# Patient Record
Sex: Male | Born: 1940 | Race: White | Hispanic: No | Smoking: Former smoker
Health system: Southern US, Community
[De-identification: ages and names within clinical notes are randomized; demographics above are authoritative.]

## PROBLEM LIST (undated history)

## (undated) DIAGNOSIS — M6281 Muscle weakness (generalized): Secondary | ICD-10-CM

## (undated) DIAGNOSIS — R05 Cough: Secondary | ICD-10-CM

## (undated) DIAGNOSIS — F32A Depression, unspecified: Secondary | ICD-10-CM

## (undated) DIAGNOSIS — K5900 Constipation, unspecified: Secondary | ICD-10-CM

## (undated) DIAGNOSIS — R1110 Vomiting, unspecified: Secondary | ICD-10-CM

## (undated) DIAGNOSIS — C801 Malignant (primary) neoplasm, unspecified: Secondary | ICD-10-CM

## (undated) DIAGNOSIS — R32 Unspecified urinary incontinence: Secondary | ICD-10-CM

## (undated) DIAGNOSIS — R062 Wheezing: Secondary | ICD-10-CM

## (undated) DIAGNOSIS — R059 Cough, unspecified: Secondary | ICD-10-CM

## (undated) DIAGNOSIS — I639 Cerebral infarction, unspecified: Secondary | ICD-10-CM

## (undated) DIAGNOSIS — I10 Essential (primary) hypertension: Secondary | ICD-10-CM

## (undated) DIAGNOSIS — E559 Vitamin D deficiency, unspecified: Secondary | ICD-10-CM

## (undated) DIAGNOSIS — K469 Unspecified abdominal hernia without obstruction or gangrene: Secondary | ICD-10-CM

## (undated) DIAGNOSIS — H9190 Unspecified hearing loss, unspecified ear: Secondary | ICD-10-CM

## (undated) DIAGNOSIS — R682 Dry mouth, unspecified: Secondary | ICD-10-CM

## (undated) DIAGNOSIS — F329 Major depressive disorder, single episode, unspecified: Secondary | ICD-10-CM

## (undated) HISTORY — PX: ABDOMINAL SURGERY: SHX537

---

## 2018-08-28 ENCOUNTER — Emergency Department (HOSPITAL_COMMUNITY): Payer: Medicare Other

## 2018-08-28 ENCOUNTER — Other Ambulatory Visit: Payer: Self-pay

## 2018-08-28 ENCOUNTER — Encounter (HOSPITAL_COMMUNITY): Payer: Self-pay | Admitting: Emergency Medicine

## 2018-08-28 ENCOUNTER — Inpatient Hospital Stay (HOSPITAL_COMMUNITY)
Admission: EM | Admit: 2018-08-28 | Discharge: 2018-09-13 | DRG: 335 | Disposition: A | Discharge status: Skilled Nursing Facility | Payer: Medicare Other | Attending: Internal Medicine | Admitting: Internal Medicine

## 2018-08-28 DIAGNOSIS — N179 Acute kidney failure, unspecified: Secondary | ICD-10-CM | POA: Diagnosis present

## 2018-08-28 DIAGNOSIS — Z85820 Personal history of malignant melanoma of skin: Secondary | ICD-10-CM | POA: Diagnosis not present

## 2018-08-28 DIAGNOSIS — K567 Ileus, unspecified: Secondary | ICD-10-CM | POA: Diagnosis not present

## 2018-08-28 DIAGNOSIS — K432 Incisional hernia without obstruction or gangrene: Secondary | ICD-10-CM | POA: Diagnosis present

## 2018-08-28 DIAGNOSIS — Z79899 Other long term (current) drug therapy: Secondary | ICD-10-CM

## 2018-08-28 DIAGNOSIS — M795 Residual foreign body in soft tissue: Secondary | ICD-10-CM

## 2018-08-28 DIAGNOSIS — K660 Peritoneal adhesions (postprocedural) (postinfection): Secondary | ICD-10-CM | POA: Diagnosis present

## 2018-08-28 DIAGNOSIS — Z923 Personal history of irradiation: Secondary | ICD-10-CM | POA: Diagnosis not present

## 2018-08-28 DIAGNOSIS — Z85818 Personal history of malignant neoplasm of other sites of lip, oral cavity, and pharynx: Secondary | ICD-10-CM | POA: Diagnosis not present

## 2018-08-28 DIAGNOSIS — Z8546 Personal history of malignant neoplasm of prostate: Secondary | ICD-10-CM

## 2018-08-28 DIAGNOSIS — Z87891 Personal history of nicotine dependence: Secondary | ICD-10-CM

## 2018-08-28 DIAGNOSIS — R1310 Dysphagia, unspecified: Secondary | ICD-10-CM | POA: Diagnosis present

## 2018-08-28 DIAGNOSIS — D649 Anemia, unspecified: Secondary | ICD-10-CM | POA: Diagnosis present

## 2018-08-28 DIAGNOSIS — Z4659 Encounter for fitting and adjustment of other gastrointestinal appliance and device: Secondary | ICD-10-CM

## 2018-08-28 DIAGNOSIS — Z7951 Long term (current) use of inhaled steroids: Secondary | ICD-10-CM

## 2018-08-28 DIAGNOSIS — R0902 Hypoxemia: Secondary | ICD-10-CM | POA: Diagnosis present

## 2018-08-28 DIAGNOSIS — Z66 Do not resuscitate: Secondary | ICD-10-CM | POA: Diagnosis present

## 2018-08-28 DIAGNOSIS — J440 Chronic obstructive pulmonary disease with acute lower respiratory infection: Secondary | ICD-10-CM | POA: Diagnosis present

## 2018-08-28 DIAGNOSIS — E860 Dehydration: Secondary | ICD-10-CM | POA: Diagnosis present

## 2018-08-28 DIAGNOSIS — Z87440 Personal history of urinary (tract) infections: Secondary | ICD-10-CM | POA: Diagnosis not present

## 2018-08-28 DIAGNOSIS — R062 Wheezing: Secondary | ICD-10-CM

## 2018-08-28 DIAGNOSIS — Z9079 Acquired absence of other genital organ(s): Secondary | ICD-10-CM

## 2018-08-28 DIAGNOSIS — D72829 Elevated white blood cell count, unspecified: Secondary | ICD-10-CM | POA: Diagnosis not present

## 2018-08-28 DIAGNOSIS — K56600 Partial intestinal obstruction, unspecified as to cause: Secondary | ICD-10-CM | POA: Diagnosis present

## 2018-08-28 DIAGNOSIS — J690 Pneumonitis due to inhalation of food and vomit: Secondary | ICD-10-CM | POA: Diagnosis present

## 2018-08-28 DIAGNOSIS — J209 Acute bronchitis, unspecified: Secondary | ICD-10-CM | POA: Diagnosis present

## 2018-08-28 DIAGNOSIS — H9190 Unspecified hearing loss, unspecified ear: Secondary | ICD-10-CM | POA: Diagnosis present

## 2018-08-28 DIAGNOSIS — R05 Cough: Secondary | ICD-10-CM

## 2018-08-28 DIAGNOSIS — K56609 Unspecified intestinal obstruction, unspecified as to partial versus complete obstruction: Secondary | ICD-10-CM | POA: Diagnosis present

## 2018-08-28 DIAGNOSIS — R059 Cough, unspecified: Secondary | ICD-10-CM

## 2018-08-28 DIAGNOSIS — J189 Pneumonia, unspecified organism: Secondary | ICD-10-CM | POA: Diagnosis not present

## 2018-08-28 DIAGNOSIS — I69354 Hemiplegia and hemiparesis following cerebral infarction affecting left non-dominant side: Secondary | ICD-10-CM

## 2018-08-28 DIAGNOSIS — I10 Essential (primary) hypertension: Secondary | ICD-10-CM | POA: Diagnosis present

## 2018-08-28 DIAGNOSIS — Z0189 Encounter for other specified special examinations: Secondary | ICD-10-CM

## 2018-08-28 DIAGNOSIS — Z7982 Long term (current) use of aspirin: Secondary | ICD-10-CM

## 2018-08-28 DIAGNOSIS — R109 Unspecified abdominal pain: Secondary | ICD-10-CM | POA: Diagnosis present

## 2018-08-28 HISTORY — DX: Unspecified urinary incontinence: R32

## 2018-08-28 HISTORY — DX: Cough: R05

## 2018-08-28 HISTORY — DX: Major depressive disorder, single episode, unspecified: F32.9

## 2018-08-28 HISTORY — DX: Cough, unspecified: R05.9

## 2018-08-28 HISTORY — DX: Cerebral infarction, unspecified: I63.9

## 2018-08-28 HISTORY — DX: Dry mouth, unspecified: R68.2

## 2018-08-28 HISTORY — DX: Malignant (primary) neoplasm, unspecified: C80.1

## 2018-08-28 HISTORY — DX: Wheezing: R06.2

## 2018-08-28 HISTORY — DX: Vomiting, unspecified: R11.10

## 2018-08-28 HISTORY — DX: Muscle weakness (generalized): M62.81

## 2018-08-28 HISTORY — DX: Vitamin D deficiency, unspecified: E55.9

## 2018-08-28 HISTORY — DX: Depression, unspecified: F32.A

## 2018-08-28 HISTORY — DX: Essential (primary) hypertension: I10

## 2018-08-28 HISTORY — DX: Unspecified hearing loss, unspecified ear: H91.90

## 2018-08-28 HISTORY — DX: Constipation, unspecified: K59.00

## 2018-08-28 HISTORY — DX: Unspecified abdominal hernia without obstruction or gangrene: K46.9

## 2018-08-28 LAB — COMPREHENSIVE METABOLIC PANEL
ALT: 13 U/L (ref 0–44)
ANION GAP: 7 (ref 5–15)
AST: 16 U/L (ref 15–41)
Albumin: 3.2 g/dL — ABNORMAL LOW (ref 3.5–5.0)
Alkaline Phosphatase: 76 U/L (ref 38–126)
BUN: 17 mg/dL (ref 8–23)
CO2: 25 mmol/L (ref 22–32)
CREATININE: 1.35 mg/dL — AB (ref 0.61–1.24)
Calcium: 9 mg/dL (ref 8.9–10.3)
Chloride: 104 mmol/L (ref 98–111)
GFR calc Af Amer: 59 mL/min — ABNORMAL LOW (ref >60)
GFR calc non Af Amer: 51 mL/min — ABNORMAL LOW (ref >60)
Glucose, Bld: 184 mg/dL — ABNORMAL HIGH (ref 70–99)
Potassium: 4.2 mmol/L (ref 3.5–5.1)
SODIUM: 136 mmol/L (ref 135–145)
Total Bilirubin: 0.8 mg/dL (ref 0.3–1.2)
Total Protein: 7.8 g/dL (ref 6.5–8.1)

## 2018-08-28 LAB — CBC WITH DIFFERENTIAL/PLATELET
Abs Immature Granulocytes: 0.07 10*3/uL (ref 0.00–0.07)
BASOS ABS: 0 10*3/uL (ref 0.0–0.1)
Basophils Relative: 0 %
EOS PCT: 0 %
Eosinophils Absolute: 0 10*3/uL (ref 0.0–0.5)
HCT: 41.5 % (ref 39.0–52.0)
Hemoglobin: 12.8 g/dL — ABNORMAL LOW (ref 13.0–17.0)
IMMATURE GRANULOCYTES: 0 %
LYMPHS PCT: 7 %
Lymphs Abs: 1.2 10*3/uL (ref 0.7–4.0)
MCH: 27.6 pg (ref 26.0–34.0)
MCHC: 30.8 g/dL (ref 30.0–36.0)
MCV: 89.6 fL (ref 80.0–100.0)
Monocytes Absolute: 0.7 10*3/uL (ref 0.1–1.0)
Monocytes Relative: 5 %
NEUTROS PCT: 88 %
NRBC: 0 % (ref 0.0–0.2)
Neutro Abs: 13.7 10*3/uL — ABNORMAL HIGH (ref 1.7–7.7)
Platelets: 346 10*3/uL (ref 150–400)
RBC: 4.63 MIL/uL (ref 4.22–5.81)
RDW: 14.1 % (ref 11.5–15.5)
WBC: 15.7 10*3/uL — AB (ref 4.0–10.5)

## 2018-08-28 LAB — LIPASE, BLOOD: LIPASE: 25 U/L (ref 11–51)

## 2018-08-28 LAB — I-STAT CG4 LACTIC ACID, ED: Lactic Acid, Venous: 1.86 mmol/L (ref 0.5–1.9)

## 2018-08-28 MED ORDER — SODIUM CHLORIDE 0.9 % IV SOLN
2.0000 g | INTRAVENOUS | Status: AC
  Administered 2018-08-28 – 2018-09-03 (×7): 2 g via INTRAVENOUS
  Filled 2018-08-28 (×7): qty 20

## 2018-08-28 MED ORDER — SODIUM CHLORIDE 0.9 % IV SOLN
500.0000 mg | INTRAVENOUS | Status: AC
  Administered 2018-08-28 – 2018-08-31 (×3): 500 mg via INTRAVENOUS
  Filled 2018-08-28 (×4): qty 500

## 2018-08-28 MED ORDER — ONDANSETRON HCL 4 MG/2ML IJ SOLN
4.0000 mg | Freq: Once | INTRAMUSCULAR | Status: AC
  Administered 2018-08-28: 4 mg via INTRAVENOUS
  Filled 2018-08-28: qty 2

## 2018-08-28 MED ORDER — SODIUM CHLORIDE 0.9 % IV BOLUS
1000.0000 mL | Freq: Once | INTRAVENOUS | Status: AC
  Administered 2018-08-28: 1000 mL via INTRAVENOUS

## 2018-08-28 MED ORDER — IOHEXOL 300 MG/ML  SOLN
100.0000 mL | Freq: Once | INTRAMUSCULAR | Status: AC | PRN
  Administered 2018-08-28: 100 mL via INTRAVENOUS

## 2018-08-28 MED ORDER — ALBUTEROL SULFATE (2.5 MG/3ML) 0.083% IN NEBU
5.0000 mg | INHALATION_SOLUTION | Freq: Once | RESPIRATORY_TRACT | Status: AC
  Administered 2018-08-28: 5 mg via RESPIRATORY_TRACT
  Filled 2018-08-28: qty 6

## 2018-08-28 MED ORDER — IPRATROPIUM BROMIDE 0.02 % IN SOLN
0.5000 mg | Freq: Once | RESPIRATORY_TRACT | Status: AC
  Administered 2018-08-28: 0.5 mg via RESPIRATORY_TRACT
  Filled 2018-08-28: qty 2.5

## 2018-08-28 NOTE — ED Triage Notes (Signed)
Pt to ED with c/o abd pain and shortness of breath.  EMS reports pt was coughing en route to ED and coughed until he vomited.  Pt was given a duo neb by EMS.

## 2018-08-28 NOTE — ED Provider Notes (Addendum)
[facility] EMERGENCY DEPARTMENT Provider Note   CSN: [id number] Arrival date & time: 08/28/18  2109     History   Chief Complaint Chief Complaint  Patient presents with  . Abdominal Pain  . Shortness of Breath    HPI Luis Lyons is a 77 y.o. male.  Patient c/o abdominal pain for the past couple days. Constant, gradual onset, dull, diffuse, non radiating, worse w palpation. Pt also noted w fever today, 101. Pt notes episodic, productive cough. No sore throat. Denies chest pain. Mild sob. Last bm a few days ago. Denies vomiting. No dysuria.   The history is provided by the patient and the EMS personnel.  Abdominal Pain   Associated symptoms include fever. Pertinent negatives include dysuria and headaches.  Shortness of Breath  Associated symptoms include a fever, cough and abdominal pain. Pertinent negatives include no headaches, no sore throat, no neck pain, no chest pain and no rash.    Past Medical History:  Diagnosis Date  . Abdominal hernia   . Cancer ([place])   . Constipation   . Cough   . Depression   . Dry mouth   . Hard of hearing   . Hypertension   . Muscle weakness   . Stroke ([place])   . Urinary incontinence   . Vitamin D deficiency   . Vomiting   . Wheezing     There are no active problems to display for this patient.   Past Surgical History:  Procedure Laterality Date  . ABDOMINAL SURGERY          Home Medications    Prior to Admission medications   Medication Sig Start Date End Date Taking? Authorizing Provider  acetaminophen (TYLENOL) 500 MG tablet Take 500 mg by mouth every 4 (four) hours as needed for mild pain.   Yes [provider]  albuterol (PROVENTIL) (2.5 MG/3ML) 0.083% nebulizer solution Take 2.5 mg by nebulization 2 (two) times daily as needed for wheezing or shortness of breath.   Yes [provider]  aspirin 81 MG chewable tablet Chew 81 mg by mouth daily.   Yes [provider]    chlorhexidine (PERIDEX) 0.12 % solution 15 mLs by Mouth Rinse route daily.   Yes [provider]  cholecalciferol (VITAMIN D3) 25 MCG (1000 UT) tablet Take 1,000 Units by mouth daily.   Yes [provider]  dextromethorphan (DELSYM) 30 MG/5ML liquid Take 60 mg by mouth 2 (two) times daily as needed for cough.   Yes [provider]  fluticasone (FLONASE) 50 MCG/ACT nasal spray Place 1 spray into both nostrils daily.   Yes [provider]  HYDROcodone-acetaminophen (NORCO/VICODIN) 5-325 MG tablet Take 1 tablet by mouth every 4 (four) hours as needed for moderate pain.   Yes [provider]  ibuprofen (ADVIL,MOTRIN) 200 MG tablet Take 400 mg by mouth every 6 (six) hours as needed for moderate pain.   Yes [provider]  ipratropium-albuterol (DUONEB) 0.5-2.5 (3) MG/3ML SOLN Take 3 mLs by nebulization 4 (four) times daily.    Yes [provider]  Lactobacillus (ACIDOPHILUS) CAPS capsule Take 2 capsules by mouth 2 (two) times daily.   Yes [provider]  loperamide (IMODIUM A-D) 2 MG tablet Take 2 mg by mouth 4 (four) times daily as needed for diarrhea or loose stools.   Yes [provider]  magnesium hydroxide (MILK OF MAGNESIA) 400 MG/5ML suspension Take 30 mLs by mouth daily as needed for mild constipation.  Yes [provider]  oxybutynin (DITROPAN-XL) 5 MG 24 hr tablet Take 5 mg by mouth at bedtime.   Yes [provider]  phenazopyridine (PYRIDIUM) 200 MG tablet Take 200 mg by mouth 3 (three) times daily as needed for pain.   Yes [provider]  polyethylene glycol (MIRALAX / GLYCOLAX) packet Take 17 g by mouth daily.   Yes [provider]  polyvinyl alcohol (LIQUIFILM TEARS) 1.4 % ophthalmic solution Place 1 drop into both eyes 2 (two) times daily.   Yes [provider]  senna (SENOKOT) 8.6 MG tablet Take 1 tablet by mouth 2 (two) times daily as needed for constipation.    Yes [provider]  sodium fluoride (DENTAGEL) 1.1 % GEL dental gel Place 1 application onto teeth 2 (two) times daily.   Yes [provider]  vitamin B-12 (CYANOCOBALAMIN) 1000 MCG tablet Take 1,000 mcg by mouth daily.   Yes [provider]  zinc oxide 20 % ointment Apply 1 application topically 2 (two) times daily as needed for irritation.   Yes [provider]    Family History No family history on file.  Social History Social History   Tobacco Use  . Smoking status: Former Research scientist (life sciences)  . Smokeless tobacco: Never Used  Substance Use Topics  . Alcohol use: Not Currently  . Drug use: Never     Allergies   Patient has no known allergies.   Review of Systems Review of Systems  Constitutional: Positive for fever.  HENT: Negative for sore throat.   Eyes: Negative for redness.  Respiratory: Positive for cough and shortness of breath.   Cardiovascular: Negative for chest pain.  Gastrointestinal: Positive for abdominal pain.  Endocrine: Negative for polyuria.  Genitourinary: Negative for dysuria and flank pain.  Musculoskeletal: Negative for back pain and neck pain.  Skin: Negative for rash.  Neurological: Negative for headaches.  Hematological: Does not bruise/bleed easily.  Psychiatric/Behavioral: Negative for confusion.     Physical Exam Updated Vital Signs BP (!) 164/71   Pulse (!) 107   Temp 99.1 F (37.3 C) (Oral)   Resp (!) 31   Ht 1.854 m (6\' 1" )   Wt 90.7 kg   SpO2 96%   BMI 26.39 kg/m   Physical Exam  Constitutional: He appears well-developed and well-nourished.  HENT:  Mouth/Throat: Oropharynx is clear and moist.  Eyes: Conjunctivae are normal.  Neck: Neck supple. No tracheal deviation present.  No stiffness or rigidity.   Cardiovascular: Normal rate, regular rhythm, normal heart sounds and intact distal pulses. Exam reveals no gallop and no friction rub.  No murmur heard. Pulmonary/Chest: Effort normal. No  accessory muscle usage. No respiratory distress.  Upper resp congestion. Non prod cough.   Abdominal: Soft.  abd soft, distended. Soft, reducible ventral hernia. Diffuse tenderness, mild-mod. No rebound/guarding.   Genitourinary:  Genitourinary Comments: No cva tenderness.   Musculoskeletal: He exhibits no edema.  Neurological: He is alert.  Skin: Skin is warm and dry. No rash noted.  Psychiatric: He has a normal mood and affect.  Nursing note and vitals reviewed.    ED Treatments / Results  Labs (all labs ordered are listed, but only abnormal results are displayed) Results for orders placed or performed during the hospital encounter of 08/28/18  Comprehensive metabolic panel  Result Value Ref Range   Sodium 136 135 - 145 mmol/L   Potassium 4.2 3.5 - 5.1 mmol/L   Chloride 104 98 - 111 mmol/L   CO2 25  22 - 32 mmol/L   Glucose, Bld 184 (H) 70 - 99 mg/dL   BUN 17 8 - 23 mg/dL   Creatinine, Ser 1.35 (H) 0.61 - 1.24 mg/dL   Calcium 9.0 8.9 - 10.3 mg/dL   Total Protein 7.8 6.5 - 8.1 g/dL   Albumin 3.2 (L) 3.5 - 5.0 g/dL   AST 16 15 - 41 U/L   ALT 13 0 - 44 U/L   Alkaline Phosphatase 76 38 - 126 U/L   Total Bilirubin 0.8 0.3 - 1.2 mg/dL   GFR calc non Af Amer 51 (L) >60 mL/min   GFR calc Af Amer 59 (L) >60 mL/min   Anion gap 7 5 - 15  CBC WITH DIFFERENTIAL  Result Value Ref Range   WBC 15.7 (H) 4.0 - 10.5 K/uL   RBC 4.63 4.22 - 5.81 MIL/uL   Hemoglobin 12.8 (L) 13.0 - 17.0 g/dL   HCT 41.5 39.0 - 52.0 %   MCV 89.6 80.0 - 100.0 fL   MCH 27.6 26.0 - 34.0 pg   MCHC 30.8 30.0 - 36.0 g/dL   RDW 14.1 11.5 - 15.5 %   Platelets 346 150 - 400 K/uL   nRBC 0.0 0.0 - 0.2 %   Neutrophils Relative % 88 %   Neutro Abs 13.7 (H) 1.7 - 7.7 K/uL   Lymphocytes Relative 7 %   Lymphs Abs 1.2 0.7 - 4.0 K/uL   Monocytes Relative 5 %   Monocytes Absolute 0.7 0.1 - 1.0 K/uL   Eosinophils Relative 0 %   Eosinophils Absolute 0.0 0.0 - 0.5 K/uL   Basophils Relative 0 %   Basophils Absolute 0.0  0.0 - 0.1 K/uL   Immature Granulocytes 0 %   Abs Immature Granulocytes 0.07 0.00 - 0.07 K/uL  Lipase, blood  Result Value Ref Range   Lipase 25 11 - 51 U/L  I-Stat CG4 Lactic Acid, ED  Result Value Ref Range   Lactic Acid, Venous 1.86 0.5 - 1.9 mmol/L   Ct Abdomen Pelvis W Contrast  Result Date: 08/28/2018 CLINICAL DATA:  Abdominal pain with shortness of breath EXAM: CT ABDOMEN AND PELVIS WITH CONTRAST TECHNIQUE: Multidetector CT imaging of the abdomen and pelvis was performed using the standard protocol following bolus administration of intravenous contrast. CONTRAST:  11mL OMNIPAQUE IOHEXOL 300 MG/ML  SOLN COMPARISON:  None. FINDINGS: LOWER CHEST: There is no basilar pleural or apical pericardial effusion. HEPATOBILIARY: The hepatic contours and density are normal. There is no intra- or extrahepatic biliary dilatation. There is cholelithiasis without acute inflammation. PANCREAS: The pancreatic parenchymal contours are normal and there is no ductal dilatation. There is no peripancreatic fluid collection. SPLEEN: Normal. ADRENALS/URINARY TRACT: --Adrenal glands: Normal. --Right kidney/ureter: Mild atrophy. No hydronephrosis. --Left kidney/ureter: Vascular calcifications and interpolar nonobstructive 3 mm nephrolithiasis. No hydronephrosis. --Urinary bladder: Mild bladder wall thickening, possibly due to underdistention. STOMACH/BOWEL: --Stomach/Duodenum: There is no hiatal hernia or other gastric abnormality. The duodenal course and caliber are normal. --Small bowel: There are multiple loops of dilated small bowel in the central abdomen, predominantly anteriorly. One of these loops is directly beneath the skin surface, extending through a diastatic defect of the anterior abdominal muscles. Suspected transition point is in the anterior low midline abdomen (coronal image 24 and sagittal image 70). --Colon: No focal abnormality. --Appendix: Not visualized. No right lower quadrant inflammation or free  fluid. VASCULAR/LYMPHATIC: Atherosclerotic calcification is present within the non-aneurysmal abdominal aorta, without hemodynamically significant stenosis. The portal vein, splenic vein, superior mesenteric vein  and IVC are patent. No abdominal or pelvic lymphadenopathy. REPRODUCTIVE: Normal prostate size with symmetric seminal vesicles. MUSCULOSKELETAL. Compression deformity of L2 is probably chronic. There is grade 1 L4-5 anterolisthesis secondary to facet arthrosis. OTHER: None. IMPRESSION: 1. Small bowel obstruction with suspected transition point in the low anterior midline abdomen. 2. Cholelithiasis without acute inflammation. 3. Nonobstructing left nephrolithiasis. Aortic Atherosclerosis (ICD10-I70.0). Electronically Signed   By: Ulyses Jarred M.D.   On: 08/28/2018 23:21   Dg Chest Port 1 View  Result Date: 08/28/2018 CLINICAL DATA:  77 y/o  M; abdominal pain and shortness of breath. EXAM: PORTABLE CHEST 1 VIEW COMPARISON:  None. FINDINGS: Normal cardiac silhouette. Aortic atherosclerosis with calcification. Perihilar and basilar reticular opacities. No consolidation. No pleural effusion or pneumothorax. No acute osseous abnormality is evident. IMPRESSION: Basilar reticular opacities may represent mild interstitial edema or atypical pneumonia. No consolidation. Electronically Signed   By: Kristine Garbe M.D.   On: 08/28/2018 21:55     EKG EKG Interpretation  Date/Time:  Tuesday August 28 2018 21:21:31 EST Ventricular Rate:  116 PR Interval:    QRS Duration: 103 QT Interval:  321 QTC Calculation: 446 R Axis:   -30 Text Interpretation:  Sinus tachycardia Nonspecific T wave abnormality No previous tracing Confirmed by Lajean Saver [phone]) on 08/28/2018 9:33:36 PM   Radiology Ct Abdomen Pelvis W Contrast  Result Date: 08/28/2018 CLINICAL DATA:  Abdominal pain with shortness of breath EXAM: CT ABDOMEN AND PELVIS WITH CONTRAST TECHNIQUE: Multidetector CT imaging of the  abdomen and pelvis was performed using the standard protocol following bolus administration of intravenous contrast. CONTRAST:  161mL OMNIPAQUE IOHEXOL 300 MG/ML  SOLN COMPARISON:  None. FINDINGS: LOWER CHEST: There is no basilar pleural or apical pericardial effusion. HEPATOBILIARY: The hepatic contours and density are normal. There is no intra- or extrahepatic biliary dilatation. There is cholelithiasis without acute inflammation. PANCREAS: The pancreatic parenchymal contours are normal and there is no ductal dilatation. There is no peripancreatic fluid collection. SPLEEN: Normal. ADRENALS/URINARY TRACT: --Adrenal glands: Normal. --Right kidney/ureter: Mild atrophy. No hydronephrosis. --Left kidney/ureter: Vascular calcifications and interpolar nonobstructive 3 mm nephrolithiasis. No hydronephrosis. --Urinary bladder: Mild bladder wall thickening, possibly due to underdistention. STOMACH/BOWEL: --Stomach/Duodenum: There is no hiatal hernia or other gastric abnormality. The duodenal course and caliber are normal. --Small bowel: There are multiple loops of dilated small bowel in the central abdomen, predominantly anteriorly. One of these loops is directly beneath the skin surface, extending through a diastatic defect of the anterior abdominal muscles. Suspected transition point is in the anterior low midline abdomen (coronal image 24 and sagittal image 70). --Colon: No focal abnormality. --Appendix: Not visualized. No right lower quadrant inflammation or free fluid. VASCULAR/LYMPHATIC: Atherosclerotic calcification is present within the non-aneurysmal abdominal aorta, without hemodynamically significant stenosis. The portal vein, splenic vein, superior mesenteric vein and IVC are patent. No abdominal or pelvic lymphadenopathy. REPRODUCTIVE: Normal prostate size with symmetric seminal vesicles. MUSCULOSKELETAL. Compression deformity of L2 is probably chronic. There is grade 1 L4-5 anterolisthesis secondary to facet  arthrosis. OTHER: None. IMPRESSION: 1. Small bowel obstruction with suspected transition point in the low anterior midline abdomen. 2. Cholelithiasis without acute inflammation. 3. Nonobstructing left nephrolithiasis. Aortic Atherosclerosis (ICD10-I70.0). Electronically Signed   By: Ulyses Jarred M.D.   On: 08/28/2018 23:21   Dg Chest Port 1 View  Result Date: 08/28/2018 CLINICAL DATA:  77 y/o  M; abdominal pain and shortness of breath. EXAM: PORTABLE CHEST 1 VIEW COMPARISON:  None. FINDINGS: Normal cardiac silhouette. Aortic atherosclerosis  with calcification. Perihilar and basilar reticular opacities. No consolidation. No pleural effusion or pneumothorax. No acute osseous abnormality is evident. IMPRESSION: Basilar reticular opacities may represent mild interstitial edema or atypical pneumonia. No consolidation. Electronically Signed   By: Kristine Garbe M.D.   On: 08/28/2018 21:55    Procedures Procedures (including critical care time)  Medications Ordered in ED Medications  cefTRIAXone (ROCEPHIN) 2 g in sodium chloride 0.9 % 100 mL IVPB (2 g Intravenous New Bag/Given 08/28/18 2231)  azithromycin (ZITHROMAX) 500 mg in sodium chloride 0.9 % 250 mL IVPB (has no administration in time range)  sodium chloride 0.9 % bolus 1,000 mL (has no administration in time range)  iohexol (OMNIPAQUE) 300 MG/ML solution 100 mL (100 mLs Intravenous Contrast Given 08/28/18 2249)     Initial Impression / Assessment and Plan / ED Course  I have reviewed the triage vital signs and the nursing notes.  Pertinent labs & imaging results that were available during my care of the patient were reviewed by me and considered in my medical decision making (see chart for details).  Iv ns bolus. zofran iv. Labs and cultures. Pcxr.   Ct.  Reviewed nursing notes and prior charts for additional history.   Labs reviewed - wbc elevated.   cxr reviewed - ?possible pna. Iv abx given.  Ct reviewed  - sbo. gen  surgery consulted.   bil wheezing on exam, hx same, alb and atrovent neb.   Hospitalists consulted for admission.  Discussed pt with surgery, Dr Barry Dienes - she will consult, requests ng and med service admission.  NG to LIWS.   CRITICAL CARE RE: acute small bowel obstruction requiring ivf, NG tube, emergent gen surg consultation, fever, pna.  Performed by: Mirna Mires Total critical care time: 35 minutes Critical care time was exclusive of separately billable procedures and treating other patients. Critical care was necessary to treat or prevent imminent or life-threatening deterioration. Critical care was time spent personally by me on the following activities: development of treatment plan with patient and/or surrogate as well as nursing, discussions with consultants, evaluation of patient's response to treatment, examination of patient, obtaining history from patient or surrogate, ordering and performing treatments and interventions, ordering and review of laboratory studies, ordering and review of radiographic studies, pulse oximetry and re-evaluation of patient's condition.   Hospitalist consulted for admission.     Final Clinical Impressions(s) / ED Diagnoses   Final diagnoses:  None    ED Discharge Orders    None                Lajean Saver, MD 09/24/18 1257

## 2018-08-28 NOTE — ED Notes (Signed)
Paged triad, Nicolina Hirt 23:37

## 2018-08-29 ENCOUNTER — Other Ambulatory Visit: Payer: Self-pay

## 2018-08-29 ENCOUNTER — Inpatient Hospital Stay (HOSPITAL_COMMUNITY): Payer: Medicare Other

## 2018-08-29 ENCOUNTER — Encounter (HOSPITAL_COMMUNITY): Payer: Self-pay | Admitting: Internal Medicine

## 2018-08-29 DIAGNOSIS — K56609 Unspecified intestinal obstruction, unspecified as to partial versus complete obstruction: Secondary | ICD-10-CM

## 2018-08-29 DIAGNOSIS — N179 Acute kidney failure, unspecified: Secondary | ICD-10-CM

## 2018-08-29 DIAGNOSIS — J189 Pneumonia, unspecified organism: Secondary | ICD-10-CM | POA: Diagnosis present

## 2018-08-29 DIAGNOSIS — J209 Acute bronchitis, unspecified: Secondary | ICD-10-CM | POA: Diagnosis present

## 2018-08-29 DIAGNOSIS — I10 Essential (primary) hypertension: Secondary | ICD-10-CM | POA: Diagnosis present

## 2018-08-29 LAB — HEPATIC FUNCTION PANEL
ALBUMIN: 3.1 g/dL — AB (ref 3.5–5.0)
ALT: 13 U/L (ref 0–44)
AST: 19 U/L (ref 15–41)
Alkaline Phosphatase: 71 U/L (ref 38–126)
BILIRUBIN DIRECT: 0.1 mg/dL (ref 0.0–0.2)
Indirect Bilirubin: 0.5 mg/dL (ref 0.3–0.9)
Total Bilirubin: 0.6 mg/dL (ref 0.3–1.2)
Total Protein: 7.5 g/dL (ref 6.5–8.1)

## 2018-08-29 LAB — CBC
HEMATOCRIT: 41.2 % (ref 39.0–52.0)
Hemoglobin: 13.1 g/dL (ref 13.0–17.0)
MCH: 28.3 pg (ref 26.0–34.0)
MCHC: 31.8 g/dL (ref 30.0–36.0)
MCV: 89 fL (ref 80.0–100.0)
NRBC: 0 % (ref 0.0–0.2)
PLATELETS: 315 10*3/uL (ref 150–400)
RBC: 4.63 MIL/uL (ref 4.22–5.81)
RDW: 14.1 % (ref 11.5–15.5)
WBC: 12.8 10*3/uL — AB (ref 4.0–10.5)

## 2018-08-29 LAB — BASIC METABOLIC PANEL
Anion gap: 8 (ref 5–15)
BUN: 18 mg/dL (ref 8–23)
CO2: 23 mmol/L (ref 22–32)
Calcium: 8.8 mg/dL — ABNORMAL LOW (ref 8.9–10.3)
Chloride: 106 mmol/L (ref 98–111)
Creatinine, Ser: 1.28 mg/dL — ABNORMAL HIGH (ref 0.61–1.24)
GFR calc non Af Amer: 54 mL/min — ABNORMAL LOW (ref >60)
Glucose, Bld: 155 mg/dL — ABNORMAL HIGH (ref 70–99)
POTASSIUM: 4.1 mmol/L (ref 3.5–5.1)
SODIUM: 137 mmol/L (ref 135–145)

## 2018-08-29 LAB — MRSA PCR SCREENING: MRSA by PCR: NEGATIVE

## 2018-08-29 MED ORDER — HYDRALAZINE HCL 20 MG/ML IJ SOLN
5.0000 mg | INTRAMUSCULAR | Status: DC | PRN
  Administered 2018-08-29 – 2018-09-05 (×12): 5 mg via INTRAVENOUS
  Filled 2018-08-29 (×13): qty 1

## 2018-08-29 MED ORDER — DIATRIZOATE MEGLUMINE & SODIUM 66-10 % PO SOLN
90.0000 mL | Freq: Once | ORAL | Status: AC
  Administered 2018-08-29: 90 mL via NASOGASTRIC
  Filled 2018-08-29: qty 90

## 2018-08-29 MED ORDER — IPRATROPIUM BROMIDE 0.02 % IN SOLN
0.5000 mg | Freq: Four times a day (QID) | RESPIRATORY_TRACT | Status: DC
  Administered 2018-08-29: 0.5 mg via RESPIRATORY_TRACT
  Filled 2018-08-29: qty 2.5

## 2018-08-29 MED ORDER — ORAL CARE MOUTH RINSE
15.0000 mL | Freq: Two times a day (BID) | OROMUCOSAL | Status: DC
  Administered 2018-08-29 – 2018-09-13 (×26): 15 mL via OROMUCOSAL

## 2018-08-29 MED ORDER — ONDANSETRON HCL 4 MG PO TABS
4.0000 mg | ORAL_TABLET | Freq: Four times a day (QID) | ORAL | Status: DC | PRN
  Administered 2018-08-29: 4 mg via ORAL

## 2018-08-29 MED ORDER — LIDOCAINE VISCOUS HCL 2 % MT SOLN
15.0000 mL | Freq: Once | OROMUCOSAL | Status: AC
  Administered 2018-08-29: 5 mL via OROMUCOSAL
  Filled 2018-08-29: qty 15

## 2018-08-29 MED ORDER — MORPHINE SULFATE (PF) 2 MG/ML IV SOLN
2.0000 mg | INTRAVENOUS | Status: DC | PRN
  Administered 2018-08-29 – 2018-09-04 (×2): 2 mg via INTRAVENOUS
  Filled 2018-08-29 (×2): qty 1

## 2018-08-29 MED ORDER — ALBUTEROL SULFATE (2.5 MG/3ML) 0.083% IN NEBU
2.5000 mg | INHALATION_SOLUTION | RESPIRATORY_TRACT | Status: DC | PRN
  Administered 2018-08-29: 2.5 mg via RESPIRATORY_TRACT
  Filled 2018-08-29: qty 3

## 2018-08-29 MED ORDER — ACETAMINOPHEN 325 MG PO TABS: 650.0000 mg | ORAL_TABLET | Freq: Four times a day (QID) | ORAL | Status: DC | PRN

## 2018-08-29 MED ORDER — SODIUM CHLORIDE 0.9 % IV SOLN
INTRAVENOUS | Status: DC
  Administered 2018-08-29: 02:00:00 via INTRAVENOUS

## 2018-08-29 MED ORDER — IPRATROPIUM-ALBUTEROL 0.5-2.5 (3) MG/3ML IN SOLN: 3.0000 mL | Freq: Four times a day (QID) | RESPIRATORY_TRACT | Status: DC

## 2018-08-29 MED ORDER — ONDANSETRON HCL 4 MG/2ML IJ SOLN
4.0000 mg | Freq: Four times a day (QID) | INTRAMUSCULAR | Status: DC | PRN
  Administered 2018-08-29 – 2018-09-06 (×6): 4 mg via INTRAVENOUS
  Filled 2018-08-29 (×9): qty 2

## 2018-08-29 MED ORDER — LIDOCAINE VISCOUS HCL 2 % MT SOLN
15.0000 mL | Freq: Once | OROMUCOSAL | Status: AC
  Administered 2018-08-29: 3 mL via OROMUCOSAL

## 2018-08-29 MED ORDER — ACETAMINOPHEN 650 MG RE SUPP
650.0000 mg | Freq: Four times a day (QID) | RECTAL | Status: DC | PRN
  Filled 2018-08-29: qty 1

## 2018-08-29 MED ORDER — IPRATROPIUM-ALBUTEROL 0.5-2.5 (3) MG/3ML IN SOLN
3.0000 mL | RESPIRATORY_TRACT | Status: DC
  Administered 2018-08-29 – 2018-08-30 (×6): 3 mL via RESPIRATORY_TRACT
  Filled 2018-08-29 (×6): qty 3

## 2018-08-29 MED ORDER — LIDOCAINE VISCOUS HCL 2 % MT SOLN
OROMUCOSAL | Status: AC
  Filled 2018-08-29: qty 15

## 2018-08-29 MED ORDER — ALBUTEROL SULFATE (2.5 MG/3ML) 0.083% IN NEBU
2.5000 mg | INHALATION_SOLUTION | Freq: Four times a day (QID) | RESPIRATORY_TRACT | Status: DC
  Administered 2018-08-29: 2.5 mg via RESPIRATORY_TRACT
  Filled 2018-08-29: qty 3

## 2018-08-29 MED ORDER — ENOXAPARIN SODIUM 40 MG/0.4ML ~~LOC~~ SOLN
40.0000 mg | SUBCUTANEOUS | Status: DC
  Administered 2018-08-29 – 2018-09-12 (×12): 40 mg via SUBCUTANEOUS
  Filled 2018-08-29 (×13): qty 0.4

## 2018-08-29 NOTE — Progress Notes (Addendum)
[facility] Progress Note     Subjective: CC:  C/o abdominal pain, worse over his hernia. Reports large amounts of flatus yesterday, none today. States his last BM was 11/25. Denies baseline constipation.   Objective: Vital signs in last 24 hours: Temp:  [98.1 F (36.7 C)-99.1 F (37.3 C)] 98.8 F (37.1 C) (11/27 0522) Pulse Rate:  [102-117] 112 (11/27 0637) Resp:  [22-34] 34 (11/27 0637) BP: (134-186)/(71-98) 186/92 (11/27 0637) SpO2:  [94 %-99 %] 95 % (11/27 8338) Weight:  [90.7 kg-98.2 kg] 98.2 kg (11/27 0126) Last BM Date: 08/26/18  Intake/Output from previous day: 11/26 0701 - 11/27 0700 In: 172.6 [I.V.:72.6; IV Piggyback:100] Out: 75 [Emesis/NG output:75] Intake/Output this shift: No intake/output data recorded.  PE: Gen:  Alert, NAD, pleasant HEENT: mandibular deformity from previous surgery, NG tube in place. Card:  Regular rate and rhythm, pedal pulses 2+ BL Pulm:  Slightly labored, wheezes bilaterally  Abd: Soft, diffusely TTP without peritonitis, pain is greatest over his ventral hernia which is soft and reducible without overlying skin changes, distended, bowel sounds present  NG - 0cc in cannister, bilious drainage in tube.  Skin: warm and dry, no rashes  Psych: A&Ox3   Lab Results:  Recent Labs    08/28/18 2142 08/29/18 0329  WBC 15.7* 12.8*  HGB 12.8* 13.1  HCT 41.5 41.2  PLT 346 315   BMET Recent Labs    08/28/18 2142 08/29/18 0329  NA 136 137  K 4.2 4.1  CL 104 106  CO2 25 23  GLUCOSE 184* 155*  BUN 17 18  CREATININE 1.35* 1.28*  CALCIUM 9.0 8.8*   PT/INR No results for input(s): LABPROT, INR in the last 72 hours. CMP     Component Value Date/Time   NA 137 08/29/2018 0329   K 4.1 08/29/2018 0329   CL 106 08/29/2018 0329   CO2 23 08/29/2018 0329   GLUCOSE 155 (H) 08/29/2018 0329   BUN 18 08/29/2018 0329   CREATININE 1.28 (H) 08/29/2018 0329   CALCIUM 8.8 (L) 08/29/2018 0329   PROT 7.5 08/29/2018 0329   ALBUMIN  3.1 (L) 08/29/2018 0329   AST 19 08/29/2018 0329   ALT 13 08/29/2018 0329   ALKPHOS 71 08/29/2018 0329   BILITOT 0.6 08/29/2018 0329   GFRNONAA 54 (L) 08/29/2018 0329   GFRAA >60 08/29/2018 0329   Lipase     Component Value Date/Time   LIPASE 25 08/28/2018 2142       Studies/Results: Ct Abdomen Pelvis W Contrast  Result Date: 08/28/2018 CLINICAL DATA:  Abdominal pain with shortness of breath EXAM: CT ABDOMEN AND PELVIS WITH CONTRAST TECHNIQUE: Multidetector CT imaging of the abdomen and pelvis was performed using the standard protocol following bolus administration of intravenous contrast. CONTRAST:  116mL OMNIPAQUE IOHEXOL 300 MG/ML  SOLN COMPARISON:  None. FINDINGS: LOWER CHEST: There is no basilar pleural or apical pericardial effusion. HEPATOBILIARY: The hepatic contours and density are normal. There is no intra- or extrahepatic biliary dilatation. There is cholelithiasis without acute inflammation. PANCREAS: The pancreatic parenchymal contours are normal and there is no ductal dilatation. There is no peripancreatic fluid collection. SPLEEN: Normal. ADRENALS/URINARY TRACT: --Adrenal glands: Normal. --Right kidney/ureter: Mild atrophy. No hydronephrosis. --Left kidney/ureter: Vascular calcifications and interpolar nonobstructive 3 mm nephrolithiasis. No hydronephrosis. --Urinary bladder: Mild bladder wall thickening, possibly due to underdistention. STOMACH/BOWEL: --Stomach/Duodenum: There is no hiatal hernia or other gastric abnormality. The duodenal course and caliber are normal. --Small bowel: There are multiple loops of dilated small  bowel in the central abdomen, predominantly anteriorly. One of these loops is directly beneath the skin surface, extending through a diastatic defect of the anterior abdominal muscles. Suspected transition point is in the anterior low midline abdomen (coronal image 24 and sagittal image 70). --Colon: No focal abnormality. --Appendix: Not visualized. No right  lower quadrant inflammation or free fluid. VASCULAR/LYMPHATIC: Atherosclerotic calcification is present within the non-aneurysmal abdominal aorta, without hemodynamically significant stenosis. The portal vein, splenic vein, superior mesenteric vein and IVC are patent. No abdominal or pelvic lymphadenopathy. REPRODUCTIVE: Normal prostate size with symmetric seminal vesicles. MUSCULOSKELETAL. Compression deformity of L2 is probably chronic. There is grade 1 L4-5 anterolisthesis secondary to facet arthrosis. OTHER: None. IMPRESSION: 1. Small bowel obstruction with suspected transition point in the low anterior midline abdomen. 2. Cholelithiasis without acute inflammation. 3. Nonobstructing left nephrolithiasis. Aortic Atherosclerosis (ICD10-I70.0). Electronically Signed   By: Ulyses Jarred M.D.   On: 08/28/2018 23:21   Dg Chest Port 1 View  Result Date: 08/28/2018 CLINICAL DATA:  77 y/o  M; abdominal pain and shortness of breath. EXAM: PORTABLE CHEST 1 VIEW COMPARISON:  None. FINDINGS: Normal cardiac silhouette. Aortic atherosclerosis with calcification. Perihilar and basilar reticular opacities. No consolidation. No pleural effusion or pneumothorax. No acute osseous abnormality is evident. IMPRESSION: Basilar reticular opacities may represent mild interstitial edema or atypical pneumonia. No consolidation. Electronically Signed   By: Kristine Garbe M.D.   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

## 2018-08-29 NOTE — Progress Notes (Signed)
Patient off unit for NG placement. Denies needs.

## 2018-08-29 NOTE — Progress Notes (Addendum)
PROGRESS NOTE        PATIENT DETAILS Name: Luis Lyons Age: 77 y.o. Sex: male Date of Birth: 1941/07/10 Admit Date: 08/28/2018 Admitting Physician [name], MD GYB:WLSLHT, [name], MD  Brief Narrative: 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  Upon further evaluation he was found to have pneumonia and a small bowel obstruction.  See below for further details  Subjective: Denies any abdominal pain-no chest pain or shortness of breath.  Passed flatus yesterday.  Claims last BM was several days back.  Assessment/Plan: SBO: Continue supportive care-n.p.o. status/NG tube decompression-General surgery following and planning on small bowel protocol later today.  Pneumonia: Could be aspiration pneumonia in the setting of vomiting-continue empiric antimicrobial therapy.  Is afebrile, and stable looking at this point.  AKI: Mild-likely hemodynamically mediated-improving with supportive care  History of CVA with chronic left-sided weakness: At baseline.  Resume aspirin once oral intake has been resumed.  History of prostate cancer-status post prostatectomy: Followed by urology in the outpatient setting  DVT Prophylaxis: Prophylactic Lovenox  Code Status: DNR  Family Communication: None at bedside  Disposition Plan: Remain inpatient-requires several more days of hospitalization before consideration of discharge.  Antimicrobial agents: Anti-infectives (From admission, onward)   Start     Dose/Rate Route Frequency Ordered Stop   08/28/18 2230  cefTRIAXone (ROCEPHIN) 2 g in sodium chloride 0.9 % 100 mL IVPB     2 g 200 mL/hr over 30 Minutes Intravenous Every 24 hours 08/28/18 2215     08/28/18 2230  azithromycin (ZITHROMAX) 500 mg in sodium chloride 0.9 %  250 mL IVPB     500 mg 250 mL/hr over 60 Minutes Intravenous Every 24 hours 08/28/18 2215        Procedures: None  CONSULTS:  general surgery  Time spent: 25- minutes-Greater than 50% of this time was spent in counseling, explanation of diagnosis, planning of further management, and coordination of care.  MEDICATIONS: Scheduled Meds: . diatrizoate meglumine-sodium  90 mL Per NG tube Once  . ipratropium-albuterol  3 mL Nebulization Q4H   Continuous Infusions: . sodium chloride 75 mL/hr at 08/29/18 0700  . azithromycin Stopped (08/29/18 0120)  . cefTRIAXone (ROCEPHIN)  IV Stopped (08/28/18 2301)   PRN Meds:.acetaminophen **OR** acetaminophen, albuterol, hydrALAZINE, morphine injection, ondansetron **OR** ondansetron (ZOFRAN) IV   PHYSICAL EXAM: Vital signs: Vitals:   08/29/18 0522 08/29/18 0637 08/29/18 0852 08/29/18 1147  BP: (!) 181/76 (!) 186/92    Pulse: (!) 110 (!) 112    Resp:  (!) 34    Temp: 98.8 F (37.1 C)     TempSrc: Oral     SpO2: 96% 95% 95% 92%  Weight:      Height:       Filed Weights   08/28/18 2130 08/29/18 0126  Weight: 90.7 kg 98.2 kg   Body mass index is 28.56 kg/m.   General appearance :Awake, alert, not in any distress.  He is to be dysarthric-at baseline Eyes:Pink conjunctiva Neck: supple, Resp:Good air entry bilaterally, no added sounds  CVS: S1 S2 regular, no murmurs.  GI: Joaquim Lai appears very sluggish-prior scars present-but nontender- nondistended  Extremities: B/L Lower Ext shows no edema, both legs are warm to  touch Musculoskeletal:No digital cyanosis Skin:No Rash, warm and dry Wounds:N/A  I have personally reviewed following labs and imaging studies  LABORATORY DATA: CBC: Recent Labs  Lab 08/28/18 2142 08/29/18 0329  WBC 15.7* 12.8*  NEUTROABS 13.7*  --   HGB 12.8* 13.1  HCT 41.5 41.2  MCV 89.6 89.0  PLT 346 272    Basic Metabolic Panel: Recent Labs  Lab 08/28/18 2142 08/29/18 0329  NA 136 137  K 4.2 4.1  CL  104 106  CO2 25 23  GLUCOSE 184* 155*  BUN 17 18  CREATININE 1.35* 1.28*  CALCIUM 9.0 8.8*    GFR: Estimated Creatinine Clearance: 60.6 mL/min (A) (by C-G formula based on SCr of 1.28 mg/dL (H)).  Liver Function Tests: Recent Labs  Lab 08/28/18 2142 08/29/18 0329  AST 16 19  ALT 13 13  ALKPHOS 76 71  BILITOT 0.8 0.6  PROT 7.8 7.5  ALBUMIN 3.2* 3.1*   Recent Labs  Lab 08/28/18 2142  LIPASE 25   No results for input(s): AMMONIA in the last 168 hours.  Coagulation Profile: No results for input(s): INR, PROTIME in the last 168 hours.  Cardiac Enzymes: No results for input(s): CKTOTAL, CKMB, CKMBINDEX, TROPONINI in the last 168 hours.  BNP (last 3 results) No results for input(s): PROBNP in the last 8760 hours.  HbA1C: No results for input(s): HGBA1C in the last 72 hours.  CBG: No results for input(s): GLUCAP in the last 168 hours.  Lipid Profile: No results for input(s): CHOL, HDL, LDLCALC, TRIG, CHOLHDL, LDLDIRECT in the last 72 hours.  Thyroid Function Tests: No results for input(s): TSH, T4TOTAL, FREET4, T3FREE, THYROIDAB in the last 72 hours.  Anemia Panel: No results for input(s): VITAMINB12, FOLATE, FERRITIN, TIBC, IRON, RETICCTPCT in the last 72 hours.  Urine analysis: No results found for: COLORURINE, APPEARANCEUR, LABSPEC, PHURINE, GLUCOSEU, HGBUR, BILIRUBINUR, KETONESUR, [place], UROBILINOGEN, NITRITE, LEUKOCYTESUR  Sepsis Labs: Lactic Acid, Venous    Component Value Date/Time   LATICACIDVEN 1.86 08/28/2018 2153    MICROBIOLOGY: Recent Results (from the past 240 hour(s))  Blood Culture (routine x 2)     Status: None (Preliminary result)   Collection Time: 08/28/18  9:50 PM  Result Value Ref Range Status   Specimen Description BLOOD LEFT FOREARM  Final   Special Requests   Final    BOTTLES DRAWN AEROBIC AND ANAEROBIC Blood Culture adequate volume   Culture   Final    NO GROWTH < 12 HOURS Performed at [facility], [place] [address], [place], [place] [address]    Report Status PENDING  Incomplete  Blood Culture (routine x 2)     Status: None (Preliminary result)   Collection Time: 08/28/18 10:00 PM  Result Value Ref Range Status   Specimen Description BLOOD RIGHT HAND  Final   Special Requests   Final    BOTTLES DRAWN AEROBIC AND ANAEROBIC Blood Culture adequate volume   Culture   Final    NO GROWTH < 12 HOURS Performed at [facility], [place] [address] [place], [place] [address]    Report Status PENDING  Incomplete  MRSA PCR Screening     Status: None   Collection Time: 08/29/18  2:00 AM  Result Value Ref Range Status   MRSA by PCR NEGATIVE NEGATIVE Final    Comment:        The GeneXpert MRSA Assay (FDA approved for NASAL specimens only), is one component of a comprehensive MRSA colonization surveillance program.  It is not intended to diagnose MRSA infection nor to guide or monitor treatment for MRSA infections. Performed at [facility], [place] [address], [place],  [address]     RADIOLOGY STUDIES/RESULTS: Dg Abd 1 View  Result Date: 08/29/2018 CLINICAL DATA:  Check nasogastric catheter placement EXAM: ABDOMEN - 1 VIEW COMPARISON:  None. FINDINGS: 30 seconds of fluoroscopy was utilized for placement. The nasogastric catheter is shown coiled within the gastric lumen. IMPRESSION: Nasogastric catheter placed in the stomach. Electronically Signed   By: Inez Catalina M.D.   On: 08/29/2018 09:08   Ct Abdomen Pelvis W Contrast  Result Date: 08/28/2018 CLINICAL DATA:  Abdominal pain with shortness of breath EXAM: CT ABDOMEN AND PELVIS WITH CONTRAST TECHNIQUE: Multidetector CT imaging of the abdomen and pelvis was performed using the standard protocol following bolus administration of intravenous contrast. CONTRAST:  164mL OMNIPAQUE IOHEXOL 300 MG/ML  SOLN COMPARISON:  None. FINDINGS: LOWER CHEST: There is no basilar pleural or apical pericardial effusion. HEPATOBILIARY: The hepatic contours and  density are normal. There is no intra- or extrahepatic biliary dilatation. There is cholelithiasis without acute inflammation. PANCREAS: The pancreatic parenchymal contours are normal and there is no ductal dilatation. There is no peripancreatic fluid collection. SPLEEN: Normal. ADRENALS/URINARY TRACT: --Adrenal glands: Normal. --Right kidney/ureter: Mild atrophy. No hydronephrosis. --Left kidney/ureter: Vascular calcifications and interpolar nonobstructive 3 mm nephrolithiasis. No hydronephrosis. --Urinary bladder: Mild bladder wall thickening, possibly due to underdistention. STOMACH/BOWEL: --Stomach/Duodenum: There is no hiatal hernia or other gastric abnormality. The duodenal course and caliber are normal. --Small bowel: There are multiple loops of dilated small bowel in the central abdomen, predominantly anteriorly. One of these loops is directly beneath the skin surface, extending through a diastatic defect of the anterior abdominal muscles. Suspected transition point is in the anterior low midline abdomen (coronal image 24 and sagittal image 70). --Colon: No focal abnormality. --Appendix: Not visualized. No right lower quadrant inflammation or free fluid. VASCULAR/LYMPHATIC: Atherosclerotic calcification is present within the non-aneurysmal abdominal aorta, without hemodynamically significant stenosis. The portal vein, splenic vein, superior mesenteric vein and IVC are patent. No abdominal or pelvic lymphadenopathy. REPRODUCTIVE: Normal prostate size with symmetric seminal vesicles. MUSCULOSKELETAL. Compression deformity of L2 is probably chronic. There is grade 1 L4-5 anterolisthesis secondary to facet arthrosis. OTHER: None. IMPRESSION: 1. Small bowel obstruction with suspected transition point in the low anterior midline abdomen. 2. Cholelithiasis without acute inflammation. 3. Nonobstructing left nephrolithiasis. Aortic Atherosclerosis (ICD10-I70.0). Electronically Signed   By: Ulyses Jarred M.D.   On:  08/28/2018 23:21   Dg Chest Port 1 View  Result Date: 08/28/2018 CLINICAL DATA:  77 y/o  M; abdominal pain and shortness of breath. EXAM: PORTABLE CHEST 1 VIEW COMPARISON:  None. FINDINGS: Normal cardiac silhouette. Aortic atherosclerosis with calcification. Perihilar and basilar reticular opacities. No consolidation. No pleural effusion or pneumothorax. No acute osseous abnormality is evident. IMPRESSION: Basilar reticular opacities may represent mild interstitial edema or atypical pneumonia. No consolidation. Electronically Signed   By: Kristine Garbe M.D.   On: 08/28/2018 21:55   Dg Abd Portable 1v-small Bowel Obstruction Protocol-initial, 8 Hr Delay  Result Date: 08/29/2018 CLINICAL DATA:  Follow up small bowel obstruction EXAM: PORTABLE ABDOMEN - 1 VIEW COMPARISON:  08/28/2018 FINDINGS: Nasogastric catheter is now noted within the stomach. Fecal material is noted throughout the colon. Contrast is seen within the bladder from recent CT examination. Multiple dilated small bowel loops are again identified in the mid abdomen stable from the recent exam. No acute bony  abnormality is noted. Prior L2 compression deformity is noted. IMPRESSION: Stable small bowel dilatation following nasogastric catheter placement Electronically Signed   By: Inez Catalina M.D.   On: 08/29/2018 09:09     LOS: 1 day   Oren Binet, MD  [facility]  If 7PM-7AM, please contact night-coverage  Please page via [email or web address] [email or web address]-click on MD name and type text message  08/29/2018, 1:04 PM

## 2018-08-29 NOTE — H&P (Signed)
History and Physical    Luis Lyons UXN:[id number] DOB: [birth year] DOA: 08/28/2018  PCP: Elson Clan, MD  Patient coming from: Home.  Chief Complaint: Abdominal pain shortness of breath.  HPI: Luis Lyons is a 77 y.o. male with history of CVA with left-sided weakness, prostate cancer, mouth cancer status post surgery and radiation many years ago during which patient also required tracheostomy PEG tube feeds and previous history of ruptured appendicitis requiring exploratory laparotomy with ventral hernia presents to the ER after patient has been having diffuse abdominal pain with shortness of breath some subjective feeling of fever chills and productive cough for last 2 to 3 days.  Last bowel movement was more than 2 days ago.  ED Course: In the ER patient is found to be febrile hypoxic.  Chest x-ray showing infiltrates and CT abdomen and pelvis done shows small bowel obstruction with transition point and gallbladder stones.  Patient was started on empiric antibiotics for pneumonia NG tube was attempted to be placed for small bowel obstruction but due to patient's previous surgery involving the mouth it was difficult to pass.  General surgery has been consulted and patient admitted for small bowel obstruction and pneumonia.  On my exam patient was wheezing for which patient is receiving nebulizer.   Review of Systems: As per HPI, rest all negative.   Past Medical History:  Diagnosis Date  . Abdominal hernia   . Cancer ([place])   . Constipation   . Cough   . Depression   . Dry mouth   . Hard of hearing   . Hypertension   . Muscle weakness   . Stroke ([place])   . Urinary incontinence   . Vitamin D deficiency   . Vomiting   . Wheezing     Past Surgical History:  Procedure Laterality Date  . ABDOMINAL SURGERY       reports that he has quit smoking. He has never used smokeless tobacco. He reports that he drank alcohol. He reports that he does not use drugs.  No Known  Allergies  Family History  Family history unknown: Yes    Prior to Admission medications   Medication Sig Start Date End Date Taking? Authorizing Provider  acetaminophen (TYLENOL) 500 MG tablet Take 500 mg by mouth every 4 (four) hours as needed for mild pain.   Yes [provider]  albuterol (PROVENTIL) (2.5 MG/3ML) 0.083% nebulizer solution Take 2.5 mg by nebulization 2 (two) times daily as needed for wheezing or shortness of breath.   Yes [provider]  aspirin 81 MG chewable tablet Chew 81 mg by mouth daily.   Yes [provider]  chlorhexidine (PERIDEX) 0.12 % solution 15 mLs by Mouth Rinse route daily.   Yes [provider]  cholecalciferol (VITAMIN D3) 25 MCG (1000 UT) tablet Take 1,000 Units by mouth daily.   Yes [provider]  dextromethorphan (DELSYM) 30 MG/5ML liquid Take 60 mg by mouth 2 (two) times daily as needed for cough.   Yes [provider]  fluticasone (FLONASE) 50 MCG/ACT nasal spray Place 1 spray into both nostrils daily.   Yes [provider]  HYDROcodone-acetaminophen (NORCO/VICODIN) 5-325 MG tablet Take 1 tablet by mouth every 4 (four) hours as needed for moderate pain.   Yes [provider]  ibuprofen (ADVIL,MOTRIN) 200 MG tablet Take 400 mg by mouth every 6 (six) hours as needed for moderate pain.   Yes [provider]  ipratropium-albuterol (DUONEB) 0.5-2.5 (3) MG/3ML SOLN Take 3  mLs by nebulization 4 (four) times daily.    Yes [provider]  Lactobacillus (ACIDOPHILUS) CAPS capsule Take 2 capsules by mouth 2 (two) times daily.   Yes [provider]  loperamide (IMODIUM A-D) 2 MG tablet Take 2 mg by mouth 4 (four) times daily as needed for diarrhea or loose stools.   Yes [provider]  magnesium hydroxide (MILK OF MAGNESIA) 400 MG/5ML suspension Take 30 mLs by mouth daily as needed for mild constipation.   Yes [provider]  oxybutynin  (DITROPAN-XL) 5 MG 24 hr tablet Take 5 mg by mouth at bedtime.   Yes [provider]  phenazopyridine (PYRIDIUM) 200 MG tablet Take 200 mg by mouth 3 (three) times daily as needed for pain.   Yes [provider]  polyethylene glycol (MIRALAX / GLYCOLAX) packet Take 17 g by mouth daily.   Yes [provider]  polyvinyl alcohol (LIQUIFILM TEARS) 1.4 % ophthalmic solution Place 1 drop into both eyes 2 (two) times daily.   Yes [provider]  senna (SENOKOT) 8.6 MG tablet Take 1 tablet by mouth 2 (two) times daily as needed for constipation.   Yes [provider]  sodium fluoride (DENTAGEL) 1.1 % GEL dental gel Place 1 application onto teeth 2 (two) times daily.   Yes [provider]  vitamin B-12 (CYANOCOBALAMIN) 1000 MCG tablet Take 1,000 mcg by mouth daily.   Yes [provider]  zinc oxide 20 % ointment Apply 1 application topically 2 (two) times daily as needed for irritation.   Yes [provider]    Physical Exam: Vitals:   08/28/18 2115 08/28/18 2130 08/28/18 2245 08/28/18 2313  BP: 134/81  (!) 179/96 (!) 164/71  Pulse: (!) 116  (!) 108 (!) 107  Resp: (!) 28  (!) 27 (!) 31  Temp: 99.1 F (37.3 C)     TempSrc: Oral     SpO2: 94%  95% 96%  Weight:  90.7 kg    Height:  6\' 1"  (1.854 m)        Constitutional: Moderately built and nourished. Vitals:   08/28/18 2115 08/28/18 2130 08/28/18 2245 08/28/18 2313  BP: 134/81  (!) 179/96 (!) 164/71  Pulse: (!) 116  (!) 108 (!) 107  Resp: (!) 28  (!) 27 (!) 31  Temp: 99.1 F (37.3 C)     TempSrc: Oral     SpO2: 94%  95% 96%  Weight:  90.7 kg    Height:  6\' 1"  (1.854 m)     Eyes: Anicteric no pallor. ENMT: No discharge from the ears eyes nose or mouth. Neck: No mass or.  No neck rigidity.  No JVD appreciated. Respiratory: Bilateral expiratory wheeze and no crepitations. Cardiovascular: S1-S2 heard no murmurs appreciated. Abdomen: Distended ventral hernia seen.   No rebound tenderness or guarding.  Bowel sounds not appreciated. Musculoskeletal: No edema. Skin: No rash. Neurologic: Alert awake oriented to time place and person.  Moves all extremities. Psychiatric: Appears normal.  Normal affect.   Labs on Admission: I have personally reviewed following labs and imaging studies  CBC: Recent Labs  Lab 08/28/18 2142  WBC 15.7*  NEUTROABS 13.7*  HGB 12.8*  HCT 41.5  MCV 89.6  PLT 161   Basic Metabolic Panel: Recent Labs  Lab 08/28/18 2142  NA 136  K 4.2  CL 104  CO2 25  GLUCOSE 184*  BUN 17  CREATININE 1.35*  CALCIUM 9.0   GFR: Estimated Creatinine Clearance: 52.6  mL/min (A) (by C-G formula based on SCr of 1.35 mg/dL (H)). Liver Function Tests: Recent Labs  Lab 08/28/18 2142  AST 16  ALT 13  ALKPHOS 76  BILITOT 0.8  PROT 7.8  ALBUMIN 3.2*   Recent Labs  Lab 08/28/18 2142  LIPASE 25   No results for input(s): AMMONIA in the last 168 hours. Coagulation Profile: No results for input(s): INR, PROTIME in the last 168 hours. Cardiac Enzymes: No results for input(s): CKTOTAL, CKMB, CKMBINDEX, TROPONINI in the last 168 hours. BNP (last 3 results) No results for input(s): PROBNP in the last 8760 hours. HbA1C: No results for input(s): HGBA1C in the last 72 hours. CBG: No results for input(s): GLUCAP in the last 168 hours. Lipid Profile: No results for input(s): CHOL, HDL, LDLCALC, TRIG, CHOLHDL, LDLDIRECT in the last 72 hours. Thyroid Function Tests: No results for input(s): TSH, T4TOTAL, FREET4, T3FREE, THYROIDAB in the last 72 hours. Anemia Panel: No results for input(s): VITAMINB12, FOLATE, FERRITIN, TIBC, IRON, RETICCTPCT in the last 72 hours. Urine analysis: No results found for: COLORURINE, APPEARANCEUR, LABSPEC, PHURINE, GLUCOSEU, HGBUR, BILIRUBINUR, KETONESUR, PROTEINUR, UROBILINOGEN, NITRITE, LEUKOCYTESUR Sepsis Labs: @LABRCNTIP (procalcitonin:4,lacticidven:4) )No results found for this or any previous visit  (from the past 240 hour(s)).   Radiological Exams on Admission: Ct Abdomen Pelvis W Contrast  Result Date: 08/28/2018 CLINICAL DATA:  Abdominal pain with shortness of breath EXAM: CT ABDOMEN AND PELVIS WITH CONTRAST TECHNIQUE: Multidetector CT imaging of the abdomen and pelvis was performed using the standard protocol following bolus administration of intravenous contrast. CONTRAST:  164mL OMNIPAQUE IOHEXOL 300 MG/ML  SOLN COMPARISON:  None. FINDINGS: LOWER CHEST: There is no basilar pleural or apical pericardial effusion. HEPATOBILIARY: The hepatic contours and density are normal. There is no intra- or extrahepatic biliary dilatation. There is cholelithiasis without acute inflammation. PANCREAS: The pancreatic parenchymal contours are normal and there is no ductal dilatation. There is no peripancreatic fluid collection. SPLEEN: Normal. ADRENALS/URINARY TRACT: --Adrenal glands: Normal. --Right kidney/ureter: Mild atrophy. No hydronephrosis. --Left kidney/ureter: Vascular calcifications and interpolar nonobstructive 3 mm nephrolithiasis. No hydronephrosis. --Urinary bladder: Mild bladder wall thickening, possibly due to underdistention. STOMACH/BOWEL: --Stomach/Duodenum: There is no hiatal hernia or other gastric abnormality. The duodenal course and caliber are normal. --Small bowel: There are multiple loops of dilated small bowel in the central abdomen, predominantly anteriorly. One of these loops is directly beneath the skin surface, extending through a diastatic defect of the anterior abdominal muscles. Suspected transition point is in the anterior low midline abdomen (coronal image 24 and sagittal image 70). --Colon: No focal abnormality. --Appendix: Not visualized. No right lower quadrant inflammation or free fluid. VASCULAR/LYMPHATIC: Atherosclerotic calcification is present within the non-aneurysmal abdominal aorta, without hemodynamically significant stenosis. The portal vein, splenic vein, superior  mesenteric vein and IVC are patent. No abdominal or pelvic lymphadenopathy. REPRODUCTIVE: Normal prostate size with symmetric seminal vesicles. MUSCULOSKELETAL. Compression deformity of L2 is probably chronic. There is grade 1 L4-5 anterolisthesis secondary to facet arthrosis. OTHER: None. IMPRESSION: 1. Small bowel obstruction with suspected transition point in the low anterior midline abdomen. 2. Cholelithiasis without acute inflammation. 3. Nonobstructing left nephrolithiasis. Aortic Atherosclerosis (ICD10-I70.0). Electronically Signed   By: Ulyses Jarred M.D.   On: 08/28/2018 23:21   Dg Chest Port 1 View  Result Date: 08/28/2018 CLINICAL DATA:  77 y/o  M; abdominal pain and shortness of breath. EXAM: PORTABLE CHEST 1 VIEW COMPARISON:  None. FINDINGS: Normal cardiac silhouette. Aortic atherosclerosis with calcification. Perihilar and basilar reticular opacities. No consolidation. No  pleural effusion or pneumothorax. No acute osseous abnormality is evident. IMPRESSION: Basilar reticular opacities may represent mild interstitial edema or atypical pneumonia. No consolidation. Electronically Signed   By: Kristine Garbe M.D.   On: 08/28/2018 21:55    EKG: Independently reviewed.  Normal sinus rhythm.  Assessment/Plan Principal Problem:   SBO (small bowel obstruction) (HCC) Active Problems:   CAP (community acquired pneumonia)   Essential hypertension   Acute bronchitis    1. Small bowel obstruction -discussed with Dr. Barry Dienes on-call general surgeon.  At this time it was difficult to place NG tube and surgery has consulted fluoroscopic guidance NG tube placement.  Repeat KUB in the morning.  IV fluids n.p.o. pain relief medications.  Follow metabolic panel intake output. 2. Possible pneumonia with bronchitis likely could be aspiration on empiric antibiotics for now.  Follow cultures.  On nebulizer treatment for wheezing. 3. Elevated blood pressure possibly from hypertension have placed  patient on PRN IV hydralazine follow blood pressure trends. 4. Previous history of alcohol abuse but has not had any alcohol many years. 5. History of prostate cancer and mouth cancer. 6. Previous history of stroke with left-sided weakness. 7. Anemia -follow CBC. 8. Renal failure probably acute from vomiting.  Follow metabolic panel.   DVT prophylaxis: SCDs. Code Status: DO NOT RESUSCITATE. Family Communication: Patient's daughter. Disposition Plan: Home. Consults called: General surgery. Admission status: Inpatient.   [name] MD [facility] Pager [phone].  If 7PM-7AM, please contact night-coverage [email or web address] Password [facility]  08/29/2018, 12:26 AM

## 2018-08-29 NOTE — ED Notes (Signed)
RN attempted to place NG tube, tube coiled in pt's mouth. Pt requested to take out. RN will attempt again after neb tx

## 2018-08-29 NOTE — Consult Note (Signed)
Reason for Consult:SBO Referring Physician: [name] is an 77 y.o. male.  HPI:  Pt is a 77 yo M with numerous medical problems who presented with 18 hours or so of cough, low grade fever, abdominal pain, nausea and vomiting.  The patient has a history of mouth cancer requiring surgery and radiation as well as trach which has since been removed.  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 He has frequent UTIs as well.  Additionally he has history of melanoma and prostate cancer.    He has known ventral hernia and has been seen at [facility].  He had a large ex lap from severe ruptured appendicitis and hernia has been slowly enlarging with chronic cough.    He does not recall passing gas for over 14 hours and hasn't had a BM for several days.  He does trend toward constipation and has to be "cleaned out" occasionally.    Past Medical History:  Diagnosis Date  . Abdominal hernia   . Cancer ([place])   . Constipation   . Cough   . Depression   . Dry mouth   . Hard of hearing   . Hypertension   . Muscle weakness   . Stroke ([place])   . Urinary incontinence   . Vitamin D deficiency   . Vomiting   . Wheezing     Past Surgical History:  Procedure Laterality Date  . ABDOMINAL SURGERY      Family History  Family history unknown: Yes    Social History:  reports that he has quit smoking. He has never used smokeless tobacco. He reports that he drank alcohol. He reports that he does not use drugs.  Allergies: No Known Allergies  Medications:  Prior to Admission:  Medications Prior to Admission  Medication Sig Dispense Refill Last Dose  . acetaminophen (TYLENOL) 500 MG tablet Take 500 mg by mouth every 4 (four) hours as needed for mild pain.   05/10/18  . albuterol (PROVENTIL) (2.5 MG/3ML) 0.083% nebulizer solution Take 2.5 mg by nebulization 2 (two) times daily as needed for wheezing or shortness of breath.   06/28/18  . aspirin 81 MG chewable  tablet Chew 81 mg by mouth daily.   08/28/2018 at 0945  . chlorhexidine (PERIDEX) 0.12 % solution 15 mLs by Mouth Rinse route daily.   08/28/2018 at Unknown time  . cholecalciferol (VITAMIN D3) 25 MCG (1000 UT) tablet Take 1,000 Units by mouth daily.   08/28/2018 at Unknown time  . dextromethorphan (DELSYM) 30 MG/5ML liquid Take 60 mg by mouth 2 (two) times daily as needed for cough.   Past Month at Unknown time  . fluticasone (FLONASE) 50 MCG/ACT nasal spray Place 1 spray into both nostrils daily.   08/28/2018 at Unknown time  . HYDROcodone-acetaminophen (NORCO/VICODIN) 5-325 MG tablet Take 1 tablet by mouth every 4 (four) hours as needed for moderate pain.   08/28/2018 at Unknown time  . ibuprofen (ADVIL,MOTRIN) 200 MG tablet Take 400 mg by mouth every 6 (six) hours as needed for moderate pain.   Past Month at Unknown time  . ipratropium-albuterol (DUONEB) 0.5-2.5 (3) MG/3ML SOLN Take 3 mLs by nebulization 4 (four) times daily.    08/28/2018 at Unknown time  . Lactobacillus (ACIDOPHILUS) CAPS capsule Take 2 capsules by mouth 2 (two) times daily.   08/28/2018 at Unknown time  . loperamide (IMODIUM A-D) 2 MG tablet Take 2 mg by mouth 4 (four)  times daily as needed for diarrhea or loose stools.   05/31/18  . magnesium hydroxide (MILK OF MAGNESIA) 400 MG/5ML suspension Take 30 mLs by mouth daily as needed for mild constipation.   01/31/18  . oxybutynin (DITROPAN-XL) 5 MG 24 hr tablet Take 5 mg by mouth at bedtime.   08/28/2018 at Unknown time  . phenazopyridine (PYRIDIUM) 200 MG tablet Take 200 mg by mouth 3 (three) times daily as needed for pain.   Past Month at Unknown time  . polyethylene glycol (MIRALAX / GLYCOLAX) packet Take 17 g by mouth daily.   08/28/2018 at Unknown time  . polyvinyl alcohol (LIQUIFILM TEARS) 1.4 % ophthalmic solution Place 1 drop into both eyes 2 (two) times daily.   08/28/2018 at Unknown time  . senna (SENOKOT) 8.6 MG tablet Take 1 tablet by mouth 2 (two) times daily as needed  for constipation.   06/04/18  . sodium fluoride (DENTAGEL) 1.1 % GEL dental gel Place 1 application onto teeth 2 (two) times daily.   08/28/2018 at Unknown time  . vitamin B-12 (CYANOCOBALAMIN) 1000 MCG tablet Take 1,000 mcg by mouth daily.   08/28/2018 at Unknown time  . zinc oxide 20 % ointment Apply 1 application topically 2 (two) times daily as needed for irritation.   unk    Results for orders placed or performed during the hospital encounter of 08/28/18 (from the past 48 hour(s))  Comprehensive metabolic panel     Status: Abnormal   Collection Time: 08/28/18  9:42 PM  Result Value Ref Range   Sodium 136 135 - 145 mmol/L   Potassium 4.2 3.5 - 5.1 mmol/L   Chloride 104 98 - 111 mmol/L   CO2 25 22 - 32 mmol/L   Glucose, Bld 184 (H) 70 - 99 mg/dL   BUN 17 8 - 23 mg/dL   Creatinine, Ser 1.35 (H) 0.61 - 1.24 mg/dL   Calcium 9.0 8.9 - 10.3 mg/dL   Total Protein 7.8 6.5 - 8.1 g/dL   Albumin 3.2 (L) 3.5 - 5.0 g/dL   AST 16 15 - 41 U/L   ALT 13 0 - 44 U/L   Alkaline Phosphatase 76 38 - 126 U/L   Total Bilirubin 0.8 0.3 - 1.2 mg/dL   GFR calc non Af Amer 51 (L) >60 mL/min   GFR calc Af Amer 59 (L) >60 mL/min   Anion gap 7 5 - 15    Comment: Performed at [facility], [address] [place], [place] [address]  CBC WITH DIFFERENTIAL     Status: Abnormal   Collection Time: 08/28/18  9:42 PM  Result Value Ref Range   WBC 15.7 (H) 4.0 - 10.5 K/uL   RBC 4.63 4.22 - 5.81 MIL/uL   Hemoglobin 12.8 (L) 13.0 - 17.0 g/dL   HCT 41.5 39.0 - 52.0 %   MCV 89.6 80.0 - 100.0 fL   MCH 27.6 26.0 - 34.0 pg   MCHC 30.8 30.0 - 36.0 g/dL   RDW 14.1 11.5 - 15.5 %   Platelets 346 150 - 400 K/uL   nRBC 0.0 0.0 - 0.2 %   Neutrophils Relative % 88 %   Neutro Abs 13.7 (H) 1.7 - 7.7 K/uL   Lymphocytes Relative 7 %   Lymphs Abs 1.2 0.7 - 4.0 K/uL   Monocytes Relative 5 %   Monocytes Absolute 0.7 0.1 - 1.0 K/uL   Eosinophils Relative 0 %   Eosinophils Absolute 0.0 0.0 - 0.5 K/uL  Basophils Relative  0 %   Basophils Absolute 0.0 0.0 - 0.1 K/uL   Immature Granulocytes 0 %   Abs Immature Granulocytes 0.07 0.00 - 0.07 K/uL    Comment: Performed at [facility], [place] [address] [place], [place] [address]  Lipase, blood     Status: None   Collection Time: 08/28/18  9:42 PM  Result Value Ref Range   Lipase 25 11 - 51 U/L    Comment: Performed at [facility] [address], [place], [place] [address]  I-Stat CG4 Lactic Acid, ED     Status: None   Collection Time: 08/28/18  9:53 PM  Result Value Ref Range   Lactic Acid, Venous 1.86 0.5 - 1.9 mmol/L    Ct Abdomen Pelvis W Contrast  Result Date: 08/28/2018 CLINICAL DATA:  Abdominal pain with shortness of breath EXAM: CT ABDOMEN AND PELVIS WITH CONTRAST TECHNIQUE: Multidetector CT imaging of the abdomen and pelvis was performed using the standard protocol following bolus administration of intravenous contrast. CONTRAST:  165mL OMNIPAQUE IOHEXOL 300 MG/ML  SOLN COMPARISON:  None. FINDINGS: LOWER CHEST: There is no basilar pleural or apical pericardial effusion. HEPATOBILIARY: The hepatic contours and density are normal. There is no intra- or extrahepatic biliary dilatation. There is cholelithiasis without acute inflammation. PANCREAS: The pancreatic parenchymal contours are normal and there is no ductal dilatation. There is no peripancreatic fluid collection. SPLEEN: Normal. ADRENALS/URINARY TRACT: --Adrenal glands: Normal. --Right kidney/ureter: Mild atrophy. No hydronephrosis. --Left kidney/ureter: Vascular calcifications and interpolar nonobstructive 3 mm nephrolithiasis. No hydronephrosis. --Urinary bladder: Mild bladder wall thickening, possibly due to underdistention. STOMACH/BOWEL: --Stomach/Duodenum: There is no hiatal hernia or other gastric abnormality. The duodenal course and caliber are normal. --Small bowel: There are multiple loops of dilated small bowel in the central abdomen, predominantly anteriorly. One of these loops is  directly beneath the skin surface, extending through a diastatic defect of the anterior abdominal muscles. Suspected transition point is in the anterior low midline abdomen (coronal image 24 and sagittal image 70). --Colon: No focal abnormality. --Appendix: Not visualized. No right lower quadrant inflammation or free fluid. VASCULAR/LYMPHATIC: Atherosclerotic calcification is present within the non-aneurysmal abdominal aorta, without hemodynamically significant stenosis. The portal vein, splenic vein, superior mesenteric vein and IVC are patent. No abdominal or pelvic lymphadenopathy. REPRODUCTIVE: Normal prostate size with symmetric seminal vesicles. MUSCULOSKELETAL. Compression deformity of L2 is probably chronic. There is grade 1 L4-5 anterolisthesis secondary to facet arthrosis. OTHER: None. IMPRESSION: 1. Small bowel obstruction with suspected transition point in the low anterior midline abdomen. 2. Cholelithiasis without acute inflammation. 3. Nonobstructing left nephrolithiasis. Aortic Atherosclerosis (ICD10-I70.0). Electronically Signed   By: Ulyses Jarred M.D.   On: 08/28/2018 23:21   Dg Chest Port 1 View  Result Date: 08/28/2018 CLINICAL DATA:  77 y/o  M; abdominal pain and shortness of breath. EXAM: PORTABLE CHEST 1 VIEW COMPARISON:  None. FINDINGS: Normal cardiac silhouette. Aortic atherosclerosis with calcification. Perihilar and basilar reticular opacities. No consolidation. No pleural effusion or pneumothorax. No acute osseous abnormality is evident. IMPRESSION: Basilar reticular opacities may represent mild interstitial edema or atypical pneumonia. No consolidation. Electronically Signed   By: Kristine Garbe M.D.   On: 08/28/2018 21:55    Review of Systems  Constitutional: Positive for fever. Negative for chills and weight loss.  HENT: Positive for congestion.   Eyes: Negative.   Respiratory: Positive for cough and sputum production.   Gastrointestinal: Positive for abdominal  pain, constipation, nausea and vomiting.  Genitourinary:  Incontinence  Neurological:       Hard of hearing. Prior stroke.  Endo/Heme/Allergies: Negative.    Blood pressure (!) 168/72, pulse (!) 102, temperature 99.1 F (37.3 C), temperature source Oral, resp. rate (!) 28, height 6\' 1"  (1.854 m), weight 90.7 kg, SpO2 99 %. Physical Exam  Constitutional: He is oriented to person, place, and time. He appears well-developed and well-nourished. He appears distressed.  Eyes: Pupils are equal, round, and reactive to light. Conjunctivae are normal. Right eye exhibits no discharge. Left eye exhibits no discharge. No scleral icterus.  Neck: Neck supple.  Surgical changes from partial mandibulectomy, radical neck dissection, radiation, and prior trach  Cardiovascular: Normal rate, regular rhythm, normal heart sounds and intact distal pulses.  Respiratory: Effort normal.  Coarse breath sounds throughout.  GI: Soft. He exhibits distension. There is tenderness (at large ventral hernia only.  remaining abdomen non tender.  hernia reducible).  Musculoskeletal: Normal range of motion.  Neurological: He is alert and oriented to person, place, and time.  Hard of hearing.  Skin: Skin is warm. No rash noted. He is diaphoretic. No erythema. No pallor.  Psychiatric: He has a normal mood and affect. His behavior is normal. Judgment and thought content normal.    Assessment/Plan: Complicated patient with low grade fevers, leukocytosis, cough with sputum production in addition to nausea, vomiting, abdominal distention.  It is possible that he started having URI and cough exacerbated hernia symptoms, however, he could have true p SBO and have aspirated with n/v.    Either way, would treat like bowel obstruction with NPO, IV Fluids, NGT and small bowel protocol.  I think we will have to request assistance from radiology in placing NGT due to his prior surgery.  Staff has had difficulty with continued  coiling in mouth.  Temp/WBCs could also be due to UTI, but we don't have urine specimen yet.    Surgery would be difficult in this patient given prior history of prostatectomy and ex lap with ruptured appendix and gastrocutaneous fistula.    Stark Klein 08/29/2018, 1:17 AM

## 2018-08-30 ENCOUNTER — Inpatient Hospital Stay (HOSPITAL_COMMUNITY): Payer: Medicare Other

## 2018-08-30 LAB — BASIC METABOLIC PANEL
ANION GAP: 7 (ref 5–15)
BUN: 18 mg/dL (ref 8–23)
CALCIUM: 9.1 mg/dL (ref 8.9–10.3)
CO2: 26 mmol/L (ref 22–32)
Chloride: 108 mmol/L (ref 98–111)
Creatinine, Ser: 1.42 mg/dL — ABNORMAL HIGH (ref 0.61–1.24)
GFR calc Af Amer: 55 mL/min — ABNORMAL LOW (ref >60)
GFR, EST NON AFRICAN AMERICAN: 48 mL/min — AB (ref >60)
GLUCOSE: 130 mg/dL — AB (ref 70–99)
Potassium: 3.9 mmol/L (ref 3.5–5.1)
Sodium: 141 mmol/L (ref 135–145)

## 2018-08-30 LAB — URINALYSIS, ROUTINE W REFLEX MICROSCOPIC
Bilirubin Urine: NEGATIVE
Glucose, UA: NEGATIVE mg/dL
Ketones, ur: 5 mg/dL — AB
Nitrite: POSITIVE — AB
PH: 5 (ref 5.0–8.0)
Protein, ur: 30 mg/dL — AB
SPECIFIC GRAVITY, URINE: 1.027 (ref 1.005–1.030)
WBC, UA: >50 WBC/hpf — ABNORMAL HIGH (ref 0–5)

## 2018-08-30 LAB — CBC
HCT: 37.9 % — ABNORMAL LOW (ref 39.0–52.0)
Hemoglobin: 12 g/dL — ABNORMAL LOW (ref 13.0–17.0)
MCH: 28.4 pg (ref 26.0–34.0)
MCHC: 31.7 g/dL (ref 30.0–36.0)
MCV: 89.6 fL (ref 80.0–100.0)
NRBC: 0 % (ref 0.0–0.2)
PLATELETS: 302 10*3/uL (ref 150–400)
RBC: 4.23 MIL/uL (ref 4.22–5.81)
RDW: 14.3 % (ref 11.5–15.5)
WBC: 11 10*3/uL — ABNORMAL HIGH (ref 4.0–10.5)

## 2018-08-30 MED ORDER — POTASSIUM CHLORIDE IN NACL 20-0.9 MEQ/L-% IV SOLN
INTRAVENOUS | Status: AC
  Administered 2018-08-30 – 2018-09-04 (×12): via INTRAVENOUS
  Filled 2018-08-30 (×15): qty 1000

## 2018-08-30 MED ORDER — IPRATROPIUM-ALBUTEROL 0.5-2.5 (3) MG/3ML IN SOLN
3.0000 mL | Freq: Two times a day (BID) | RESPIRATORY_TRACT | Status: DC
  Administered 2018-08-30 – 2018-09-13 (×26): 3 mL via RESPIRATORY_TRACT
  Filled 2018-08-30 (×28): qty 3

## 2018-08-30 MED ORDER — IPRATROPIUM-ALBUTEROL 0.5-2.5 (3) MG/3ML IN SOLN
3.0000 mL | Freq: Four times a day (QID) | RESPIRATORY_TRACT | Status: DC
  Administered 2018-08-30: 3 mL via RESPIRATORY_TRACT
  Filled 2018-08-30: qty 3

## 2018-08-30 NOTE — Progress Notes (Signed)
PROGRESS NOTE        PATIENT DETAILS Name: Luis Lyons Age: 77 y.o. Sex: male Date of Birth: 11-21-1940 Admit Date: 08/28/2018 Admitting Physician [name], MD OVF:IEPPIR, [name], MD  Brief Narrative: Patient is a 78 y.o. 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  Upon further evaluation he was found to have pneumonia and a small bowel obstruction.  See below for further details  Subjective: No vomiting this morning-apparently vomited a few times in the afternoon.  Denies any abdominal pain.  Assessment/Plan: SBO: No BM/flatus yet-S/P protocol started yesterday-8-hour x-ray showed contrast in the small bowel without any contrast in the large bowel.  Continue supportive care-remains n.p.o.-continue NG tube decompression.  General surgery following.  Will await further recommendations.  Pneumonia: Suspect that this is probably aspiration pneumonia-afebrile-does not look toxic-continue empiric antimicrobial therapy.    AKI: Appears mild-slight bump in creatinine today-increase IV fluids to 125 cc an hour-continue supportive care-avoid nephrotoxic agents.  Follow.    History of CVA with chronic left-sided weakness: Remains at baseline-plans are to resume aspirin once oral intake has resumed.   History of prostate cancer-status post prostatectomy: Followed by urology in the outpatient setting  DVT Prophylaxis: Prophylactic Lovenox  Code Status: DNR  Family Communication: None at bedside  Disposition Plan: Remain inpatient-requires several more days of hospitalization before consideration of discharge.  Antimicrobial agents: Anti-infectives (From admission, onward)   Start     Dose/Rate Route Frequency Ordered Stop   08/28/18 2230  cefTRIAXone (ROCEPHIN) 2 g in sodium  chloride 0.9 % 100 mL IVPB     2 g 200 mL/hr over 30 Minutes Intravenous Every 24 hours 08/28/18 2215     08/28/18 2230  azithromycin (ZITHROMAX) 500 mg in sodium chloride 0.9 % 250 mL IVPB     500 mg 250 mL/hr over 60 Minutes Intravenous Every 24 hours 08/28/18 2215        Procedures: None  CONSULTS:  general surgery  Time spent: 25- minutes-Greater than 50% of this time was spent in counseling, explanation of diagnosis, planning of further management, and coordination of care.  MEDICATIONS: Scheduled Meds: . enoxaparin (LOVENOX) injection  40 mg Subcutaneous Q24H  . ipratropium-albuterol  3 mL Nebulization Q6H  . mouth rinse  15 mL Mouth Rinse BID   Continuous Infusions: . 0.9 % NaCl with KCl 20 mEq / L    . azithromycin 500 mg (08/29/18 2245)  . cefTRIAXone (ROCEPHIN)  IV Stopped (08/29/18 2226)   PRN Meds:.acetaminophen **OR** acetaminophen, albuterol, hydrALAZINE, morphine injection, ondansetron **OR** ondansetron (ZOFRAN) IV   PHYSICAL EXAM: Vital signs: Vitals:   08/29/18 2321 08/29/18 2343 08/30/18 0344 08/30/18 0453  BP: (!) 165/77   (!) 155/78  Pulse: (!) 101   99  Resp:    (!) 22  Temp:    98.2 F (36.8 C)  TempSrc:    Oral  SpO2:  95% 95% 95%  Weight:      Height:       Filed Weights   08/28/18 2130 08/29/18 0126  Weight: 90.7 kg 98.2 kg   Body mass index is 28.56 kg/m.   General appearance:Awake, alert, not in any distress.  Eyes:no scleral icterus. Neck: supple, no JVD. Resp:Good air  entry bilaterally, no added sounds anteriorly. CVS: S1 S2 regular, no murmurs.  GI: All sounds are very sluggish, Non tender and not distended with no gaurding, rigidity or rebound. Extremities: B/L Lower Ext shows no edema, both legs are warm to touch Neurology:  Non focal Psychiatric: Normal judgment and insight. Normal mood. Musculoskeletal:No digital cyanosis Skin:No Rash, warm and dry Wounds:N/A  I have personally reviewed following labs and imaging  studies  LABORATORY DATA: CBC: Recent Labs  Lab 08/28/18 2142 08/29/18 0329 08/30/18 0530  WBC 15.7* 12.8* 11.0*  NEUTROABS 13.7*  --   --   HGB 12.8* 13.1 12.0*  HCT 41.5 41.2 37.9*  MCV 89.6 89.0 89.6  PLT 346 315 607    Basic Metabolic Panel: Recent Labs  Lab 08/28/18 2142 08/29/18 0329 08/30/18 0530  NA 136 137 141  K 4.2 4.1 3.9  CL 104 106 108  CO2 25 23 26   GLUCOSE 184* 155* 130*  BUN 17 18 18   CREATININE 1.35* 1.28* 1.42*  CALCIUM 9.0 8.8* 9.1    GFR: Estimated Creatinine Clearance: 54.6 mL/min (A) (by C-G formula based on SCr of 1.42 mg/dL (H)).  Liver Function Tests: Recent Labs  Lab 08/28/18 2142 08/29/18 0329  AST 16 19  ALT 13 13  ALKPHOS 76 71  BILITOT 0.8 0.6  PROT 7.8 7.5  ALBUMIN 3.2* 3.1*   Recent Labs  Lab 08/28/18 2142  LIPASE 25   No results for input(s): AMMONIA in the last 168 hours.  Coagulation Profile: No results for input(s): INR, PROTIME in the last 168 hours.  Cardiac Enzymes: No results for input(s): CKTOTAL, CKMB, CKMBINDEX, TROPONINI in the last 168 hours.  BNP (last 3 results) No results for input(s): PROBNP in the last 8760 hours.  HbA1C: No results for input(s): HGBA1C in the last 72 hours.  CBG: No results for input(s): GLUCAP in the last 168 hours.  Lipid Profile: No results for input(s): CHOL, HDL, LDLCALC, TRIG, CHOLHDL, LDLDIRECT in the last 72 hours.  Thyroid Function Tests: No results for input(s): TSH, T4TOTAL, FREET4, T3FREE, THYROIDAB in the last 72 hours.  Anemia Panel: No results for input(s): VITAMINB12, FOLATE, FERRITIN, TIBC, IRON, RETICCTPCT in the last 72 hours.  Urine analysis: No results found for: COLORURINE, APPEARANCEUR, LABSPEC, PHURINE, GLUCOSEU, HGBUR, BILIRUBINUR, KETONESUR, [place], UROBILINOGEN, NITRITE, LEUKOCYTESUR  Sepsis Labs: Lactic Acid, Venous    Component Value Date/Time   LATICACIDVEN 1.86 08/28/2018 2153    MICROBIOLOGY: Recent Results (from the past 240  hour(s))  Blood Culture (routine x 2)     Status: None (Preliminary result)   Collection Time: 08/28/18  9:50 PM  Result Value Ref Range Status   Specimen Description BLOOD LEFT FOREARM  Final   Special Requests   Final    BOTTLES DRAWN AEROBIC AND ANAEROBIC Blood Culture adequate volume   Culture   Final    NO GROWTH 2 DAYS Performed at [facility], [place] [address], [place], [place] [address]    Report Status PENDING  Incomplete  Blood Culture (routine x 2)     Status: None (Preliminary result)   Collection Time: 08/28/18 10:00 PM  Result Value Ref Range Status   Specimen Description BLOOD RIGHT HAND  Final   Special Requests   Final    BOTTLES DRAWN AEROBIC AND ANAEROBIC Blood Culture adequate volume   Culture   Final    NO GROWTH 2 DAYS Performed at [facility], [place] [address] [place], [place] [address]  Report Status PENDING  Incomplete  MRSA PCR Screening     Status: None   Collection Time: 08/29/18  2:00 AM  Result Value Ref Range Status   MRSA by PCR NEGATIVE NEGATIVE Final    Comment:        The GeneXpert MRSA Assay (FDA approved for NASAL specimens only), is one component of a comprehensive MRSA colonization surveillance program. It is not intended to diagnose MRSA infection nor to guide or monitor treatment for MRSA infections. Performed at [facility], [place] [address], [place], [place] [address]     RADIOLOGY STUDIES/RESULTS: Dg Abd 1 View  Result Date: 08/29/2018 CLINICAL DATA:  Replacement of NG tube. EXAM: ABDOMEN - 1 VIEW COMPARISON:  One-view abdomen 08/29/2018 at 8:46 a.m. FINDINGS: Side port of the NG tube is beyond the GE junction. Mildly dilated loops of small bowel are similar the prior exam. No free air is present. IMPRESSION: 1. Side port of the NG tube is beyond the GE junction. 2. Similar appearance of multiple dilated loops of small bowel without evidence of free air. Electronically Signed   By: San Morelle M.D.    On: 08/29/2018 13:08   Dg Abd 1 View  Result Date: 08/29/2018 CLINICAL DATA:  Check nasogastric catheter placement EXAM: ABDOMEN - 1 VIEW COMPARISON:  None. FINDINGS: 30 seconds of fluoroscopy was utilized for placement. The nasogastric catheter is shown coiled within the gastric lumen. IMPRESSION: Nasogastric catheter placed in the stomach. Electronically Signed   By: Inez Catalina M.D.   On: 08/29/2018 09:08   Ct Abdomen Pelvis W Contrast  Result Date: 08/28/2018 CLINICAL DATA:  Abdominal pain with shortness of breath EXAM: CT ABDOMEN AND PELVIS WITH CONTRAST TECHNIQUE: Multidetector CT imaging of the abdomen and pelvis was performed using the standard protocol following bolus administration of intravenous contrast. CONTRAST:  182mL OMNIPAQUE IOHEXOL 300 MG/ML  SOLN COMPARISON:  None. FINDINGS: LOWER CHEST: There is no basilar pleural or apical pericardial effusion. HEPATOBILIARY: The hepatic contours and density are normal. There is no intra- or extrahepatic biliary dilatation. There is cholelithiasis without acute inflammation. PANCREAS: The pancreatic parenchymal contours are normal and there is no ductal dilatation. There is no peripancreatic fluid collection. SPLEEN: Normal. ADRENALS/URINARY TRACT: --Adrenal glands: Normal. --Right kidney/ureter: Mild atrophy. No hydronephrosis. --Left kidney/ureter: Vascular calcifications and interpolar nonobstructive 3 mm nephrolithiasis. No hydronephrosis. --Urinary bladder: Mild bladder wall thickening, possibly due to underdistention. STOMACH/BOWEL: --Stomach/Duodenum: There is no hiatal hernia or other gastric abnormality. The duodenal course and caliber are normal. --Small bowel: There are multiple loops of dilated small bowel in the central abdomen, predominantly anteriorly. One of these loops is directly beneath the skin surface, extending through a diastatic defect of the anterior abdominal muscles. Suspected transition point is in the anterior low midline  abdomen (coronal image 24 and sagittal image 70). --Colon: No focal abnormality. --Appendix: Not visualized. No right lower quadrant inflammation or free fluid. VASCULAR/LYMPHATIC: Atherosclerotic calcification is present within the non-aneurysmal abdominal aorta, without hemodynamically significant stenosis. The portal vein, splenic vein, superior mesenteric vein and IVC are patent. No abdominal or pelvic lymphadenopathy. REPRODUCTIVE: Normal prostate size with symmetric seminal vesicles. MUSCULOSKELETAL. Compression deformity of L2 is probably chronic. There is grade 1 L4-5 anterolisthesis secondary to facet arthrosis. OTHER: None. IMPRESSION: 1. Small bowel obstruction with suspected transition point in the low anterior midline abdomen. 2. Cholelithiasis without acute inflammation. 3. Nonobstructing left nephrolithiasis. Aortic Atherosclerosis (ICD10-I70.0). Electronically Signed   By: Cletus Gash.D.  On: 08/28/2018 23:21   Dg Chest Port 1 View  Result Date: 08/28/2018 CLINICAL DATA:  77 y/o  M; abdominal pain and shortness of breath. EXAM: PORTABLE CHEST 1 VIEW COMPARISON:  None. FINDINGS: Normal cardiac silhouette. Aortic atherosclerosis with calcification. Perihilar and basilar reticular opacities. No consolidation. No pleural effusion or pneumothorax. No acute osseous abnormality is evident. IMPRESSION: Basilar reticular opacities may represent mild interstitial edema or atypical pneumonia. No consolidation. Electronically Signed   By: Kristine Garbe M.D.   On: 08/28/2018 21:55   Dg Abd Portable 1v  Result Date: 08/29/2018 CLINICAL DATA:  Small bowel obstruction, 8 hour delayed film after Gastrografin EXAM: PORTABLE ABDOMEN - 1 VIEW COMPARISON:  Abdominal radiograph from earlier today FINDINGS: Enteric tube terminates in the proximal stomach. Moderately dilated small bowel loops throughout the abdomen are not appreciably changed. Oral contrast transits to the mid small bowel. No  definite oral contrast in the colon. Moderate colonic stool. No evidence of pneumatosis or pneumoperitoneum. Clear lung bases. IMPRESSION: 1. Enteric tube terminates in the stomach. 2. Stable moderately dilated small bowel loops compatible with small-bowel obstruction. Oral contrast transits to the mid small bowel, with no definite oral contrast in the colon. Electronically Signed   By: Ilona Sorrel M.D.   On: 08/29/2018 21:38   Dg Abd Portable 1v-small Bowel Obstruction Protocol-initial, 8 Hr Delay  Result Date: 08/29/2018 CLINICAL DATA:  Follow up small bowel obstruction EXAM: PORTABLE ABDOMEN - 1 VIEW COMPARISON:  08/28/2018 FINDINGS: Nasogastric catheter is now noted within the stomach. Fecal material is noted throughout the colon. Contrast is seen within the bladder from recent CT examination. Multiple dilated small bowel loops are again identified in the mid abdomen stable from the recent exam. No acute bony abnormality is noted. Prior L2 compression deformity is noted. IMPRESSION: Stable small bowel dilatation following nasogastric catheter placement Electronically Signed   By: Inez Catalina M.D.   On: 08/29/2018 09:09     LOS: 2 days   Oren Binet, MD  [facility]  If 7PM-7AM, please contact night-coverage  Please page via [email or web address]-Password [email or web address]-click on MD name and type text message  08/30/2018, 10:50 AM

## 2018-08-30 NOTE — Progress Notes (Signed)
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 ARF -Creatinine 1.28 Possible PNA  pSBO  - CT 11/26 w/ transition zone in lower midline  - NG tube placed in fluoro x 2  - SB protocol, 8h delay film showed contrast in SB, repeat AXR today at 1300 - OOB to chair/mobilize as able    FEN - NPO, IVF, NG tube to LIWS ID - Azithromycin 11/26 >>, Rocephin 11/26 >> ; afebrile this AM, WBC 11.0 VTE - SCD's  Plan: AXR at 1300, we will follow   LOS: 2 days    Subjective: CC: SBO  No nausea or abdominal pain. Family at bedside. No issues overnight.   Objective: Vital signs in last 24 hours: Temp:  [98.2 F (36.8 C)-99.1 F (37.3 C)] 98.2 F (36.8 C) (11/28 0453) Pulse Rate:  [77-101] 99 (11/28 0453) Resp:  [22-26] 22 (11/28 0453) BP: (155-168)/(71-78) 155/78 (11/28 0453) SpO2:  [92 %-95 %] 95 % (11/28 0453) Last BM Date: (P) 08/26/18  Intake/Output from previous day: 11/27 0701 - 11/28 0700 In: 1650.8 [I.V.:1300.8; IV Piggyback:350] Out: 1900 [Urine:100; Emesis/NG output:1800] Intake/Output this shift: No intake/output data recorded.  PE: Gen:  Alert, NAD, pleasant, cooperative HEENT: mandibular deformity from previous surgery, NGT in place with bilious output Abd: Soft, mild distention, +BS, large reducible ventral hernia, generalized TTP without guarding, no peritonitis  Skin: no rashes noted, warm and dry   Anti-infectives: Anti-infectives (From admission, onward)   Start     Dose/Rate Route Frequency Ordered Stop   08/28/18 2230  cefTRIAXone (ROCEPHIN) 2 g in sodium chloride 0.9 % 100 mL IVPB     2 g 200 mL/hr over 30 Minutes Intravenous Every 24 hours 08/28/18 2215     08/28/18 2230  azithromycin (ZITHROMAX) 500 mg in sodium chloride 0.9 % 250 mL IVPB     500 mg 250 mL/hr over 60 Minutes Intravenous Every 24 hours 08/28/18 2215        Lab Results:   Recent Labs    08/29/18 0329 08/30/18 0530  WBC 12.8* 11.0*  HGB 13.1 12.0*  HCT 41.2 37.9*  PLT 315 302   BMET Recent Labs    08/29/18 0329 08/30/18 0530  NA 137 141  K 4.1 3.9  CL 106 108  CO2 23 26  GLUCOSE 155* 130*  BUN 18 18  CREATININE 1.28* 1.42*  CALCIUM 8.8* 9.1   PT/INR No results for input(s): LABPROT, INR in the last 72 hours. CMP     Component Value Date/Time   NA 141 08/30/2018 0530   K 3.9 08/30/2018 0530   CL 108 08/30/2018 0530   CO2 26 08/30/2018 0530   GLUCOSE 130 (H) 08/30/2018 0530   BUN 18 08/30/2018 0530   CREATININE 1.42 (H) 08/30/2018 0530   CALCIUM 9.1 08/30/2018 0530   PROT 7.5 08/29/2018 0329   ALBUMIN 3.1 (L) 08/29/2018 0329   AST 19 08/29/2018 0329   ALT 13 08/29/2018 0329   ALKPHOS 71 08/29/2018 0329   BILITOT 0.6 08/29/2018 0329   GFRNONAA 48 (L) 08/30/2018 0530   GFRAA 55 (L) 08/30/2018 0530   Lipase     Component Value Date/Time   LIPASE 25 08/28/2018 2142    Studies/Results: Dg Abd 1 View  Result Date: 08/29/2018 CLINICAL DATA:  Replacement of NG tube. EXAM: ABDOMEN - 1 VIEW COMPARISON:  One-view abdomen 08/29/2018 at 8:46  a.m. FINDINGS: Side port of the NG tube is beyond the GE junction. Mildly dilated loops of small bowel are similar the prior exam. No free air is present. IMPRESSION: 1. Side port of the NG tube is beyond the GE junction. 2. Similar appearance of multiple dilated loops of small bowel without evidence of free air. Electronically Signed   By: San Morelle M.D.   On: 08/29/2018 13:08   Dg Abd 1 View  Result Date: 08/29/2018 CLINICAL DATA:  Check nasogastric catheter placement EXAM: ABDOMEN - 1 VIEW COMPARISON:  None. FINDINGS: 30 seconds of fluoroscopy was utilized for placement. The nasogastric catheter is shown coiled within the gastric lumen. IMPRESSION: Nasogastric catheter placed in the stomach. Electronically Signed   By: Inez Catalina M.D.   On: 08/29/2018 09:08   Ct Abdomen Pelvis W  Contrast  Result Date: 08/28/2018 CLINICAL DATA:  Abdominal pain with shortness of breath EXAM: CT ABDOMEN AND PELVIS WITH CONTRAST TECHNIQUE: Multidetector CT imaging of the abdomen and pelvis was performed using the standard protocol following bolus administration of intravenous contrast. CONTRAST:  160mL OMNIPAQUE IOHEXOL 300 MG/ML  SOLN COMPARISON:  None. FINDINGS: LOWER CHEST: There is no basilar pleural or apical pericardial effusion. HEPATOBILIARY: The hepatic contours and density are normal. There is no intra- or extrahepatic biliary dilatation. There is cholelithiasis without acute inflammation. PANCREAS: The pancreatic parenchymal contours are normal and there is no ductal dilatation. There is no peripancreatic fluid collection. SPLEEN: Normal. ADRENALS/URINARY TRACT: --Adrenal glands: Normal. --Right kidney/ureter: Mild atrophy. No hydronephrosis. --Left kidney/ureter: Vascular calcifications and interpolar nonobstructive 3 mm nephrolithiasis. No hydronephrosis. --Urinary bladder: Mild bladder wall thickening, possibly due to underdistention. STOMACH/BOWEL: --Stomach/Duodenum: There is no hiatal hernia or other gastric abnormality. The duodenal course and caliber are normal. --Small bowel: There are multiple loops of dilated small bowel in the central abdomen, predominantly anteriorly. One of these loops is directly beneath the skin surface, extending through a diastatic defect of the anterior abdominal muscles. Suspected transition point is in the anterior low midline abdomen (coronal image 24 and sagittal image 70). --Colon: No focal abnormality. --Appendix: Not visualized. No right lower quadrant inflammation or free fluid. VASCULAR/LYMPHATIC: Atherosclerotic calcification is present within the non-aneurysmal abdominal aorta, without hemodynamically significant stenosis. The portal vein, splenic vein, superior mesenteric vein and IVC are patent. No abdominal or pelvic lymphadenopathy. REPRODUCTIVE:  Normal prostate size with symmetric seminal vesicles. MUSCULOSKELETAL. Compression deformity of L2 is probably chronic. There is grade 1 L4-5 anterolisthesis secondary to facet arthrosis. OTHER: None. IMPRESSION: 1. Small bowel obstruction with suspected transition point in the low anterior midline abdomen. 2. Cholelithiasis without acute inflammation. 3. Nonobstructing left nephrolithiasis. Aortic Atherosclerosis (ICD10-I70.0). Electronically Signed   By: Ulyses Jarred M.D.   On: 08/28/2018 23:21   Dg Chest Port 1 View  Result Date: 08/28/2018 CLINICAL DATA:  77 y/o  M; abdominal pain and shortness of breath. EXAM: PORTABLE CHEST 1 VIEW COMPARISON:  None. FINDINGS: Normal cardiac silhouette. Aortic atherosclerosis with calcification. Perihilar and basilar reticular opacities. No consolidation. No pleural effusion or pneumothorax. No acute osseous abnormality is evident. IMPRESSION: Basilar reticular opacities may represent mild interstitial edema or atypical pneumonia. No consolidation. Electronically Signed   By: Kristine Garbe M.D.   On: 08/28/2018 21:55   Dg Abd Portable 1v  Result Date: 08/29/2018 CLINICAL DATA:  Small bowel obstruction, 8 hour delayed film after Gastrografin EXAM: PORTABLE ABDOMEN - 1 VIEW COMPARISON:  Abdominal radiograph from earlier today FINDINGS: Enteric tube terminates in the proximal  stomach. Moderately dilated small bowel loops throughout the abdomen are not appreciably changed. Oral contrast transits to the mid small bowel. No definite oral contrast in the colon. Moderate colonic stool. No evidence of pneumatosis or pneumoperitoneum. Clear lung bases. IMPRESSION: 1. Enteric tube terminates in the stomach. 2. Stable moderately dilated small bowel loops compatible with small-bowel obstruction. Oral contrast transits to the mid small bowel, with no definite oral contrast in the colon. Electronically Signed   By: Ilona Sorrel M.D.   On: 08/29/2018 21:38   Dg Abd  Portable 1v-small Bowel Obstruction Protocol-initial, 8 Hr Delay  Result Date: 08/29/2018 CLINICAL DATA:  Follow up small bowel obstruction EXAM: PORTABLE ABDOMEN - 1 VIEW COMPARISON:  08/28/2018 FINDINGS: Nasogastric catheter is now noted within the stomach. Fecal material is noted throughout the colon. Contrast is seen within the bladder from recent CT examination. Multiple dilated small bowel loops are again identified in the mid abdomen stable from the recent exam. No acute bony abnormality is noted. Prior L2 compression deformity is noted. IMPRESSION: Stable small bowel dilatation following nasogastric catheter placement Electronically Signed   By: Inez Catalina M.D.   On: 08/29/2018 09:09      Kalman Drape , [facility] 08/30/2018, 9:58 AM  Pager: [phone] Mon-Wed, Friday 7:00am-4:30pm Thurs 7am-11:30am  Consults: [phone]

## 2018-08-31 LAB — CBC
HEMATOCRIT: 40.8 % (ref 39.0–52.0)
Hemoglobin: 12.5 g/dL — ABNORMAL LOW (ref 13.0–17.0)
MCH: 27.9 pg (ref 26.0–34.0)
MCHC: 30.6 g/dL (ref 30.0–36.0)
MCV: 91.1 fL (ref 80.0–100.0)
NRBC: 0 % (ref 0.0–0.2)
Platelets: 299 10*3/uL (ref 150–400)
RBC: 4.48 MIL/uL (ref 4.22–5.81)
RDW: 14 % (ref 11.5–15.5)
WBC: 9.3 10*3/uL (ref 4.0–10.5)

## 2018-08-31 LAB — BASIC METABOLIC PANEL
ANION GAP: 8 (ref 5–15)
BUN: 25 mg/dL — ABNORMAL HIGH (ref 8–23)
CO2: 23 mmol/L (ref 22–32)
Calcium: 9 mg/dL (ref 8.9–10.3)
Chloride: 110 mmol/L (ref 98–111)
Creatinine, Ser: 1.34 mg/dL — ABNORMAL HIGH (ref 0.61–1.24)
GFR calc Af Amer: 59 mL/min — ABNORMAL LOW (ref >60)
GFR calc non Af Amer: 51 mL/min — ABNORMAL LOW (ref >60)
GLUCOSE: 113 mg/dL — AB (ref 70–99)
Potassium: 4.2 mmol/L (ref 3.5–5.1)
Sodium: 141 mmol/L (ref 135–145)

## 2018-08-31 LAB — GLUCOSE, CAPILLARY: Glucose-Capillary: 94 mg/dL (ref 70–99)

## 2018-08-31 MED ORDER — PROMETHAZINE HCL 25 MG/ML IJ SOLN
12.5000 mg | Freq: Once | INTRAMUSCULAR | Status: AC
  Administered 2018-08-31: 12.5 mg via INTRAVENOUS
  Filled 2018-08-31: qty 1

## 2018-08-31 NOTE — Evaluation (Addendum)
Physical Therapy Evaluation Patient Details Name: Luis Lyons MRN: [id number] DOB: 1941/08/06 Today's Date: 08/31/2018   History of Present Illness  Pt is a 77 y/o male admitted secondary to abdominal pain and SOB. Pt found to have a SBO and had an NG tube placed. PMH including but not limited to CVA with L sided weakness, prostate cancer and mouth cancer.   Clinical Impression  Pt presented supine in bed with HOB elevated, awake and willing to participate in therapy session. Prior to admission, pt reported that he was living at a rehab facility (unable to state which one), where he used a w/c for mobility. He reported that he was able to perform transfers independently. No family/caregivers present to confirm information provided. Pt currently requires max A x2 for bed mobility. He was very limited this session with functional mobility secondary to abdominal pain and fatigue. Pt's NG tube also not well secured, therefore further mobility was deferred. Pt's RN was notified. Pt would continue to benefit from skilled physical therapy services at this time while admitted and after d/c to address the below listed limitations in order to improve overall safety and independence with functional mobility.     Follow Up Recommendations SNF    Equipment Recommendations  None recommended by PT    Recommendations for Other Services       Precautions / Restrictions Precautions Precautions: Fall Precaution Comments: NG tube Restrictions Weight Bearing Restrictions: No      Mobility  Bed Mobility Overal bed mobility: Needs Assistance Bed Mobility: Rolling Rolling: Max assist;+2 for physical assistance         General bed mobility comments: max A x2 to roll bilaterally with use of bed pads for pericare; multimodal cueing required for sequencing; pt able to minimally assist with UEs on bed rails  Transfers                 General transfer comment: pt declining further mobility  secondary to abdominal pain; pt's NG tube also not secured well - RN notified  Ambulation/Gait                Stairs            Wheelchair Mobility    Modified Rankin (Stroke Patients Only)       Balance                                             Pertinent Vitals/Pain Pain Assessment: Faces Faces Pain Scale: Hurts even more Pain Location: stomach Pain Descriptors / Indicators: Grimacing;Guarding Pain Intervention(s): Monitored during session;Repositioned    Home Living Family/patient expects to be discharged to:: Skilled nursing facility                 Additional Comments: per pt from a rehab facility in [place]? - pt's speech was difficult to understand    Prior Function Level of Independence: Needs assistance   Gait / Transfers Assistance Needed: pt reported that he uses a w/c, can perform transfers without assist  ADL's / Homemaking Assistance Needed: requires assistance        Hand Dominance        Extremity/Trunk Assessment   Upper Extremity Assessment Upper Extremity Assessment: Generalized weakness    Lower Extremity Assessment Lower Extremity Assessment: Generalized weakness       Communication   Communication: Other (comment)(difficult to  understand his speech)  Cognition Arousal/Alertness: Awake/alert Behavior During Therapy: Flat affect Overall Cognitive Status: Impaired/Different from baseline Area of Impairment: Following commands;Safety/judgement;Problem solving                       Following Commands: Follows one step commands with increased time Safety/Judgement: Decreased awareness of deficits;Decreased awareness of safety   Problem Solving: Slow processing;Decreased initiation;Requires verbal cues;Difficulty sequencing;Requires tactile cues        General Comments      Exercises     Assessment/Plan    PT Assessment Patient needs continued PT services  PT Problem List  Decreased strength;Decreased activity tolerance;Decreased balance;Decreased mobility;Decreased coordination;Decreased cognition;Decreased knowledge of use of DME;Decreased safety awareness;Decreased knowledge of precautions;Pain       PT Treatment Interventions DME instruction;Gait training;Stair training;Functional mobility training;Therapeutic activities;Therapeutic exercise;Balance training;Neuromuscular re-education;Cognitive remediation;Patient/family education    PT Goals (Current goals can be found in the Care Plan section)  Acute Rehab PT Goals Patient Stated Goal: decrease pain PT Goal Formulation: With patient Time For Goal Achievement: 09/14/18 Potential to Achieve Goals: Fair    Frequency Min 2X/week   Barriers to discharge        Co-evaluation               AM-PAC PT "6 Clicks" Mobility  Outcome Measure Help needed turning from your back to your side while in a flat bed without using bedrails?: A Lot Help needed moving from lying on your back to sitting on the side of a flat bed without using bedrails?: A Lot Help needed moving to and from a bed to a chair (including a wheelchair)?: A Lot Help needed standing up from a chair using your arms (e.g., wheelchair or bedside chair)?: A Lot Help needed to walk in hospital room?: Total Help needed climbing 3-5 steps with a railing? : Total 6 Click Score: 10    End of Session Equipment Utilized During Treatment: Oxygen Activity Tolerance: Patient limited by fatigue;Patient limited by pain Patient left: in bed;with call bell/phone within reach;with bed alarm set Nurse Communication: Mobility status PT Visit Diagnosis: Other abnormalities of gait and mobility (R26.89);Pain Pain - part of body: (abdomen)    Time: 5300-5110 PT Time Calculation (min) (ACUTE ONLY): 19 min   Charges:   PT Evaluation $PT Eval Moderate Complexity: 1 Mod          Sherie Don, PT, DPT  Acute Rehabilitation Services Pager  [phone] Office [place] 08/31/2018, 10:15 AM

## 2018-08-31 NOTE — Progress Notes (Signed)
PROGRESS NOTE        PATIENT DETAILS Name: Luis Lyons Age: 77 y.o. Sex: male Date of Birth: 1941-08-15 Admit Date: 08/28/2018 Admitting Physician [name], MD OYD:XAJOIN, [name], MD  Brief Narrative: 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  Upon further evaluation he was found to have pneumonia and a small bowel obstruction.  See below for further details  Subjective: Claims he vomited x1 last night.  No BM or flatus  Assessment/Plan: SBO: Remains without BM/flatus-significant NG tube output overnight.  Imaging studies shows persistent small bowel obstruction.  Continue n.p.o. status, NG tube decompression under supportive care.  General surgery following and contemplating surgery if no improvement in the next day or so.    Pneumonia: Suspect that this is probably aspiration pneumonia-seems to be responding to current antimicrobial regimen.  Afebrile and does not look acutely sick.    AKI: Likely hemodynamically mediated-creatinine downtrending with supportive care.  Follow.      History of CVA with chronic left-sided weakness: Remains at baseline-resume aspirin once oral intake has been resumed.    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  Antimicrobial agents: Anti-infectives (From admission, onward)   Start     Dose/Rate Route Frequency Ordered Stop   08/28/18 2230   cefTRIAXone (ROCEPHIN) 2 g in sodium chloride 0.9 % 100 mL IVPB     2 g 200 mL/hr over 30 Minutes Intravenous Every 24 hours 08/28/18 2215     08/28/18 2230  azithromycin (ZITHROMAX) 500 mg in sodium chloride 0.9 % 250 mL IVPB     500 mg 250 mL/hr over 60 Minutes Intravenous Every 24 hours 08/28/18 2215 09/01/18 2229      Procedures: None  CONSULTS:  general surgery  Time spent: 25- minutes-Greater than 50% of this time was spent in counseling, explanation of diagnosis, planning of further management, and coordination of care.  MEDICATIONS: Scheduled Meds: . enoxaparin (LOVENOX) injection  40 mg Subcutaneous Q24H  . ipratropium-albuterol  3 mL Nebulization BID  . mouth rinse  15 mL Mouth Rinse BID   Continuous Infusions: . 0.9 % NaCl with KCl 20 mEq / L 125 mL/hr at 08/31/18 0919  . azithromycin Stopped (08/31/18 0043)  . cefTRIAXone (ROCEPHIN)  IV Stopped (08/30/18 2315)   PRN Meds:.acetaminophen **OR** acetaminophen, albuterol, hydrALAZINE, morphine injection, ondansetron **OR** ondansetron (ZOFRAN) IV   PHYSICAL EXAM: Vital signs: Vitals:   08/30/18 2248 08/31/18 0402 08/31/18 0457 08/31/18 0916  BP: (!) 184/72  (!) 187/85   Pulse:   (!) 101 (!) 102  Resp:    19  Temp:   98.2 F (36.8 C)   TempSrc:   Oral   SpO2:   98% 99%  Weight:  96 kg    Height:       Filed Weights   08/28/18 2130 08/29/18 0126 08/31/18 0402  Weight: 90.7 kg 98.2 kg 96 kg   Body  mass index is 27.92 kg/m.   General appearance:Awake, alert, not in any distress.  Eyes:no scleral icterus. Neck: supple, no JVD. Resp:Good air entry bilaterally, scattered rhonchi heard anteriorly CVS: S1 S2 regular, no murmurs.  GI: Bowel sounds present, lightly distended-very sluggish bowel sounds.  No peritoneal signs. Extremities: B/L Lower Ext shows no edema, both legs are warm to touch Neurology:  Non focal Psychiatric: Normal judgment and insight. Normal mood. Musculoskeletal:No digital  cyanosis Skin:No Rash, warm and dry Wounds:N/A  I have personally reviewed following labs and imaging studies  LABORATORY DATA: CBC: Recent Labs  Lab 08/28/18 2142 08/29/18 0329 08/30/18 0530 08/31/18 0544  WBC 15.7* 12.8* 11.0* 9.3  NEUTROABS 13.7*  --   --   --   HGB 12.8* 13.1 12.0* 12.5*  HCT 41.5 41.2 37.9* 40.8  MCV 89.6 89.0 89.6 91.1  PLT 346 315 302 170    Basic Metabolic Panel: Recent Labs  Lab 08/28/18 2142 08/29/18 0329 08/30/18 0530 08/31/18 0544  NA 136 137 141 141  K 4.2 4.1 3.9 4.2  CL 104 106 108 110  CO2 25 23 26 23   GLUCOSE 184* 155* 130* 113*  BUN 17 18 18  25*  CREATININE 1.35* 1.28* 1.42* 1.34*  CALCIUM 9.0 8.8* 9.1 9.0    GFR: Estimated Creatinine Clearance: 57.2 mL/min (A) (by C-G formula based on SCr of 1.34 mg/dL (H)).  Liver Function Tests: Recent Labs  Lab 08/28/18 2142 08/29/18 0329  AST 16 19  ALT 13 13  ALKPHOS 76 71  BILITOT 0.8 0.6  PROT 7.8 7.5  ALBUMIN 3.2* 3.1*   Recent Labs  Lab 08/28/18 2142  LIPASE 25   No results for input(s): AMMONIA in the last 168 hours.  Coagulation Profile: No results for input(s): INR, PROTIME in the last 168 hours.  Cardiac Enzymes: No results for input(s): CKTOTAL, CKMB, CKMBINDEX, TROPONINI in the last 168 hours.  BNP (last 3 results) No results for input(s): PROBNP in the last 8760 hours.  HbA1C: No results for input(s): HGBA1C in the last 72 hours.  CBG: Recent Labs  Lab 08/31/18 1044  GLUCAP 94    Lipid Profile: No results for input(s): CHOL, HDL, LDLCALC, TRIG, CHOLHDL, LDLDIRECT in the last 72 hours.  Thyroid Function Tests: No results for input(s): TSH, T4TOTAL, FREET4, T3FREE, THYROIDAB in the last 72 hours.  Anemia Panel: No results for input(s): VITAMINB12, FOLATE, FERRITIN, TIBC, IRON, RETICCTPCT in the last 72 hours.  Urine analysis:    Component Value Date/Time   COLORURINE YELLOW 08/30/2018 1245   APPEARANCEUR HAZY (A) 08/30/2018 1245   LABSPEC  1.027 08/30/2018 1245   PHURINE 5.0 08/30/2018 1245   GLUCOSEU NEGATIVE 08/30/2018 1245   HGBUR SMALL (A) 08/30/2018 1245   BILIRUBINUR NEGATIVE 08/30/2018 1245   KETONESUR 5 (A) 08/30/2018 1245   PROTEINUR 30 (A) 08/30/2018 1245   NITRITE POSITIVE (A) 08/30/2018 1245   LEUKOCYTESUR MODERATE (A) 08/30/2018 1245    Sepsis Labs: Lactic Acid, Venous    Component Value Date/Time   LATICACIDVEN 1.86 08/28/2018 2153    MICROBIOLOGY: Recent Results (from the past 240 hour(s))  Blood Culture (routine x 2)     Status: None (Preliminary result)   Collection Time: 08/28/18  9:50 PM  Result Value Ref Range Status   Specimen Description BLOOD LEFT FOREARM  Final   Special Requests   Final    BOTTLES DRAWN AEROBIC AND ANAEROBIC Blood Culture adequate volume   Culture   Final    NO  GROWTH 3 DAYS Performed at [facility], [place] [address] [place], [place] [address]    Report Status PENDING  Incomplete  Blood Culture (routine x 2)     Status: None (Preliminary result)   Collection Time: 08/28/18 10:00 PM  Result Value Ref Range Status   Specimen Description BLOOD RIGHT HAND  Final   Special Requests   Final    BOTTLES DRAWN AEROBIC AND ANAEROBIC Blood Culture adequate volume   Culture   Final    NO GROWTH 3 DAYS Performed at [facility], [place] [address], [place], [place] [address]    Report Status PENDING  Incomplete  MRSA PCR Screening     Status: None   Collection Time: 08/29/18  2:00 AM  Result Value Ref Range Status   MRSA by PCR NEGATIVE NEGATIVE Final    Comment:        The GeneXpert MRSA Assay (FDA approved for NASAL specimens only), is one component of a comprehensive MRSA colonization surveillance program. It is not intended to diagnose MRSA infection nor to guide or monitor treatment for MRSA infections. Performed at [facility], [place] [address], [place],  [address]     RADIOLOGY STUDIES/RESULTS: Dg Abd 1 View  Result Date:  08/30/2018 CLINICAL DATA:  Small bowel obstruction EXAM: ABDOMEN - 1 VIEW COMPARISON:  August 29, 2018 FINDINGS: The NG tube terminates in the proximal stomach. Small bowel dilatation is less prominent on this study but remains. No other acute abnormalities. IMPRESSION: The small bowel dilatation is less prominent but remains consistent with persistent small-bowel obstruction. The enteric tube appears to terminate in the proximal stomach. Electronically Signed   By: Dorise Bullion III M.D   On: 08/30/2018 13:22   Dg Abd 1 View  Result Date: 08/29/2018 CLINICAL DATA:  Replacement of NG tube. EXAM: ABDOMEN - 1 VIEW COMPARISON:  One-view abdomen 08/29/2018 at 8:46 a.m. FINDINGS: Side port of the NG tube is beyond the GE junction. Mildly dilated loops of small bowel are similar the prior exam. No free air is present. IMPRESSION: 1. Side port of the NG tube is beyond the GE junction. 2. Similar appearance of multiple dilated loops of small bowel without evidence of free air. Electronically Signed   By: San Morelle M.D.   On: 08/29/2018 13:08   Dg Abd 1 View  Result Date: 08/29/2018 CLINICAL DATA:  Check nasogastric catheter placement EXAM: ABDOMEN - 1 VIEW COMPARISON:  None. FINDINGS: 30 seconds of fluoroscopy was utilized for placement. The nasogastric catheter is shown coiled within the gastric lumen. IMPRESSION: Nasogastric catheter placed in the stomach. Electronically Signed   By: Inez Catalina M.D.   On: 08/29/2018 09:08   Ct Abdomen Pelvis W Contrast  Result Date: 08/28/2018 CLINICAL DATA:  Abdominal pain with shortness of breath EXAM: CT ABDOMEN AND PELVIS WITH CONTRAST TECHNIQUE: Multidetector CT imaging of the abdomen and pelvis was performed using the standard protocol following bolus administration of intravenous contrast. CONTRAST:  180mL OMNIPAQUE IOHEXOL 300 MG/ML  SOLN COMPARISON:  None. FINDINGS: LOWER CHEST: There is no basilar pleural or apical pericardial effusion.  HEPATOBILIARY: The hepatic contours and density are normal. There is no intra- or extrahepatic biliary dilatation. There is cholelithiasis without acute inflammation. PANCREAS: The pancreatic parenchymal contours are normal and there is no ductal dilatation. There is no peripancreatic fluid collection. SPLEEN: Normal. ADRENALS/URINARY TRACT: --Adrenal glands: Normal. --Right kidney/ureter: Mild atrophy. No hydronephrosis. --Left kidney/ureter: Vascular calcifications and interpolar nonobstructive  3 mm nephrolithiasis. No hydronephrosis. --Urinary bladder: Mild bladder wall thickening, possibly due to underdistention. STOMACH/BOWEL: --Stomach/Duodenum: There is no hiatal hernia or other gastric abnormality. The duodenal course and caliber are normal. --Small bowel: There are multiple loops of dilated small bowel in the central abdomen, predominantly anteriorly. One of these loops is directly beneath the skin surface, extending through a diastatic defect of the anterior abdominal muscles. Suspected transition point is in the anterior low midline abdomen (coronal image 24 and sagittal image 70). --Colon: No focal abnormality. --Appendix: Not visualized. No right lower quadrant inflammation or free fluid. VASCULAR/LYMPHATIC: Atherosclerotic calcification is present within the non-aneurysmal abdominal aorta, without hemodynamically significant stenosis. The portal vein, splenic vein, superior mesenteric vein and IVC are patent. No abdominal or pelvic lymphadenopathy. REPRODUCTIVE: Normal prostate size with symmetric seminal vesicles. MUSCULOSKELETAL. Compression deformity of L2 is probably chronic. There is grade 1 L4-5 anterolisthesis secondary to facet arthrosis. OTHER: None. IMPRESSION: 1. Small bowel obstruction with suspected transition point in the low anterior midline abdomen. 2. Cholelithiasis without acute inflammation. 3. Nonobstructing left nephrolithiasis. Aortic Atherosclerosis (ICD10-I70.0). Electronically  Signed   By: Ulyses Jarred M.D.   On: 08/28/2018 23:21   Dg Chest Port 1 View  Result Date: 08/28/2018 CLINICAL DATA:  77 y/o  M; abdominal pain and shortness of breath. EXAM: PORTABLE CHEST 1 VIEW COMPARISON:  None. FINDINGS: Normal cardiac silhouette. Aortic atherosclerosis with calcification. Perihilar and basilar reticular opacities. No consolidation. No pleural effusion or pneumothorax. No acute osseous abnormality is evident. IMPRESSION: Basilar reticular opacities may represent mild interstitial edema or atypical pneumonia. No consolidation. Electronically Signed   By: Kristine Garbe M.D.   On: 08/28/2018 21:55   Dg Abd Portable 1v  Result Date: 08/29/2018 CLINICAL DATA:  Small bowel obstruction, 8 hour delayed film after Gastrografin EXAM: PORTABLE ABDOMEN - 1 VIEW COMPARISON:  Abdominal radiograph from earlier today FINDINGS: Enteric tube terminates in the proximal stomach. Moderately dilated small bowel loops throughout the abdomen are not appreciably changed. Oral contrast transits to the mid small bowel. No definite oral contrast in the colon. Moderate colonic stool. No evidence of pneumatosis or pneumoperitoneum. Clear lung bases. IMPRESSION: 1. Enteric tube terminates in the stomach. 2. Stable moderately dilated small bowel loops compatible with small-bowel obstruction. Oral contrast transits to the mid small bowel, with no definite oral contrast in the colon. Electronically Signed   By: Ilona Sorrel M.D.   On: 08/29/2018 21:38   Dg Abd Portable 1v-small Bowel Obstruction Protocol-initial, 8 Hr Delay  Result Date: 08/29/2018 CLINICAL DATA:  Follow up small bowel obstruction EXAM: PORTABLE ABDOMEN - 1 VIEW COMPARISON:  08/28/2018 FINDINGS: Nasogastric catheter is now noted within the stomach. Fecal material is noted throughout the colon. Contrast is seen within the bladder from recent CT examination. Multiple dilated small bowel loops are again identified in the mid abdomen  stable from the recent exam. No acute bony abnormality is noted. Prior L2 compression deformity is noted. IMPRESSION: Stable small bowel dilatation following nasogastric catheter placement Electronically Signed   By: Inez Catalina M.D.   On: 08/29/2018 09:09     LOS: 3 days   Oren Binet, MD  [facility]  If 7PM-7AM, please contact night-coverage  Please page via [email or web address]-Password [email or web address]-click on MD name and type text message  08/31/2018, 1:14 PM

## 2018-08-31 NOTE — Progress Notes (Signed)
CSW notes patient resides under long term care at [facility].   Percell Locus Rozina Pointer LCSW [phone]

## 2018-08-31 NOTE — Plan of Care (Signed)

## 2018-08-31 NOTE — Progress Notes (Addendum)
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 NGT out put still high - OOBto chair/mobilize as able  FEN - NPO, IVF, NG tube to LIWS ID - Azithromycin 11/26 >>, Rocephin 11/26 >> ; afebrile this AM, WBC 11.0 VTE - SCD's  Plan: reviewed AXR and hard to see if contrast in colon on AXR. Pt states flatus but no BM. NGT output still high. He will likely need to be explored if he does not open up in the next day or so. Repeat AXR tomorrow morning and will make definitive decision about surgery then.    LOS: 3 days    Subjective: CC: SBO  Mild abdominal pain. Pt states he is having flatus. No BM. No family at bedside.   Objective: Vital signs in last 24 hours: Temp:  [98.2 F (36.8 C)-98.4 F (36.9 C)] 98.2 F (36.8 C) (11/29 0457) Pulse Rate:  [96-107] 101 (11/29 0457) Resp:  [20] 20 (11/28 1433) BP: (172-192)/(72-85) 187/85 (11/29 0457) SpO2:  [96 %-98 %] 98 % (11/29 0457) FiO2 (%):  [96 %] 96 % (11/28 2020) Weight:  [96 kg] 96 kg (11/29 0402) Last BM Date: 08/26/18  Intake/Output from previous day: 11/28 0701 - 11/29 0700 In: -  Out: 1400 [Emesis/NG output:1400] Intake/Output this shift: No intake/output data recorded.  PE: Gen:  Alert, NAD, pleasant, cooperative HEENT: mandibular deformity from previous surgery, NGT in place with bilious output Abd: Soft, mild distention, +BS, large reducible ventral hernia, generalized TTP without guarding, no peritonitis  Skin: no rashes noted, warm and dry   Anti-infectives: Anti-infectives (From admission, onward)   Start     Dose/Rate Route Frequency Ordered Stop   08/28/18 2230   cefTRIAXone (ROCEPHIN) 2 g in sodium chloride 0.9 % 100 mL IVPB     2 g 200 mL/hr over 30 Minutes Intravenous Every 24 hours 08/28/18 2215     08/28/18 2230  azithromycin (ZITHROMAX) 500 mg in sodium chloride 0.9 % 250 mL IVPB     500 mg 250 mL/hr over 60 Minutes Intravenous Every 24 hours 08/28/18 2215        Lab Results:  Recent Labs    08/30/18 0530 08/31/18 0544  WBC 11.0* 9.3  HGB 12.0* 12.5*  HCT 37.9* 40.8  PLT 302 299   BMET Recent Labs    08/30/18 0530 08/31/18 0544  NA 141 141  K 3.9 4.2  CL 108 110  CO2 26 23  GLUCOSE 130* 113*  BUN 18 25*  CREATININE 1.42* 1.34*  CALCIUM 9.1 9.0   PT/INR No results for input(s): LABPROT, INR in the last 72 hours. CMP     Component Value Date/Time   NA 141 08/31/2018 0544   K 4.2 08/31/2018 0544   CL 110 08/31/2018 0544   CO2 23 08/31/2018 0544   GLUCOSE 113 (H) 08/31/2018 0544   BUN 25 (H) 08/31/2018 0544   CREATININE 1.34 (H) 08/31/2018 0544   CALCIUM 9.0 08/31/2018 0544   PROT 7.5 08/29/2018 0329   ALBUMIN 3.1 (L) 08/29/2018 0329   AST 19 08/29/2018 0329   ALT 13 08/29/2018 0329   ALKPHOS  71 08/29/2018 0329   BILITOT 0.6 08/29/2018 0329   GFRNONAA 51 (L) 08/31/2018 0544   GFRAA 59 (L) 08/31/2018 0544   Lipase     Component Value Date/Time   LIPASE 25 08/28/2018 2142    Studies/Results: Dg Abd 1 View  Result Date: 08/30/2018 CLINICAL DATA:  Small bowel obstruction EXAM: ABDOMEN - 1 VIEW COMPARISON:  August 29, 2018 FINDINGS: The NG tube terminates in the proximal stomach. Small bowel dilatation is less prominent on this study but remains. No other acute abnormalities. IMPRESSION: The small bowel dilatation is less prominent but remains consistent with persistent small-bowel obstruction. The enteric tube appears to terminate in the proximal stomach. Electronically Signed   By: Dorise Bullion III M.D   On: 08/30/2018 13:22   Dg Abd 1 View  Result Date: 08/29/2018 CLINICAL DATA:  Replacement of NG  tube. EXAM: ABDOMEN - 1 VIEW COMPARISON:  One-view abdomen 08/29/2018 at 8:46 a.m. FINDINGS: Side port of the NG tube is beyond the GE junction. Mildly dilated loops of small bowel are similar the prior exam. No free air is present. IMPRESSION: 1. Side port of the NG tube is beyond the GE junction. 2. Similar appearance of multiple dilated loops of small bowel without evidence of free air. Electronically Signed   By: San Morelle M.D.   On: 08/29/2018 13:08   Dg Abd Portable 1v  Result Date: 08/29/2018 CLINICAL DATA:  Small bowel obstruction, 8 hour delayed film after Gastrografin EXAM: PORTABLE ABDOMEN - 1 VIEW COMPARISON:  Abdominal radiograph from earlier today FINDINGS: Enteric tube terminates in the proximal stomach. Moderately dilated small bowel loops throughout the abdomen are not appreciably changed. Oral contrast transits to the mid small bowel. No definite oral contrast in the colon. Moderate colonic stool. No evidence of pneumatosis or pneumoperitoneum. Clear lung bases. IMPRESSION: 1. Enteric tube terminates in the stomach. 2. Stable moderately dilated small bowel loops compatible with small-bowel obstruction. Oral contrast transits to the mid small bowel, with no definite oral contrast in the colon. Electronically Signed   By: Ilona Sorrel M.D.   On: 08/29/2018 21:38   Dg Abd Portable 1v-small Bowel Obstruction Protocol-initial, 8 Hr Delay  Result Date: 08/29/2018 CLINICAL DATA:  Follow up small bowel obstruction EXAM: PORTABLE ABDOMEN - 1 VIEW COMPARISON:  08/28/2018 FINDINGS: Nasogastric catheter is now noted within the stomach. Fecal material is noted throughout the colon. Contrast is seen within the bladder from recent CT examination. Multiple dilated small bowel loops are again identified in the mid abdomen stable from the recent exam. No acute bony abnormality is noted. Prior L2 compression deformity is noted. IMPRESSION: Stable small bowel dilatation following nasogastric  catheter placement Electronically Signed   By: Inez Catalina M.D.   On: 08/29/2018 09:09      Kalman Drape , [facility] 08/31/2018, 8:32 AM  Pager: [phone] Mon-Wed, Friday 7:00am-4:30pm Thurs 7am-11:30am  Consults: [phone]

## 2018-09-01 ENCOUNTER — Inpatient Hospital Stay: Payer: Self-pay

## 2018-09-01 ENCOUNTER — Inpatient Hospital Stay (HOSPITAL_COMMUNITY): Payer: Medicare Other

## 2018-09-01 LAB — CBC
HCT: 37.9 % — ABNORMAL LOW (ref 39.0–52.0)
Hemoglobin: 11.2 g/dL — ABNORMAL LOW (ref 13.0–17.0)
MCH: 27.1 pg (ref 26.0–34.0)
MCHC: 29.6 g/dL — ABNORMAL LOW (ref 30.0–36.0)
MCV: 91.5 fL (ref 80.0–100.0)
PLATELETS: 309 10*3/uL (ref 150–400)
RBC: 4.14 MIL/uL — AB (ref 4.22–5.81)
RDW: 14.2 % (ref 11.5–15.5)
WBC: 9.9 10*3/uL (ref 4.0–10.5)
nRBC: 0 % (ref 0.0–0.2)

## 2018-09-01 LAB — BASIC METABOLIC PANEL
Anion gap: 11 (ref 5–15)
BUN: 27 mg/dL — AB (ref 8–23)
CO2: 22 mmol/L (ref 22–32)
CREATININE: 1.15 mg/dL (ref 0.61–1.24)
Calcium: 8.6 mg/dL — ABNORMAL LOW (ref 8.9–10.3)
Chloride: 110 mmol/L (ref 98–111)
GFR calc Af Amer: >60 mL/min (ref >60)
Glucose, Bld: 98 mg/dL (ref 70–99)
POTASSIUM: 4 mmol/L (ref 3.5–5.1)
Sodium: 143 mmol/L (ref 135–145)

## 2018-09-01 LAB — PREALBUMIN: Prealbumin: 11.4 mg/dL — ABNORMAL LOW (ref 18–38)

## 2018-09-01 MED ORDER — POLYVINYL ALCOHOL 1.4 % OP SOLN
1.0000 [drp] | Freq: Two times a day (BID) | OPHTHALMIC | Status: DC
  Administered 2018-09-01 – 2018-09-13 (×24): 1 [drp] via OPHTHALMIC
  Filled 2018-09-01: qty 15

## 2018-09-01 MED ORDER — FUROSEMIDE 10 MG/ML IJ SOLN
40.0000 mg | Freq: Once | INTRAMUSCULAR | Status: AC
  Administered 2018-09-01: 40 mg via INTRAVENOUS
  Filled 2018-09-01: qty 4

## 2018-09-01 MED ORDER — SODIUM CHLORIDE 0.9% FLUSH
10.0000 mL | INTRAVENOUS | Status: DC | PRN
  Administered 2018-09-04 – 2018-09-11 (×3): 10 mL
  Filled 2018-09-01 (×3): qty 40

## 2018-09-01 MED ORDER — METOPROLOL TARTRATE 5 MG/5ML IV SOLN
5.0000 mg | Freq: Two times a day (BID) | INTRAVENOUS | Status: AC | PRN
  Administered 2018-09-01 – 2018-09-02 (×2): 5 mg via INTRAVENOUS
  Filled 2018-09-01 (×2): qty 5

## 2018-09-01 NOTE — Progress Notes (Addendum)
Initial Nutrition Assessment  DOCUMENTATION CODES:  Not applicable  INTERVENTION:  TPN per pharmacy-spoke w/ PharmD, order just missed deadline today, will have to begin 12/1. Has Access via R PICC double lumen  Pt notes no intake except ice chips since last Friday. Also does not appear to have received any dextrose containing IVF since admit -> will be at high risk for electrolyte abnormalities. Recommend checking current phos/mag now and then following serially after TPN initiation     Would consider hanging D10 w/ lytes in interim, should help soften anabolic response/electrolyte shifts when TPN started    Highly recommend thiamin IV supplementation 100mg  BID to help reduce chance of acute thiamine deficiency/Werknicke's w/ initiation of TPN  NUTRITION DIAGNOSIS:  Inadequate oral intake related to inability to eat as evidenced by NPO status.  GOAL:  Patient will meet greater than or equal to 90% of their needs  MONITOR:  Diet advancement, Labs, Weight trends, I & O's, Supplement acceptance  REASON FOR ASSESSMENT:  Consult New TPN/TNA  ASSESSMENT:  77 y/o male PMHx CVA w/ L side hemiplegia, prostate cancer and H&N cancer s/p surgery/radiation w/ PEG/trach. Presented to ED w/ diffuse abdominal pain, SOB, chills, cough x 2 days. In ED, CT abdomen showed SBO w/ transition point and CXR showed infiltrates. Admitted for management of SBO and CAP.   Pt is a poor historian, difficult to understand and seems slightly confused. Unclear what his mental baseline is.   He reports his hx of H&N cancer being much more recent, maybe 2 years ago? Though information in [place] shows it was diagnosed in 2014. Had anterior marginal mandibulectomy and radiation performed.   At baseline, pt reports a UBW of 200 lbs and is surprised to hear his current bed weight today is 209 lbs. Objectively, his office wts over the past year have been between 195-200 lbs, leading credence to his report.  Diet-wise, he says he is on "mechanical soft" diet normally, but he sounds to say he has difficulty with this occasionally. He does not follow any therapeutic diet options. He says he takes vitamins, but cannot name them. Was unable to discern his description of his baseline functional status  Acutely, he says he developed bloating and nausea last week. He says the last time he has eaten was Friday 11/22, with his last BM occurring prior to that. He has been NPO since admitted 11/26 and does not appear to have had any dextrose containing IVF. He will be at extremely high risk for acute electrolyte abnormalities.  Physical Exam-massively distended abdomen. Only muscle/fat wasting is cephalic.   Unable to dx with malnutrition with intake/edema alone  Labs: Prealbumin: 11.4, H/H:11.2/37.9, Albumin: 3.1,  BUN/Creat:25/1.34-> 27/1.15 Meds: IVF, IV abx,   Recent Labs  Lab 08/30/18 0530 08/31/18 0544 09/01/18 0312  NA 141 141 143  K 3.9 4.2 4.0  CL 108 110 110  CO2 26 23 22   BUN 18 25* 27*  CREATININE 1.42* 1.34* 1.15  CALCIUM 9.1 9.0 8.6*  GLUCOSE 130* 113* 98   NUTRITION - FOCUSED PHYSICAL EXAM:   Most Recent Value  Orbital Region  Moderate depletion  Upper Arm Region  No depletion  Thoracic and Lumbar Region  No depletion  Buccal Region  No depletion  Temple Region  Mild depletion  Clavicle Bone Region  No depletion  Clavicle and Acromion Bone Region  No depletion  Scapular Bone Region  No depletion  Dorsal Hand  No depletion  Patellar Region  No  depletion  Anterior Thigh Region  No depletion  Posterior Calf Region  No depletion  Edema (RD Assessment)  Severe  Hair  Reviewed  Eyes  Reviewed  Mouth  Reviewed  Skin  Reviewed  Nails  Reviewed     Diet Order:   Diet Order            Diet NPO time specified Except for: Ice Chips  Diet effective now             EDUCATION NEEDS:  No education needs have been identified at this time  Skin:   Other: MASD to  groin  Last BM:  Pt reports >1 week ago  Height:  Ht Readings from Last 1 Encounters:  08/28/18 6\' 1"  (1.854 m)   Weight:  Wt Readings from Last 1 Encounters:  09/01/18 94.8 kg  Care Everywhere shows wt between 195-200 over past year   Ideal Body Weight:  83.64 kg  BMI:  Body mass index is 27.57 kg/m.  Estimated Nutritional Needs:  Kcal:  2100-2300 (22-24 kcal/kg bw) Protein:  110-125g Pro (1.2-1.3g/kg bw) Fluid:  2.1-2.3 L fluid (33ml/kcal)  Burtis Junes RD, LDN, CNSC Clinical Nutrition Available Tues-Sat via Pager: [phone] 09/01/2018 3:44 PM

## 2018-09-01 NOTE — Progress Notes (Signed)
PROGRESS NOTE        PATIENT DETAILS Name: Luis Lyons Age: 77 y.o. Sex: male Date of Birth: [birth year] Admit Date: 08/28/2018 Admitting Physician [name], MD JSH:[id number], [name], MD  Brief Narrative: 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  Upon further evaluation he was found to have pneumonia and a small bowel obstruction.  See below for further details  Subjective: Passing flatus-no BM yet.  Mild upper abdominal pain continues.  Assessment/Plan: SBO: Passing flatus-but no BM yet-significant output from NG tube continues.  No radiographical improvement on abdominal x-ray this morning.  General surgery following and directing care.  May need to be started on TNA soon.  Will await further recommendations from general surgery.  Pneumonia: Afebrile-some mild wheezing today.  Continue current antimicrobial regimen.  Does not look acutely sick.   AKI: Hemodynamically mediated-resolved with supportive care.  Follow  History of CVA with chronic left-sided weakness: Remains at baseline-resume aspirin once oral intake has been resumed.    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  Antimicrobial agents: Anti-infectives (From admission, onward)   Start     Dose/Rate Route Frequency Ordered Stop   08/28/18 2230  cefTRIAXone (ROCEPHIN) 2 g in sodium chloride 0.9 % 100 mL IVPB     2  g 200 mL/hr over 30 Minutes Intravenous Every 24 hours 08/28/18 2215     08/28/18 2230  azithromycin (ZITHROMAX) 500 mg in sodium chloride 0.9 % 250 mL IVPB     500 mg 250 mL/hr over 60 Minutes Intravenous Every 24 hours 08/28/18 2215 09/01/18 2229      Procedures: None  CONSULTS:  general surgery  Time spent: 25- minutes-Greater than 50% of this time was spent in counseling, explanation of diagnosis, planning of further management, and coordination of care.  MEDICATIONS: Scheduled Meds: . enoxaparin (LOVENOX) injection  40 mg Subcutaneous Q24H  . ipratropium-albuterol  3 mL Nebulization BID  . mouth rinse  15 mL Mouth Rinse BID   Continuous Infusions: . 0.9 % NaCl with KCl 20 mEq / L 125 mL/hr at 09/01/18 0411  . azithromycin 500 mg (08/31/18 2213)  . cefTRIAXone (ROCEPHIN)  IV 2 g (08/31/18 2114)   PRN Meds:.acetaminophen **OR** acetaminophen, albuterol, hydrALAZINE, morphine injection, ondansetron **OR** ondansetron (ZOFRAN) IV   PHYSICAL EXAM: Vital signs: Vitals:   08/31/18 2205 09/01/18 0534 09/01/18 0708 09/01/18 0917  BP:  (!) 176/63 (!) 153/60   Pulse: 99 80 86   Resp: 20     Temp:  98.3 F (36.8 C)    TempSrc:  Oral    SpO2: 95% 100%  97%  Weight:      Height:       Filed Weights   08/28/18 2130 08/29/18 0126 08/31/18 0402  Weight: 90.7 kg 98.2 kg 96 kg   Body mass index is 27.92 kg/m.   General appearance:Awake, alert, not in any distress.  Eyes:no  scleral icterus. HEENT: Atraumatic and Normocephalic Neck: supple, no JVD. Resp:Good air entry bilaterally, scattered rhonchi CVS: S1 S2 regular, no murmurs.  GI: Bowel sounds present, Non tender and not distended with no gaurding, rigidity or rebound. Extremities: B/L Lower Ext shows no edema, both legs are warm to touch Musculoskeletal:No digital cyanosis Skin:No Rash, warm and dry Wounds:N/A  I have personally reviewed following labs and imaging studies  LABORATORY DATA: CBC: Recent Labs    Lab 08/28/18 2142 08/29/18 0329 08/30/18 0530 08/31/18 0544 09/01/18 0312  WBC 15.7* 12.8* 11.0* 9.3 9.9  NEUTROABS 13.7*  --   --   --   --   HGB 12.8* 13.1 12.0* 12.5* 11.2*  HCT 41.5 41.2 37.9* 40.8 37.9*  MCV 89.6 89.0 89.6 91.1 91.5  PLT 346 315 302 299 428    Basic Metabolic Panel: Recent Labs  Lab 08/28/18 2142 08/29/18 0329 08/30/18 0530 08/31/18 0544 09/01/18 0312  NA 136 137 141 141 143  K 4.2 4.1 3.9 4.2 4.0  CL 104 106 108 110 110  CO2 25 23 26 23 22   GLUCOSE 184* 155* 130* 113* 98  BUN 17 18 18  25* 27*  CREATININE 1.35* 1.28* 1.42* 1.34* 1.15  CALCIUM 9.0 8.8* 9.1 9.0 8.6*    GFR: Estimated Creatinine Clearance: 66.7 mL/min (by C-G formula based on SCr of 1.15 mg/dL).  Liver Function Tests: Recent Labs  Lab 08/28/18 2142 08/29/18 0329  AST 16 19  ALT 13 13  ALKPHOS 76 71  BILITOT 0.8 0.6  PROT 7.8 7.5  ALBUMIN 3.2* 3.1*   Recent Labs  Lab 08/28/18 2142  LIPASE 25   No results for input(s): AMMONIA in the last 168 hours.  Coagulation Profile: No results for input(s): INR, PROTIME in the last 168 hours.  Cardiac Enzymes: No results for input(s): CKTOTAL, CKMB, CKMBINDEX, TROPONINI in the last 168 hours.  BNP (last 3 results) No results for input(s): PROBNP in the last 8760 hours.  HbA1C: No results for input(s): HGBA1C in the last 72 hours.  CBG: Recent Labs  Lab 08/31/18 1044  GLUCAP 94    Lipid Profile: No results for input(s): CHOL, HDL, LDLCALC, TRIG, CHOLHDL, LDLDIRECT in the last 72 hours.  Thyroid Function Tests: No results for input(s): TSH, T4TOTAL, FREET4, T3FREE, THYROIDAB in the last 72 hours.  Anemia Panel: No results for input(s): VITAMINB12, FOLATE, FERRITIN, TIBC, IRON, RETICCTPCT in the last 72 hours.  Urine analysis:    Component Value Date/Time   COLORURINE YELLOW 08/30/2018 1245   APPEARANCEUR HAZY (A) 08/30/2018 1245   LABSPEC 1.027 08/30/2018 1245   PHURINE 5.0 08/30/2018 1245   GLUCOSEU  NEGATIVE 08/30/2018 1245   HGBUR SMALL (A) 08/30/2018 1245   BILIRUBINUR NEGATIVE 08/30/2018 1245   KETONESUR 5 (A) 08/30/2018 1245   PROTEINUR 30 (A) 08/30/2018 1245   NITRITE POSITIVE (A) 08/30/2018 1245   LEUKOCYTESUR MODERATE (A) 08/30/2018 1245    Sepsis Labs: Lactic Acid, Venous    Component Value Date/Time   LATICACIDVEN 1.86 08/28/2018 2153    MICROBIOLOGY: Recent Results (from the past 240 hour(s))  Blood Culture (routine x 2)     Status: None (Preliminary result)   Collection Time: 08/28/18  9:50 PM  Result Value Ref Range Status   Specimen Description BLOOD LEFT FOREARM  Final   Special Requests   Final    BOTTLES DRAWN AEROBIC AND ANAEROBIC Blood Culture adequate volume   Culture   Final    NO GROWTH 4 DAYS Performed  at [facility], [place] [address], [place], [place] [address]    Report Status PENDING  Incomplete  Blood Culture (routine x 2)     Status: None (Preliminary result)   Collection Time: 08/28/18 10:00 PM  Result Value Ref Range Status   Specimen Description BLOOD RIGHT HAND  Final   Special Requests   Final    BOTTLES DRAWN AEROBIC AND ANAEROBIC Blood Culture adequate volume   Culture   Final    NO GROWTH 4 DAYS Performed at [facility], [place] [address] [place], [place] [address]    Report Status PENDING  Incomplete  MRSA PCR Screening     Status: None   Collection Time: 08/29/18  2:00 AM  Result Value Ref Range Status   MRSA by PCR NEGATIVE NEGATIVE Final    Comment:        The GeneXpert MRSA Assay (FDA approved for NASAL specimens only), is one component of a comprehensive MRSA colonization surveillance program. It is not intended to diagnose MRSA infection nor to guide or monitor treatment for MRSA infections. Performed at [facility], [place] [address], [place], [place] [address]     RADIOLOGY STUDIES/RESULTS: Dg Abd 1 View  Result Date: 09/01/2018 CLINICAL DATA:  SBO, upright image requested by MD. EXAM: ABDOMEN  - 1 VIEW COMPARISON:  08/30/2018 FINDINGS: Nasogastric tube has been advanced into the proximal duodenum. The stomach appears decompressed. There are multiple gas distended small bowel loops identified in the upper abdomen. The lower abdomen is excluded. Visualized portions of colon are nondilated. There is no convincing free air. Visualized lung bases unremarkable. IMPRESSION: 1. Nasogastric tube advancement into the proximal duodenum, with persistent small bowel distention. Electronically Signed   By: Lucrezia Europe M.D.   On: 09/01/2018 07:57   Dg Abd 1 View  Result Date: 08/30/2018 CLINICAL DATA:  Small bowel obstruction EXAM: ABDOMEN - 1 VIEW COMPARISON:  August 29, 2018 FINDINGS: The NG tube terminates in the proximal stomach. Small bowel dilatation is less prominent on this study but remains. No other acute abnormalities. IMPRESSION: The small bowel dilatation is less prominent but remains consistent with persistent small-bowel obstruction. The enteric tube appears to terminate in the proximal stomach. Electronically Signed   By: Dorise Bullion III M.D   On: 08/30/2018 13:22   Dg Abd 1 View  Result Date: 08/29/2018 CLINICAL DATA:  Replacement of NG tube. EXAM: ABDOMEN - 1 VIEW COMPARISON:  One-view abdomen 08/29/2018 at 8:46 a.m. FINDINGS: Side port of the NG tube is beyond the GE junction. Mildly dilated loops of small bowel are similar the prior exam. No free air is present. IMPRESSION: 1. Side port of the NG tube is beyond the GE junction. 2. Similar appearance of multiple dilated loops of small bowel without evidence of free air. Electronically Signed   By: San Morelle M.D.   On: 08/29/2018 13:08   Dg Abd 1 View  Result Date: 08/29/2018 CLINICAL DATA:  Check nasogastric catheter placement EXAM: ABDOMEN - 1 VIEW COMPARISON:  None. FINDINGS: 30 seconds of fluoroscopy was utilized for placement. The nasogastric catheter is shown coiled within the gastric lumen. IMPRESSION: Nasogastric  catheter placed in the stomach. Electronically Signed   By: Inez Catalina M.D.   On: 08/29/2018 09:08   Ct Abdomen Pelvis W Contrast  Result Date: 08/28/2018 CLINICAL DATA:  Abdominal pain with shortness of breath EXAM: CT ABDOMEN AND PELVIS WITH CONTRAST TECHNIQUE: Multidetector CT imaging of the abdomen  and pelvis was performed using the standard protocol following bolus administration of intravenous contrast. CONTRAST:  110mL OMNIPAQUE IOHEXOL 300 MG/ML  SOLN COMPARISON:  None. FINDINGS: LOWER CHEST: There is no basilar pleural or apical pericardial effusion. HEPATOBILIARY: The hepatic contours and density are normal. There is no intra- or extrahepatic biliary dilatation. There is cholelithiasis without acute inflammation. PANCREAS: The pancreatic parenchymal contours are normal and there is no ductal dilatation. There is no peripancreatic fluid collection. SPLEEN: Normal. ADRENALS/URINARY TRACT: --Adrenal glands: Normal. --Right kidney/ureter: Mild atrophy. No hydronephrosis. --Left kidney/ureter: Vascular calcifications and interpolar nonobstructive 3 mm nephrolithiasis. No hydronephrosis. --Urinary bladder: Mild bladder wall thickening, possibly due to underdistention. STOMACH/BOWEL: --Stomach/Duodenum: There is no hiatal hernia or other gastric abnormality. The duodenal course and caliber are normal. --Small bowel: There are multiple loops of dilated small bowel in the central abdomen, predominantly anteriorly. One of these loops is directly beneath the skin surface, extending through a diastatic defect of the anterior abdominal muscles. Suspected transition point is in the anterior low midline abdomen (coronal image 24 and sagittal image 70). --Colon: No focal abnormality. --Appendix: Not visualized. No right lower quadrant inflammation or free fluid. VASCULAR/LYMPHATIC: Atherosclerotic calcification is present within the non-aneurysmal abdominal aorta, without hemodynamically significant stenosis. The  portal vein, splenic vein, superior mesenteric vein and IVC are patent. No abdominal or pelvic lymphadenopathy. REPRODUCTIVE: Normal prostate size with symmetric seminal vesicles. MUSCULOSKELETAL. Compression deformity of L2 is probably chronic. There is grade 1 L4-5 anterolisthesis secondary to facet arthrosis. OTHER: None. IMPRESSION: 1. Small bowel obstruction with suspected transition point in the low anterior midline abdomen. 2. Cholelithiasis without acute inflammation. 3. Nonobstructing left nephrolithiasis. Aortic Atherosclerosis (ICD10-I70.0). Electronically Signed   By: Ulyses Jarred M.D.   On: 08/28/2018 23:21   Dg Chest Port 1 View  Result Date: 08/28/2018 CLINICAL DATA:  77 y/o  M; abdominal pain and shortness of breath. EXAM: PORTABLE CHEST 1 VIEW COMPARISON:  None. FINDINGS: Normal cardiac silhouette. Aortic atherosclerosis with calcification. Perihilar and basilar reticular opacities. No consolidation. No pleural effusion or pneumothorax. No acute osseous abnormality is evident. IMPRESSION: Basilar reticular opacities may represent mild interstitial edema or atypical pneumonia. No consolidation. Electronically Signed   By: Kristine Garbe M.D.   On: 08/28/2018 21:55   Dg Abd Portable 1v  Result Date: 08/29/2018 CLINICAL DATA:  Small bowel obstruction, 8 hour delayed film after Gastrografin EXAM: PORTABLE ABDOMEN - 1 VIEW COMPARISON:  Abdominal radiograph from earlier today FINDINGS: Enteric tube terminates in the proximal stomach. Moderately dilated small bowel loops throughout the abdomen are not appreciably changed. Oral contrast transits to the mid small bowel. No definite oral contrast in the colon. Moderate colonic stool. No evidence of pneumatosis or pneumoperitoneum. Clear lung bases. IMPRESSION: 1. Enteric tube terminates in the stomach. 2. Stable moderately dilated small bowel loops compatible with small-bowel obstruction. Oral contrast transits to the mid small bowel,  with no definite oral contrast in the colon. Electronically Signed   By: Ilona Sorrel M.D.   On: 08/29/2018 21:38   Dg Abd Portable 1v-small Bowel Obstruction Protocol-initial, 8 Hr Delay  Result Date: 08/29/2018 CLINICAL DATA:  Follow up small bowel obstruction EXAM: PORTABLE ABDOMEN - 1 VIEW COMPARISON:  08/28/2018 FINDINGS: Nasogastric catheter is now noted within the stomach. Fecal material is noted throughout the colon. Contrast is seen within the bladder from recent CT examination. Multiple dilated small bowel loops are again identified in the mid abdomen stable from the recent exam. No acute bony abnormality is  noted. Prior L2 compression deformity is noted. IMPRESSION: Stable small bowel dilatation following nasogastric catheter placement Electronically Signed   By: Inez Catalina M.D.   On: 08/29/2018 09:09     LOS: 4 days   Oren Binet, MD  [facility]  If 7PM-7AM, please contact night-coverage  Please page via [email or web address]-Password [email or web address]-click on MD name and type text message  09/01/2018, 11:19 AM

## 2018-09-01 NOTE — Plan of Care (Signed)
Patient is experiencing increased work of breathing and short of breath due to distended abdomen. MD made aware and new orders given. RN monitoring closely.

## 2018-09-01 NOTE — Progress Notes (Addendum)
[facility] Progress Note     Subjective: CC:  Denies pain. Reports small amt flatus. No BM.  High NG output. Daughter and granddaughter at bedside.   Objective: Vital signs in last 24 hours: Temp:  [98.3 F (36.8 C)-98.5 F (36.9 C)] 98.3 F (36.8 C) (11/30 0534) Pulse Rate:  [80-102] 86 (11/30 0708) Resp:  [20] 20 (11/29 2205) BP: (153-187)/(60-81) 153/60 (11/30 0708) SpO2:  [94 %-100 %] 97 % (11/30 0917) Last BM Date: 08/26/18  Intake/Output from previous day: 11/29 0701 - 11/30 0700 In: 2556 [P.O.:1500; I.V.:1036; NG/GT:20] Out: 3700 [Urine:200; Emesis/NG output:3500] Intake/Output this shift: No intake/output data recorded.  PE: Gen:  Alert, NAD, pleasant Card:  Regular rate and rhythm Pulm:  Intermittently labored Abd: Soft, mild to moderate distention, soft reducible ventral hernia, TTP without peritonitis Skin: warm and dry, no rashes  Psych: A&Ox3   Lab Results:  Recent Labs    08/31/18 0544 09/01/18 0312  WBC 9.3 9.9  HGB 12.5* 11.2*  HCT 40.8 37.9*  PLT 299 309   BMET Recent Labs    08/31/18 0544 09/01/18 0312  NA 141 143  K 4.2 4.0  CL 110 110  CO2 23 22  GLUCOSE 113* 98  BUN 25* 27*  CREATININE 1.34* 1.15  CALCIUM 9.0 8.6*   CMP     Component Value Date/Time   NA 143 09/01/2018 0312   K 4.0 09/01/2018 0312   CL 110 09/01/2018 0312   CO2 22 09/01/2018 0312   GLUCOSE 98 09/01/2018 0312   BUN 27 (H) 09/01/2018 0312   CREATININE 1.15 09/01/2018 0312   CALCIUM 8.6 (L) 09/01/2018 0312   PROT 7.5 08/29/2018 0329   ALBUMIN 3.1 (L) 08/29/2018 0329   AST 19 08/29/2018 0329   ALT 13 08/29/2018 0329   ALKPHOS 71 08/29/2018 0329   BILITOT 0.6 08/29/2018 0329   GFRNONAA >60 09/01/2018 0312   GFRAA >60 09/01/2018 0312   Lipase     Component Value Date/Time   LIPASE 25 08/28/2018 2142   Studies/Results: Dg Abd 1 View  Result Date: 09/01/2018 CLINICAL DATA:  SBO, upright image requested by MD. Jasmine December: ABDOMEN - 1 VIEW  COMPARISON:  08/30/2018 FINDINGS: Nasogastric tube has been advanced into the proximal duodenum. The stomach appears decompressed. There are multiple gas distended small bowel loops identified in the upper abdomen. The lower abdomen is excluded. Visualized portions of colon are nondilated. There is no convincing free air. Visualized lung bases unremarkable. IMPRESSION: 1. Nasogastric tube advancement into the proximal duodenum, with persistent small bowel distention. Electronically Signed   By: Lucrezia Europe M.D.   On: 09/01/2018 07:57   Dg Abd 1 View  Result Date: 08/30/2018 CLINICAL DATA:  Small bowel obstruction EXAM: ABDOMEN - 1 VIEW COMPARISON:  August 29, 2018 FINDINGS: The NG tube terminates in the proximal stomach. Small bowel dilatation is less prominent on this study but remains. No other acute abnormalities. IMPRESSION: The small bowel dilatation is less prominent but remains consistent with persistent small-bowel obstruction. The enteric tube appears to terminate in the proximal stomach. Electronically Signed   By: Dorise Bullion III M.D   On: 08/30/2018 13:22   Anti-infectives: Anti-infectives (From admission, onward)   Start     Dose/Rate Route Frequency Ordered Stop   08/28/18 2230  cefTRIAXone (ROCEPHIN) 2 g in sodium chloride 0.9 % 100 mL IVPB     2 g 200 mL/hr over 30 Minutes Intravenous Every 24 hours 08/28/18 2215  08/28/18 2230  azithromycin (ZITHROMAX) 500 mg in sodium chloride 0.9 % 250 mL IVPB     500 mg 250 mL/hr over 60 Minutes Intravenous Every 24 hours 08/28/18 2215 09/01/18 2229     Assessment/Plan Assessment/Plan HTN PMH EtoH abuse  PMH prostate CA PMH mouth CA PMH CVA with left sided weakness ARF - improved, Creatinine normalized Likely aspiration PNA  pSBO - CT 11/26 w/ transition zone in lower midline - NG tube placed in fluorox 2 - SB protocol,persistently dilated loops of small bowel, minimal bowel function. I am concerned that this patient  is not going to improve without operative intervention - NG output 3,500 mL/24h - start TNA today - OOBto chair  FEN - NPO, IVF, NG tube to LIWS, check pre-albumin and start TNA today ID - Azithromycin 11/26 >>, Rocephin 11/26 >> ; afebrile this AM, WBC 9.9 VTE - SCD's  Plan: DG abd in AM, TNA    LOS: 4 days    Obie Dredge, [facility] Pager: [phone]

## 2018-09-01 NOTE — Progress Notes (Addendum)
PHARMACY - ADULT TOTAL PARENTERAL NUTRITION CONSULT NOTE   Pharmacy Consult:  TPN Indication:  SBO  Patient Measurements: Height: 6\' 1"  (185.4 cm) Weight: 211 lb 10.3 oz (96 kg) IBW/kg (Calculated) : 79.9 TPN AdjBW (KG): 90.7 Body mass index is 27.92 kg/m.  Assessment:  37 YOM presented on 08/28/18 with abdominal pain and SOB.  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  He also has a history of ruptured appendicitis requiring ex-lap and his hernia is enlarging with chronic cough.  CT showed SBO with gallbladder stones.  Patient has been NPO for 5 days and MD concerned patient will not improve without surgical intervention; therefore, Pharmacy consulted to initiate TPN.  GI: LBM 11/25, +flatus, NG O/P 3523mL.  PRN Zofran. Endo: no hx DM - AM glucose WNL Insulin requirements in the past 24 hours: N/A Lytes: all WNL Renal: SCr down 1.15, BUN 27 - NS 20 K at 125/hr Pulm: stable on RA - Duonebs Cards: HTN - BP elevated, tachy improving - PRN hydralazine Hepatobil: LFTs WNL Neuro: CVA ID: [id number] (12/26 >> ) for PNA - afebrile, WBC WNL, BCx NGTD TPN Access: pending TPN start date: 09/02/18  Nutritional Goals (RD rec pending): 4481-8563 kCal, 140-150gm protein per day  Current Nutrition:  NPO   Plan:  Start TPN 09/02/18 as patient does not have central access and it is after hour TPN labs in AM   Azriella Mattia D. Mina Marble, PharmD, BCPS, [place] 09/01/2018, 12:41 PM

## 2018-09-01 NOTE — Progress Notes (Signed)
Patient's BP 171/87. PRN 5mg  Hydralazine IV given at 1847. Notified Blount with TRH at 2126. Awaiting MD orders. RN to continue to monitor

## 2018-09-01 NOTE — Progress Notes (Signed)
Peripherally Inserted Central Catheter/Midline Placement  The IV Nurse has discussed with the patient and/or persons authorized to consent for the patient, the purpose of this procedure and the potential benefits and risks involved with this procedure.  The benefits include less needle sticks, lab draws from the catheter, and the patient may be discharged home with the catheter. Risks include, but not limited to, infection, bleeding, blood clot (thrombus formation), and puncture of an artery; nerve damage and irregular heartbeat and possibility to perform a PICC exchange if needed/ordered by physician.  Alternatives to this procedure were also discussed.  Bard Power PICC patient education guide, fact sheet on infection prevention and patient information card has been provided to patient /or left at bedside.    PICC/Midline Placement Documentation     Consent obtained via telephone with daughter .   Holley Bouche Renee 09/01/2018, 3:11 PM

## 2018-09-02 ENCOUNTER — Inpatient Hospital Stay (HOSPITAL_COMMUNITY): Payer: Medicare Other

## 2018-09-02 ENCOUNTER — Encounter (HOSPITAL_COMMUNITY): Payer: Self-pay

## 2018-09-02 LAB — DIFFERENTIAL
Abs Immature Granulocytes: 0.1 10*3/uL — ABNORMAL HIGH (ref 0.00–0.07)
Basophils Absolute: 0 10*3/uL (ref 0.0–0.1)
Basophils Relative: 0 %
Eosinophils Absolute: 0.2 10*3/uL (ref 0.0–0.5)
Eosinophils Relative: 2 %
Immature Granulocytes: 1 %
Lymphocytes Relative: 13 %
Lymphs Abs: 1.5 10*3/uL (ref 0.7–4.0)
Monocytes Absolute: 0.9 10*3/uL (ref 0.1–1.0)
Monocytes Relative: 8 %
NEUTROS ABS: 8.7 10*3/uL — AB (ref 1.7–7.7)
Neutrophils Relative %: 76 %

## 2018-09-02 LAB — COMPREHENSIVE METABOLIC PANEL
ALT: 12 U/L (ref 0–44)
AST: 15 U/L (ref 15–41)
Albumin: 2.7 g/dL — ABNORMAL LOW (ref 3.5–5.0)
Alkaline Phosphatase: 49 U/L (ref 38–126)
Anion gap: 12 (ref 5–15)
BUN: 25 mg/dL — ABNORMAL HIGH (ref 8–23)
CO2: 23 mmol/L (ref 22–32)
Calcium: 8.6 mg/dL — ABNORMAL LOW (ref 8.9–10.3)
Chloride: 108 mmol/L (ref 98–111)
Creatinine, Ser: 1.21 mg/dL (ref 0.61–1.24)
GFR calc Af Amer: >60 mL/min (ref >60)
GFR calc non Af Amer: 58 mL/min — ABNORMAL LOW (ref >60)
Glucose, Bld: 92 mg/dL (ref 70–99)
Potassium: 3.6 mmol/L (ref 3.5–5.1)
Sodium: 143 mmol/L (ref 135–145)
TOTAL PROTEIN: 7.1 g/dL (ref 6.5–8.1)
Total Bilirubin: 0.7 mg/dL (ref 0.3–1.2)

## 2018-09-02 LAB — CULTURE, BLOOD (ROUTINE X 2)
CULTURE: NO GROWTH
Culture: NO GROWTH
Special Requests: ADEQUATE
Special Requests: ADEQUATE

## 2018-09-02 LAB — CBC
HEMATOCRIT: 39 % (ref 39.0–52.0)
Hemoglobin: 11.7 g/dL — ABNORMAL LOW (ref 13.0–17.0)
MCH: 27.3 pg (ref 26.0–34.0)
MCHC: 30 g/dL (ref 30.0–36.0)
MCV: 91.1 fL (ref 80.0–100.0)
Platelets: 338 10*3/uL (ref 150–400)
RBC: 4.28 MIL/uL (ref 4.22–5.81)
RDW: 14.2 % (ref 11.5–15.5)
WBC: 11.5 10*3/uL — ABNORMAL HIGH (ref 4.0–10.5)
nRBC: 0 % (ref 0.0–0.2)

## 2018-09-02 LAB — GLUCOSE, CAPILLARY
Glucose-Capillary: 102 mg/dL — ABNORMAL HIGH (ref 70–99)
Glucose-Capillary: 87 mg/dL (ref 70–99)
Glucose-Capillary: 72 mg/dL (ref 70–99)

## 2018-09-02 LAB — PHOSPHORUS: Phosphorus: 2.8 mg/dL (ref 2.5–4.6)

## 2018-09-02 LAB — TRIGLYCERIDES: Triglycerides: 146 mg/dL (ref <150)

## 2018-09-02 LAB — MAGNESIUM: Magnesium: 2.3 mg/dL (ref 1.7–2.4)

## 2018-09-02 MED ORDER — LIDOCAINE VISCOUS HCL 2 % MT SOLN
OROMUCOSAL | Status: AC
  Filled 2018-09-02: qty 15

## 2018-09-02 MED ORDER — TRAVASOL 10 % IV SOLN
INTRAVENOUS | Status: AC
  Administered 2018-09-02: 18:00:00 via INTRAVENOUS
  Filled 2018-09-02: qty 424.8

## 2018-09-02 MED ORDER — DIATRIZOATE MEGLUMINE & SODIUM 66-10 % PO SOLN
ORAL | Status: AC
  Filled 2018-09-02: qty 30

## 2018-09-02 MED ORDER — INSULIN ASPART 100 UNIT/ML ~~LOC~~ SOLN
0.0000 [IU] | Freq: Four times a day (QID) | SUBCUTANEOUS | Status: DC
  Administered 2018-09-04 – 2018-09-10 (×13): 1 [IU] via SUBCUTANEOUS

## 2018-09-02 MED ORDER — POTASSIUM PHOSPHATES 15 MMOLE/5ML IV SOLN
15.0000 mmol | Freq: Once | INTRAVENOUS | Status: AC
  Administered 2018-09-02: 15 mmol via INTRAVENOUS
  Filled 2018-09-02: qty 5

## 2018-09-02 NOTE — Progress Notes (Signed)
PHARMACY - ADULT TOTAL PARENTERAL NUTRITION CONSULT NOTE   Pharmacy Consult:  TPN Indication:  SBO  Patient Measurements: Height: 6\' 1"  (185.4 cm) Weight: 205 lb 11 oz (93.3 kg)(bedweight) IBW/kg (Calculated) : 79.9 TPN AdjBW (KG): 90.7 Body mass index is 27.14 kg/m.  Usual body weight = 91 kg  Assessment:  77 YOM presented on 08/28/18 with abdominal pain and SOB.  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  He also has a history of ruptured appendicitis requiring ex-lap and his hernia is enlarging with chronic cough.  CT showed SBO with gallbladder stones.  Patient has been NPO for 5 days with limited PO intake since 08/24/18 per documentation.  MD concerned patient will not improve without surgical intervention; therefore, Pharmacy consulted to initiate TPN.  Patient is at risk for refeeding.  GI: baseline prealbumin low at 11.4, LBM 11/25, +flatus, NG O/P down to 1242mL.  PRN Zofran. Endo: no hx DM - AM glucose WNL Insulin requirements in the past 24 hours: N/A Lytes: all WNL (K trended down, likely d/t NG O/P) Renal: SCr up 1.21, BUN 25 - UOP 1.4 ml/k/ghr with Lasix, NS 20K at 125/hr, net -1.8L  Pulm: RA >> 2L [place] - Duonebs Cards: HTN - BP elevated, tachy improving - PRN hydralazine/metoprolol, Lasix 40mg  IV x1 on 11/30 Hepatobil: LFTs / tbili / TG WNL Neuro: CVA ID: [id number] (12/26 >> ), off azith for PNA - afebrile, WBC WNL, BCx NGTD TPN Access: PICC placed 09/01/18  TPN start date: 09/02/18  Nutritional Goals (RD rec on 09/01/18): 2100-2300 kCal, 110-125gm protein per day  Current Nutrition:  NPO  Plan:  Initiate TPN at 30 ml/hr (goal rate 85 ml/hr) TPN will provide 42g AA, 108g CHO and 23g ILE for a total of 768 kCal, meeting ~35% of patient's needs Electrolytes in TPN: standard except increased K/Phos d/t low TPN rate, Cl:Ac 1:1 Daily multivitamin and trace elements in TPN.  Add thiamine per RD's request.  D/C thiamine once stable at  goal rate. Start sensitive SSI Q6H Continue NS 20K at 125 ml/hr per MD.  F/U NG output/volume status with TPN initiation. KPhos 15 mmol IV x 1 d/t risk of refeeding F/U AM labs   Luis Lyons, PharmD, BCPS, [place] 09/02/2018, 7:15 AM

## 2018-09-02 NOTE — Progress Notes (Signed)
PROGRESS NOTE        PATIENT DETAILS Name: Luis Lyons Age: 77 y.o. Sex: male Date of Birth: 1941/07/23 Admit Date: 08/28/2018 Admitting Physician [name], MD GEX:[id number], [name], MD  Brief Narrative: 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  Upon further evaluation he was found to have pneumonia and a small bowel obstruction.  See below for further details  Subjective: Claims to be passing flatus-no BM yet.  Unfortunately NG tube came out last night-patient vomited x1.  Some wheezing this morning.  Assessment/Plan: SBO: Continues to have persistent SBO-no BM but passing flatus.  Unfortunately NG tube was inadvertently came out last night.  Has been started on TNA-plans are to replace NG tube under fluoroscopic guidance.  General surgery contemplating laparotomy if patient continues to have bowel obstruction.    Pneumonia: Suspect aspiration-more wheezing today but patient appears stable-high suspicion for aspiration pneumonia.  Awaiting repeat chest x-ray today.  Follow-remains on IV Rocephin.  .   AKI: Hemodynamically mediated-resolved with supportive care.  Follow  History of CVA with chronic left-sided weakness: Remains at baseline-resume aspirin once oral intake has been resumed.    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  Antimicrobial agents: Anti-infectives (From  admission, onward)   Start     Dose/Rate Route Frequency Ordered Stop   08/28/18 2230  cefTRIAXone (ROCEPHIN) 2 g in sodium chloride 0.9 % 100 mL IVPB     2 g 200 mL/hr over 30 Minutes Intravenous Every 24 hours 08/28/18 2215     08/28/18 2230  azithromycin (ZITHROMAX) 500 mg in sodium chloride 0.9 % 250 mL IVPB     500 mg 250 mL/hr over 60 Minutes Intravenous Every 24 hours 08/28/18 2215 09/01/18 2229      Procedures: None  CONSULTS:  general surgery  Time spent: 25- minutes-Greater than 50% of this time was spent in counseling, explanation of diagnosis, planning of further management, and coordination of care.  MEDICATIONS: Scheduled Meds: . diatrizoate meglumine-sodium      . enoxaparin (LOVENOX) injection  40 mg Subcutaneous Q24H  . insulin aspart  0-9 Units Subcutaneous Q6H  . ipratropium-albuterol  3 mL Nebulization BID  . lidocaine      . mouth rinse  15 mL Mouth Rinse BID  . polyvinyl alcohol  1 drop Both Eyes BID   Continuous Infusions: . 0.9 % NaCl with KCl 20 mEq / L 125 mL/hr at 09/02/18 1100  . cefTRIAXone (ROCEPHIN)  IV Stopped (09/01/18 2211)  . potassium PHOSPHATE IVPB (in mmol) 43 mL/hr at 09/02/18 1100  . TPN ADULT (ION)     PRN Meds:.acetaminophen **OR** acetaminophen, albuterol, hydrALAZINE, metoprolol tartrate, morphine injection, ondansetron **OR** ondansetron (ZOFRAN) IV, sodium chloride flush   PHYSICAL EXAM: Vital signs: Vitals:   09/02/18 0131 09/02/18 0500 09/02/18 0507 09/02/18 0835  BP: (!) 164/94  (!) 174/72   Pulse:   91   Resp:  20   Temp:   98 F (36.7 C)   TempSrc:   Oral   SpO2:   95% 95%  Weight:  93.3 kg    Height:       Filed Weights   08/31/18 0402 09/01/18 1538 09/02/18 0500  Weight: 96 kg 94.8 kg 93.3 kg   Body mass index is 27.14 kg/m.   General appearance:Awake, alert, not in any distress.  Eyes:no scleral icterus. HEENT: Atraumatic and Normocephalic Neck: supple, no JVD. Resp:Good air entry bilaterally,  scattered rhonchi CVS: S1 S2 regular, no murmurs.  GI: Bowel sounds sluggish, slightly distended but nontender. Extremities: B/L Lower Ext shows no edema, both legs are warm to touch Neurology:  Non focal Psychiatric: Normal judgment and insight. Normal mood. Musculoskeletal:No digital cyanosis Skin:No Rash, warm and dry Wounds:N/A  I have personally reviewed following labs and imaging studies  LABORATORY DATA: CBC: Recent Labs  Lab 08/28/18 2142 08/29/18 0329 08/30/18 0530 08/31/18 0544 09/01/18 0312 09/02/18 0435  WBC 15.7* 12.8* 11.0* 9.3 9.9 11.5*  NEUTROABS 13.7*  --   --   --   --  8.7*  HGB 12.8* 13.1 12.0* 12.5* 11.2* 11.7*  HCT 41.5 41.2 37.9* 40.8 37.9* 39.0  MCV 89.6 89.0 89.6 91.1 91.5 91.1  PLT 346 315 302 299 309 664    Basic Metabolic Panel: Recent Labs  Lab 08/29/18 0329 08/30/18 0530 08/31/18 0544 09/01/18 0312 09/02/18 0435  NA 137 141 141 143 143  K 4.1 3.9 4.2 4.0 3.6  CL 106 108 110 110 108  CO2 23 26 23 22 23   GLUCOSE 155* 130* 113* 98 92  BUN 18 18 25* 27* 25*  CREATININE 1.28* 1.42* 1.34* 1.15 1.21  CALCIUM 8.8* 9.1 9.0 8.6* 8.6*  MG  --   --   --   --  2.3  PHOS  --   --   --   --  2.8    GFR: Estimated Creatinine Clearance: 58.7 mL/min (by C-G formula based on SCr of 1.21 mg/dL).  Liver Function Tests: Recent Labs  Lab 08/28/18 2142 08/29/18 0329 09/02/18 0435  AST 16 19 15   ALT 13 13 12   ALKPHOS 76 71 49  BILITOT 0.8 0.6 0.7  PROT 7.8 7.5 7.1  ALBUMIN 3.2* 3.1* 2.7*   Recent Labs  Lab 08/28/18 2142  LIPASE 25   No results for input(s): AMMONIA in the last 168 hours.  Coagulation Profile: No results for input(s): INR, PROTIME in the last 168 hours.  Cardiac Enzymes: No results for input(s): CKTOTAL, CKMB, CKMBINDEX, TROPONINI in the last 168 hours.  BNP (last 3 results) No results for input(s): PROBNP in the last 8760 hours.  HbA1C: No results for input(s): HGBA1C in the last 72 hours.  CBG: Recent Labs    Lab 08/31/18 1044 09/02/18 0831 09/02/18 1238  GLUCAP 94 102* 87    Lipid Profile: Recent Labs    09/02/18 0435  TRIG 146    Thyroid Function Tests: No results for input(s): TSH, T4TOTAL, FREET4, T3FREE, THYROIDAB in the last 72 hours.  Anemia Panel: No results for input(s): VITAMINB12, FOLATE, FERRITIN, TIBC, IRON, RETICCTPCT in the last 72 hours.  Urine analysis:    Component Value Date/Time   COLORURINE YELLOW 08/30/2018 1245   APPEARANCEUR HAZY (A) 08/30/2018 1245   LABSPEC 1.027 08/30/2018 1245   PHURINE 5.0 08/30/2018 1245   GLUCOSEU NEGATIVE 08/30/2018 1245   HGBUR SMALL (A) 08/30/2018 1245   BILIRUBINUR NEGATIVE 08/30/2018 1245  KETONESUR 5 (A) 08/30/2018 1245   PROTEINUR 30 (A) 08/30/2018 1245   NITRITE POSITIVE (A) 08/30/2018 1245   LEUKOCYTESUR MODERATE (A) 08/30/2018 1245    Sepsis Labs: Lactic Acid, Venous    Component Value Date/Time   LATICACIDVEN 1.86 08/28/2018 2153    MICROBIOLOGY: Recent Results (from the past 240 hour(s))  Blood Culture (routine x 2)     Status: None   Collection Time: 08/28/18  9:50 PM  Result Value Ref Range Status   Specimen Description BLOOD LEFT FOREARM  Final   Special Requests   Final    BOTTLES DRAWN AEROBIC AND ANAEROBIC Blood Culture adequate volume   Culture   Final    NO GROWTH 5 DAYS Performed at [facility], [place] [address] [place], [place] [address]    Report Status 09/02/2018 FINAL  Final  Blood Culture (routine x 2)     Status: None   Collection Time: 08/28/18 10:00 PM  Result Value Ref Range Status   Specimen Description BLOOD RIGHT HAND  Final   Special Requests   Final    BOTTLES DRAWN AEROBIC AND ANAEROBIC Blood Culture adequate volume   Culture   Final    NO GROWTH 5 DAYS Performed at [facility], [place] [address], [place], [place] [address]    Report Status 09/02/2018 FINAL  Final  MRSA PCR Screening     Status: None   Collection Time: 08/29/18  2:00 AM  Result Value Ref  Range Status   MRSA by PCR NEGATIVE NEGATIVE Final    Comment:        The GeneXpert MRSA Assay (FDA approved for NASAL specimens only), is one component of a comprehensive MRSA colonization surveillance program. It is not intended to diagnose MRSA infection nor to guide or monitor treatment for MRSA infections. Performed at [facility], [place] [address], [place], [place] [address]     RADIOLOGY STUDIES/RESULTS: Dg Abd 1 View  Result Date: 09/01/2018 CLINICAL DATA:  SBO, upright image requested by MD. EXAM: ABDOMEN - 1 VIEW COMPARISON:  08/30/2018 FINDINGS: Nasogastric tube has been advanced into the proximal duodenum. The stomach appears decompressed. There are multiple gas distended small bowel loops identified in the upper abdomen. The lower abdomen is excluded. Visualized portions of colon are nondilated. There is no convincing free air. Visualized lung bases unremarkable. IMPRESSION: 1. Nasogastric tube advancement into the proximal duodenum, with persistent small bowel distention. Electronically Signed   By: Lucrezia Europe M.D.   On: 09/01/2018 07:57   Dg Abd 1 View  Result Date: 08/30/2018 CLINICAL DATA:  Small bowel obstruction EXAM: ABDOMEN - 1 VIEW COMPARISON:  August 29, 2018 FINDINGS: The NG tube terminates in the proximal stomach. Small bowel dilatation is less prominent on this study but remains. No other acute abnormalities. IMPRESSION: The small bowel dilatation is less prominent but remains consistent with persistent small-bowel obstruction. The enteric tube appears to terminate in the proximal stomach. Electronically Signed   By: Dorise Bullion III M.D   On: 08/30/2018 13:22   Dg Abd 1 View  Result Date: 08/29/2018 CLINICAL DATA:  Replacement of NG tube. EXAM: ABDOMEN - 1 VIEW COMPARISON:  One-view abdomen 08/29/2018 at 8:46 a.m. FINDINGS: Side port of the NG tube is beyond the GE junction. Mildly dilated loops of small bowel are similar the prior exam. No free air  is present. IMPRESSION: 1. Side port of the NG tube is beyond the GE junction. 2. Similar appearance  of multiple dilated loops of small bowel without evidence of free air. Electronically Signed   By: San Morelle M.D.   On: 08/29/2018 13:08   Dg Abd 1 View  Result Date: 08/29/2018 CLINICAL DATA:  Check nasogastric catheter placement EXAM: ABDOMEN - 1 VIEW COMPARISON:  None. FINDINGS: 30 seconds of fluoroscopy was utilized for placement. The nasogastric catheter is shown coiled within the gastric lumen. IMPRESSION: Nasogastric catheter placed in the stomach. Electronically Signed   By: Inez Catalina M.D.   On: 08/29/2018 09:08   Ct Abdomen Pelvis W Contrast  Result Date: 08/28/2018 CLINICAL DATA:  Abdominal pain with shortness of breath EXAM: CT ABDOMEN AND PELVIS WITH CONTRAST TECHNIQUE: Multidetector CT imaging of the abdomen and pelvis was performed using the standard protocol following bolus administration of intravenous contrast. CONTRAST:  151mL OMNIPAQUE IOHEXOL 300 MG/ML  SOLN COMPARISON:  None. FINDINGS: LOWER CHEST: There is no basilar pleural or apical pericardial effusion. HEPATOBILIARY: The hepatic contours and density are normal. There is no intra- or extrahepatic biliary dilatation. There is cholelithiasis without acute inflammation. PANCREAS: The pancreatic parenchymal contours are normal and there is no ductal dilatation. There is no peripancreatic fluid collection. SPLEEN: Normal. ADRENALS/URINARY TRACT: --Adrenal glands: Normal. --Right kidney/ureter: Mild atrophy. No hydronephrosis. --Left kidney/ureter: Vascular calcifications and interpolar nonobstructive 3 mm nephrolithiasis. No hydronephrosis. --Urinary bladder: Mild bladder wall thickening, possibly due to underdistention. STOMACH/BOWEL: --Stomach/Duodenum: There is no hiatal hernia or other gastric abnormality. The duodenal course and caliber are normal. --Small bowel: There are multiple loops of dilated small bowel in the  central abdomen, predominantly anteriorly. One of these loops is directly beneath the skin surface, extending through a diastatic defect of the anterior abdominal muscles. Suspected transition point is in the anterior low midline abdomen (coronal image 24 and sagittal image 70). --Colon: No focal abnormality. --Appendix: Not visualized. No right lower quadrant inflammation or free fluid. VASCULAR/LYMPHATIC: Atherosclerotic calcification is present within the non-aneurysmal abdominal aorta, without hemodynamically significant stenosis. The portal vein, splenic vein, superior mesenteric vein and IVC are patent. No abdominal or pelvic lymphadenopathy. REPRODUCTIVE: Normal prostate size with symmetric seminal vesicles. MUSCULOSKELETAL. Compression deformity of L2 is probably chronic. There is grade 1 L4-5 anterolisthesis secondary to facet arthrosis. OTHER: None. IMPRESSION: 1. Small bowel obstruction with suspected transition point in the low anterior midline abdomen. 2. Cholelithiasis without acute inflammation. 3. Nonobstructing left nephrolithiasis. Aortic Atherosclerosis (ICD10-I70.0). Electronically Signed   By: Ulyses Jarred M.D.   On: 08/28/2018 23:21   Dg Chest Port 1 View  Result Date: 08/28/2018 CLINICAL DATA:  77 y/o  M; abdominal pain and shortness of breath. EXAM: PORTABLE CHEST 1 VIEW COMPARISON:  None. FINDINGS: Normal cardiac silhouette. Aortic atherosclerosis with calcification. Perihilar and basilar reticular opacities. No consolidation. No pleural effusion or pneumothorax. No acute osseous abnormality is evident. IMPRESSION: Basilar reticular opacities may represent mild interstitial edema or atypical pneumonia. No consolidation. Electronically Signed   By: Kristine Garbe M.D.   On: 08/28/2018 21:55   Dg Abd Portable 1v  Result Date: 08/29/2018 CLINICAL DATA:  Small bowel obstruction, 8 hour delayed film after Gastrografin EXAM: PORTABLE ABDOMEN - 1 VIEW COMPARISON:  Abdominal  radiograph from earlier today FINDINGS: Enteric tube terminates in the proximal stomach. Moderately dilated small bowel loops throughout the abdomen are not appreciably changed. Oral contrast transits to the mid small bowel. No definite oral contrast in the colon. Moderate colonic stool. No evidence of pneumatosis or pneumoperitoneum. Clear lung bases. IMPRESSION: 1. Enteric tube terminates  in the stomach. 2. Stable moderately dilated small bowel loops compatible with small-bowel obstruction. Oral contrast transits to the mid small bowel, with no definite oral contrast in the colon. Electronically Signed   By: Ilona Sorrel M.D.   On: 08/29/2018 21:38   Dg Abd Portable 1v-small Bowel Obstruction Protocol-initial, 8 Hr Delay  Result Date: 08/29/2018 CLINICAL DATA:  Follow up small bowel obstruction EXAM: PORTABLE ABDOMEN - 1 VIEW COMPARISON:  08/28/2018 FINDINGS: Nasogastric catheter is now noted within the stomach. Fecal material is noted throughout the colon. Contrast is seen within the bladder from recent CT examination. Multiple dilated small bowel loops are again identified in the mid abdomen stable from the recent exam. No acute bony abnormality is noted. Prior L2 compression deformity is noted. IMPRESSION: Stable small bowel dilatation following nasogastric catheter placement Electronically Signed   By: Inez Catalina M.D.   On: 08/29/2018 09:09   Dg Abd Portable 2v  Result Date: 09/02/2018 CLINICAL DATA:  Initial evaluation for possible foreign body, missing piece of NG tube. EXAM: PORTABLE ABDOMEN - 2 VIEW COMPARISON:  Prior radiograph from 09/01/2018. FINDINGS: Previously seen enteric tube has been removed. No visible tube fragment or other radiopaque foreign body identified. Persistent small bowel dilatation noted within the upper abdomen, suggestive of ongoing small bowel obstruction. This appears improved from previous. Partially visualized lung bases are grossly clear. IMPRESSION: 1. No visible  tube fragment or other radiopaque foreign body identified. 2. Persistent but improved small bowel obstruction. Electronically Signed   By: Jeannine Boga M.D.   On: 09/02/2018 05:52   Dg Addison Bailey G Tube Plc W/fl W/rad  Result Date: 09/02/2018 CLINICAL DATA:  NG tube placement. EXAM: NASO G TUBE PLACEMENT WITH FL AND WITH RAD CONTRAST:  Water-soluble contrast FLUOROSCOPY TIME:  Fluoroscopy Time:  5 minutes 48 seconds Radiation Exposure Index (if provided by the fluoroscopic device): Number of Acquired Spot Images: 2 COMPARISON:  09/02/2018 FINDINGS: Under direct fluoroscopic guidance a weighted nasogastric tube was placed into the right nares. The tube was then guided into the esophagus, past the GE junction and into the gastric lumen. The tube was then manipulated into the third portion of the duodenum. A small volume of water-soluble contrast material was injected into the tube confirming placement of the tube at the level of the duodenal jejunal junction. IMPRESSION: 1. Successful fluoroscopic guided weighted nasogastric tube placement. The tip is at the level of the duodenal jejunal junction and is ready for use. Electronically Signed   By: Kerby Moors M.D.   On: 09/02/2018 11:43   [place] Ekg Site Rite  Result Date: 09/01/2018 If Site Rite image not attached, placement could not be confirmed due to current cardiac rhythm.    LOS: 5 days   Oren Binet, MD  [facility]  If 7PM-7AM, please contact night-coverage  Please page via [email or web address] [email or web address]-click on MD name and type text message  09/02/2018, 1:29 PM

## 2018-09-02 NOTE — Progress Notes (Signed)
RN went to make rounds on patient and found NG laying on patient's chest. NG tube tip is straight and not blunted. Concern for missing tip of NG.  [place]ount with [facility] notified and STAT ABD 2V Xray ordered. RN to continue to monitor closely.

## 2018-09-02 NOTE — Progress Notes (Signed)
Patient returned to floor at 1200 from radiology with a coretrack instead of an NG tube. Paged attending physician and attempted to page ordering provider twice. This RN was able to speak with Radiologist Dr. Clovis Riley. Per Radiologist send NG tube down with patient for placement. Was able to speak with Dr. Dema Severin from surgery and explained what had happed. Per MD patient still needs NG tube. Explained to daughter and patient what was going on.

## 2018-09-02 NOTE — Progress Notes (Signed)
[facility] Progress Note     Subjective: CC:  NG tube came out again. Denies BM. Increasing distention and one episodes emesis since NG tube came out.   Objective: Vital signs in last 24 hours: Temp:  [98 F (36.7 C)-98.6 F (37 C)] 98 F (36.7 C) (12/01 0507) Pulse Rate:  [90-95] 91 (12/01 0507) Resp:  [16-20] 20 (12/01 0507) BP: (164-188)/(71-94) 174/72 (12/01 0507) SpO2:  [95 %-97 %] 95 % (12/01 0835) Weight:  [93.3 kg-94.8 kg] 93.3 kg (12/01 0500) Last BM Date: 08/26/18  Intake/Output from previous day: 11/30 0701 - 12/01 0700 In: 4368.8 [I.V.:4168.8; IV Piggyback:200] Out: 3825 [Urine:2625; Emesis/NG output:1200] Intake/Output this shift: No intake/output data recorded.  PE: Gen:  Alert, NAD, pleasant Card:  Regular rate and rhythm Pulm:  slightly labored, mild wheezes bilaterally  Abd: Soft,  moderate distention, soft reducible ventral hernia, TTP without peritonitis  NG with 1500 cc/24h documented, NG no longer in place Skin: warm and dry, no rashes; RUE PICC in place Psych: A&Ox3  Lab Results:  Recent Labs    09/01/18 0312 09/02/18 0435  WBC 9.9 11.5*  HGB 11.2* 11.7*  HCT 37.9* 39.0  PLT 309 338   BMET Recent Labs    09/01/18 0312 09/02/18 0435  NA 143 143  K 4.0 3.6  CL 110 108  CO2 22 23  GLUCOSE 98 92  BUN 27* 25*  CREATININE 1.15 1.21  CALCIUM 8.6* 8.6*   PT/INR No results for input(s): LABPROT, INR in the last 72 hours. CMP     Component Value Date/Time   NA 143 09/02/2018 0435   K 3.6 09/02/2018 0435   CL 108 09/02/2018 0435   CO2 23 09/02/2018 0435   GLUCOSE 92 09/02/2018 0435   BUN 25 (H) 09/02/2018 0435   CREATININE 1.21 09/02/2018 0435   CALCIUM 8.6 (L) 09/02/2018 0435   PROT 7.1 09/02/2018 0435   ALBUMIN 2.7 (L) 09/02/2018 0435   AST 15 09/02/2018 0435   ALT 12 09/02/2018 0435   ALKPHOS 49 09/02/2018 0435   BILITOT 0.7 09/02/2018 0435   GFRNONAA 58 (L) 09/02/2018 0435   GFRAA >60 09/02/2018 0435    Lipase     Component Value Date/Time   LIPASE 25 08/28/2018 2142       Studies/Results: Dg Abd 1 View  Result Date: 09/01/2018 CLINICAL DATA:  SBO, upright image requested by MD. Jasmine December: ABDOMEN - 1 VIEW COMPARISON:  08/30/2018 FINDINGS: Nasogastric tube has been advanced into the proximal duodenum. The stomach appears decompressed. There are multiple gas distended small bowel loops identified in the upper abdomen. The lower abdomen is excluded. Visualized portions of colon are nondilated. There is no convincing free air. Visualized lung bases unremarkable. IMPRESSION: 1. Nasogastric tube advancement into the proximal duodenum, with persistent small bowel distention. Electronically Signed   By: Lucrezia Europe M.D.   On: 09/01/2018 07:57   Dg Abd Portable 2v  Result Date: 09/02/2018 CLINICAL DATA:  Initial evaluation for possible foreign body, missing piece of NG tube. EXAM: PORTABLE ABDOMEN - 2 VIEW COMPARISON:  Prior radiograph from 09/01/2018. FINDINGS: Previously seen enteric tube has been removed. No visible tube fragment or other radiopaque foreign body identified. Persistent small bowel dilatation noted within the upper abdomen, suggestive of ongoing small bowel obstruction. This appears improved from previous. Partially visualized lung bases are grossly clear. IMPRESSION: 1. No visible tube fragment or other radiopaque foreign body identified. 2. Persistent but improved small bowel obstruction. Electronically Signed  By: Jeannine Boga M.D.   On: 09/02/2018 05:52   [place] Ekg Site Rite  Result Date: 09/01/2018 If Site Rite image not attached, placement could not be confirmed due to current cardiac rhythm.   Anti-infectives: Anti-infectives (From admission, onward)   Start     Dose/Rate Route Frequency Ordered Stop   08/28/18 2230  cefTRIAXone (ROCEPHIN) 2 g in sodium chloride 0.9 % 100 mL IVPB     2 g 200 mL/hr over 30 Minutes Intravenous Every 24 hours 08/28/18 2215      08/28/18 2230  azithromycin (ZITHROMAX) 500 mg in sodium chloride 0.9 % 250 mL IVPB     500 mg 250 mL/hr over 60 Minutes Intravenous Every 24 hours 08/28/18 2215 09/01/18 2229     Assessment/Plan HTN PMH EtoH abuse  PMH prostate CA PMH mouth CA PMH CVA with left sided weakness ARF - improved, Creatinine normalized Likely aspiration PNA - possibly aspirated again overnight, CXR pending   pSBO - CT 11/26 w/ transition zone in lower midline - NG tube placed in fluorox 2 - SB protocol,persistently dilated loops of small bowel, minimal bowel function. I am concerned that this patient is not going to improve without operative intervention - NG output 1,500 mL/24h, NG pulled out again and will need to be replaced  - started TNA 11/31 - OOBto chair  FEN - NPO, NG tube to LIWS, TNA, prealbumin 11 ID - Azithromycin 11/26 >>, Rocephin 11/26 >> ; afebrile this AM, WBC 9.9 VTE - SCD's, Lovenox  Plan:replace NG in fluoro, follow patient clinically. Suspect he will need an operation this week.    LOS: 5 days    Obie Dredge, [facility] Surgery Pager: [phone]

## 2018-09-03 ENCOUNTER — Inpatient Hospital Stay (HOSPITAL_COMMUNITY): Payer: Medicare Other

## 2018-09-03 LAB — DIFFERENTIAL
Abs Immature Granulocytes: 0.11 10*3/uL — ABNORMAL HIGH (ref 0.00–0.07)
Basophils Absolute: 0 10*3/uL (ref 0.0–0.1)
Basophils Relative: 0 %
Eosinophils Absolute: 0.3 10*3/uL (ref 0.0–0.5)
Eosinophils Relative: 3 %
Immature Granulocytes: 1 %
Lymphocytes Relative: 12 %
Lymphs Abs: 1.3 10*3/uL (ref 0.7–4.0)
Monocytes Absolute: 0.7 10*3/uL (ref 0.1–1.0)
Monocytes Relative: 7 %
Neutro Abs: 8 10*3/uL — ABNORMAL HIGH (ref 1.7–7.7)
Neutrophils Relative %: 77 %

## 2018-09-03 LAB — COMPREHENSIVE METABOLIC PANEL
ALT: 14 U/L (ref 0–44)
AST: 15 U/L (ref 15–41)
Albumin: 2.4 g/dL — ABNORMAL LOW (ref 3.5–5.0)
Alkaline Phosphatase: 45 U/L (ref 38–126)
Anion gap: 2 — ABNORMAL LOW (ref 5–15)
BUN: 21 mg/dL (ref 8–23)
CO2: 28 mmol/L (ref 22–32)
Calcium: 8.2 mg/dL — ABNORMAL LOW (ref 8.9–10.3)
Chloride: 110 mmol/L (ref 98–111)
Creatinine, Ser: 1.11 mg/dL (ref 0.61–1.24)
Glucose, Bld: 114 mg/dL — ABNORMAL HIGH (ref 70–99)
Potassium: 3.8 mmol/L (ref 3.5–5.1)
Sodium: 140 mmol/L (ref 135–145)
Total Bilirubin: 0.5 mg/dL (ref 0.3–1.2)
Total Protein: 6.1 g/dL — ABNORMAL LOW (ref 6.5–8.1)

## 2018-09-03 LAB — CBC
HCT: 33.7 % — ABNORMAL LOW (ref 39.0–52.0)
Hemoglobin: 10.5 g/dL — ABNORMAL LOW (ref 13.0–17.0)
MCH: 28.2 pg (ref 26.0–34.0)
MCHC: 31.2 g/dL (ref 30.0–36.0)
MCV: 90.6 fL (ref 80.0–100.0)
Platelets: 274 10*3/uL (ref 150–400)
RBC: 3.72 MIL/uL — ABNORMAL LOW (ref 4.22–5.81)
RDW: 13.8 % (ref 11.5–15.5)
WBC: 10.5 10*3/uL (ref 4.0–10.5)
nRBC: 0 % (ref 0.0–0.2)

## 2018-09-03 LAB — TRIGLYCERIDES: Triglycerides: 133 mg/dL (ref <150)

## 2018-09-03 LAB — MAGNESIUM: Magnesium: 2.1 mg/dL (ref 1.7–2.4)

## 2018-09-03 LAB — GLUCOSE, CAPILLARY
Glucose-Capillary: 114 mg/dL — ABNORMAL HIGH (ref 70–99)
Glucose-Capillary: 93 mg/dL (ref 70–99)
Glucose-Capillary: 97 mg/dL (ref 70–99)

## 2018-09-03 LAB — PREALBUMIN: Prealbumin: 9.6 mg/dL — ABNORMAL LOW (ref 18–38)

## 2018-09-03 LAB — PHOSPHORUS: PHOSPHORUS: 2.8 mg/dL (ref 2.5–4.6)

## 2018-09-03 MED ORDER — TRAVASOL 10 % IV SOLN
INTRAVENOUS | Status: AC
  Administered 2018-09-03: 17:00:00 via INTRAVENOUS
  Filled 2018-09-03: qty 778.8

## 2018-09-03 NOTE — Progress Notes (Signed)
PROGRESS NOTE        PATIENT DETAILS Name: Luis Lyons Age: 77 y.o. Sex: male Date of Birth: 12/16/1940 Admit Date: 08/28/2018 Admitting Physician [name], MD [id number], [name], MD  Brief Narrative: 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  Upon further evaluation he was found to have pneumonia and a small bowel obstruction.  See below for further details  Subjective: No BM yet-but passing flatus  Assessment/Plan: SBO: Continues to have persistent SBO-no BM.  Continue PNA.  NG tube replaced under fluoroscopy on 12/1 after it became dislodged.  General surgery following-and contemplating surgery over the next few days.  Will await further recommendations from general surgery.     Pneumonia: Clinically improved-afebrile-only has few scattered wheezing today.  Repeat chest x-ray on 12/1 showed significant clinical improvement.  Has completed 7 days of Rocephin-stop Rocephin today.    AKI: Resolved-likely hemodynamically mediated.  Follow electrolytes periodically.  History of CVA with chronic left-sided weakness: Remains at baseline-resume aspirin once oral intake has been resumed.    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  Antimicrobial agents: Anti-infectives (From admission, onward)   Start     Dose/Rate Route Frequency Ordered Stop   08/28/18 2230  cefTRIAXone  (ROCEPHIN) 2 g in sodium chloride 0.9 % 100 mL IVPB     2 g 200 mL/hr over 30 Minutes Intravenous Every 24 hours 08/28/18 2215     08/28/18 2230  azithromycin (ZITHROMAX) 500 mg in sodium chloride 0.9 % 250 mL IVPB     500 mg 250 mL/hr over 60 Minutes Intravenous Every 24 hours 08/28/18 2215 09/01/18 2229      Procedures: None  CONSULTS:  general surgery  Time spent: 25- minutes-Greater than 50% of this time was spent in counseling, explanation of diagnosis, planning of further management, and coordination of care.  MEDICATIONS: Scheduled Meds: . enoxaparin (LOVENOX) injection  40 mg Subcutaneous Q24H  . insulin aspart  0-9 Units Subcutaneous Q6H  . ipratropium-albuterol  3 mL Nebulization BID  . mouth rinse  15 mL Mouth Rinse BID  . polyvinyl alcohol  1 drop Both Eyes BID   Continuous Infusions: . 0.9 % NaCl with KCl 20 mEq / L 125 mL/hr at 09/03/18 0401  . cefTRIAXone (ROCEPHIN)  IV Stopped (09/03/18 0016)  . TPN ADULT (ION) 30 mL/hr at 09/03/18 0401   PRN Meds:.acetaminophen **OR** acetaminophen, albuterol, hydrALAZINE, morphine injection, ondansetron **OR** ondansetron (ZOFRAN) IV, sodium chloride flush   PHYSICAL EXAM: Vital signs: Vitals:   09/03/18 0036 09/03/18 0450 09/03/18 0600 09/03/18 0824  BP: (!) 160/65 (!) 173/73    Pulse: 71 84    Resp:  19    Temp: 98.2 F (36.8 C) 98.3 F (36.8 C) 99.2 F (37.3 C)   TempSrc: Oral Oral Oral   SpO2:  92%  95%  Weight:      Height:  Filed Weights   08/31/18 0402 09/01/18 1538 09/02/18 0500  Weight: 96 kg 94.8 kg 93.3 kg   Body mass index is 27.14 kg/m.   General appearance:Awake, alert, not in any distress.  Eyes:no scleral icterus. HEENT: Atraumatic and Normocephalic Neck: supple, no JVD. Resp:Good air entry bilaterally, some scattered rhonchi CVS: S1 S2 regular, no murmurs.  GI: Bowel sounds very sluggish, Non tender and not distended with no gaurding, rigidity or rebound.  Ventral hernia with prior  laparotomy scar.   Extremities: B/L Lower Ext shows no edema, both legs are warm to touch Musculoskeletal:No digital cyanosis Skin:No Rash, warm and dry Wounds:N/A  I have personally reviewed following labs and imaging studies  LABORATORY DATA: CBC: Recent Labs  Lab 08/28/18 2142  08/30/18 0530 08/31/18 0544 09/01/18 0312 09/02/18 0435 09/03/18 0356  WBC 15.7*   < > 11.0* 9.3 9.9 11.5* 10.5  NEUTROABS 13.7*  --   --   --   --  8.7* 8.0*  HGB 12.8*   < > 12.0* 12.5* 11.2* 11.7* 10.5*  HCT 41.5   < > 37.9* 40.8 37.9* 39.0 33.7*  MCV 89.6   < > 89.6 91.1 91.5 91.1 90.6  PLT 346   < > 302 299 309 338 274   < > = values in this interval not displayed.    Basic Metabolic Panel: Recent Labs  Lab 08/30/18 0530 08/31/18 0544 09/01/18 0312 09/02/18 0435 09/03/18 0356  NA 141 141 143 143 140  K 3.9 4.2 4.0 3.6 3.8  CL 108 110 110 108 110  CO2 26 23 22 23 28   GLUCOSE 130* 113* 98 92 114*  BUN 18 25* 27* 25* 21  CREATININE 1.42* 1.34* 1.15 1.21 1.11  CALCIUM 9.1 9.0 8.6* 8.6* 8.2*  MG  --   --   --  2.3 2.1  PHOS  --   --   --  2.8 2.8    GFR: Estimated Creatinine Clearance: 64 mL/min (by C-G formula based on SCr of 1.11 mg/dL).  Liver Function Tests: Recent Labs  Lab 08/28/18 2142 08/29/18 0329 09/02/18 0435 09/03/18 0356  AST 16 19 15 15   ALT 13 13 12 14   ALKPHOS 76 71 49 45  BILITOT 0.8 0.6 0.7 0.5  PROT 7.8 7.5 7.1 6.1*  ALBUMIN 3.2* 3.1* 2.7* 2.4*   Recent Labs  Lab 08/28/18 2142  LIPASE 25   No results for input(s): AMMONIA in the last 168 hours.  Coagulation Profile: No results for input(s): INR, PROTIME in the last 168 hours.  Cardiac Enzymes: No results for input(s): CKTOTAL, CKMB, CKMBINDEX, TROPONINI in the last 168 hours.  BNP (last 3 results) No results for input(s): PROBNP in the last 8760 hours.  HbA1C: No results for input(s): HGBA1C in the last 72 hours.  CBG: Recent Labs  Lab 09/02/18 0831 09/02/18 1238 09/02/18 1708  09/03/18 0025 09/03/18 0747  GLUCAP 102* 87 72 97 114*    Lipid Profile: Recent Labs    09/02/18 0435 09/03/18 0356  TRIG 146 133    Thyroid Function Tests: No results for input(s): TSH, T4TOTAL, FREET4, T3FREE, THYROIDAB in the last 72 hours.  Anemia Panel: No results for input(s): VITAMINB12, FOLATE, FERRITIN, TIBC, IRON, RETICCTPCT in the last 72 hours.  Urine analysis:    Component Value Date/Time   COLORURINE YELLOW 08/30/2018 1245   APPEARANCEUR HAZY (A) 08/30/2018 1245   LABSPEC 1.027 08/30/2018 1245   PHURINE 5.0 08/30/2018 1245   [place] 08/30/2018 1245  HGBUR SMALL (A) 08/30/2018 1245   BILIRUBINUR NEGATIVE 08/30/2018 1245   KETONESUR 5 (A) 08/30/2018 1245   PROTEINUR 30 (A) 08/30/2018 1245   NITRITE POSITIVE (A) 08/30/2018 1245   LEUKOCYTESUR MODERATE (A) 08/30/2018 1245    Sepsis Labs: Lactic Acid, Venous    Component Value Date/Time   LATICACIDVEN 1.86 08/28/2018 2153    MICROBIOLOGY: Recent Results (from the past 240 hour(s))  Blood Culture (routine x 2)     Status: None   Collection Time: 08/28/18  9:50 PM  Result Value Ref Range Status   Specimen Description BLOOD LEFT FOREARM  Final   Special Requests   Final    BOTTLES DRAWN AEROBIC AND ANAEROBIC Blood Culture adequate volume   Culture   Final    NO GROWTH 5 DAYS Performed at [facility], [place] [address], [place], [place] [address]    Report Status 09/02/2018 FINAL  Final  Blood Culture (routine x 2)     Status: None   Collection Time: 08/28/18 10:00 PM  Result Value Ref Range Status   Specimen Description BLOOD RIGHT HAND  Final   Special Requests   Final    BOTTLES DRAWN AEROBIC AND ANAEROBIC Blood Culture adequate volume   Culture   Final    NO GROWTH 5 DAYS Performed at [facility], [place] [address] [place], [place] [address]    Report Status 09/02/2018 FINAL  Final  MRSA PCR Screening     Status: None   Collection Time: 08/29/18  2:00 AM  Result Value  Ref Range Status   MRSA by PCR NEGATIVE NEGATIVE Final    Comment:        The GeneXpert MRSA Assay (FDA approved for NASAL specimens only), is one component of a comprehensive MRSA colonization surveillance program. It is not intended to diagnose MRSA infection nor to guide or monitor treatment for MRSA infections. Performed at [facility], [place] [address], [place], [place] [address]     RADIOLOGY STUDIES/RESULTS: Dg Chest 1 View  Result Date: 09/02/2018 CLINICAL DATA:  Wheezing. EXAM: CHEST  1 VIEW COMPARISON:  09/02/2018 FINDINGS: Right arm PICC line tip is in the cavoatrial junction. There is a feeding tube in place. Heart size normal. No pleural effusion or edema. No airspace opacities identified. IMPRESSION: No acute cardiopulmonary abnormalities. Electronically Signed   By: Kerby Moors M.D.   On: 09/02/2018 15:46   Dg Abd 1 View  Result Date: 09/03/2018 CLINICAL DATA:  Small-bowel obstruction.  NG tube. EXAM: ABDOMEN - 1 VIEW COMPARISON:  Abdominal radiographic series-09/02/2018; 09/01/2018; 08/29/2018; CT abdomen pelvis-11/26/fluoroscopic guided nasogastric tube placement-09/02/2018 FINDINGS: There is been apparent exchange of existing weighted tip feeding tube with new enteric tube tip overlying the expected location of the descending portion of the duodenum. Re demonstrated moderate gaseous distension of several rather patulous loops of small bowel, unchanged to slightly improved compared to the 09/02/2018 examination with index loop of small bowel in the right mid/lower abdomen measuring 5.6 cm in diameter. Nondiagnostic evaluation for pneumoperitoneum secondary to supine positioning exclusion of the lower thorax. Stigmata of DISH throughout the thoracic spine. Post ORIF of the left femur and femoral neck. IMPRESSION: 1. No change to slight improvement in findings again concerning for small bowel obstruction. 2. Apparent change of enteric tube with new enteric tube tip  terminating over the expected location of the descending portion duodenum. Electronically Signed   By: Sandi Mariscal M.D.   On: 09/03/2018 07:46  Dg Abd 1 View  Result Date: 09/01/2018 CLINICAL DATA:  SBO, upright image requested by MD. EXAM: ABDOMEN - 1 VIEW COMPARISON:  08/30/2018 FINDINGS: Nasogastric tube has been advanced into the proximal duodenum. The stomach appears decompressed. There are multiple gas distended small bowel loops identified in the upper abdomen. The lower abdomen is excluded. Visualized portions of colon are nondilated. There is no convincing free air. Visualized lung bases unremarkable. IMPRESSION: 1. Nasogastric tube advancement into the proximal duodenum, with persistent small bowel distention. Electronically Signed   By: Lucrezia Europe M.D.   On: 09/01/2018 07:57   Dg Abd 1 View  Result Date: 08/30/2018 CLINICAL DATA:  Small bowel obstruction EXAM: ABDOMEN - 1 VIEW COMPARISON:  August 29, 2018 FINDINGS: The NG tube terminates in the proximal stomach. Small bowel dilatation is less prominent on this study but remains. No other acute abnormalities. IMPRESSION: The small bowel dilatation is less prominent but remains consistent with persistent small-bowel obstruction. The enteric tube appears to terminate in the proximal stomach. Electronically Signed   By: Dorise Bullion III M.D   On: 08/30/2018 13:22   Dg Abd 1 View  Result Date: 08/29/2018 CLINICAL DATA:  Replacement of NG tube. EXAM: ABDOMEN - 1 VIEW COMPARISON:  One-view abdomen 08/29/2018 at 8:46 a.m. FINDINGS: Side port of the NG tube is beyond the GE junction. Mildly dilated loops of small bowel are similar the prior exam. No free air is present. IMPRESSION: 1. Side port of the NG tube is beyond the GE junction. 2. Similar appearance of multiple dilated loops of small bowel without evidence of free air. Electronically Signed   By: San Morelle M.D.   On: 08/29/2018 13:08   Dg Abd 1 View  Result Date:  08/29/2018 CLINICAL DATA:  Check nasogastric catheter placement EXAM: ABDOMEN - 1 VIEW COMPARISON:  None. FINDINGS: 30 seconds of fluoroscopy was utilized for placement. The nasogastric catheter is shown coiled within the gastric lumen. IMPRESSION: Nasogastric catheter placed in the stomach. Electronically Signed   By: Inez Catalina M.D.   On: 08/29/2018 09:08   Ct Abdomen Pelvis W Contrast  Result Date: 08/28/2018 CLINICAL DATA:  Abdominal pain with shortness of breath EXAM: CT ABDOMEN AND PELVIS WITH CONTRAST TECHNIQUE: Multidetector CT imaging of the abdomen and pelvis was performed using the standard protocol following bolus administration of intravenous contrast. CONTRAST:  137mL OMNIPAQUE IOHEXOL 300 MG/ML  SOLN COMPARISON:  None. FINDINGS: LOWER CHEST: There is no basilar pleural or apical pericardial effusion. HEPATOBILIARY: The hepatic contours and density are normal. There is no intra- or extrahepatic biliary dilatation. There is cholelithiasis without acute inflammation. PANCREAS: The pancreatic parenchymal contours are normal and there is no ductal dilatation. There is no peripancreatic fluid collection. SPLEEN: Normal. ADRENALS/URINARY TRACT: --Adrenal glands: Normal. --Right kidney/ureter: Mild atrophy. No hydronephrosis. --Left kidney/ureter: Vascular calcifications and interpolar nonobstructive 3 mm nephrolithiasis. No hydronephrosis. --Urinary bladder: Mild bladder wall thickening, possibly due to underdistention. STOMACH/BOWEL: --Stomach/Duodenum: There is no hiatal hernia or other gastric abnormality. The duodenal course and caliber are normal. --Small bowel: There are multiple loops of dilated small bowel in the central abdomen, predominantly anteriorly. One of these loops is directly beneath the skin surface, extending through a diastatic defect of the anterior abdominal muscles. Suspected transition point is in the anterior low midline abdomen (coronal image 24 and sagittal image 70).  --Colon: No focal abnormality. --Appendix: Not visualized. No right lower quadrant inflammation or free fluid. VASCULAR/LYMPHATIC: Atherosclerotic calcification is present within the non-aneurysmal  abdominal aorta, without hemodynamically significant stenosis. The portal vein, splenic vein, superior mesenteric vein and IVC are patent. No abdominal or pelvic lymphadenopathy. REPRODUCTIVE: Normal prostate size with symmetric seminal vesicles. MUSCULOSKELETAL. Compression deformity of L2 is probably chronic. There is grade 1 L4-5 anterolisthesis secondary to facet arthrosis. OTHER: None. IMPRESSION: 1. Small bowel obstruction with suspected transition point in the low anterior midline abdomen. 2. Cholelithiasis without acute inflammation. 3. Nonobstructing left nephrolithiasis. Aortic Atherosclerosis (ICD10-I70.0). Electronically Signed   By: Ulyses Jarred M.D.   On: 08/28/2018 23:21   Dg Chest Port 1 View  Result Date: 08/28/2018 CLINICAL DATA:  77 y/o  M; abdominal pain and shortness of breath. EXAM: PORTABLE CHEST 1 VIEW COMPARISON:  None. FINDINGS: Normal cardiac silhouette. Aortic atherosclerosis with calcification. Perihilar and basilar reticular opacities. No consolidation. No pleural effusion or pneumothorax. No acute osseous abnormality is evident. IMPRESSION: Basilar reticular opacities may represent mild interstitial edema or atypical pneumonia. No consolidation. Electronically Signed   By: Kristine Garbe M.D.   On: 08/28/2018 21:55   Dg Abd Portable 1v  Result Date: 08/29/2018 CLINICAL DATA:  Small bowel obstruction, 8 hour delayed film after Gastrografin EXAM: PORTABLE ABDOMEN - 1 VIEW COMPARISON:  Abdominal radiograph from earlier today FINDINGS: Enteric tube terminates in the proximal stomach. Moderately dilated small bowel loops throughout the abdomen are not appreciably changed. Oral contrast transits to the mid small bowel. No definite oral contrast in the colon. Moderate colonic  stool. No evidence of pneumatosis or pneumoperitoneum. Clear lung bases. IMPRESSION: 1. Enteric tube terminates in the stomach. 2. Stable moderately dilated small bowel loops compatible with small-bowel obstruction. Oral contrast transits to the mid small bowel, with no definite oral contrast in the colon. Electronically Signed   By: Ilona Sorrel M.D.   On: 08/29/2018 21:38   Dg Abd Portable 1v-small Bowel Obstruction Protocol-initial, 8 Hr Delay  Result Date: 08/29/2018 CLINICAL DATA:  Follow up small bowel obstruction EXAM: PORTABLE ABDOMEN - 1 VIEW COMPARISON:  08/28/2018 FINDINGS: Nasogastric catheter is now noted within the stomach. Fecal material is noted throughout the colon. Contrast is seen within the bladder from recent CT examination. Multiple dilated small bowel loops are again identified in the mid abdomen stable from the recent exam. No acute bony abnormality is noted. Prior L2 compression deformity is noted. IMPRESSION: Stable small bowel dilatation following nasogastric catheter placement Electronically Signed   By: Inez Catalina M.D.   On: 08/29/2018 09:09   Dg Abd Portable 2v  Result Date: 09/02/2018 CLINICAL DATA:  Initial evaluation for possible foreign body, missing piece of NG tube. EXAM: PORTABLE ABDOMEN - 2 VIEW COMPARISON:  Prior radiograph from 09/01/2018. FINDINGS: Previously seen enteric tube has been removed. No visible tube fragment or other radiopaque foreign body identified. Persistent small bowel dilatation noted within the upper abdomen, suggestive of ongoing small bowel obstruction. This appears improved from previous. Partially visualized lung bases are grossly clear. IMPRESSION: 1. No visible tube fragment or other radiopaque foreign body identified. 2. Persistent but improved small bowel obstruction. Electronically Signed   By: Jeannine Boga M.D.   On: 09/02/2018 05:52   Dg Addison Bailey G Tube Plc W/fl W/rad  Result Date: 09/02/2018 CLINICAL DATA:  NG tube placement.  EXAM: NASO G TUBE PLACEMENT WITH FL AND WITH RAD CONTRAST:  Water-soluble contrast FLUOROSCOPY TIME:  Fluoroscopy Time:  5 minutes 48 seconds Radiation Exposure Index (if provided by the fluoroscopic device): Number of Acquired Spot Images: 2 COMPARISON:  09/02/2018 FINDINGS: Under direct  fluoroscopic guidance a weighted nasogastric tube was placed into the right nares. The tube was then guided into the esophagus, past the GE junction and into the gastric lumen. The tube was then manipulated into the third portion of the duodenum. A small volume of water-soluble contrast material was injected into the tube confirming placement of the tube at the level of the duodenal jejunal junction. IMPRESSION: 1. Successful fluoroscopic guided weighted nasogastric tube placement. The tip is at the level of the duodenal jejunal junction and is ready for use. Electronically Signed   By: Kerby Moors M.D.   On: 09/02/2018 11:43   [place] Ekg Site Rite  Result Date: 09/01/2018 If Site Rite image not attached, placement could not be confirmed due to current cardiac rhythm.    LOS: 6 days   Oren Binet, MD  [facility]  If 7PM-7AM, please contact night-coverage  Please page via [email or web address]-click on MD name and type text message  09/03/2018, 9:09 AM

## 2018-09-03 NOTE — Progress Notes (Signed)
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 I am concerned that this patient is not going to improve without operative intervention - NG output 1,200 mL, pt having ice chips so not sure about true output  FEN - NPO, NG tube to LIWS, TNA 11/30>> prealbumin 11 ID - Azithromycin 11/26 >>, Rocephin 11/26 >> ; afebrile this AM, WBC9.9 VTE - SCD's, Lovenox  Plan:we will follow, AXR AM, Suspect he will need an operation this week.    LOS: 6 days    Subjective: CC: SBO  Pt states he is having flatus. NGT pulled out night of 11/30 and pt had one episode of emesis. No family at bedside. Pt is not ambulatory at baseline due to hx of CVA.   Objective: Vital signs in last 24 hours: Temp:  [97.7 F (36.5 C)-99.2 F (37.3 C)] 99.2 F (37.3 C) (12/02 0600) Pulse Rate:  [71-95] 84 (12/02 0450) Resp:  [18-20] 19 (12/02 0450) BP: (160-173)/(65-84) 173/73 (12/02 0450) SpO2:  [92 %-100 %] 95 % (12/02 0824) Last BM Date: 08/26/18  Intake/Output from previous day: 12/01 0701 - 12/02 0700 In: 1992.7 [P.O.:1400; I.V.:530.2; IV Piggyback:62.5] Out: 2250 [Urine:2250] Intake/Output this shift: Total I/O In: 3117.7 [I.V.:2775.6; IV Piggyback:342.1] Out: -   PE: Gen:  Alert, NAD, pleasant, cooperative HEENT: mandibular deformity from previous surgery, NGT in place Abd: Soft, mild distention, +BS, large reducible ventral hernia, generalized TTP without guarding, no peritonitis  Skin: no rashes noted, warm and dry  Anti-infectives: Anti-infectives (From admission, onward)    Start     Dose/Rate Route Frequency Ordered Stop   08/28/18 2230  cefTRIAXone (ROCEPHIN) 2 g in sodium chloride 0.9 % 100 mL IVPB     2 g 200 mL/hr over 30 Minutes Intravenous Every 24 hours 08/28/18 2215     08/28/18 2230  azithromycin (ZITHROMAX) 500 mg in sodium chloride 0.9 % 250 mL IVPB     500 mg 250 mL/hr over 60 Minutes Intravenous Every 24 hours 08/28/18 2215 09/01/18 2229      Lab Results:  Recent Labs    09/02/18 0435 09/03/18 0356  WBC 11.5* 10.5  HGB 11.7* 10.5*  HCT 39.0 33.7*  PLT 338 274   BMET Recent Labs    09/02/18 0435 09/03/18 0356  NA 143 140  K 3.6 3.8  CL 108 110  CO2 23 28  GLUCOSE 92 114*  BUN 25* 21  CREATININE 1.21 1.11  CALCIUM 8.6* 8.2*   PT/INR No results for input(s): LABPROT, INR in the last 72 hours. CMP     Component Value Date/Time   NA 140 09/03/2018 0356   K 3.8 09/03/2018 0356   CL 110 09/03/2018 0356   CO2 28 09/03/2018 0356   GLUCOSE 114 (H) 09/03/2018 0356   BUN 21 09/03/2018 0356   CREATININE 1.11 09/03/2018 0356   CALCIUM 8.2 (L) 09/03/2018 0356   PROT 6.1 (L) 09/03/2018 0356   ALBUMIN 2.4 (L) 09/03/2018 0356   AST 15 09/03/2018 0356   ALT 14 09/03/2018 0356   ALKPHOS 45 09/03/2018 0356   BILITOT 0.5 09/03/2018 0356  GFRNONAA >60 09/03/2018 0356   GFRAA >60 09/03/2018 0356   Lipase     Component Value Date/Time   LIPASE 25 08/28/2018 2142    Studies/Results: Dg Chest 1 View  Result Date: 09/02/2018 CLINICAL DATA:  Wheezing. EXAM: CHEST  1 VIEW COMPARISON:  09/02/2018 FINDINGS: Right arm PICC line tip is in the cavoatrial junction. There is a feeding tube in place. Heart size normal. No pleural effusion or edema. No airspace opacities identified. IMPRESSION: No acute cardiopulmonary abnormalities. Electronically Signed   By: Kerby Moors M.D.   On: 09/02/2018 15:46   Dg Abd 1 View  Result Date: 09/03/2018 CLINICAL DATA:  Small-bowel obstruction.  NG tube. EXAM: ABDOMEN - 1 VIEW COMPARISON:  Abdominal  radiographic series-09/02/2018; 09/01/2018; 08/29/2018; CT abdomen pelvis-11/26/fluoroscopic guided nasogastric tube placement-09/02/2018 FINDINGS: There is been apparent exchange of existing weighted tip feeding tube with new enteric tube tip overlying the expected location of the descending portion of the duodenum. Re demonstrated moderate gaseous distension of several rather patulous loops of small bowel, unchanged to slightly improved compared to the 09/02/2018 examination with index loop of small bowel in the right mid/lower abdomen measuring 5.6 cm in diameter. Nondiagnostic evaluation for pneumoperitoneum secondary to supine positioning exclusion of the lower thorax. Stigmata of DISH throughout the thoracic spine. Post ORIF of the left femur and femoral neck. IMPRESSION: 1. No change to slight improvement in findings again concerning for small bowel obstruction. 2. Apparent change of enteric tube with new enteric tube tip terminating over the expected location of the descending portion duodenum. Electronically Signed   By: Sandi Mariscal M.D.   On: 09/03/2018 07:46   Dg Abd Portable 2v  Result Date: 09/02/2018 CLINICAL DATA:  Initial evaluation for possible foreign body, missing piece of NG tube. EXAM: PORTABLE ABDOMEN - 2 VIEW COMPARISON:  Prior radiograph from 09/01/2018. FINDINGS: Previously seen enteric tube has been removed. No visible tube fragment or other radiopaque foreign body identified. Persistent small bowel dilatation noted within the upper abdomen, suggestive of ongoing small bowel obstruction. This appears improved from previous. Partially visualized lung bases are grossly clear. IMPRESSION: 1. No visible tube fragment or other radiopaque foreign body identified. 2. Persistent but improved small bowel obstruction. Electronically Signed   By: Jeannine Boga M.D.   On: 09/02/2018 05:52   Dg Addison Bailey G Tube Plc W/fl W/rad  Result Date: 09/02/2018 CLINICAL DATA:  NG tube placement. EXAM:  NASO G TUBE PLACEMENT WITH FL AND WITH RAD CONTRAST:  Water-soluble contrast FLUOROSCOPY TIME:  Fluoroscopy Time:  5 minutes 48 seconds Radiation Exposure Index (if provided by the fluoroscopic device): Number of Acquired Spot Images: 2 COMPARISON:  09/02/2018 FINDINGS: Under direct fluoroscopic guidance a weighted nasogastric tube was placed into the right nares. The tube was then guided into the esophagus, past the GE junction and into the gastric lumen. The tube was then manipulated into the third portion of the duodenum. A small volume of water-soluble contrast material was injected into the tube confirming placement of the tube at the level of the duodenal jejunal junction. IMPRESSION: 1. Successful fluoroscopic guided weighted nasogastric tube placement. The tip is at the level of the duodenal jejunal junction and is ready for use. Electronically Signed   By: Kerby Moors M.D.   On: 09/02/2018 11:43   [place] Ekg Site Rite  Result Date: 09/01/2018 If Site Rite image not attached, placement could not be confirmed due to current cardiac rhythm.     Kalman Drape ,  [facility] 09/03/2018, 8:40 AM  Pager: [phone] Mon-Wed, Friday 7:00am-4:30pm Thurs 7am-11:30am  Consults: [phone]

## 2018-09-03 NOTE — Progress Notes (Signed)
PHARMACY - ADULT TOTAL PARENTERAL NUTRITION CONSULT NOTE   Pharmacy Consult:  TPN Indication:  SBO  Patient Measurements: Height: 6\' 1"  (185.4 cm) Weight: 205 lb 11 oz (93.3 kg)(bedweight) IBW/kg (Calculated) : 79.9 TPN AdjBW (KG): 90.7 Body mass index is 27.14 kg/m.  Usual body weight = 91 kg  Assessment:  77 YOM presented on 08/28/18 with abdominal pain and SOB.  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  Also with h/o ruptured appendicitis requiring ex-lap and his hernia is enlarging with chronic cough. CT showed SBO with gallbladder stones.  Patient has been NPO for 5 days with limited PO intake since 08/24/18 per documentation.  MD concerned patient will not improve without surgical intervention; therefore, Pharmacy consulted to initiate TPN.  Patient is at risk for refeeding.  PMH: HTN, ETOH, prostate cancer, m outh cancer, CVA  WU:JWJXBJYNW hernia, SBO. baseline prealbumin low at 11.4, LBM 11/25, +flatus, NG out. Prealbumin lower today at 9.6. Replace NG 12/1. Possible need for surgery  Endo: no hx DM - AM glucose WNL Insulin requirements in the past 24 hours: 0  Lytes: all WNL. Monitor closely for refeeding.  Renal: SCr 1.11, BUN 21 - UOP 1 ml/kg/hr, net -2.1L - NS 20K at 125/hr  Pulm:  Duonebs Cards: HTN  Hepatobil: LFTs / tbili / TG WNL 133 Neuro: h/o CVA ID: r/o aspiration, on CTX (12/26 >> ), off azith for PNA - afebrile, WBC WNL, BCx negat TPN Access: PICC placed 09/01/18  TPN start date: 09/02/18  Nutritional Goals (RD rec on 09/01/18): 2100-2300 kCal, 110-125gm protein per day  Current Nutrition:  NPO  Plan:  IncreaseTPN to 55 ml/hr (goal rate 85 ml/hr) - TPN will provide 78g AA, 198g CHO and 42g ILE for a total of 1407 kCal, meeting ~70% of patient's needs -Electrolytes in TPN: standard  -Daily multivitamin and trace elements in TPN.  -Add thiamine per RD's request.  D/C thiamine once stable at goal rate. -Start  sensitive SSI Q6H -Continue NS 20K at 125 ml/hr per MD.  F/U NG output/volume status with TPN initiation. - Labs in AM to assess for any refeeding   Joie Reamer S. Alford Highland, PharmD, BCPS Clinical Staff Pharmacist [email or web address] 09/03/2018, 10:41 AM

## 2018-09-04 ENCOUNTER — Inpatient Hospital Stay (HOSPITAL_COMMUNITY): Payer: Medicare Other

## 2018-09-04 ENCOUNTER — Encounter (HOSPITAL_COMMUNITY): Payer: Self-pay

## 2018-09-04 LAB — BASIC METABOLIC PANEL
Anion gap: 7 (ref 5–15)
BUN: 15 mg/dL (ref 8–23)
CO2: 23 mmol/L (ref 22–32)
CREATININE: 0.91 mg/dL (ref 0.61–1.24)
Calcium: 8.5 mg/dL — ABNORMAL LOW (ref 8.9–10.3)
Chloride: 108 mmol/L (ref 98–111)
GFR calc Af Amer: >60 mL/min (ref >60)
GFR calc non Af Amer: >60 mL/min (ref >60)
Glucose, Bld: 127 mg/dL — ABNORMAL HIGH (ref 70–99)
POTASSIUM: 4 mmol/L (ref 3.5–5.1)
SODIUM: 138 mmol/L (ref 135–145)

## 2018-09-04 LAB — GLUCOSE, CAPILLARY
Glucose-Capillary: 102 mg/dL — ABNORMAL HIGH (ref 70–99)
Glucose-Capillary: 114 mg/dL — ABNORMAL HIGH (ref 70–99)
Glucose-Capillary: 123 mg/dL — ABNORMAL HIGH (ref 70–99)
Glucose-Capillary: 93 mg/dL (ref 70–99)

## 2018-09-04 LAB — MAGNESIUM: Magnesium: 2.2 mg/dL (ref 1.7–2.4)

## 2018-09-04 LAB — PHOSPHORUS: Phosphorus: 2.9 mg/dL (ref 2.5–4.6)

## 2018-09-04 MED ORDER — TRAVASOL 10 % IV SOLN
INTRAVENOUS | Status: AC
  Administered 2018-09-04: 17:00:00 via INTRAVENOUS
  Filled 2018-09-04: qty 1203.6

## 2018-09-04 MED ORDER — POTASSIUM CHLORIDE IN NACL 20-0.9 MEQ/L-% IV SOLN
INTRAVENOUS | Status: DC
  Administered 2018-09-04: via INTRAVENOUS
  Filled 2018-09-04: qty 1000

## 2018-09-04 MED ORDER — FLEET ENEMA 7-19 GM/118ML RE ENEM
1.0000 | ENEMA | Freq: Once | RECTAL | Status: AC
  Administered 2018-09-04: 1 via RECTAL
  Filled 2018-09-04: qty 1

## 2018-09-04 NOTE — Progress Notes (Signed)
PHARMACY - ADULT TOTAL PARENTERAL NUTRITION CONSULT NOTE   Pharmacy Consult:  TPN Indication:  SBO  Patient Measurements: Height: 6\' 1"  (185.4 cm) Weight: 205 lb 11 oz (93.3 kg)(bedweight) IBW/kg (Calculated) : 79.9 TPN AdjBW (KG): 90.7 Body mass index is 27.14 kg/m.  Usual body weight = 91 kg  Assessment:  77 YOM presented on 08/28/18 with abdominal pain and SOB.  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  Also with h/o ruptured appendicitis requiring ex-lap and his hernia is enlarging with chronic cough. CT showed SBO with gallbladder stones.  Patient has been NPO for 5 days with limited PO intake since 08/24/18 per documentation.  MD concerned patient will not improve without surgical intervention; therefore, Pharmacy consulted to initiate TPN.  Patient is at risk for refeeding.  PMH: HTN, ETOH, prostate cancer, mouth cancer, CVA  GI: Enlarging hernia, SBO. Baseline prealbumin low at 11.4, LBM 12/2, +flatus.  Prealbumin lower today at 9.6. NG with 1800 out. Possible need for surgery  Endo: no hx DM : CBGs WNL Insulin requirements in the past 24 hours: 1 unit  Lytes: all WNL. Monitor closely for refeeding.  Renal: SCr 0.9, BUN 15 - UOP 0.3 ml/kg/hr (down), net +1.9L now - NS 20K at 125/hr  Pulm:  Duonebs Cards: HTN  Hepatobil: LFTs / tbili / TG WNL 133 Neuro: h/o CVA ID: r/o aspiration, on CTX (12/26 >> ), off azith for PNA - afebrile, WBC WNL, BCx negat TPN Access: PICC placed 09/01/18  TPN start date: 09/02/18  Nutritional Goals (RD rec on 09/01/18): 2100-2300 kCal, 110-125gm protein per day  Current Nutrition:  NPO  Plan:  IncreaseTPN to 85 ml/hr  - TPN will provide 120g AA, 306g CHO and 65g ILE for a total of 2175 kCal, meeting 100% of patient's needs -Electrolytes in TPN: standard  -Daily multivitamin and trace elements in TPN.  -Add thiamine per RD's request.  D/C thiamine once stable at goal rate. -Start sensitive SSI  Q6H -Decrease NS 20K to 40 ml/hr.  F/U NG output/volume status. - Labs again in AM to assess for any refeeding   Maelyn Berrey S. Alford Highland, PharmD, BCPS Clinical Staff Pharmacist [email or web address] 09/04/2018, 9:46 AM

## 2018-09-04 NOTE — Progress Notes (Signed)
PT Cancellation Note  Patient Details Name: Luis Lyons MRN: [id number] DOB: [birth year]   Cancelled Treatment:    Reason Eval/Treat Not Completed: Other (comment).  Pt was seen for attempted visit and declined PT today, but his wife states she has helped him do some leg exercise.  Asked him to plan to get up OOB with PT tomorrow and he agreed.   Ramond Dial 09/04/2018, 5:24 PM   Mee Hives, PT MS Acute Rehab Dept. Number: [place]

## 2018-09-04 NOTE — Progress Notes (Signed)
Nutrition Follow-up  DOCUMENTATION CODES:   Not applicable  INTERVENTION:   - TPN management per pharmacy  - Will monitor for diet advancement and supplement as appropriate  NUTRITION DIAGNOSIS:   Inadequate oral intake related to inability to eat as evidenced by NPO status.  Ongoing  GOAL:   Patient will meet greater than or equal to 90% of their needs  Met via TPN at goal rate  MONITOR:   Diet advancement, Labs, Weight trends, I & O's, Supplement acceptance  REASON FOR ASSESSMENT:   Consult New TPN/TNA  ASSESSMENT:   77 y/o male PMHx CVA w/ L side hemiplegia, prostate cancer and H&N cancer s/p surgery/radiation w/ PEG/trach. Presented to ED w/ diffuse abdominal pain, SOB, chills, cough x 2 days. In ED, CT abdomen showed SBO w/ transition point and CXR showed infiltrates. Admitted for management of SBO and CAP.   11/30 - NGT pulled out, one episode of emesis 12/1 - TPN started at 30 ml/hr, NGT replaced under fluoroscopy 12/2 - TPN increased to 55 ml/hr 12/3 - TPN increased to goal rate of 85 ml/hr  Spoke with pt's daughter at bedside. Pt sleeping with NGT secure with tape.  Per pt's daughter, RN to come flush pt's NGT soon as pt's daughter believes it is clogged with mucus. Pt's daughter reports that last time pt's output decreased, it was because of this and that the output increased right after flushing the tube.  Per surgery note, may consider clamping trials tomorrow.  Pt's daughter reports that pt had a good appetite PTA and would "eat anything you put in front of him."  TPN to increase to goal rate tonight: 85 ml/hr to provide 2175 kcal, 120 grams protein, 306 grams CHO, and 65 grams ILE (meets 100% of estimated needs)  Medications reviewed and include: SSI, NaCl with KCl 20 mEq/L at 70 ml/hr, TPN  Labs reviewed: electrolytes WNL CBG's: 114, 93, 123, 93 x 24 hours  NGT: 1800 ml output x 24 hours I/O's: +2.9 L since admit UOP: 700 ml x 24 hours  Diet  Order:   Diet Order            Diet NPO time specified Except for: Ice Chips  Diet effective now              EDUCATION NEEDS:   No education needs have been identified at this time  Skin:  Skin Assessment: Skin Integrity Issues: Other: MASD to groin  Last BM:  per surgery note: 12/3  Height:   Ht Readings from Last 1 Encounters:  08/28/18 6' 1"  (1.854 m)    Weight:   Wt Readings from Last 1 Encounters:  09/02/18 93.3 kg    Ideal Body Weight:  83.64 kg  BMI:  Body mass index is 27.14 kg/m.  Estimated Nutritional Needs:   Kcal:  2100-2300 (22-24 kcal/kg bw)  Protein:  110-125g Pro (1.2-1.3g/kg bw)  Fluid:  2.1-2.3 L fluid (52m/kcal)    KGaynell Face MS, RD, LDN Inpatient Clinical Dietitian Pager: [phone]/After Hours: [phone]

## 2018-09-04 NOTE — Progress Notes (Signed)
   09/04/18 0631  Vitals  Temp 98.5 F (36.9 C)  Temp Source Oral  BP (!) 173/81  MAP (mmHg) 106  BP Location Left Arm  BP Method Automatic  Patient Position (if appropriate) Lying  Pulse Rate 87  Pulse Rate Source Monitor  Resp 18  Oxygen Therapy  SpO2 97 %  O2 Device Nasal Cannula  O2 Flow Rate (L/min) 3 L/min    Hydralazine 5mg  PRN administered for BP(173/81). To recheck BP

## 2018-09-04 NOTE — Care Management Important Message (Signed)
Important Message  Patient Details  Name: Luis Lyons MRN: [id number] Date of Birth: [birth year]   Medicare Important Message Given:       Orbie Pyo 09/04/2018, 2:13 PM

## 2018-09-04 NOTE — Progress Notes (Signed)
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 I am concerned that this patient is not going to improve without operative intervention - NG output1,000 mL, pt having ice chips so not sure about true output  FEN - NPO, NG tube to LIWS, TNA 11/30>> prealbumin 11 ID - Azithromycin 11/26 >>, Rocephin 11/26 >> ; afebrile this AM, WBC9.9 VTE - SCD's, Lovenox  Plan:we will follow, AXR this am read still pending, Suspect he will need an operation this week.AXR tomorrow am   LOS: 7 days    Subjective: CC: SBO  He states he had a bad night. He said that they hurt his stomach last night when the moved him. He denies flatus or abdominal pain at this time. No family at bedside.   Objective: Vital signs in last 24 hours: Temp:  [98.1 F (36.7 C)-98.5 F (36.9 C)] 98.5 F (36.9 C) (12/03 0631) Pulse Rate:  [64-92] 87 (12/03 0631) Resp:  [16-22] 18 (12/03 0631) BP: (116-173)/(58-81) 173/81 (12/03 0631) SpO2:  [95 %-100 %] 97 % (12/03 0631) Last BM Date: 09/25/18  Intake/Output from previous day: 12/02 0701 - 12/03 0700 In: 6535.4 [I.V.:6193.3; IV Piggyback:342.1] Out: 2500 [Urine:700; Emesis/NG output:1800] Intake/Output this shift: Total I/O In: 1000 [NG/GT:1000] Out: -   PE: Gen: Alert, NAD, pleasant, cooperative HEENT:mandibular deformity from previous surgery, NGT in place Abd: Soft,mild distention, +BS,large reducible ventral hernia, no TTP, no peritonitis Skin: no rashes noted, warm and dry  Anti-infectives: Anti-infectives (From admission, onward)    Start     Dose/Rate Route Frequency Ordered Stop   08/28/18 2230  cefTRIAXone (ROCEPHIN) 2 g in sodium chloride 0.9 % 100 mL IVPB     2 g 200 mL/hr over 30 Minutes Intravenous Every 24 hours 08/28/18 2215 09/03/18 2232   08/28/18 2230  azithromycin (ZITHROMAX) 500 mg in sodium chloride 0.9 % 250 mL IVPB     500 mg 250 mL/hr over 60 Minutes Intravenous Every 24 hours 08/28/18 2215 09/01/18 2229      Lab Results:  Recent Labs    09/02/18 0435 09/03/18 0356  WBC 11.5* 10.5  HGB 11.7* 10.5*  HCT 39.0 33.7*  PLT 338 274   BMET Recent Labs    09/03/18 0356 09/04/18 0404  NA 140 138  K 3.8 4.0  CL 110 108  CO2 28 23  GLUCOSE 114* 127*  BUN 21 15  CREATININE 1.11 0.91  CALCIUM 8.2* 8.5*   PT/INR No results for input(s): LABPROT, INR in the last 72 hours. CMP     Component Value Date/Time   NA 138 09/04/2018 0404   K 4.0 09/04/2018 0404   CL 108 09/04/2018 0404   CO2 23 09/04/2018 0404   GLUCOSE 127 (H) 09/04/2018 0404   BUN 15 09/04/2018 0404   CREATININE 0.91 09/04/2018 0404   CALCIUM 8.5 (L) 09/04/2018 0404   PROT 6.1 (L) 09/03/2018 0356   ALBUMIN 2.4 (L) 09/03/2018 0356   AST 15 09/03/2018 0356   ALT 14 09/03/2018 0356   ALKPHOS 45 09/03/2018 0356   BILITOT 0.5 09/03/2018 0356   GFRNONAA >60 09/04/2018 0404  GFRAA >60 09/04/2018 0404   Lipase     Component Value Date/Time   LIPASE 25 08/28/2018 2142    Studies/Results: Dg Chest 1 View  Result Date: 09/02/2018 CLINICAL DATA:  Wheezing. EXAM: CHEST  1 VIEW COMPARISON:  09/02/2018 FINDINGS: Right arm PICC line tip is in the cavoatrial junction. There is a feeding tube in place. Heart size normal. No pleural effusion or edema. No airspace opacities identified. IMPRESSION: No acute cardiopulmonary abnormalities. Electronically Signed   By: Kerby Moors M.D.   On: 09/02/2018 15:46   Dg Abd 1 View  Result Date: 09/03/2018 CLINICAL DATA:  Small-bowel obstruction.  NG tube. EXAM: ABDOMEN - 1 VIEW  COMPARISON:  Abdominal radiographic series-09/02/2018; 09/01/2018; 08/29/2018; CT abdomen pelvis-11/26/fluoroscopic guided nasogastric tube placement-09/02/2018 FINDINGS: There is been apparent exchange of existing weighted tip feeding tube with new enteric tube tip overlying the expected location of the descending portion of the duodenum. Re demonstrated moderate gaseous distension of several rather patulous loops of small bowel, unchanged to slightly improved compared to the 09/02/2018 examination with index loop of small bowel in the right mid/lower abdomen measuring 5.6 cm in diameter. Nondiagnostic evaluation for pneumoperitoneum secondary to supine positioning exclusion of the lower thorax. Stigmata of DISH throughout the thoracic spine. Post ORIF of the left femur and femoral neck. IMPRESSION: 1. No change to slight improvement in findings again concerning for small bowel obstruction. 2. Apparent change of enteric tube with new enteric tube tip terminating over the expected location of the descending portion duodenum. Electronically Signed   By: Sandi Mariscal M.D.   On: 09/03/2018 07:46   Dg Addison Bailey G Tube Plc W/fl W/rad  Result Date: 09/03/2018 CLINICAL DATA:  Were acquires nasogastric tube placement. EXAM: NASO G TUBE PLACEMENT WITH FL AND WITH RAD CONTRAST:  Water-soluble contrast FLUOROSCOPY TIME:  Fluoroscopy Time:  1 minutes 12 seconds Radiation Exposure Index (if provided by the fluoroscopic device): Number of Acquired Spot Images: 2 COMPARISON:  Earlier today FINDINGS: Patient's indwelling weighted feeding tube was removed. A nasogastric tube was then placed through the patient's right nares and under direct fluoroscopic guidance was manipulated into the stomach. The tube was in extended into the duodenum. A small volume of water-soluble contrast material was injected into the nasogastric tube in the tip terminates at the distal portion of the descending duodenum. IMPRESSION: Removal of indwelling  weighted feeding tube with subsequent fluoroscopic guided placement of nasogastric tube into the proximal duodenum. Contrast injected into the nasogastric tube confirms tip location at the distal portion of the descending duodenum. Electronically Signed   By: Kerby Moors M.D.   On: 09/03/2018 12:51   Dg Addison Bailey G Tube Plc W/fl W/rad  Result Date: 09/02/2018 CLINICAL DATA:  NG tube placement. EXAM: NASO G TUBE PLACEMENT WITH FL AND WITH RAD CONTRAST:  Water-soluble contrast FLUOROSCOPY TIME:  Fluoroscopy Time:  5 minutes 48 seconds Radiation Exposure Index (if provided by the fluoroscopic device): Number of Acquired Spot Images: 2 COMPARISON:  09/02/2018 FINDINGS: Under direct fluoroscopic guidance a weighted nasogastric tube was placed into the right nares. The tube was then guided into the esophagus, past the GE junction and into the gastric lumen. The tube was then manipulated into the third portion of the duodenum. A small volume of water-soluble contrast material was injected into the tube confirming placement of the tube at the level of the duodenal jejunal junction. IMPRESSION: 1. Successful fluoroscopic guided weighted nasogastric tube placement. The tip is at the level of the  duodenal jejunal junction and is ready for use. Electronically Signed   By: Kerby Moors M.D.   On: 09/02/2018 11:43      Kalman Drape , [facility] 09/04/2018, 8:25 AM  Pager: [phone] Mon-Wed, Friday 7:00am-4:30pm Thurs 7am-11:30am  Consults: [phone]

## 2018-09-04 NOTE — Progress Notes (Signed)
PROGRESS NOTE        PATIENT DETAILS Name: Luis Lyons Age: 77 y.o. Sex: male Date of Birth: [birth year] Admit Date: 08/28/2018 Admitting Physician [name], MD OVF:IEPPIR, [name], MD  Brief Narrative: 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  Upon further evaluation he was found to have pneumonia and a small bowel obstruction.  See below for further details  Subjective: Passing flatus-Per general surgery note patient just had a bowel movement this afternoon-he had not had a bowel movement when I saw him earlier this morning.  Assessment/Plan: SBO: Some radiographic improvement today-thankfully has had a bowel movement this afternoon.  Continue supportive care-General surgery following and planning on NG clamping tomorrow.   Pneumonia: Clinically improved-afebrile-has completed a course of antimicrobial therapy already.      AKI: Resolved-thought to be hemodynamically mediated.  Follow electrolytes periodically.    History of CVA with chronic left-sided weakness: Remains at baseline-resume aspirin once oral intake has been resumed.    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  Antimicrobial agents: Anti-infectives (From admission, onward)   Start     Dose/Rate Route Frequency Ordered Stop   08/28/18 2230  cefTRIAXone (ROCEPHIN) 2 g in sodium chloride 0.9 % 100 mL IVPB     2  g 200 mL/hr over 30 Minutes Intravenous Every 24 hours 08/28/18 2215 09/03/18 2232   08/28/18 2230  azithromycin (ZITHROMAX) 500 mg in sodium chloride 0.9 % 250 mL IVPB     500 mg 250 mL/hr over 60 Minutes Intravenous Every 24 hours 08/28/18 2215 09/01/18 2229      Procedures: None  CONSULTS:  general surgery  Time spent: 25- minutes-Greater than 50% of this time was spent in counseling, explanation of diagnosis, planning of further management, and coordination of care.  MEDICATIONS: Scheduled Meds: . enoxaparin (LOVENOX) injection  40 mg Subcutaneous Q24H  . insulin aspart  0-9 Units Subcutaneous Q6H  . ipratropium-albuterol  3 mL Nebulization BID  . mouth rinse  15 mL Mouth Rinse BID  . polyvinyl alcohol  1 drop Both Eyes BID   Continuous Infusions: . 0.9 % NaCl with KCl 20 mEq / L 70 mL/hr at 09/04/18 1020  . 0.9 % NaCl with KCl 20 mEq / L    . TPN ADULT (ION) 55 mL/hr at 09/04/18 0600  . TPN ADULT (ION)     PRN Meds:.acetaminophen **OR** acetaminophen, albuterol, hydrALAZINE, morphine injection, ondansetron **OR** ondansetron (ZOFRAN) IV, sodium chloride flush   PHYSICAL EXAM: Vital signs: Vitals:   09/03/18 2119 09/03/18 2127 09/04/18 0631 09/04/18 0932  BP: (!) 168/80 (!) 116/58 (!) 173/81   Pulse: 92 64 87   Resp: (!) 22 16 18    Temp: 98.5 F (36.9 C) 98.4 F (36.9 C) 98.5 F (36.9 C)   TempSrc: Oral Oral Oral   SpO2: 95% 100% 97% 97%  Weight:      Height:  Filed Weights   08/31/18 0402 09/01/18 1538 09/02/18 0500  Weight: 96 kg 94.8 kg 93.3 kg   Body mass index is 27.14 kg/m.   General appearance:Awake, alert, not in any distress.  Eyes:no scleral icterus. HEENT: Atraumatic and Normocephalic Neck: supple, no JVD. Resp:Good air entry bilaterally,no rales or rhonchi CVS: S1 S2 regular, no murmurs.  GI: Bowel sounds sluggish, Non tender and not distended with no gaurding, rigidity or rebound. Extremities: B/L Lower Ext shows no edema, both  legs are warm to touch Musculoskeletal:No digital cyanosis Skin:No Rash, warm and dry Wounds:N/A  I have personally reviewed following labs and imaging studies  LABORATORY DATA: CBC: Recent Labs  Lab 08/28/18 2142  08/30/18 0530 08/31/18 0544 09/01/18 0312 09/02/18 0435 09/03/18 0356  WBC 15.7*   < > 11.0* 9.3 9.9 11.5* 10.5  NEUTROABS 13.7*  --   --   --   --  8.7* 8.0*  HGB 12.8*   < > 12.0* 12.5* 11.2* 11.7* 10.5*  HCT 41.5   < > 37.9* 40.8 37.9* 39.0 33.7*  MCV 89.6   < > 89.6 91.1 91.5 91.1 90.6  PLT 346   < > 302 299 309 338 274   < > = values in this interval not displayed.    Basic Metabolic Panel: Recent Labs  Lab 08/31/18 0544 09/01/18 0312 09/02/18 0435 09/03/18 0356 09/04/18 0404  NA 141 143 143 140 138  K 4.2 4.0 3.6 3.8 4.0  CL 110 110 108 110 108  CO2 23 22 23 28 23   GLUCOSE 113* 98 92 114* 127*  BUN 25* 27* 25* 21 15  CREATININE 1.34* 1.15 1.21 1.11 0.91  CALCIUM 9.0 8.6* 8.6* 8.2* 8.5*  MG  --   --  2.3 2.1 2.2  PHOS  --   --  2.8 2.8 2.9    GFR: Estimated Creatinine Clearance: 78 mL/min (by C-G formula based on SCr of 0.91 mg/dL).  Liver Function Tests: Recent Labs  Lab 08/28/18 2142 08/29/18 0329 09/02/18 0435 09/03/18 0356  AST 16 19 15 15   ALT 13 13 12 14   ALKPHOS 76 71 49 45  BILITOT 0.8 0.6 0.7 0.5  PROT 7.8 7.5 7.1 6.1*  ALBUMIN 3.2* 3.1* 2.7* 2.4*   Recent Labs  Lab 08/28/18 2142  LIPASE 25   No results for input(s): AMMONIA in the last 168 hours.  Coagulation Profile: No results for input(s): INR, PROTIME in the last 168 hours.  Cardiac Enzymes: No results for input(s): CKTOTAL, CKMB, CKMBINDEX, TROPONINI in the last 168 hours.  BNP (last 3 results) No results for input(s): PROBNP in the last 8760 hours.  HbA1C: No results for input(s): HGBA1C in the last 72 hours.  CBG: Recent Labs  Lab 09/03/18 0747 09/03/18 1641 09/04/18 0014 09/04/18 0633 09/04/18 1212  GLUCAP 114* 93 123* 93 114*    Lipid  Profile: Recent Labs    09/02/18 0435 09/03/18 0356  TRIG 146 133    Thyroid Function Tests: No results for input(s): TSH, T4TOTAL, FREET4, T3FREE, THYROIDAB in the last 72 hours.  Anemia Panel: No results for input(s): VITAMINB12, FOLATE, FERRITIN, TIBC, IRON, RETICCTPCT in the last 72 hours.  Urine analysis:    Component Value Date/Time   COLORURINE YELLOW 08/30/2018 1245   APPEARANCEUR HAZY (A) 08/30/2018 1245   LABSPEC 1.027 08/30/2018 1245   PHURINE 5.0 08/30/2018 1245   GLUCOSEU NEGATIVE 08/30/2018 1245   HGBUR SMALL (A) 08/30/2018 1245   BILIRUBINUR NEGATIVE 08/30/2018 1245  KETONESUR 5 (A) 08/30/2018 1245   PROTEINUR 30 (A) 08/30/2018 1245   NITRITE POSITIVE (A) 08/30/2018 1245   LEUKOCYTESUR MODERATE (A) 08/30/2018 1245    Sepsis Labs: Lactic Acid, Venous    Component Value Date/Time   LATICACIDVEN 1.86 08/28/2018 2153    MICROBIOLOGY: Recent Results (from the past 240 hour(s))  Blood Culture (routine x 2)     Status: None   Collection Time: 08/28/18  9:50 PM  Result Value Ref Range Status   Specimen Description BLOOD LEFT FOREARM  Final   Special Requests   Final    BOTTLES DRAWN AEROBIC AND ANAEROBIC Blood Culture adequate volume   Culture   Final    NO GROWTH 5 DAYS Performed at [facility], [place] [address] [place], [place] [address]    Report Status 09/02/2018 FINAL  Final  Blood Culture (routine x 2)     Status: None   Collection Time: 08/28/18 10:00 PM  Result Value Ref Range Status   Specimen Description BLOOD RIGHT HAND  Final   Special Requests   Final    BOTTLES DRAWN AEROBIC AND ANAEROBIC Blood Culture adequate volume   Culture   Final    NO GROWTH 5 DAYS Performed at [facility], [place] [address], [place], [place] [address]    Report Status 09/02/2018 FINAL  Final  MRSA PCR Screening     Status: None   Collection Time: 08/29/18  2:00 AM  Result Value Ref Range Status   MRSA by PCR NEGATIVE NEGATIVE Final    Comment:         The GeneXpert MRSA Assay (FDA approved for NASAL specimens only), is one component of a comprehensive MRSA colonization surveillance program. It is not intended to diagnose MRSA infection nor to guide or monitor treatment for MRSA infections. Performed at [facility], [place] [address], [place], [place] [address]     RADIOLOGY STUDIES/RESULTS: Dg Chest 1 View  Result Date: 09/02/2018 CLINICAL DATA:  Wheezing. EXAM: CHEST  1 VIEW COMPARISON:  09/02/2018 FINDINGS: Right arm PICC line tip is in the cavoatrial junction. There is a feeding tube in place. Heart size normal. No pleural effusion or edema. No airspace opacities identified. IMPRESSION: No acute cardiopulmonary abnormalities. Electronically Signed   By: Kerby Moors M.D.   On: 09/02/2018 15:46   Dg Abd 1 View  Result Date: 09/03/2018 CLINICAL DATA:  Small-bowel obstruction.  NG tube. EXAM: ABDOMEN - 1 VIEW COMPARISON:  Abdominal radiographic series-09/02/2018; 09/01/2018; 08/29/2018; CT abdomen pelvis-11/26/fluoroscopic guided nasogastric tube placement-09/02/2018 FINDINGS: There is been apparent exchange of existing weighted tip feeding tube with new enteric tube tip overlying the expected location of the descending portion of the duodenum. Re demonstrated moderate gaseous distension of several rather patulous loops of small bowel, unchanged to slightly improved compared to the 09/02/2018 examination with index loop of small bowel in the right mid/lower abdomen measuring 5.6 cm in diameter. Nondiagnostic evaluation for pneumoperitoneum secondary to supine positioning exclusion of the lower thorax. Stigmata of DISH throughout the thoracic spine. Post ORIF of the left femur and femoral neck. IMPRESSION: 1. No change to slight improvement in findings again concerning for small bowel obstruction. 2. Apparent change of enteric tube with new enteric tube tip terminating over the expected location of the descending portion duodenum.  Electronically Signed   By: Sandi Mariscal M.D.   On: 09/03/2018 07:46   Dg Abd 1 View  Result Date: 09/01/2018 CLINICAL DATA:  SBO,  upright image requested by MD. Jasmine December: ABDOMEN - 1 VIEW COMPARISON:  08/30/2018 FINDINGS: Nasogastric tube has been advanced into the proximal duodenum. The stomach appears decompressed. There are multiple gas distended small bowel loops identified in the upper abdomen. The lower abdomen is excluded. Visualized portions of colon are nondilated. There is no convincing free air. Visualized lung bases unremarkable. IMPRESSION: 1. Nasogastric tube advancement into the proximal duodenum, with persistent small bowel distention. Electronically Signed   By: Lucrezia Europe M.D.   On: 09/01/2018 07:57   Dg Abd 1 View  Result Date: 08/30/2018 CLINICAL DATA:  Small bowel obstruction EXAM: ABDOMEN - 1 VIEW COMPARISON:  August 29, 2018 FINDINGS: The NG tube terminates in the proximal stomach. Small bowel dilatation is less prominent on this study but remains. No other acute abnormalities. IMPRESSION: The small bowel dilatation is less prominent but remains consistent with persistent small-bowel obstruction. The enteric tube appears to terminate in the proximal stomach. Electronically Signed   By: Dorise Bullion III M.D   On: 08/30/2018 13:22   Dg Abd 1 View  Result Date: 08/29/2018 CLINICAL DATA:  Replacement of NG tube. EXAM: ABDOMEN - 1 VIEW COMPARISON:  One-view abdomen 08/29/2018 at 8:46 a.m. FINDINGS: Side port of the NG tube is beyond the GE junction. Mildly dilated loops of small bowel are similar the prior exam. No free air is present. IMPRESSION: 1. Side port of the NG tube is beyond the GE junction. 2. Similar appearance of multiple dilated loops of small bowel without evidence of free air. Electronically Signed   By: San Morelle M.D.   On: 08/29/2018 13:08   Dg Abd 1 View  Result Date: 08/29/2018 CLINICAL DATA:  Check nasogastric catheter placement EXAM: ABDOMEN - 1  VIEW COMPARISON:  None. FINDINGS: 30 seconds of fluoroscopy was utilized for placement. The nasogastric catheter is shown coiled within the gastric lumen. IMPRESSION: Nasogastric catheter placed in the stomach. Electronically Signed   By: Inez Catalina M.D.   On: 08/29/2018 09:08   Ct Abdomen Pelvis W Contrast  Result Date: 08/28/2018 CLINICAL DATA:  Abdominal pain with shortness of breath EXAM: CT ABDOMEN AND PELVIS WITH CONTRAST TECHNIQUE: Multidetector CT imaging of the abdomen and pelvis was performed using the standard protocol following bolus administration of intravenous contrast. CONTRAST:  116mL OMNIPAQUE IOHEXOL 300 MG/ML  SOLN COMPARISON:  None. FINDINGS: LOWER CHEST: There is no basilar pleural or apical pericardial effusion. HEPATOBILIARY: The hepatic contours and density are normal. There is no intra- or extrahepatic biliary dilatation. There is cholelithiasis without acute inflammation. PANCREAS: The pancreatic parenchymal contours are normal and there is no ductal dilatation. There is no peripancreatic fluid collection. SPLEEN: Normal. ADRENALS/URINARY TRACT: --Adrenal glands: Normal. --Right kidney/ureter: Mild atrophy. No hydronephrosis. --Left kidney/ureter: Vascular calcifications and interpolar nonobstructive 3 mm nephrolithiasis. No hydronephrosis. --Urinary bladder: Mild bladder wall thickening, possibly due to underdistention. STOMACH/BOWEL: --Stomach/Duodenum: There is no hiatal hernia or other gastric abnormality. The duodenal course and caliber are normal. --Small bowel: There are multiple loops of dilated small bowel in the central abdomen, predominantly anteriorly. One of these loops is directly beneath the skin surface, extending through a diastatic defect of the anterior abdominal muscles. Suspected transition point is in the anterior low midline abdomen (coronal image 24 and sagittal image 70). --Colon: No focal abnormality. --Appendix: Not visualized. No right lower quadrant  inflammation or free fluid. VASCULAR/LYMPHATIC: Atherosclerotic calcification is present within the non-aneurysmal abdominal aorta, without hemodynamically significant stenosis. The portal vein, splenic vein, superior  mesenteric vein and IVC are patent. No abdominal or pelvic lymphadenopathy. REPRODUCTIVE: Normal prostate size with symmetric seminal vesicles. MUSCULOSKELETAL. Compression deformity of L2 is probably chronic. There is grade 1 L4-5 anterolisthesis secondary to facet arthrosis. OTHER: None. IMPRESSION: 1. Small bowel obstruction with suspected transition point in the low anterior midline abdomen. 2. Cholelithiasis without acute inflammation. 3. Nonobstructing left nephrolithiasis. Aortic Atherosclerosis (ICD10-I70.0). Electronically Signed   By: Ulyses Jarred M.D.   On: 08/28/2018 23:21   Dg Chest Port 1 View  Result Date: 08/28/2018 CLINICAL DATA:  77 y/o  M; abdominal pain and shortness of breath. EXAM: PORTABLE CHEST 1 VIEW COMPARISON:  None. FINDINGS: Normal cardiac silhouette. Aortic atherosclerosis with calcification. Perihilar and basilar reticular opacities. No consolidation. No pleural effusion or pneumothorax. No acute osseous abnormality is evident. IMPRESSION: Basilar reticular opacities may represent mild interstitial edema or atypical pneumonia. No consolidation. Electronically Signed   By: Kristine Garbe M.D.   On: 08/28/2018 21:55   Dg Abd Portable 1v  Result Date: 09/04/2018 CLINICAL DATA:  Follow up small bowel obstruction EXAM: PORTABLE ABDOMEN - 1 VIEW COMPARISON:  09/03/2018 FINDINGS: Nasogastric catheter is noted in the second portion of the duodenum stable from the prior exam. Scattered large and small bowel gas is noted. There is been a slight decrease in the degree of small bowel dilatation when compared with the previous exam. No free air is noted. Postsurgical changes in the left hip are again seen. IMPRESSION: Improvement in the degree of small bowel  dilatation when compared with the previous day. Continued follow-up is recommended. Electronically Signed   By: Inez Catalina M.D.   On: 09/04/2018 08:46   Dg Abd Portable 1v  Result Date: 08/29/2018 CLINICAL DATA:  Small bowel obstruction, 8 hour delayed film after Gastrografin EXAM: PORTABLE ABDOMEN - 1 VIEW COMPARISON:  Abdominal radiograph from earlier today FINDINGS: Enteric tube terminates in the proximal stomach. Moderately dilated small bowel loops throughout the abdomen are not appreciably changed. Oral contrast transits to the mid small bowel. No definite oral contrast in the colon. Moderate colonic stool. No evidence of pneumatosis or pneumoperitoneum. Clear lung bases. IMPRESSION: 1. Enteric tube terminates in the stomach. 2. Stable moderately dilated small bowel loops compatible with small-bowel obstruction. Oral contrast transits to the mid small bowel, with no definite oral contrast in the colon. Electronically Signed   By: Ilona Sorrel M.D.   On: 08/29/2018 21:38   Dg Abd Portable 1v-small Bowel Obstruction Protocol-initial, 8 Hr Delay  Result Date: 08/29/2018 CLINICAL DATA:  Follow up small bowel obstruction EXAM: PORTABLE ABDOMEN - 1 VIEW COMPARISON:  08/28/2018 FINDINGS: Nasogastric catheter is now noted within the stomach. Fecal material is noted throughout the colon. Contrast is seen within the bladder from recent CT examination. Multiple dilated small bowel loops are again identified in the mid abdomen stable from the recent exam. No acute bony abnormality is noted. Prior L2 compression deformity is noted. IMPRESSION: Stable small bowel dilatation following nasogastric catheter placement Electronically Signed   By: Inez Catalina M.D.   On: 08/29/2018 09:09   Dg Abd Portable 2v  Result Date: 09/02/2018 CLINICAL DATA:  Initial evaluation for possible foreign body, missing piece of NG tube. EXAM: PORTABLE ABDOMEN - 2 VIEW COMPARISON:  Prior radiograph from 09/01/2018. FINDINGS:  Previously seen enteric tube has been removed. No visible tube fragment or other radiopaque foreign body identified. Persistent small bowel dilatation noted within the upper abdomen, suggestive of ongoing small bowel obstruction. This appears improved  from previous. Partially visualized lung bases are grossly clear. IMPRESSION: 1. No visible tube fragment or other radiopaque foreign body identified. 2. Persistent but improved small bowel obstruction. Electronically Signed   By: Jeannine Boga M.D.   On: 09/02/2018 05:52   Dg Addison Bailey G Tube Plc W/fl W/rad  Result Date: 09/03/2018 CLINICAL DATA:  Were acquires nasogastric tube placement. EXAM: NASO G TUBE PLACEMENT WITH FL AND WITH RAD CONTRAST:  Water-soluble contrast FLUOROSCOPY TIME:  Fluoroscopy Time:  1 minutes 12 seconds Radiation Exposure Index (if provided by the fluoroscopic device): Number of Acquired Spot Images: 2 COMPARISON:  Earlier today FINDINGS: Patient's indwelling weighted feeding tube was removed. A nasogastric tube was then placed through the patient's right nares and under direct fluoroscopic guidance was manipulated into the stomach. The tube was in extended into the duodenum. A small volume of water-soluble contrast material was injected into the nasogastric tube in the tip terminates at the distal portion of the descending duodenum. IMPRESSION: Removal of indwelling weighted feeding tube with subsequent fluoroscopic guided placement of nasogastric tube into the proximal duodenum. Contrast injected into the nasogastric tube confirms tip location at the distal portion of the descending duodenum. Electronically Signed   By: Kerby Moors M.D.   On: 09/03/2018 12:51   Dg Addison Bailey G Tube Plc W/fl W/rad  Result Date: 09/02/2018 CLINICAL DATA:  NG tube placement. EXAM: NASO G TUBE PLACEMENT WITH FL AND WITH RAD CONTRAST:  Water-soluble contrast FLUOROSCOPY TIME:  Fluoroscopy Time:  5 minutes 48 seconds Radiation Exposure Index (if provided by  the fluoroscopic device): Number of Acquired Spot Images: 2 COMPARISON:  09/02/2018 FINDINGS: Under direct fluoroscopic guidance a weighted nasogastric tube was placed into the right nares. The tube was then guided into the esophagus, past the GE junction and into the gastric lumen. The tube was then manipulated into the third portion of the duodenum. A small volume of water-soluble contrast material was injected into the tube confirming placement of the tube at the level of the duodenal jejunal junction. IMPRESSION: 1. Successful fluoroscopic guided weighted nasogastric tube placement. The tip is at the level of the duodenal jejunal junction and is ready for use. Electronically Signed   By: Kerby Moors M.D.   On: 09/02/2018 11:43   [place] Ekg Site Rite  Result Date: 09/01/2018 If Site Rite image not attached, placement could not be confirmed due to current cardiac rhythm.    LOS: 7 days   Oren Binet, MD  [facility]  If 7PM-7AM, please contact night-coverage  Please page via [email or web address]-Password [email or web address]-click on MD name and type text message  09/04/2018, 2:01 PM

## 2018-09-05 ENCOUNTER — Encounter (HOSPITAL_COMMUNITY): Payer: Self-pay | Admitting: Certified Registered"

## 2018-09-05 ENCOUNTER — Inpatient Hospital Stay (HOSPITAL_COMMUNITY): Payer: Medicare Other

## 2018-09-05 ENCOUNTER — Encounter (HOSPITAL_COMMUNITY)
Admission: EM | Disposition: A | Discharge status: Skilled Nursing Facility | Payer: Self-pay | Source: Home / Self Care | Attending: Internal Medicine

## 2018-09-05 ENCOUNTER — Inpatient Hospital Stay (HOSPITAL_COMMUNITY): Payer: Medicare Other | Admitting: Certified Registered"

## 2018-09-05 HISTORY — PX: LAPAROTOMY: SHX154

## 2018-09-05 LAB — GLUCOSE, CAPILLARY
GLUCOSE-CAPILLARY: 121 mg/dL — AB (ref 70–99)
GLUCOSE-CAPILLARY: 127 mg/dL — AB (ref 70–99)
Glucose-Capillary: 113 mg/dL — ABNORMAL HIGH (ref 70–99)
Glucose-Capillary: 135 mg/dL — ABNORMAL HIGH (ref 70–99)

## 2018-09-05 LAB — BASIC METABOLIC PANEL
Anion gap: 8 (ref 5–15)
BUN: 19 mg/dL (ref 8–23)
CO2: 23 mmol/L (ref 22–32)
Calcium: 9 mg/dL (ref 8.9–10.3)
Chloride: 106 mmol/L (ref 98–111)
Creatinine, Ser: 1.1 mg/dL (ref 0.61–1.24)
GFR calc Af Amer: >60 mL/min (ref >60)
GFR calc non Af Amer: >60 mL/min (ref >60)
Glucose, Bld: 119 mg/dL — ABNORMAL HIGH (ref 70–99)
Potassium: 4.6 mmol/L (ref 3.5–5.1)
SODIUM: 137 mmol/L (ref 135–145)

## 2018-09-05 LAB — SURGICAL PCR SCREEN
MRSA, PCR: NEGATIVE
Staphylococcus aureus: NEGATIVE

## 2018-09-05 SURGERY — LAPAROTOMY, EXPLORATORY
Anesthesia: General | Site: Abdomen

## 2018-09-05 MED ORDER — 0.9 % SODIUM CHLORIDE (POUR BTL) OPTIME
TOPICAL | Status: DC | PRN
  Administered 2018-09-05: 2000 mL

## 2018-09-05 MED ORDER — HYDROMORPHONE HCL 1 MG/ML IJ SOLN
INTRAMUSCULAR | Status: AC
  Administered 2018-09-07: 1 mg via INTRAVENOUS
  Filled 2018-09-05: qty 1

## 2018-09-05 MED ORDER — LACTATED RINGERS IV SOLN
INTRAVENOUS | Status: DC
  Administered 2018-09-05: 11:00:00 via INTRAVENOUS

## 2018-09-05 MED ORDER — SODIUM CHLORIDE 0.9 % IV SOLN
INTRAVENOUS | Status: DC
  Administered 2018-09-05: 21:00:00 via INTRAVENOUS

## 2018-09-05 MED ORDER — LABETALOL HCL 5 MG/ML IV SOLN
10.0000 mg | INTRAVENOUS | Status: AC | PRN
  Administered 2018-09-05 (×2): 10 mg via INTRAVENOUS

## 2018-09-05 MED ORDER — TRAVASOL 10 % IV SOLN
INTRAVENOUS | Status: AC
  Administered 2018-09-05: 19:00:00 via INTRAVENOUS
  Filled 2018-09-05: qty 1203.6

## 2018-09-05 MED ORDER — FENTANYL CITRATE (PF) 100 MCG/2ML IJ SOLN
INTRAMUSCULAR | Status: DC | PRN
  Administered 2018-09-05: 50 ug via INTRAVENOUS
  Administered 2018-09-05: 100 ug via INTRAVENOUS
  Administered 2018-09-05 (×2): 50 ug via INTRAVENOUS

## 2018-09-05 MED ORDER — LABETALOL HCL 5 MG/ML IV SOLN
INTRAVENOUS | Status: AC
  Filled 2018-09-05: qty 4

## 2018-09-05 MED ORDER — LABETALOL HCL 5 MG/ML IV SOLN: 10.0000 mg | INTRAVENOUS | Status: DC | PRN

## 2018-09-05 MED ORDER — LIDOCAINE 2% (20 MG/ML) 5 ML SYRINGE
INTRAMUSCULAR | Status: AC
  Filled 2018-09-05: qty 5

## 2018-09-05 MED ORDER — DEXAMETHASONE SODIUM PHOSPHATE 10 MG/ML IJ SOLN
INTRAMUSCULAR | Status: DC | PRN
  Administered 2018-09-05: 5 mg via INTRAVENOUS

## 2018-09-05 MED ORDER — LIDOCAINE 2% (20 MG/ML) 5 ML SYRINGE
INTRAMUSCULAR | Status: DC | PRN
  Administered 2018-09-05: 80 mg via INTRAVENOUS

## 2018-09-05 MED ORDER — ROCURONIUM BROMIDE 10 MG/ML (PF) SYRINGE
PREFILLED_SYRINGE | INTRAVENOUS | Status: DC | PRN
  Administered 2018-09-05: 10 mg via INTRAVENOUS
  Administered 2018-09-05: 20 mg via INTRAVENOUS

## 2018-09-05 MED ORDER — MIDAZOLAM HCL 5 MG/5ML IJ SOLN
INTRAMUSCULAR | Status: DC | PRN
  Administered 2018-09-05: 1 mg via INTRAVENOUS

## 2018-09-05 MED ORDER — ACETAMINOPHEN 10 MG/ML IV SOLN
INTRAVENOUS | Status: AC
  Filled 2018-09-05: qty 100

## 2018-09-05 MED ORDER — ACETAMINOPHEN 10 MG/ML IV SOLN
1000.0000 mg | Freq: Four times a day (QID) | INTRAVENOUS | Status: AC
  Administered 2018-09-05 – 2018-09-06 (×3): 1000 mg via INTRAVENOUS
  Filled 2018-09-05 (×3): qty 100

## 2018-09-05 MED ORDER — ACETAMINOPHEN 10 MG/ML IV SOLN
1000.0000 mg | Freq: Four times a day (QID) | INTRAVENOUS | Status: DC
  Administered 2018-09-05: 1000 mg via INTRAVENOUS
  Filled 2018-09-05: qty 100

## 2018-09-05 MED ORDER — DEXTROSE 10 % IV SOLN: INTRAVENOUS | Status: AC

## 2018-09-05 MED ORDER — PROPOFOL 10 MG/ML IV BOLUS
INTRAVENOUS | Status: DC | PRN
  Administered 2018-09-05: 140 mg via INTRAVENOUS

## 2018-09-05 MED ORDER — CEFAZOLIN SODIUM-DEXTROSE 2-4 GM/100ML-% IV SOLN
2.0000 g | Freq: Once | INTRAVENOUS | Status: AC
  Administered 2018-09-05: 2 g via INTRAVENOUS
  Filled 2018-09-05: qty 100

## 2018-09-05 MED ORDER — PROPOFOL 10 MG/ML IV BOLUS
INTRAVENOUS | Status: AC
  Filled 2018-09-05: qty 20

## 2018-09-05 MED ORDER — FLEET ENEMA 7-19 GM/118ML RE ENEM: 1.0000 | ENEMA | Freq: Once | RECTAL | Status: DC

## 2018-09-05 MED ORDER — HYDROMORPHONE HCL 1 MG/ML IJ SOLN
0.5000 mg | INTRAMUSCULAR | Status: DC | PRN
  Administered 2018-09-05: 1 mg via INTRAVENOUS
  Administered 2018-09-05 (×2): 0.5 mg via INTRAVENOUS
  Administered 2018-09-06: 1 mg via INTRAVENOUS
  Administered 2018-09-06 – 2018-09-07 (×4): 0.5 mg via INTRAVENOUS
  Administered 2018-09-07: 1 mg via INTRAVENOUS
  Administered 2018-09-08 – 2018-09-09 (×3): 0.5 mg via INTRAVENOUS
  Filled 2018-09-05 (×2): qty 0.5
  Filled 2018-09-05: qty 1
  Filled 2018-09-05 (×2): qty 0.5
  Filled 2018-09-05 (×3): qty 1
  Filled 2018-09-05 (×2): qty 0.5
  Filled 2018-09-05: qty 1

## 2018-09-05 MED ORDER — FENTANYL CITRATE (PF) 250 MCG/5ML IJ SOLN
INTRAMUSCULAR | Status: AC
  Filled 2018-09-05: qty 5

## 2018-09-05 MED ORDER — ONDANSETRON HCL 4 MG/2ML IJ SOLN
INTRAMUSCULAR | Status: AC
  Filled 2018-09-05: qty 2

## 2018-09-05 MED ORDER — HYDROMORPHONE HCL 1 MG/ML IJ SOLN
INTRAMUSCULAR | Status: AC
  Filled 2018-09-05: qty 1

## 2018-09-05 MED ORDER — SUCCINYLCHOLINE CHLORIDE 20 MG/ML IJ SOLN
INTRAMUSCULAR | Status: DC | PRN
  Administered 2018-09-05: 110 mg via INTRAVENOUS

## 2018-09-05 MED ORDER — SUGAMMADEX SODIUM 200 MG/2ML IV SOLN
INTRAVENOUS | Status: DC | PRN
  Administered 2018-09-05: 200 mg via INTRAVENOUS

## 2018-09-05 MED ORDER — MIDAZOLAM HCL 2 MG/2ML IJ SOLN
INTRAMUSCULAR | Status: AC
  Filled 2018-09-05: qty 2

## 2018-09-05 MED ORDER — DEXAMETHASONE SODIUM PHOSPHATE 10 MG/ML IJ SOLN
INTRAMUSCULAR | Status: AC
  Filled 2018-09-05: qty 1

## 2018-09-05 MED ORDER — CEFAZOLIN SODIUM-DEXTROSE 2-4 GM/100ML-% IV SOLN
INTRAVENOUS | Status: AC
  Filled 2018-09-05: qty 100

## 2018-09-05 MED ORDER — PROMETHAZINE HCL 25 MG/ML IJ SOLN: 6.2500 mg | INTRAMUSCULAR | Status: DC | PRN

## 2018-09-05 MED ORDER — HYDROMORPHONE HCL 1 MG/ML IJ SOLN
0.2500 mg | INTRAMUSCULAR | Status: DC | PRN
  Administered 2018-09-05 (×5): 0.5 mg via INTRAVENOUS

## 2018-09-05 MED ORDER — ROCURONIUM BROMIDE 50 MG/5ML IV SOSY
PREFILLED_SYRINGE | INTRAVENOUS | Status: AC
  Filled 2018-09-05: qty 5

## 2018-09-05 SURGICAL SUPPLY — 43 items
BLADE CLIPPER SURG (BLADE) ×4 IMPLANT
CANISTER SUCT 3000ML PPV (MISCELLANEOUS) ×4 IMPLANT
CHLORAPREP W/TINT 26ML (MISCELLANEOUS) ×4 IMPLANT
COVER SURGICAL LIGHT HANDLE (MISCELLANEOUS) ×4 IMPLANT
COVER WAND RF STERILE (DRAPES) IMPLANT
DRAPE LAPAROSCOPIC ABDOMINAL (DRAPES) ×4 IMPLANT
DRAPE WARM FLUID 44X44 (DRAPE) ×4 IMPLANT
DRSG OPSITE POSTOP 4X10 (GAUZE/BANDAGES/DRESSINGS) ×4 IMPLANT
DRSG OPSITE POSTOP 4X8 (GAUZE/BANDAGES/DRESSINGS) IMPLANT
ELECT BLADE 6.5 EXT (BLADE) ×4 IMPLANT
ELECT CAUTERY BLADE 6.4 (BLADE) ×4 IMPLANT
ELECT REM PT RETURN 9FT ADLT (ELECTROSURGICAL) ×4
ELECTRODE REM PT RTRN 9FT ADLT (ELECTROSURGICAL) ×2 IMPLANT
GLOVE BIO SURGEON STRL SZ 6 (GLOVE) ×8 IMPLANT
GLOVE BIO SURGEON STRL SZ 6.5 (GLOVE) ×12 IMPLANT
GLOVE BIO SURGEON STRL SZ8 (GLOVE) ×8 IMPLANT
GLOVE BIO SURGEONS STRL SZ 6.5 (GLOVE) ×4
GLOVE BIOGEL PI IND STRL 6.5 (GLOVE) ×12 IMPLANT
GLOVE BIOGEL PI IND STRL 8 (GLOVE) ×2 IMPLANT
GLOVE BIOGEL PI INDICATOR 6.5 (GLOVE) ×12
GLOVE BIOGEL PI INDICATOR 8 (GLOVE) ×2
GOWN STRL REUS W/ TWL LRG LVL3 (GOWN DISPOSABLE) ×6 IMPLANT
GOWN STRL REUS W/ TWL XL LVL3 (GOWN DISPOSABLE) ×2 IMPLANT
GOWN STRL REUS W/TWL LRG LVL3 (GOWN DISPOSABLE) ×6
GOWN STRL REUS W/TWL XL LVL3 (GOWN DISPOSABLE) ×2
KIT BASIN OR (CUSTOM PROCEDURE TRAY) ×4 IMPLANT
KIT TURNOVER KIT B (KITS) ×4 IMPLANT
LIGASURE IMPACT 36 18CM CVD LR (INSTRUMENTS) IMPLANT
NS IRRIG 1000ML POUR BTL (IV SOLUTION) ×8 IMPLANT
PACK GENERAL/GYN (CUSTOM PROCEDURE TRAY) ×4 IMPLANT
PAD ARMBOARD 7.5X6 YLW CONV (MISCELLANEOUS) ×4 IMPLANT
SPECIMEN JAR LARGE (MISCELLANEOUS) IMPLANT
SPONGE LAP 18X18 X RAY DECT (DISPOSABLE) IMPLANT
STAPLER VISISTAT 35W (STAPLE) ×4 IMPLANT
SUCTION POOLE TIP (SUCTIONS) ×4 IMPLANT
SUT PDS AB 1 TP1 96 (SUTURE) ×8 IMPLANT
SUT SILK 2 0 SH CR/8 (SUTURE) ×4 IMPLANT
SUT SILK 2 0 TIES 10X30 (SUTURE) ×4 IMPLANT
SUT SILK 3 0 SH CR/8 (SUTURE) ×4 IMPLANT
SUT SILK 3 0 TIES 10X30 (SUTURE) ×4 IMPLANT
TOWEL OR 17X26 10 PK STRL BLUE (TOWEL DISPOSABLE) ×4 IMPLANT
TRAY FOLEY MTR SLVR 16FR STAT (SET/KITS/TRAYS/PACK) ×4 IMPLANT
YANKAUER SUCT BULB TIP NO VENT (SUCTIONS) ×4 IMPLANT

## 2018-09-05 NOTE — Progress Notes (Signed)
Patient removed his NG tube.  Two attempts were made to replace NG tube.  Unsuccessful.  Reached out to rapid  Response team to assist with the placement.  Rapid response nurse wanted to verify with K. Schorr, NP whether its okay to make another attempt due to patient being on blood thinner.  Lamar Blinks, NP stated to reach out to general surgery.  Dr. Donne Hazel stated its okay to leave the NG tube out unless the patient becomes nauseated or develop extreme discomfort.  Will continue to monitor the patient and notify as needed.

## 2018-09-05 NOTE — Anesthesia Preprocedure Evaluation (Signed)
Anesthesia Evaluation  Patient identified by MRN, date of birth, ID band Patient awake    Reviewed: Allergy & Precautions, NPO status , Patient's Chart, lab work & pertinent test results  Airway Mallampati: II  TM Distance: <3 FB Neck ROM: Full   Comment: Previous trach due to mandible surgery, S/P radiation. I feel he can be intubated orally, will have videoscope in room for back-up plan Dental  (+) Edentulous Upper, Dental Advisory Given   Pulmonary former smoker,    Pulmonary exam normal        Cardiovascular hypertension, Pt. on medications Normal cardiovascular exam     Neuro/Psych PSYCHIATRIC DISORDERS Depression CVA    GI/Hepatic Neg liver ROS,   Endo/Other  negative endocrine ROS  Renal/GU negative Renal ROS  negative genitourinary   Musculoskeletal negative musculoskeletal ROS (+)   Abdominal   Peds negative pediatric ROS (+)  Hematology negative hematology ROS (+)   Anesthesia Other Findings Previous trach due to mandible surgery, S/P radiation. I   Reproductive/Obstetrics negative OB ROS                             Anesthesia Physical Anesthesia Plan  ASA: III  Anesthesia Plan: General   Post-op Pain Management:    Induction: Intravenous, Rapid sequence and Cricoid pressure planned  PONV Risk Score and Plan: 2 and Ondansetron and Dexamethasone  Airway Management Planned: Oral ETT  Additional Equipment:   Intra-op Plan:   Post-operative Plan: Possible Post-op intubation/ventilation  Informed Consent: I have reviewed the patients History and Physical, chart, labs and discussed the procedure including the risks, benefits and alternatives for the proposed anesthesia with the patient or authorized representative who has indicated his/her understanding and acceptance.   Dental advisory given  Plan Discussed with: Anesthesiologist  Anesthesia Plan Comments:          Anesthesia Quick Evaluation

## 2018-09-05 NOTE — Anesthesia Postprocedure Evaluation (Signed)
Anesthesia Post Note  Patient: Luis Lyons  Procedure(s) Performed: EXPLORATORY LAPAROTOMY (N/A Abdomen)     Patient location during evaluation: PACU Anesthesia Type: General Level of consciousness: sedated Pain management: pain level controlled Vital Signs Assessment: post-procedure vital signs reviewed and stable Respiratory status: spontaneous breathing and respiratory function stable Cardiovascular status: stable Postop Assessment: no apparent nausea or vomiting Anesthetic complications: no    Last Vitals:  Vitals:   09/05/18 1315 09/05/18 1330  BP: (!) 163/67 (!) 150/88  Pulse: 73 70  Resp: 20 (!) 22  Temp:    SpO2: 93% 94%    Last Pain:  Vitals:   09/05/18 0700  TempSrc:   PainSc: 0-No pain                 Vikrant Pryce DANIEL

## 2018-09-05 NOTE — Anesthesia Procedure Notes (Signed)
Procedure Name: Intubation Date/Time: 09/05/2018 11:23 AM Performed by: Moshe Salisbury, CRNA Pre-anesthesia Checklist: Patient identified, Emergency Drugs available, Suction available and Patient being monitored Patient Re-evaluated:Patient Re-evaluated prior to induction Oxygen Delivery Method: Circle System Utilized Preoxygenation: Pre-oxygenation with 100% oxygen Induction Type: IV induction, Rapid sequence and Cricoid Pressure applied Laryngoscope Size: Mac and 4 Grade View: Grade III Tube type: Oral Tube size: 7.5 mm Number of attempts: 1 Airway Equipment and Method: Stylet Placement Confirmation: ETT inserted through vocal cords under direct vision,  positive ETCO2 and breath sounds checked- equal and bilateral Secured at: 22 cm Tube secured with: Tape Dental Injury: Teeth and Oropharynx as per pre-operative assessment

## 2018-09-05 NOTE — Progress Notes (Addendum)
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 ARF -improved,Creatininenormalized Likely aspirationPNA- possibly aspirated again overnight, CXR pending  pSBO - CT 11/26 w/ transition zone in lower midline - NG tube placed in fluorox3, NGT pulled out again on 11/30, and on 12/03 - SB protocol,persistently dilated loops of small bowel seen on AXR's but today improved - NG outputdown prior to becoming dislodged  FEN - NPO, TNA11/30>>prealbumin 11 ID - Azithromycin 11/26 >>, Rocephin 11/26 >> ; afebrile this AM, WBC9.9 VTE - SCD's, Lovenox  Plan:we will follow, AXR this am looks improved, read still pending,NGT will need to be replaced if pt vomits. Fleet enema  Addendum: nurse just called and stated pt vomited. Will place order for NGT replacement. Pt will likely go to OR today or tomorrow.    LOS: 8 days    Subjective: CC: SBO  Pt states he is nauseated. No vomiting since NGT got dislodged. He denies flatus or additional BM since enema yesterday. No family at bedside.   Objective: Vital signs in last 24 hours: Temp:  [97.8 F (36.6 C)-98.7 F (37.1 C)] 97.9 F (36.6 C) (12/04 0538) Pulse Rate:  [75-86] 75 (12/04 0538) Resp:  [20-24] 24 (12/04 0538) BP: (147-167)/(67-80) 147/67 (12/04 0538) SpO2:  [96 %-100 %] 100 % (12/04 0538) Weight:  [93.2 kg] 93.2 kg (12/04 0500) Last BM Date: 09/04/18  Intake/Output from previous day: 12/03 0701 - 12/04 0700 In: 2089 [I.V.:1089; NG/GT:1000] Out: 1800 [Urine:900; Emesis/NG output:900] Intake/Output this shift: No intake/output data recorded.  PE: Gen: Alert, NAD, pleasant, cooperative HEENT:mandibular deformity from previous surgery, NGT not in place Abd: Soft,mild distention, +BS,large reducible ventral hernia, mild TTP of right hemiabdomin and hernia, no peritonitis Skin: no rashes noted, warm  and dry   Anti-infectives: Anti-infectives (From admission, onward)   Start     Dose/Rate Route Frequency Ordered Stop   08/28/18 2230  cefTRIAXone (ROCEPHIN) 2 g in sodium chloride 0.9 % 100 mL IVPB     2 g 200 mL/hr over 30 Minutes Intravenous Every 24 hours 08/28/18 2215 09/03/18 2232   08/28/18 2230  azithromycin (ZITHROMAX) 500 mg in sodium chloride 0.9 % 250 mL IVPB     500 mg 250 mL/hr over 60 Minutes Intravenous Every 24 hours 08/28/18 2215 09/01/18 2229      Lab Results:  Recent Labs    09/03/18 0356  WBC 10.5  HGB 10.5*  HCT 33.7*  PLT 274   BMET Recent Labs    09/04/18 0404 09/05/18 0405  NA 138 137  K 4.0 4.6  CL 108 106  CO2 23 23  GLUCOSE 127* 119*  BUN 15 19  CREATININE 0.91 1.10  CALCIUM 8.5* 9.0   PT/INR No results for input(s): LABPROT, INR in the last 72 hours. CMP     Component Value Date/Time   NA 137 09/05/2018 0405   K 4.6 09/05/2018 0405   CL 106 09/05/2018 0405   CO2 23 09/05/2018 0405   GLUCOSE 119 (H) 09/05/2018 0405   BUN 19 09/05/2018 0405   CREATININE 1.10 09/05/2018 0405   CALCIUM 9.0 09/05/2018 0405   PROT 6.1 (L) 09/03/2018 0356   ALBUMIN 2.4 (L) 09/03/2018 0356   AST 15 09/03/2018 0356   ALT 14 09/03/2018 0356   ALKPHOS 45 09/03/2018 0356   BILITOT 0.5 09/03/2018 0356   GFRNONAA >60 09/05/2018 0405   GFRAA >  60 09/05/2018 0405   Lipase     Component Value Date/Time   LIPASE 25 08/28/2018 2142    Studies/Results: Dg Abd Portable 1v  Result Date: 09/04/2018 CLINICAL DATA:  Follow up small bowel obstruction EXAM: PORTABLE ABDOMEN - 1 VIEW COMPARISON:  09/03/2018 FINDINGS: Nasogastric catheter is noted in the second portion of the duodenum stable from the prior exam. Scattered large and small bowel gas is noted. There is been a slight decrease in the degree of small bowel dilatation when compared with the previous exam. No free air is noted. Postsurgical changes in the left hip are again seen. IMPRESSION: Improvement in  the degree of small bowel dilatation when compared with the previous day. Continued follow-up is recommended. Electronically Signed   By: Inez Catalina M.D.   On: 09/04/2018 08:46      Kalman Drape , [facility] 09/05/2018, 8:30 AM  Pager: [phone] Mon-Wed, Friday 7:00am-4:30pm Thurs 7am-11:30am  Consults: [phone]

## 2018-09-05 NOTE — Progress Notes (Signed)
PHARMACY - ADULT TOTAL PARENTERAL NUTRITION CONSULT NOTE   Pharmacy Consult:  TPN Indication:  SBO  Patient Measurements: Height: 6\' 1"  (185.4 cm) Weight: 205 lb 7.5 oz (93.2 kg) IBW/kg (Calculated) : 79.9 TPN AdjBW (KG): 90.7 Body mass index is 27.11 kg/m.  Usual body weight = 91 kg  Assessment:  77 YOM presented on 08/28/18 with abdominal pain and SOB.  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  Also with h/o ruptured appendicitis requiring ex-lap and his hernia is enlarging with chronic cough. CT showed SBO with gallbladder stones.  Patient has been NPO for 5 days with limited PO intake since 08/24/18 per documentation.  MD concerned patient will not improve without surgical intervention; therefore, Pharmacy consulted to initiate TPN.  Patient is at risk for refeeding.  PMH: HTN, ETOH, prostate cancer, mouth cancer, CVA  GI: Enlarging hernia, SBO. Baseline prealbumin low at 11.4, LBM 12/3, +flatus.  Prealbumin lower today at 9.6. Albumin low at 2.4. NG output down to 958mL. Probable need for surgery. Endo: no hx DM : CBGs WNL Insulin requirements in the past 24 hours: 2 units Lytes: wnl today. K trending up to 4.6. Renal: SCr up to 1.1, BUN 19 - UOP 0.3 ml/kg/hr (down), net +1.9L now. NS with KCl at 59ml/hr Pulm:  Duonebs Cards: HTN  Hepatobil: LFTs / tbili / TG WNL 133. Neuro: h/o CVA ID: r/o aspiration, on CTX (12/26 >> ), off azith for PNA - afebrile, WBC WNL, BCx negat  TPN Access: PICC placed 09/01/18  TPN start date: 09/02/18 Nutritional Goals (RD rec on 12/3): 2100-2300 kCal  110-125gm protein per day 2.1-2.3 L per day  Current Nutrition:  NPO  Plan:  Continue TPN at 54ml/hr This TPN provides 120 g of protein, 306 g of dextrose, and 65 g of lipids for a total of 2,176 kcals meeting 100% of patient needs Electrolytes in TPN: standard concentration exc decrease K and continue increased Phos; Cl:Ac 1:1 Add MVI, trace elements to  TPN Stop thiamine supplementation now that at goal Continue sensitive SSI and adjust as needed Change NS to without KCl at 33ml/hr Monitor TPN labs F/U placement of NGT, OR today or tomorrow   Elenor Quinones, PharmD, BCPS, [facility] Clinical Pharmacist Phone number [phone] 09/05/2018 9:00 AM

## 2018-09-05 NOTE — Op Note (Signed)
08/28/2018 - 09/05/2018  12:21 PM  PATIENT:  Luis Lyons  77 y.o. male  PRE-OPERATIVE DIAGNOSIS:  SBO  POST-OPERATIVE DIAGNOSIS:  SBO  PROCEDURE:  Procedure(s): EXPLORATORY LAPAROTOMY LYSIS OF ADHESIONS  SURGEON:  Surgeon(s): Georganna Skeans, MD  ASSISTANTS: Jackson Latino, PA-C   ANESTHESIA:   general  EBL:  No intake/output data recorded.  BLOOD ADMINISTERED:none  DRAINS: none   SPECIMEN:  No Specimen  DISPOSITION OF SPECIMEN:  N/A  COUNTS:  YES  DICTATION: .Dragon Dictation Findings: Small bowel adhesions lower anterior abdomen  Procedure in detail: Patient presents for exploratory laparotomy for ongoing small bowel obstruction.  He received intravenous antibiotics.  Informed consent was obtained.  He was brought to the operating room and general endotracheal anesthesia was administered by the anesthesia staff.  Foley catheter was attempted by nursing but he has a very narrow urethral meatus so that was not able to be placed.  His abdomen was prepped and draped in a sterile fashion.  We did a timeout procedure.  Midline incision was made and subcutaneous tissues were dissected down and I entered into 1 of his incisional hernias anteriorly.  The abdomen was entered.  I opened the fascia more superiorly and then the incision was continued inferiorly protecting the intra-abdominal contents.  The abdomen was explored.  He had a distended sigmoid colon.  He had proximal small bowel going down into his pelvis and then distal decompressed small bowel.  There was a adhesion of a small bowel loop to the anterior abdominal wall I took this down and this seemed to relieve the obstruction.  The small bowel was then run back from the terminal ileum up towards the ligament of Treitz and there were no other significant adhesions.  It had good peristalsis and was viable.  At this point, further lysis of adhesions was done to free up the omentum from the anterior abdominal wall to facilitate  closure.  Anesthesia attempted multiple times to place a nasogastric tube but I could not feel it in the stomach.  There were multiple adhesions in the upper abdomen.  The bowel was replaced in anatomic position and the omentum was brought over the top of it.  The abdomen was then closed with running #1 looped PDS.  I incorporated the good fascial edges of his previous incisional hernia to repair that as part of the closure.  Subcutaneous tissues irrigated and closed with staples.  All counts were correct.  He tolerated the procedure well without apparent complication and was taken recovery in stable condition. PATIENT DISPOSITION:  PACU - hemodynamically stable.   Delay start of Pharmacological VTE agent (>24hrs) due to surgical blood loss or risk of bleeding:  no  Georganna Skeans, MD, MPH, FACS Pager: [phone]  12/4/201912:21 PM

## 2018-09-05 NOTE — Significant Event (Signed)
Rapid Response Event Note  Called for assistance with NGT placement after it was removed by patient. RNs had tried x 2 and were not successful. Patient is on Lovenox, I asked the RN to paged [facility] NP and ask if it was okay to replace NGT. [facility] NP asked that RN paged [facility] MD, RN paged [facility] MD, per MD will hold off on placing NGT for on, unless patient has nausea or discomfort.   RN to call RRT if assistance needed.    NO RRT INTERVENTIONS

## 2018-09-05 NOTE — Progress Notes (Signed)
Dr Grandville Silos notifed of xray report of ng not in stomach he came to bedside ordered to remove ng and order carried out

## 2018-09-05 NOTE — Transfer of Care (Signed)
Immediate Anesthesia Transfer of Care Note  Patient: Luis Lyons  Procedure(s) Performed: EXPLORATORY LAPAROTOMY (N/A Abdomen)  Patient Location: PACU  Anesthesia Type:General  Level of Consciousness: drowsy  Airway & Oxygen Therapy: Patient Spontanous Breathing and Patient connected to nasal cannula oxygen  Post-op Assessment: Report given to RN, Post -op Vital signs reviewed and stable and Patient moving all extremities  Post vital signs: Reviewed and stable  Last Vitals:  Vitals Value Taken Time  BP 200/72 09/05/2018 12:36 PM  Temp    Pulse 89 09/05/2018 12:41 PM  Resp 24 09/05/2018 12:41 PM  SpO2 96 % 09/05/2018 12:41 PM  Vitals shown include unvalidated device data.  Last Pain:  Vitals:   09/05/18 0700  TempSrc:   PainSc: 0-No pain      Patients Stated Pain Goal: 0 (94/71/25 2712)  Complications: No apparent anesthesia complications

## 2018-09-05 NOTE — Progress Notes (Signed)
PT Cancellation Note  Patient Details Name: Luis Lyons MRN: [id number] DOB: Sep 16, 1941   Cancelled Treatment:    Reason Eval/Treat Not Completed: Patient at procedure or test/unavailable. Pt off of the floor for procedure at this time (to S 33). PT will continue to follow acutely as available.    [place] 09/05/2018, 10:29 AM

## 2018-09-05 NOTE — Progress Notes (Signed)
PROGRESS NOTE        PATIENT DETAILS Name: Luis Lyons Age: 77 y.o. Sex: male Date of Birth: 17-Mar-1941 Admit Date: 08/28/2018 Admitting Physician [name], MD [id number], [name], MD  Brief Narrative: 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  Upon further evaluation he was found to have pneumonia and a small bowel obstruction.  Unfortunately patient's obstruction was persistent even with conservative management and underwent laparotomy with lysis of adhesions on 12/4.   Subjective: Seen earlier this morning before he went to the operating room-unfortunately patient pulled out his NG tube-no BM-felt nauseous.  Assessment/Plan: SBO: No improvement in spite of conservative measures-was followed closely by general surgery-underwent exploratory laparotomy and lysis of adhesions on 12/4.  General surgery following and directing postop care.  Remains on TPN   Pneumonia: Clinically improved-thought to have component of aspiration pneumonia.  Has completed a course of antimicrobial therapy.   AKI: Resolved, to be hemodynamically mediated.  Plans are to follow electrolytes periodically.  History of CVA with chronic left-sided weakness: Remains at baseline-plans are to resume aspirin once oral intake has been resumed.  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  Antimicrobial  agents: Anti-infectives (From admission, onward)   Start     Dose/Rate Route Frequency Ordered Stop   09/05/18 1100  ceFAZolin (ANCEF) IVPB 2g/100 mL premix     2 g 200 mL/hr over 30 Minutes Intravenous  Once 09/05/18 1054 09/05/18 1111   09/05/18 1054  ceFAZolin (ANCEF) 2-4 GM/100ML-% IVPB    Note to Pharmacy:  [name]   : cabinet override      09/05/18 1054 09/05/18 1111   08/28/18 2230  cefTRIAXone (ROCEPHIN) 2 g in sodium chloride 0.9 % 100 mL IVPB     2 g 200 mL/hr over 30 Minutes Intravenous Every 24 hours 08/28/18 2215 09/03/18 2232   08/28/18 2230  azithromycin (ZITHROMAX) 500 mg in sodium chloride 0.9 % 250 mL IVPB     500 mg 250 mL/hr over 60 Minutes Intravenous Every 24 hours 08/28/18 2215 09/01/18 2229      Procedures: None  CONSULTS:  general surgery  Time spent: 25- minutes-Greater than 50% of this time was spent in counseling, explanation of diagnosis, planning of further management, and coordination of care.  MEDICATIONS: Scheduled Meds: . [MAR Hold] enoxaparin (LOVENOX) injection  40 mg Subcutaneous Q24H  . HYDROmorphone      . HYDROmorphone      . [MAR Hold] insulin aspart  0-9 Units Subcutaneous Q6H  . [MAR Hold] ipratropium-albuterol  3 mL Nebulization BID  . labetalol      . [MAR Hold] mouth rinse  15 mL Mouth Rinse BID  . [MAR Hold] polyvinyl alcohol  1 drop Both Eyes BID  . [MAR Hold] sodium phosphate  1 enema Rectal Once   Continuous Infusions: . sodium chloride    . lactated ringers 10 mL/hr at 09/05/18  1043  . TPN ADULT (ION) 85 mL/hr at 09/04/18 1706  . TPN ADULT (ION)     PRN Meds:.[MAR Hold] acetaminophen **OR** [MAR Hold] acetaminophen, [MAR Hold] albuterol, [MAR Hold] hydrALAZINE, HYDROmorphone (DILAUDID) injection, labetalol, [MAR Hold]  morphine injection, [MAR Hold] ondansetron **OR** [MAR Hold] ondansetron (ZOFRAN) IV, promethazine, [MAR Hold] sodium chloride flush   PHYSICAL EXAM: Vital signs: Vitals:   09/05/18 1300  09/05/18 1315 09/05/18 1330 09/05/18 1345  BP: (!) 190/76 (!) 163/67 (!) 150/88 (!) 156/61  Pulse: 92 73 70   Resp: (!) 29 20 (!) 22   Temp:      TempSrc:      SpO2: 93% 93% 94%   Weight:      Height:       Filed Weights   09/02/18 0500 09/05/18 0500 09/05/18 1041  Weight: 93.3 kg 93.2 kg 93.2 kg   Body mass index is 27.11 kg/m.   General appearance:Awake, alert, not in any distress.  Eyes:no scleral icterus. HEENT: Atraumatic and Normocephalic Neck: supple, no JVD. Resp:Good air entry bilaterally,no rales or rhonchi CVS: S1 S2 regular, no murmurs.  GI: Bowel sounds, slightly distended-but not tender.   Extremities: B/L Lower Ext shows no edema, both legs are warm to touch Neurology:  Non focal Psychiatric: Normal judgment and insight. Normal mood. Musculoskeletal:No digital cyanosis Skin:No Rash, warm and dry Wounds:N/A  I have personally reviewed following labs and imaging studies  LABORATORY DATA: CBC: Recent Labs  Lab 08/30/18 0530 08/31/18 0544 09/01/18 0312 09/02/18 0435 09/03/18 0356  WBC 11.0* 9.3 9.9 11.5* 10.5  NEUTROABS  --   --   --  8.7* 8.0*  HGB 12.0* 12.5* 11.2* 11.7* 10.5*  HCT 37.9* 40.8 37.9* 39.0 33.7*  MCV 89.6 91.1 91.5 91.1 90.6  PLT 302 299 309 338 433    Basic Metabolic Panel: Recent Labs  Lab 09/01/18 0312 09/02/18 0435 09/03/18 0356 09/04/18 0404 09/05/18 0405  NA 143 143 140 138 137  K 4.0 3.6 3.8 4.0 4.6  CL 110 108 110 108 106  CO2 22 23 28 23 23   GLUCOSE 98 92 114* 127* 119*  BUN 27* 25* 21 15 19   CREATININE 1.15 1.21 1.11 0.91 1.10  CALCIUM 8.6* 8.6* 8.2* 8.5* 9.0  MG  --  2.3 2.1 2.2  --   PHOS  --  2.8 2.8 2.9  --     GFR: Estimated Creatinine Clearance: 64.6 mL/min (by C-G formula based on SCr of 1.1 mg/dL).  Liver Function Tests: Recent Labs  Lab 09/02/18 0435 09/03/18 0356  AST 15 15  ALT 12 14  ALKPHOS 49 45  BILITOT 0.7 0.5  PROT 7.1 6.1*  ALBUMIN 2.7* 2.4*   No results for input(s): LIPASE,  AMYLASE in the last 168 hours. No results for input(s): AMMONIA in the last 168 hours.  Coagulation Profile: No results for input(s): INR, PROTIME in the last 168 hours.  Cardiac Enzymes: No results for input(s): CKTOTAL, CKMB, CKMBINDEX, TROPONINI in the last 168 hours.  BNP (last 3 results) No results for input(s): PROBNP in the last 8760 hours.  HbA1C: No results for input(s): HGBA1C in the last 72 hours.  CBG: Recent Labs  Lab 09/04/18 1212 09/04/18 1805 09/05/18 0009 09/05/18 0537 09/05/18 0957  GLUCAP 114* 102* 121* 127* 113*    Lipid Profile: Recent Labs    09/03/18 0356  TRIG 133    Thyroid Function Tests: No results for input(s): TSH, T4TOTAL, FREET4, T3FREE, THYROIDAB in the last  72 hours.  Anemia Panel: No results for input(s): VITAMINB12, FOLATE, FERRITIN, TIBC, IRON, RETICCTPCT in the last 72 hours.  Urine analysis:    Component Value Date/Time   COLORURINE YELLOW 08/30/2018 1245   APPEARANCEUR HAZY (A) 08/30/2018 1245   LABSPEC 1.027 08/30/2018 1245   PHURINE 5.0 08/30/2018 1245   GLUCOSEU NEGATIVE 08/30/2018 1245   HGBUR SMALL (A) 08/30/2018 1245   BILIRUBINUR NEGATIVE 08/30/2018 1245   KETONESUR 5 (A) 08/30/2018 1245   PROTEINUR 30 (A) 08/30/2018 1245   NITRITE POSITIVE (A) 08/30/2018 1245   LEUKOCYTESUR MODERATE (A) 08/30/2018 1245    Sepsis Labs: Lactic Acid, Venous    Component Value Date/Time   LATICACIDVEN 1.86 08/28/2018 2153    MICROBIOLOGY: Recent Results (from the past 240 hour(s))  Blood Culture (routine x 2)     Status: None   Collection Time: 08/28/18  9:50 PM  Result Value Ref Range Status   Specimen Description BLOOD LEFT FOREARM  Final   Special Requests   Final    BOTTLES DRAWN AEROBIC AND ANAEROBIC Blood Culture adequate volume   Culture   Final    NO GROWTH 5 DAYS Performed at [facility], [place] [address], [place], [place] [address]    Report Status 09/02/2018 FINAL  Final  Blood Culture (routine x 2)      Status: None   Collection Time: 08/28/18 10:00 PM  Result Value Ref Range Status   Specimen Description BLOOD RIGHT HAND  Final   Special Requests   Final    BOTTLES DRAWN AEROBIC AND ANAEROBIC Blood Culture adequate volume   Culture   Final    NO GROWTH 5 DAYS Performed at [facility], [place] [address] [place], [place] [address]    Report Status 09/02/2018 FINAL  Final  MRSA PCR Screening     Status: None   Collection Time: 08/29/18  2:00 AM  Result Value Ref Range Status   MRSA by PCR NEGATIVE NEGATIVE Final    Comment:        The GeneXpert MRSA Assay (FDA approved for NASAL specimens only), is one component of a comprehensive MRSA colonization surveillance program. It is not intended to diagnose MRSA infection nor to guide or monitor treatment for MRSA infections. Performed at [facility], [place] [address], [place], [place] [address]   Surgical pcr screen     Status: None   Collection Time: 09/05/18  9:54 AM  Result Value Ref Range Status   MRSA, PCR NEGATIVE NEGATIVE Final   Staphylococcus aureus NEGATIVE NEGATIVE Final    Comment: (NOTE) The Xpert SA Assay (FDA approved for NASAL specimens in patients 27 years of age and older), is one component of a comprehensive surveillance program. It is not intended to diagnose infection nor to guide or monitor treatment. Performed at [facility], [place] [address] [place],  [address]     RADIOLOGY STUDIES/RESULTS: Dg Chest 1 View  Result Date: 09/02/2018 CLINICAL DATA:  Wheezing. EXAM: CHEST  1 VIEW COMPARISON:  09/02/2018 FINDINGS: Right arm PICC line tip is in the cavoatrial junction. There is a feeding tube in place. Heart size normal. No pleural effusion or edema. No airspace opacities identified. IMPRESSION: No acute cardiopulmonary abnormalities. Electronically Signed   By: Kerby Moors M.D.   On: 09/02/2018 15:46   Dg Abd 1 View  Result Date: 09/03/2018 CLINICAL DATA:  Small-bowel  obstruction.  NG tube. EXAM: ABDOMEN - 1 VIEW COMPARISON:  Abdominal  radiographic series-09/02/2018; 09/01/2018; 08/29/2018; CT abdomen pelvis-11/26/fluoroscopic guided nasogastric tube placement-09/02/2018 FINDINGS: There is been apparent exchange of existing weighted tip feeding tube with new enteric tube tip overlying the expected location of the descending portion of the duodenum. Re demonstrated moderate gaseous distension of several rather patulous loops of small bowel, unchanged to slightly improved compared to the 09/02/2018 examination with index loop of small bowel in the right mid/lower abdomen measuring 5.6 cm in diameter. Nondiagnostic evaluation for pneumoperitoneum secondary to supine positioning exclusion of the lower thorax. Stigmata of DISH throughout the thoracic spine. Post ORIF of the left femur and femoral neck. IMPRESSION: 1. No change to slight improvement in findings again concerning for small bowel obstruction. 2. Apparent change of enteric tube with new enteric tube tip terminating over the expected location of the descending portion duodenum. Electronically Signed   By: Sandi Mariscal M.D.   On: 09/03/2018 07:46   Dg Abd 1 View  Result Date: 09/01/2018 CLINICAL DATA:  SBO, upright image requested by MD. EXAM: ABDOMEN - 1 VIEW COMPARISON:  08/30/2018 FINDINGS: Nasogastric tube has been advanced into the proximal duodenum. The stomach appears decompressed. There are multiple gas distended small bowel loops identified in the upper abdomen. The lower abdomen is excluded. Visualized portions of colon are nondilated. There is no convincing free air. Visualized lung bases unremarkable. IMPRESSION: 1. Nasogastric tube advancement into the proximal duodenum, with persistent small bowel distention. Electronically Signed   By: Lucrezia Europe M.D.   On: 09/01/2018 07:57   Dg Abd 1 View  Result Date: 08/30/2018 CLINICAL DATA:  Small bowel obstruction EXAM: ABDOMEN - 1 VIEW COMPARISON:  August 29, 2018 FINDINGS: The NG tube terminates in the proximal stomach. Small bowel dilatation is less prominent on this study but remains. No other acute abnormalities. IMPRESSION: The small bowel dilatation is less prominent but remains consistent with persistent small-bowel obstruction. The enteric tube appears to terminate in the proximal stomach. Electronically Signed   By: Dorise Bullion III M.D   On: 08/30/2018 13:22   Dg Abd 1 View  Result Date: 08/29/2018 CLINICAL DATA:  Replacement of NG tube. EXAM: ABDOMEN - 1 VIEW COMPARISON:  One-view abdomen 08/29/2018 at 8:46 a.m. FINDINGS: Side port of the NG tube is beyond the GE junction. Mildly dilated loops of small bowel are similar the prior exam. No free air is present. IMPRESSION: 1. Side port of the NG tube is beyond the GE junction. 2. Similar appearance of multiple dilated loops of small bowel without evidence of free air. Electronically Signed   By: San Morelle M.D.   On: 08/29/2018 13:08   Dg Abd 1 View  Result Date: 08/29/2018 CLINICAL DATA:  Check nasogastric catheter placement EXAM: ABDOMEN - 1 VIEW COMPARISON:  None. FINDINGS: 30 seconds of fluoroscopy was utilized for placement. The nasogastric catheter is shown coiled within the gastric lumen. IMPRESSION: Nasogastric catheter placed in the stomach. Electronically Signed   By: Inez Catalina M.D.   On: 08/29/2018 09:08   Ct Abdomen Pelvis W Contrast  Result Date: 08/28/2018 CLINICAL DATA:  Abdominal pain with shortness of breath EXAM: CT ABDOMEN AND PELVIS WITH CONTRAST TECHNIQUE: Multidetector CT imaging of the abdomen and pelvis was performed using the standard protocol following bolus administration of intravenous contrast. CONTRAST:  159mL OMNIPAQUE IOHEXOL 300 MG/ML  SOLN COMPARISON:  None. FINDINGS: LOWER CHEST: There is no basilar pleural or apical pericardial effusion. HEPATOBILIARY: The hepatic contours and density are normal. There is no intra- or  extrahepatic biliary  dilatation. There is cholelithiasis without acute inflammation. PANCREAS: The pancreatic parenchymal contours are normal and there is no ductal dilatation. There is no peripancreatic fluid collection. SPLEEN: Normal. ADRENALS/URINARY TRACT: --Adrenal glands: Normal. --Right kidney/ureter: Mild atrophy. No hydronephrosis. --Left kidney/ureter: Vascular calcifications and interpolar nonobstructive 3 mm nephrolithiasis. No hydronephrosis. --Urinary bladder: Mild bladder wall thickening, possibly due to underdistention. STOMACH/BOWEL: --Stomach/Duodenum: There is no hiatal hernia or other gastric abnormality. The duodenal course and caliber are normal. --Small bowel: There are multiple loops of dilated small bowel in the central abdomen, predominantly anteriorly. One of these loops is directly beneath the skin surface, extending through a diastatic defect of the anterior abdominal muscles. Suspected transition point is in the anterior low midline abdomen (coronal image 24 and sagittal image 70). --Colon: No focal abnormality. --Appendix: Not visualized. No right lower quadrant inflammation or free fluid. VASCULAR/LYMPHATIC: Atherosclerotic calcification is present within the non-aneurysmal abdominal aorta, without hemodynamically significant stenosis. The portal vein, splenic vein, superior mesenteric vein and IVC are patent. No abdominal or pelvic lymphadenopathy. REPRODUCTIVE: Normal prostate size with symmetric seminal vesicles. MUSCULOSKELETAL. Compression deformity of L2 is probably chronic. There is grade 1 L4-5 anterolisthesis secondary to facet arthrosis. OTHER: None. IMPRESSION: 1. Small bowel obstruction with suspected transition point in the low anterior midline abdomen. 2. Cholelithiasis without acute inflammation. 3. Nonobstructing left nephrolithiasis. Aortic Atherosclerosis (ICD10-I70.0). Electronically Signed   By: Ulyses Jarred M.D.   On: 08/28/2018 23:21   Dg Chest Port 1 View  Result Date:  08/28/2018 CLINICAL DATA:  77 y/o  M; abdominal pain and shortness of breath. EXAM: PORTABLE CHEST 1 VIEW COMPARISON:  None. FINDINGS: Normal cardiac silhouette. Aortic atherosclerosis with calcification. Perihilar and basilar reticular opacities. No consolidation. No pleural effusion or pneumothorax. No acute osseous abnormality is evident. IMPRESSION: Basilar reticular opacities may represent mild interstitial edema or atypical pneumonia. No consolidation. Electronically Signed   By: Kristine Garbe M.D.   On: 08/28/2018 21:55   Dg Abd Portable 1v  Result Date: 09/05/2018 CLINICAL DATA:  Ongoing abdomen pain and distention. EXAM: PORTABLE ABDOMEN - 1 VIEW COMPARISON:  09/04/2018. FINDINGS: Nasogastric tube has been removed. There has been persistence of and perhaps mild progression of dilated small bowel loops along with inspissated contrast throughout a distended colon. IMPRESSION: Slight progression of dilated small bowel loops along with inspissated contrast throughout a distended colon. Findings remain consistent with a partial small bowel obstruction. Electronically Signed   By: Staci Righter M.D.   On: 09/05/2018 10:43   Dg Abd Portable 1v  Result Date: 09/04/2018 CLINICAL DATA:  Follow up small bowel obstruction EXAM: PORTABLE ABDOMEN - 1 VIEW COMPARISON:  09/03/2018 FINDINGS: Nasogastric catheter is noted in the second portion of the duodenum stable from the prior exam. Scattered large and small bowel gas is noted. There is been a slight decrease in the degree of small bowel dilatation when compared with the previous exam. No free air is noted. Postsurgical changes in the left hip are again seen. IMPRESSION: Improvement in the degree of small bowel dilatation when compared with the previous day. Continued follow-up is recommended. Electronically Signed   By: Inez Catalina M.D.   On: 09/04/2018 08:46   Dg Abd Portable 1v  Result Date: 08/29/2018 CLINICAL DATA:  Small bowel obstruction,  8 hour delayed film after Gastrografin EXAM: PORTABLE ABDOMEN - 1 VIEW COMPARISON:  Abdominal radiograph from earlier today FINDINGS: Enteric tube terminates in the proximal stomach. Moderately dilated small bowel loops throughout the  abdomen are not appreciably changed. Oral contrast transits to the mid small bowel. No definite oral contrast in the colon. Moderate colonic stool. No evidence of pneumatosis or pneumoperitoneum. Clear lung bases. IMPRESSION: 1. Enteric tube terminates in the stomach. 2. Stable moderately dilated small bowel loops compatible with small-bowel obstruction. Oral contrast transits to the mid small bowel, with no definite oral contrast in the colon. Electronically Signed   By: Ilona Sorrel M.D.   On: 08/29/2018 21:38   Dg Abd Portable 1v-small Bowel Obstruction Protocol-initial, 8 Hr Delay  Result Date: 08/29/2018 CLINICAL DATA:  Follow up small bowel obstruction EXAM: PORTABLE ABDOMEN - 1 VIEW COMPARISON:  08/28/2018 FINDINGS: Nasogastric catheter is now noted within the stomach. Fecal material is noted throughout the colon. Contrast is seen within the bladder from recent CT examination. Multiple dilated small bowel loops are again identified in the mid abdomen stable from the recent exam. No acute bony abnormality is noted. Prior L2 compression deformity is noted. IMPRESSION: Stable small bowel dilatation following nasogastric catheter placement Electronically Signed   By: Inez Catalina M.D.   On: 08/29/2018 09:09   Dg Abd Portable 2v  Result Date: 09/02/2018 CLINICAL DATA:  Initial evaluation for possible foreign body, missing piece of NG tube. EXAM: PORTABLE ABDOMEN - 2 VIEW COMPARISON:  Prior radiograph from 09/01/2018. FINDINGS: Previously seen enteric tube has been removed. No visible tube fragment or other radiopaque foreign body identified. Persistent small bowel dilatation noted within the upper abdomen, suggestive of ongoing small bowel obstruction. This appears improved  from previous. Partially visualized lung bases are grossly clear. IMPRESSION: 1. No visible tube fragment or other radiopaque foreign body identified. 2. Persistent but improved small bowel obstruction. Electronically Signed   By: Jeannine Boga M.D.   On: 09/02/2018 05:52   Dg [place]ison Bailey G Tube Plc W/fl W/rad  Result Date: 09/03/2018 CLINICAL DATA:  Were acquires nasogastric tube placement. EXAM: NASO G TUBE PLACEMENT WITH FL AND WITH RAD CONTRAST:  Water-soluble contrast FLUOROSCOPY TIME:  Fluoroscopy Time:  1 minutes 12 seconds Radiation Exposure Index (if provided by the fluoroscopic device): Number of Acquired Spot Images: 2 COMPARISON:  Earlier today FINDINGS: Patient's indwelling weighted feeding tube was removed. A nasogastric tube was then placed through the patient's right nares and under direct fluoroscopic guidance was manipulated into the stomach. The tube was in extended into the duodenum. A small volume of water-soluble contrast material was injected into the nasogastric tube in the tip terminates at the distal portion of the descending duodenum. IMPRESSION: Removal of indwelling weighted feeding tube with subsequent fluoroscopic guided placement of nasogastric tube into the proximal duodenum. Contrast injected into the nasogastric tube confirms tip location at the distal portion of the descending duodenum. Electronically Signed   By: Kerby Moors M.D.   On: 09/03/2018 12:51   Dg Addison Bailey G Tube Plc W/fl W/rad  Result Date: 09/02/2018 CLINICAL DATA:  NG tube placement. EXAM: NASO G TUBE PLACEMENT WITH FL AND WITH RAD CONTRAST:  Water-soluble contrast FLUOROSCOPY TIME:  Fluoroscopy Time:  5 minutes 48 seconds Radiation Exposure Index (if provided by the fluoroscopic device): Number of Acquired Spot Images: 2 COMPARISON:  09/02/2018 FINDINGS: Under direct fluoroscopic guidance a weighted nasogastric tube was placed into the right nares. The tube was then guided into the esophagus, past the GE  junction and into the gastric lumen. The tube was then manipulated into the third portion of the duodenum. A small volume of water-soluble contrast material was injected into the  tube confirming placement of the tube at the level of the duodenal jejunal junction. IMPRESSION: 1. Successful fluoroscopic guided weighted nasogastric tube placement. The tip is at the level of the duodenal jejunal junction and is ready for use. Electronically Signed   By: Kerby Moors M.D.   On: 09/02/2018 11:43   [place] Ekg Site Rite  Result Date: 09/01/2018 If Site Rite image not attached, placement could not be confirmed due to current cardiac rhythm.    LOS: 8 days   Oren Binet, MD  [facility]  If 7PM-7AM, please contact night-coverage  Please page via [email or web address]-click on MD name and type text message  09/05/2018, 2:12 PM

## 2018-09-06 ENCOUNTER — Encounter (HOSPITAL_COMMUNITY): Payer: Self-pay | Admitting: General Surgery

## 2018-09-06 ENCOUNTER — Inpatient Hospital Stay (HOSPITAL_COMMUNITY): Payer: Medicare Other

## 2018-09-06 LAB — COMPREHENSIVE METABOLIC PANEL
ALT: 32 U/L (ref 0–44)
AST: 39 U/L (ref 15–41)
Albumin: 2.5 g/dL — ABNORMAL LOW (ref 3.5–5.0)
Alkaline Phosphatase: 54 U/L (ref 38–126)
Anion gap: 8 (ref 5–15)
BUN: 28 mg/dL — ABNORMAL HIGH (ref 8–23)
CO2: 21 mmol/L — AB (ref 22–32)
Calcium: 8.9 mg/dL (ref 8.9–10.3)
Chloride: 106 mmol/L (ref 98–111)
Creatinine, Ser: 0.96 mg/dL (ref 0.61–1.24)
GFR calc Af Amer: >60 mL/min (ref >60)
GFR calc non Af Amer: >60 mL/min (ref >60)
Glucose, Bld: 134 mg/dL — ABNORMAL HIGH (ref 70–99)
Potassium: 4.8 mmol/L (ref 3.5–5.1)
SODIUM: 135 mmol/L (ref 135–145)
Total Bilirubin: 0.4 mg/dL (ref 0.3–1.2)
Total Protein: 6.8 g/dL (ref 6.5–8.1)

## 2018-09-06 LAB — GLUCOSE, CAPILLARY
GLUCOSE-CAPILLARY: 113 mg/dL — AB (ref 70–99)
Glucose-Capillary: 105 mg/dL — ABNORMAL HIGH (ref 70–99)
Glucose-Capillary: 111 mg/dL — ABNORMAL HIGH (ref 70–99)
Glucose-Capillary: 144 mg/dL — ABNORMAL HIGH (ref 70–99)

## 2018-09-06 LAB — CBC
HCT: 37.6 % — ABNORMAL LOW (ref 39.0–52.0)
Hemoglobin: 11.5 g/dL — ABNORMAL LOW (ref 13.0–17.0)
MCH: 27.7 pg (ref 26.0–34.0)
MCHC: 30.6 g/dL (ref 30.0–36.0)
MCV: 90.6 fL (ref 80.0–100.0)
NRBC: 0 % (ref 0.0–0.2)
Platelets: 314 10*3/uL (ref 150–400)
RBC: 4.15 MIL/uL — ABNORMAL LOW (ref 4.22–5.81)
RDW: 13.4 % (ref 11.5–15.5)
WBC: 20 10*3/uL — ABNORMAL HIGH (ref 4.0–10.5)

## 2018-09-06 LAB — PHOSPHORUS: Phosphorus: 3.7 mg/dL (ref 2.5–4.6)

## 2018-09-06 LAB — MAGNESIUM: Magnesium: 2.3 mg/dL (ref 1.7–2.4)

## 2018-09-06 MED ORDER — LIDOCAINE VISCOUS HCL 2 % MT SOLN
15.0000 mL | Freq: Once | OROMUCOSAL | Status: AC
  Administered 2018-09-06: 6 mL via OROMUCOSAL

## 2018-09-06 MED ORDER — PROMETHAZINE HCL 25 MG/ML IJ SOLN
12.5000 mg | Freq: Once | INTRAMUSCULAR | Status: AC
  Administered 2018-09-06: 12.5 mg via INTRAVENOUS
  Filled 2018-09-06: qty 1

## 2018-09-06 MED ORDER — LIDOCAINE VISCOUS HCL 2 % MT SOLN
OROMUCOSAL | Status: AC
  Administered 2018-09-06: 6 mL via OROMUCOSAL
  Filled 2018-09-06: qty 15

## 2018-09-06 MED ORDER — MENTHOL 3 MG MT LOZG: 1.0000 | LOZENGE | OROMUCOSAL | Status: DC | PRN

## 2018-09-06 MED ORDER — HYDRALAZINE HCL 20 MG/ML IJ SOLN: 10.0000 mg | INTRAMUSCULAR | Status: DC | PRN

## 2018-09-06 MED ORDER — TRAVASOL 10 % IV SOLN
INTRAVENOUS | Status: AC
  Administered 2018-09-06: 17:00:00 via INTRAVENOUS
  Filled 2018-09-06: qty 1203.6

## 2018-09-06 NOTE — Progress Notes (Signed)
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 ARF -improved,Creatininenormalized Likely aspirationPNA- finished course of ABX  pSBO - CT 11/26 w/ transition zone in lower midline - NG tube placed in fluorox3, NGT pulled out again on 11/30, and on 12/03 - SB protocol,persistently dilated loops of small bowel seen on AXR's but today improved - S/P ex lap with LOA, BT, 12/04 - ileus suspected as pt has not had flatus and is nauseated, he is a high risk for aspiration so will have fluro place another NGT  FEN - NPO, TNA11/30>>prealbumin 11 ID - no abx currenlty; afebrile, WBC20 but suspect this is from surgery VTE - SCD's, Lovenox  Plan: NGT, await return of bowel function, IS, monitor WBC    LOS: 9 days    Subjective: CC: abdominal pain  He is nauseated but did not vomit. He denies flatus. NGT was not well positioned yesterday so it was removed. Discussed with daughter that we will need to place another one until he has return of bowel function.   Objective: Vital signs in last 24 hours: Temp:  [97.4 F (36.3 C)-98.2 F (36.8 C)] 98 F (36.7 C) (12/05 0555) Pulse Rate:  [66-94] 74 (12/05 0555) Resp:  [13-29] 20 (12/05 0555) BP: (145-200)/(58-88) 158/76 (12/05 0555) SpO2:  [93 %-97 %] 96 % (12/05 0555) Weight:  [93.2 kg-96 kg] 96 kg (12/05 0500) Last BM Date: 09/04/18  Intake/Output from previous day: 12/04 0701 - 12/05 0700 In: 1185.7 [I.V.:930; IV Piggyback:255.7] Out: 52 [Urine:2; Blood:50] Intake/Output this shift: No intake/output data recorded.  PE: Gen: Alert, NAD, pleasant, cooperative HEENT:mandibular deformity from previous surgery Lungs: rate and effort normal Abd: Soft,mild distention, no BS appreciated,midline incision C/D/I, appropriately tender Skin: no rashes noted, warm and  dry    Anti-infectives: Anti-infectives (From admission, onward)   Start     Dose/Rate Route Frequency Ordered Stop   09/05/18 1100  ceFAZolin (ANCEF) IVPB 2g/100 mL premix     2 g 200 mL/hr over 30 Minutes Intravenous  Once 09/05/18 1054 09/05/18 1111   09/05/18 1054  ceFAZolin (ANCEF) 2-4 GM/100ML-% IVPB    Note to Pharmacy:  [name]   : cabinet override      09/05/18 1054 09/05/18 1111   08/28/18 2230  cefTRIAXone (ROCEPHIN) 2 g in sodium chloride 0.9 % 100 mL IVPB     2 g 200 mL/hr over 30 Minutes Intravenous Every 24 hours 08/28/18 2215 09/03/18 2232   08/28/18 2230  azithromycin (ZITHROMAX) 500 mg in sodium chloride 0.9 % 250 mL IVPB     500 mg 250 mL/hr over 60 Minutes Intravenous Every 24 hours 08/28/18 2215 09/01/18 2229      Lab Results:  Recent Labs    09/06/18 0414  WBC 20.0*  HGB 11.5*  HCT 37.6*  PLT 314   BMET Recent Labs    09/05/18 0405 09/06/18 0414  NA 137 135  K 4.6 4.8  CL 106 106  CO2 23 21*  GLUCOSE 119* 134*  BUN 19 28*  CREATININE 1.10 0.96  CALCIUM 9.0 8.9   PT/INR No results for input(s): LABPROT, INR in the last 72 hours. CMP     Component Value Date/Time   NA 135 09/06/2018 0414   K 4.8 09/06/2018 0414   CL 106 09/06/2018 0414   CO2 21 (L) 09/06/2018 0414   GLUCOSE 134 (H) 09/06/2018  0414   BUN 28 (H) 09/06/2018 0414   CREATININE 0.96 09/06/2018 0414   CALCIUM 8.9 09/06/2018 0414   PROT 6.8 09/06/2018 0414   ALBUMIN 2.5 (L) 09/06/2018 0414   AST 39 09/06/2018 0414   ALT 32 09/06/2018 0414   ALKPHOS 54 09/06/2018 0414   BILITOT 0.4 09/06/2018 0414   GFRNONAA >60 09/06/2018 0414   GFRAA >60 09/06/2018 0414   Lipase     Component Value Date/Time   LIPASE 25 08/28/2018 2142    Studies/Results: X-ray Abdomen Ap  Result Date: 09/05/2018 CLINICAL DATA:  Encounter for NG tube placement. EXAM: ABDOMEN - 1 VIEW COMPARISON:  None. FINDINGS: The radiograph is suboptimal, underpenetrated and includes overlapping  shadows of the patient's RIGHT hand. A tube projects over the lower thoracic region, with its tip likely in the distal esophagus, but not in the stomach. Nonobstructive gas pattern. Surgical clips overlie the RIGHT abdomen. IMPRESSION: Suboptimal radiograph. Nasogastric tube is not in the stomach, with tip appearing to project over distal esophagus. Recommend repositioning the tube, with repeat imaging. Electronically Signed   By: Staci Righter M.D.   On: 09/05/2018 15:12   Dg Abd Portable 1v  Result Date: 09/05/2018 CLINICAL DATA:  Ongoing abdomen pain and distention. EXAM: PORTABLE ABDOMEN - 1 VIEW COMPARISON:  09/04/2018. FINDINGS: Nasogastric tube has been removed. There has been persistence of and perhaps mild progression of dilated small bowel loops along with inspissated contrast throughout a distended colon. IMPRESSION: Slight progression of dilated small bowel loops along with inspissated contrast throughout a distended colon. Findings remain consistent with a partial small bowel obstruction. Electronically Signed   By: Staci Righter M.D.   On: 09/05/2018 10:43      Kalman Drape , [facility] Surgery 09/06/2018, 8:29 AM  Pager: [phone] Mon-Wed, Friday 7:00am-4:30pm Thurs 7am-11:30am  Consults: [phone]

## 2018-09-06 NOTE — Progress Notes (Signed)
Physical Therapy Treatment Patient Details Name: Luis Lyons MRN: [id number] DOB: 11-Oct-1940 Today's Date: 09/06/2018    History of Present Illness Pt is a 77 y/o male admitted secondary to abdominal pain and SOB. Pt found to have a SBO and had an NG tube placed. PMH including but not limited to CVA with L sided weakness, prostate cancer and mouth cancer.     PT Comments    Pt was generally more painful and fatigued, but stayed focused on following instruction to help as much as possible chair back to bed.  Pt's daughter was helpful for safety with mobility   Follow Up Recommendations  SNF     Equipment Recommendations  None recommended by PT    Recommendations for Other Services       Precautions / Restrictions Precautions Precautions: Fall Precaution Comments: NG tube    Mobility  Bed Mobility Overal bed mobility: Needs Assistance Bed Mobility: Sit to Sidelying Rolling: Max assist;+2 for physical assistance Sidelying to sit: Max assist;+2 for physical assistance     Sit to sidelying: Max assist;+2 for physical assistance General bed mobility comments: max assist to get pt positioned back on the bed ready to go down to side.  2 person assist to return to sidelying  Transfers Overall transfer level: Needs assistance   Transfers: Stand Pivot Transfers   Stand pivot transfers: Max assist;+2 safety/equipment       General transfer comment: use of pad to help pt come forward and up from the relatively lower recliner.  pt assisted pulling forward from a "bear hug" method of help.  Ambulation/Gait             General Gait Details: not able   Stairs             Wheelchair Mobility    Modified Rankin (Stroke Patients Only)       Balance Overall balance assessment: Needs assistance Sitting-balance support: Single extremity supported;Bilateral upper extremity supported Sitting balance-Leahy Scale: Poor Sitting balance - Comments: min assist with  bil Ue assist     Standing balance-Leahy Scale: Poor Standing balance comment: Significant lift and stability assist                            Cognition Arousal/Alertness: Awake/alert Behavior During Therapy: Flat affect Overall Cognitive Status: Impaired/Different from baseline                         Following Commands: Follows one step commands with increased time Safety/Judgement: Decreased awareness of deficits;Decreased awareness of safety   Problem Solving: Slow processing;Decreased initiation;Requires verbal cues;Difficulty sequencing;Requires tactile cues        Exercises Other Exercises Other Exercises: Bil LE ROM warm up prior to mobility    General Comments        Pertinent Vitals/Pain Pain Assessment: Faces Faces Pain Scale: Hurts even more Pain Location: stomach Pain Descriptors / Indicators: Grimacing;Guarding Pain Intervention(s): Monitored during session    Home Living                      Prior Function            PT Goals (current goals can now be found in the care plan section) Acute Rehab PT Goals Patient Stated Goal: decrease pain PT Goal Formulation: With patient Time For Goal Achievement: 09/14/18 Potential to Achieve Goals: Fair Progress towards PT goals: Progressing toward  goals    Frequency    Min 2X/week      PT Plan Current plan remains appropriate    Co-evaluation              AM-PAC PT "6 Clicks" Mobility   Outcome Measure  Help needed turning from your back to your side while in a flat bed without using bedrails?: A Lot Help needed moving from lying on your back to sitting on the side of a flat bed without using bedrails?: A Lot Help needed moving to and from a bed to a chair (including a wheelchair)?: A Lot Help needed standing up from a chair using your arms (e.g., wheelchair or bedside chair)?: A Lot Help needed to walk in hospital room?: Total Help needed climbing 3-5 steps  with a railing? : Total 6 Click Score: 10    End of Session Equipment Utilized During Treatment: Oxygen Activity Tolerance: Patient limited by pain Patient left: in bed;with call bell/phone within reach;with bed alarm set;with family/visitor present Nurse Communication: Mobility status PT Visit Diagnosis: Other abnormalities of gait and mobility (R26.89);Pain Pain - part of body: (abdomen)     Time: 6144-3154 PT Time Calculation (min) (ACUTE ONLY): 11 min  Charges:  $Therapeutic Activity: 8-22 mins                     09/06/2018  Donnella Sham, PT Acute Rehabilitation Services [phone]  (pager) [phone]  (office)   Tessie Fass Mahitha Hickling 09/06/2018, 2:16 PM

## 2018-09-06 NOTE — Progress Notes (Addendum)
PROGRESS NOTE        PATIENT DETAILS Name: Luis Lyons Age: 77 y.o. Sex: male Date of Birth: July 14, 1941 Admit Date: 08/28/2018 Admitting Physician [name], MD [facility]:[id number], [name], MD  Brief Narrative: 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  Upon further evaluation he was found to have pneumonia and a small bowel obstruction.  Unfortunately patient's obstruction was persistent even with conservative management and underwent laparotomy with lysis of adhesions on 12/4.   Subjective: Nauseous-NG tube was removed yesterday evening.  Has not vomited.  Abdomen appears slightly distended.  Assessment/Plan: SBO: After several days of conservative treatment with no improvement-patient underwent exploratory laparotomy and lysis of adhesions on 12/4.  He appears to have developed an ileus this morning-he is nauseous-with some mild distention of his abdomen.  General surgery has reordered fluoroscopic insertion of NG tube-n.p.o. status continues.  Remains on TNA.  General surgery following and directing care.   Leukocytosis: Suspect this is reactive-from surgery and ongoing ileus.  Is afebrile.  His most recent chest x-ray had showed complete resolution of pneumonia.  Due to clinical stability-plans are to monitor off antimicrobial therapy.  Will follow closely-if febrile-will culture and start antibiotic accordingly.  Pneumonia: He has completed a course of antimicrobial therapy-thought to have aspiration pneumonia in the setting of vomiting.  Chest x-ray on/1 had showed resolution of pneumonia.  He however remains at high risk for aspiration-and needs aggressive pulmonary toileting-hence have ordered incentive spirometry/flutter valve.  I have asked nursing staff to  mobilize patient-he needs to be out of bed to chair.  AKI: Resolved-likely hemodynamically mediated.  Follow electrolytes periodically.   History of CVA with chronic left-sided weakness: Remains at baseline-plans are to resume aspirin once oral intake has been resumed.  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  Antimicrobial agents: Anti-infectives (From admission, onward)   Start     Dose/Rate Route Frequency Ordered Stop   09/05/18 1100  ceFAZolin (ANCEF) IVPB 2g/100 mL premix     2 g 200 mL/hr over 30 Minutes Intravenous  Once 09/05/18 1054 09/05/18 1111   09/05/18 1054  ceFAZolin (ANCEF) 2-4 GM/100ML-% IVPB    Note to Pharmacy:  [name]   : cabinet override      09/05/18 1054 09/05/18 1111   08/28/18 2230  cefTRIAXone (ROCEPHIN) 2 g in sodium chloride 0.9 % 100 mL IVPB     2 g 200 mL/hr over 30 Minutes Intravenous Every 24 hours 08/28/18 2215 09/03/18 2232   08/28/18 2230  azithromycin (ZITHROMAX) 500 mg in sodium chloride 0.9 % 250 mL IVPB     500 mg 250 mL/hr over 60 Minutes Intravenous Every 24 hours 08/28/18 2215 09/01/18 2229      Procedures: 12/4>> laparotomy with lysis of adhesions 11/30>>PICC  CONSULTS:  general surgery  Time spent: 25- minutes-Greater than 50% of this time was spent in counseling, explanation of diagnosis, planning of  further management, and coordination of care.  MEDICATIONS: Scheduled Meds: . enoxaparin (LOVENOX) injection  40 mg Subcutaneous Q24H  . insulin aspart  0-9 Units Subcutaneous Q6H  . ipratropium-albuterol  3 mL Nebulization BID  . mouth rinse  15  mL Mouth Rinse BID  . polyvinyl alcohol  1 drop Both Eyes BID   Continuous Infusions: . sodium chloride 40 mL/hr at 09/05/18 2045  . TPN ADULT (ION) 85 mL/hr at 09/05/18 1833  . TPN ADULT (ION)     PRN Meds:.albuterol, hydrALAZINE, HYDROmorphone (DILAUDID) injection, menthol-cetylpyridinium, ondansetron **OR** ondansetron (ZOFRAN) IV, sodium chloride flush   PHYSICAL EXAM: Vital signs: Vitals:   09/05/18 2242 09/06/18 0500 09/06/18 0555 09/06/18 0843  BP: (!) 145/58  (!) 158/76   Pulse: 76  74   Resp:   20   Temp:   98 F (36.7 C)   TempSrc:   Oral   SpO2:   96% 97%  Weight:  96 kg    Height:  6' (1.829 m)     Filed Weights   09/05/18 0500 09/05/18 1041 09/06/18 0500  Weight: 93.2 kg 93.2 kg 96 kg   Body mass index is 28.7 kg/m.   General appearance:Awake, alert, frail-chronically sick appearing.  Not in acute distress. Eyes:no scleral icterus. HEENT: Atraumatic and Normocephalic Neck: supple, no JVD. Resp:Good air entry bilaterally, both rhonchi-transmitted upper airway sounds all over. CVS: S1 S2 regular, no murmurs.  GI: Bowel sounds absent-slightly distended-dressing in place-not soaked.  Extremities: B/L Lower Ext shows no edema, both legs are warm to touch Neurology:  Non focal Musculoskeletal:No digital cyanosis Skin:No Rash, warm and dry Wounds:N/A  I have personally reviewed following labs and imaging studies  LABORATORY DATA: CBC: Recent Labs  Lab 08/31/18 0544 09/01/18 0312 09/02/18 0435 09/03/18 0356 09/06/18 0414  WBC 9.3 9.9 11.5* 10.5 20.0*  NEUTROABS  --   --  8.7* 8.0*  --   HGB 12.5* 11.2* 11.7* 10.5* 11.5*  HCT 40.8 37.9* 39.0 33.7* 37.6*  MCV 91.1 91.5 91.1 90.6 90.6  PLT 299 309 338 274 638    Basic Metabolic Panel: Recent Labs  Lab 09/02/18 0435 09/03/18 0356 09/04/18 0404 09/05/18 0405 09/06/18 0414  NA 143 140 138 137 135  K 3.6 3.8 4.0 4.6 4.8  CL 108 110 108 106 106  CO2 23 28 23 23  21*  GLUCOSE 92 114* 127* 119*  134*  BUN 25* 21 15 19  28*  CREATININE 1.21 1.11 0.91 1.10 0.96  CALCIUM 8.6* 8.2* 8.5* 9.0 8.9  MG 2.3 2.1 2.2  --  2.3  PHOS 2.8 2.8 2.9  --  3.7    GFR: Estimated Creatinine Clearance: 78.7 mL/min (by C-G formula based on SCr of 0.96 mg/dL).  Liver Function Tests: Recent Labs  Lab 09/02/18 0435 09/03/18 0356 09/06/18 0414  AST 15 15 39  ALT 12 14 32  ALKPHOS 49 45 54  BILITOT 0.7 0.5 0.4  PROT 7.1 6.1* 6.8  ALBUMIN 2.7* 2.4* 2.5*   No results for input(s): LIPASE, AMYLASE in the last 168 hours. No results for input(s): AMMONIA in the last 168 hours.  Coagulation Profile: No results for input(s): INR, PROTIME in the last 168 hours.  Cardiac Enzymes: No results for input(s): CKTOTAL, CKMB, CKMBINDEX, TROPONINI in the last 168 hours.  BNP (last 3 results) No results for input(s): PROBNP in the last 8760 hours.  HbA1C: No results for input(s): HGBA1C in the last 72 hours.  CBG: Recent Labs  Lab  09/05/18 0537 09/05/18 0957 09/05/18 1749 09/06/18 0036 09/06/18 0552  GLUCAP 127* 113* 135* 144* 105*    Lipid Profile: No results for input(s): CHOL, HDL, LDLCALC, TRIG, CHOLHDL, LDLDIRECT in the last 72 hours.  Thyroid Function Tests: No results for input(s): TSH, T4TOTAL, FREET4, T3FREE, THYROIDAB in the last 72 hours.  Anemia Panel: No results for input(s): VITAMINB12, FOLATE, FERRITIN, TIBC, IRON, RETICCTPCT in the last 72 hours.  Urine analysis:    Component Value Date/Time   COLORURINE YELLOW 08/30/2018 1245   APPEARANCEUR HAZY (A) 08/30/2018 1245   LABSPEC 1.027 08/30/2018 1245   PHURINE 5.0 08/30/2018 1245   GLUCOSEU NEGATIVE 08/30/2018 1245   HGBUR SMALL (A) 08/30/2018 1245   BILIRUBINUR NEGATIVE 08/30/2018 1245   KETONESUR 5 (A) 08/30/2018 1245   PROTEINUR 30 (A) 08/30/2018 1245   NITRITE POSITIVE (A) 08/30/2018 1245   LEUKOCYTESUR MODERATE (A) 08/30/2018 1245    Sepsis Labs: Lactic Acid, Venous    Component Value Date/Time    LATICACIDVEN 1.86 08/28/2018 2153    MICROBIOLOGY: Recent Results (from the past 240 hour(s))  Blood Culture (routine x 2)     Status: None   Collection Time: 08/28/18  9:50 PM  Result Value Ref Range Status   Specimen Description BLOOD LEFT FOREARM  Final   Special Requests   Final    BOTTLES DRAWN AEROBIC AND ANAEROBIC Blood Culture adequate volume   Culture   Final    NO GROWTH 5 DAYS Performed at [facility], [place] [address], [place], [place] [address]    Report Status 09/02/2018 FINAL  Final  Blood Culture (routine x 2)     Status: None   Collection Time: 08/28/18 10:00 PM  Result Value Ref Range Status   Specimen Description BLOOD RIGHT HAND  Final   Special Requests   Final    BOTTLES DRAWN AEROBIC AND ANAEROBIC Blood Culture adequate volume   Culture   Final    NO GROWTH 5 DAYS Performed at [facility], [place] [address], [place], [place] [address]    Report Status 09/02/2018 FINAL  Final  MRSA PCR Screening     Status: None   Collection Time: 08/29/18  2:00 AM  Result Value Ref Range Status   MRSA by PCR NEGATIVE NEGATIVE Final    Comment:        The GeneXpert MRSA Assay (FDA approved for NASAL specimens only), is one component of a comprehensive MRSA colonization surveillance program. It is not intended to diagnose MRSA infection nor to guide or monitor treatment for MRSA infections. Performed at [facility], [place] [address] [place], [place] [address]   Surgical pcr screen     Status: None   Collection Time: 09/05/18  9:54 AM  Result Value Ref Range Status   MRSA, PCR NEGATIVE NEGATIVE Final   Staphylococcus aureus NEGATIVE NEGATIVE Final    Comment: (NOTE) The Xpert SA Assay (FDA approved for NASAL specimens in patients 18 years of age and older), is one component of a comprehensive surveillance program. It is not intended to diagnose infection nor to guide or monitor treatment. Performed at [facility], [place] [address],  [place], [place] [address]     RADIOLOGY STUDIES/RESULTS: Dg Chest 1 View  Result Date: 09/02/2018 CLINICAL DATA:  Wheezing. EXAM: CHEST  1 VIEW COMPARISON:  09/02/2018 FINDINGS: Right arm PICC line tip is in the cavoatrial junction. There is a feeding tube in place. Heart size normal. No  pleural effusion or edema. No airspace opacities identified. IMPRESSION: No acute cardiopulmonary abnormalities. Electronically Signed   By: Kerby Moors M.D.   On: 09/02/2018 15:46   X-ray Abdomen Ap  Result Date: 09/05/2018 CLINICAL DATA:  Encounter for NG tube placement. EXAM: ABDOMEN - 1 VIEW COMPARISON:  None. FINDINGS: The radiograph is suboptimal, underpenetrated and includes overlapping shadows of the patient's RIGHT hand. A tube projects over the lower thoracic region, with its tip likely in the distal esophagus, but not in the stomach. Nonobstructive gas pattern. Surgical clips overlie the RIGHT abdomen. IMPRESSION: Suboptimal radiograph. Nasogastric tube is not in the stomach, with tip appearing to project over distal esophagus. Recommend repositioning the tube, with repeat imaging. Electronically Signed   By: Staci Righter M.D.   On: 09/05/2018 15:12   Dg Abd 1 View  Result Date: 09/03/2018 CLINICAL DATA:  Small-bowel obstruction.  NG tube. EXAM: ABDOMEN - 1 VIEW COMPARISON:  Abdominal radiographic series-09/02/2018; 09/01/2018; 08/29/2018; CT abdomen pelvis-11/26/fluoroscopic guided nasogastric tube placement-09/02/2018 FINDINGS: There is been apparent exchange of existing weighted tip feeding tube with new enteric tube tip overlying the expected location of the descending portion of the duodenum. Re demonstrated moderate gaseous distension of several rather patulous loops of small bowel, unchanged to slightly improved compared to the 09/02/2018 examination with index loop of small bowel in the right mid/lower abdomen measuring 5.6 cm in diameter. Nondiagnostic evaluation for pneumoperitoneum secondary to  supine positioning exclusion of the lower thorax. Stigmata of DISH throughout the thoracic spine. Post ORIF of the left femur and femoral neck. IMPRESSION: 1. No change to slight improvement in findings again concerning for small bowel obstruction. 2. Apparent change of enteric tube with new enteric tube tip terminating over the expected location of the descending portion duodenum. Electronically Signed   By: Sandi Mariscal M.D.   On: 09/03/2018 07:46   Dg Abd 1 View  Result Date: 09/01/2018 CLINICAL DATA:  SBO, upright image requested by MD. EXAM: ABDOMEN - 1 VIEW COMPARISON:  08/30/2018 FINDINGS: Nasogastric tube has been advanced into the proximal duodenum. The stomach appears decompressed. There are multiple gas distended small bowel loops identified in the upper abdomen. The lower abdomen is excluded. Visualized portions of colon are nondilated. There is no convincing free air. Visualized lung bases unremarkable. IMPRESSION: 1. Nasogastric tube advancement into the proximal duodenum, with persistent small bowel distention. Electronically Signed   By: Lucrezia Europe M.D.   On: 09/01/2018 07:57   Dg Abd 1 View  Result Date: 08/30/2018 CLINICAL DATA:  Small bowel obstruction EXAM: ABDOMEN - 1 VIEW COMPARISON:  August 29, 2018 FINDINGS: The NG tube terminates in the proximal stomach. Small bowel dilatation is less prominent on this study but remains. No other acute abnormalities. IMPRESSION: The small bowel dilatation is less prominent but remains consistent with persistent small-bowel obstruction. The enteric tube appears to terminate in the proximal stomach. Electronically Signed   By: Dorise Bullion III M.D   On: 08/30/2018 13:22   Dg Abd 1 View  Result Date: 08/29/2018 CLINICAL DATA:  Replacement of NG tube. EXAM: ABDOMEN - 1 VIEW COMPARISON:  One-view abdomen 08/29/2018 at 8:46 a.m. FINDINGS: Side port of the NG tube is beyond the GE junction. Mildly dilated loops of small bowel are similar the  prior exam. No free air is present. IMPRESSION: 1. Side port of the NG tube is beyond the GE junction. 2. Similar appearance of multiple dilated loops of small bowel without evidence of free air. Electronically  Signed   By: San Morelle M.D.   On: 08/29/2018 13:08   Dg Abd 1 View  Result Date: 08/29/2018 CLINICAL DATA:  Check nasogastric catheter placement EXAM: ABDOMEN - 1 VIEW COMPARISON:  None. FINDINGS: 30 seconds of fluoroscopy was utilized for placement. The nasogastric catheter is shown coiled within the gastric lumen. IMPRESSION: Nasogastric catheter placed in the stomach. Electronically Signed   By: Inez Catalina M.D.   On: 08/29/2018 09:08   Ct Abdomen Pelvis W Contrast  Result Date: 08/28/2018 CLINICAL DATA:  Abdominal pain with shortness of breath EXAM: CT ABDOMEN AND PELVIS WITH CONTRAST TECHNIQUE: Multidetector CT imaging of the abdomen and pelvis was performed using the standard protocol following bolus administration of intravenous contrast. CONTRAST:  14mL OMNIPAQUE IOHEXOL 300 MG/ML  SOLN COMPARISON:  None. FINDINGS: LOWER CHEST: There is no basilar pleural or apical pericardial effusion. HEPATOBILIARY: The hepatic contours and density are normal. There is no intra- or extrahepatic biliary dilatation. There is cholelithiasis without acute inflammation. PANCREAS: The pancreatic parenchymal contours are normal and there is no ductal dilatation. There is no peripancreatic fluid collection. SPLEEN: Normal. ADRENALS/URINARY TRACT: --Adrenal glands: Normal. --Right kidney/ureter: Mild atrophy. No hydronephrosis. --Left kidney/ureter: Vascular calcifications and interpolar nonobstructive 3 mm nephrolithiasis. No hydronephrosis. --Urinary bladder: Mild bladder wall thickening, possibly due to underdistention. STOMACH/BOWEL: --Stomach/Duodenum: There is no hiatal hernia or other gastric abnormality. The duodenal course and caliber are normal. --Small bowel: There are multiple loops of  dilated small bowel in the central abdomen, predominantly anteriorly. One of these loops is directly beneath the skin surface, extending through a diastatic defect of the anterior abdominal muscles. Suspected transition point is in the anterior low midline abdomen (coronal image 24 and sagittal image 70). --Colon: No focal abnormality. --Appendix: Not visualized. No right lower quadrant inflammation or free fluid. VASCULAR/LYMPHATIC: Atherosclerotic calcification is present within the non-aneurysmal abdominal aorta, without hemodynamically significant stenosis. The portal vein, splenic vein, superior mesenteric vein and IVC are patent. No abdominal or pelvic lymphadenopathy. REPRODUCTIVE: Normal prostate size with symmetric seminal vesicles. MUSCULOSKELETAL. Compression deformity of L2 is probably chronic. There is grade 1 L4-5 anterolisthesis secondary to facet arthrosis. OTHER: None. IMPRESSION: 1. Small bowel obstruction with suspected transition point in the low anterior midline abdomen. 2. Cholelithiasis without acute inflammation. 3. Nonobstructing left nephrolithiasis. Aortic Atherosclerosis (ICD10-I70.0). Electronically Signed   By: Ulyses Jarred M.D.   On: 08/28/2018 23:21   Dg Chest Port 1 View  Result Date: 08/28/2018 CLINICAL DATA:  77 y/o  M; abdominal pain and shortness of breath. EXAM: PORTABLE CHEST 1 VIEW COMPARISON:  None. FINDINGS: Normal cardiac silhouette. Aortic atherosclerosis with calcification. Perihilar and basilar reticular opacities. No consolidation. No pleural effusion or pneumothorax. No acute osseous abnormality is evident. IMPRESSION: Basilar reticular opacities may represent mild interstitial edema or atypical pneumonia. No consolidation. Electronically Signed   By: Kristine Garbe M.D.   On: 08/28/2018 21:55   Dg Abd Portable 1v  Result Date: 09/05/2018 CLINICAL DATA:  Ongoing abdomen pain and distention. EXAM: PORTABLE ABDOMEN - 1 VIEW COMPARISON:  09/04/2018.  FINDINGS: Nasogastric tube has been removed. There has been persistence of and perhaps mild progression of dilated small bowel loops along with inspissated contrast throughout a distended colon. IMPRESSION: Slight progression of dilated small bowel loops along with inspissated contrast throughout a distended colon. Findings remain consistent with a partial small bowel obstruction. Electronically Signed   By: Staci Righter M.D.   On: 09/05/2018 10:43   Dg Abd Portable 1v  Result Date: 09/04/2018 CLINICAL DATA:  Follow up small bowel obstruction EXAM: PORTABLE ABDOMEN - 1 VIEW COMPARISON:  09/03/2018 FINDINGS: Nasogastric catheter is noted in the second portion of the duodenum stable from the prior exam. Scattered large and small bowel gas is noted. There is been a slight decrease in the degree of small bowel dilatation when compared with the previous exam. No free air is noted. Postsurgical changes in the left hip are again seen. IMPRESSION: Improvement in the degree of small bowel dilatation when compared with the previous day. Continued follow-up is recommended. Electronically Signed   By: Inez Catalina M.D.   On: 09/04/2018 08:46   Dg Abd Portable 1v  Result Date: 08/29/2018 CLINICAL DATA:  Small bowel obstruction, 8 hour delayed film after Gastrografin EXAM: PORTABLE ABDOMEN - 1 VIEW COMPARISON:  Abdominal radiograph from earlier today FINDINGS: Enteric tube terminates in the proximal stomach. Moderately dilated small bowel loops throughout the abdomen are not appreciably changed. Oral contrast transits to the mid small bowel. No definite oral contrast in the colon. Moderate colonic stool. No evidence of pneumatosis or pneumoperitoneum. Clear lung bases. IMPRESSION: 1. Enteric tube terminates in the stomach. 2. Stable moderately dilated small bowel loops compatible with small-bowel obstruction. Oral contrast transits to the mid small bowel, with no definite oral contrast in the colon. Electronically  Signed   By: Ilona Sorrel M.D.   On: 08/29/2018 21:38   Dg Abd Portable 1v-small Bowel Obstruction Protocol-initial, 8 Hr Delay  Result Date: 08/29/2018 CLINICAL DATA:  Follow up small bowel obstruction EXAM: PORTABLE ABDOMEN - 1 VIEW COMPARISON:  08/28/2018 FINDINGS: Nasogastric catheter is now noted within the stomach. Fecal material is noted throughout the colon. Contrast is seen within the bladder from recent CT examination. Multiple dilated small bowel loops are again identified in the mid abdomen stable from the recent exam. No acute bony abnormality is noted. Prior L2 compression deformity is noted. IMPRESSION: Stable small bowel dilatation following nasogastric catheter placement Electronically Signed   By: Inez Catalina M.D.   On: 08/29/2018 09:09   Dg Abd Portable 2v  Result Date: 09/02/2018 CLINICAL DATA:  Initial evaluation for possible foreign body, missing piece of NG tube. EXAM: PORTABLE ABDOMEN - 2 VIEW COMPARISON:  Prior radiograph from 09/01/2018. FINDINGS: Previously seen enteric tube has been removed. No visible tube fragment or other radiopaque foreign body identified. Persistent small bowel dilatation noted within the upper abdomen, suggestive of ongoing small bowel obstruction. This appears improved from previous. Partially visualized lung bases are grossly clear. IMPRESSION: 1. No visible tube fragment or other radiopaque foreign body identified. 2. Persistent but improved small bowel obstruction. Electronically Signed   By: Jeannine Boga M.D.   On: 09/02/2018 05:52   Dg Addison Bailey G Tube Plc W/fl W/rad  Result Date: 09/03/2018 CLINICAL DATA:  Were acquires nasogastric tube placement. EXAM: NASO G TUBE PLACEMENT WITH FL AND WITH RAD CONTRAST:  Water-soluble contrast FLUOROSCOPY TIME:  Fluoroscopy Time:  1 minutes 12 seconds Radiation Exposure Index (if provided by the fluoroscopic device): Number of Acquired Spot Images: 2 COMPARISON:  Earlier today FINDINGS: Patient's indwelling  weighted feeding tube was removed. A nasogastric tube was then placed through the patient's right nares and under direct fluoroscopic guidance was manipulated into the stomach. The tube was in extended into the duodenum. A small volume of water-soluble contrast material was injected into the nasogastric tube in the tip terminates at the distal portion of the descending duodenum. IMPRESSION: Removal of indwelling weighted  feeding tube with subsequent fluoroscopic guided placement of nasogastric tube into the proximal duodenum. Contrast injected into the nasogastric tube confirms tip location at the distal portion of the descending duodenum. Electronically Signed   By: Kerby Moors M.D.   On: 09/03/2018 12:51   Dg Addison Bailey G Tube Plc W/fl W/rad  Result Date: 09/02/2018 CLINICAL DATA:  NG tube placement. EXAM: NASO G TUBE PLACEMENT WITH FL AND WITH RAD CONTRAST:  Water-soluble contrast FLUOROSCOPY TIME:  Fluoroscopy Time:  5 minutes 48 seconds Radiation Exposure Index (if provided by the fluoroscopic device): Number of Acquired Spot Images: 2 COMPARISON:  09/02/2018 FINDINGS: Under direct fluoroscopic guidance a weighted nasogastric tube was placed into the right nares. The tube was then guided into the esophagus, past the GE junction and into the gastric lumen. The tube was then manipulated into the third portion of the duodenum. A small volume of water-soluble contrast material was injected into the tube confirming placement of the tube at the level of the duodenal jejunal junction. IMPRESSION: 1. Successful fluoroscopic guided weighted nasogastric tube placement. The tip is at the level of the duodenal jejunal junction and is ready for use. Electronically Signed   By: Kerby Moors M.D.   On: 09/02/2018 11:43   [place] Ekg Site Rite  Result Date: 09/01/2018 If Site Rite image not attached, placement could not be confirmed due to current cardiac rhythm.    LOS: 9 days   Oren Binet, MD  [facility]  If 7PM-7AM, please contact night-coverage  Please page via [email or web address] [email or web address]-click on MD name and type text message  09/06/2018, 11:00 AM

## 2018-09-06 NOTE — Progress Notes (Signed)
Pt feeling very nauseated. Given prn zofran with no improvement. Feeling like he will vomit, but has not. Abodmen is distended and taut. Notified Schorr, NP. Will continue to monitor.

## 2018-09-06 NOTE — Progress Notes (Signed)
PHARMACY - ADULT TOTAL PARENTERAL NUTRITION CONSULT NOTE   Pharmacy Consult:  TPN Indication:  SBO  Patient Measurements: Height: 6' (182.9 cm) Weight: 211 lb 10.3 oz (96 kg) IBW/kg (Calculated) : 77.6 TPN AdjBW (KG): 96 Body mass index is 28.7 kg/m.  Usual body weight = 91 kg  Assessment:  77 YOM presented on 08/28/18 with abdominal pain and SOB.  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  Also with h/o ruptured appendicitis requiring ex-lap and his hernia is enlarging with chronic cough. CT showed SBO with gallbladder stones.  Patient has been NPO for 5 days with limited PO intake since 08/24/18 per documentation.  MD concerned patient will not improve without surgical intervention; therefore, Pharmacy consulted to initiate TPN.  Patient is at risk for refeeding.  PMH: HTN, ETOH, prostate cancer, mouth cancer, CVA  GI: Enlarging hernia, SBO. Baseline prealbumin low at 11.4, LBM 12/3, +flatus.  Prealbumin lower today at 9.6. Albumin low at 2.5. S/p OR for ex lap and lysis of adhesions on 12/4. NG output minimal yesterday. Patient now with nausea this am and feels like vomiting. Abdomen distended and taut. Endo: no hx DM: CBGs < 150 Insulin requirements in the past 24 hours: 1 unit Lytes: wnl today. K trending up to 4.8. Renal: SCr mostly stable. BUN up to 28 - UOP low, net ~+3L. NS at 69ml/hr Pulm:  Duonebs Cards: HTN  Hepatobil: LFTs / tbili / TG WNL 133. Neuro: h/o CVA ID: r/o aspiration, on CTX (12/26 >> ), off azith for PNA - afebrile, WBC 20, BCx negat  TPN Access: PICC placed 09/01/18  TPN start date: 09/02/18 Nutritional Goals (RD rec on 12/3): 2100-2300 kCal  110-125gm protein per day 2.1-2.3 L per day  Current Nutrition:  NPO TPN  Plan:  Continue TPN at 70ml/hr This TPN provides 120 g of protein, 306 g of dextrose, and 65 g of lipids for a total of 2,176 kcals meeting 100% of patient needs Electrolytes in TPN: standard  concentration exc decrease K. Cl:Ac 1:1 Add MVI, trace elements to TPN Stop thiamine supplementation now that at goal Continue sensitive SSI and adjust as needed Continue NS at 32ml/hr per MD Monitor TPN labs, Bmet tomorrow   Elenor Quinones, PharmD, BCPS, [facility] Clinical Pharmacist Phone number [phone] 09/06/2018 8:26 AM

## 2018-09-06 NOTE — Progress Notes (Signed)
Physical Therapy Treatment Patient Details Name: Luis Lyons MRN: [id number] DOB: 05-07-1941 Today's Date: 09/06/2018    History of Present Illness Pt is a 77 y/o male admitted secondary to abdominal pain and SOB. Pt found to have a SBO and had an NG tube placed. PMH including but not limited to CVA with L sided weakness, prostate cancer and mouth cancer.     PT Comments    Pt agreeable to participate.  Pt asked to get back in bed prior to therapist leaving and as a compromise this therapist got pt to sit in the recliner for about 40 min for coming back to work on getting back to bed.    Follow Up Recommendations  SNF     Equipment Recommendations  None recommended by PT    Recommendations for Other Services       Precautions / Restrictions Precautions Precautions: Fall Precaution Comments: NG tube    Mobility  Bed Mobility Overal bed mobility: Needs Assistance Bed Mobility: Rolling;Sidelying to Sit Rolling: Max assist;+2 for physical assistance Sidelying to sit: Max assist;+2 for physical assistance       General bed mobility comments: cues for sequencing and technique to keep pain lower  Transfers Overall transfer level: Needs assistance   Transfers: Stand Pivot Transfers   Stand pivot transfers: Max assist;+2 physical assistance       General transfer comment: cues for hand placement.  Face to face assist to help pt forward and pivot.  Ambulation/Gait             General Gait Details: N/A   Stairs             Wheelchair Mobility    Modified Rankin (Stroke Patients Only)       Balance Overall balance assessment: Needs assistance Sitting-balance support: Single extremity supported;Bilateral upper extremity supported Sitting balance-Leahy Scale: Poor Sitting balance - Comments: guard assist with bil Ue assist     Standing balance-Leahy Scale: Poor Standing balance comment: Significant lift and stability assist                             Cognition Arousal/Alertness: Awake/alert Behavior During Therapy: Flat affect Overall Cognitive Status: Impaired/Different from baseline(NT formally)                         Following Commands: Follows one step commands with increased time     Problem Solving: Slow processing;Decreased initiation;Requires verbal cues;Difficulty sequencing;Requires tactile cues        Exercises Other Exercises Other Exercises: Bil LE ROM warm up prior to mobility    General Comments        Pertinent Vitals/Pain Pain Assessment: Faces Faces Pain Scale: Hurts even more Pain Location: stomach Pain Descriptors / Indicators: Grimacing;Guarding Pain Intervention(s): Monitored during session;Repositioned    Home Living                      Prior Function            PT Goals (current goals can now be found in the care plan section) Acute Rehab PT Goals Patient Stated Goal: decrease pain PT Goal Formulation: With patient Time For Goal Achievement: 09/14/18 Potential to Achieve Goals: Fair Progress towards PT goals: Progressing toward goals    Frequency    Min 2X/week      PT Plan Current plan remains appropriate    Co-evaluation  AM-PAC PT "6 Clicks" Mobility   Outcome Measure  Help needed turning from your back to your side while in a flat bed without using bedrails?: A Lot Help needed moving from lying on your back to sitting on the side of a flat bed without using bedrails?: A Lot Help needed moving to and from a bed to a chair (including a wheelchair)?: A Lot Help needed standing up from a chair using your arms (e.g., wheelchair or bedside chair)?: A Lot Help needed to walk in hospital room?: Total Help needed climbing 3-5 steps with a railing? : Total 6 Click Score: 10    End of Session Equipment Utilized During Treatment: Oxygen Activity Tolerance: Patient limited by pain Patient left: in chair;with call bell/phone  within reach;with chair alarm set;with family/visitor present Nurse Communication: Mobility status PT Visit Diagnosis: Other abnormalities of gait and mobility (R26.89);Pain Pain - part of body: (abdomen)     Time: 0160-1093 PT Time Calculation (min) (ACUTE ONLY): 21 min  Charges:  $Therapeutic Activity: 8-22 mins                     09/06/2018  Donnella Sham, PT Acute Rehabilitation Services [phone]  (pager) [phone]  (office)   Luis Lyons 09/06/2018, 2:08 PM

## 2018-09-07 LAB — BASIC METABOLIC PANEL
Anion gap: 8 (ref 5–15)
BUN: 25 mg/dL — ABNORMAL HIGH (ref 8–23)
CO2: 22 mmol/L (ref 22–32)
Calcium: 8.7 mg/dL — ABNORMAL LOW (ref 8.9–10.3)
Chloride: 106 mmol/L (ref 98–111)
Creatinine, Ser: 1.06 mg/dL (ref 0.61–1.24)
GFR calc Af Amer: >60 mL/min (ref >60)
GFR calc non Af Amer: >60 mL/min (ref >60)
Glucose, Bld: 128 mg/dL — ABNORMAL HIGH (ref 70–99)
Potassium: 4.4 mmol/L (ref 3.5–5.1)
Sodium: 136 mmol/L (ref 135–145)

## 2018-09-07 LAB — CBC
HCT: 35.5 % — ABNORMAL LOW (ref 39.0–52.0)
Hemoglobin: 11.1 g/dL — ABNORMAL LOW (ref 13.0–17.0)
MCH: 28.1 pg (ref 26.0–34.0)
MCHC: 31.3 g/dL (ref 30.0–36.0)
MCV: 89.9 fL (ref 80.0–100.0)
Platelets: 260 10*3/uL (ref 150–400)
RBC: 3.95 MIL/uL — ABNORMAL LOW (ref 4.22–5.81)
RDW: 13.5 % (ref 11.5–15.5)
WBC: 14.7 10*3/uL — ABNORMAL HIGH (ref 4.0–10.5)
nRBC: 0 % (ref 0.0–0.2)

## 2018-09-07 LAB — GLUCOSE, CAPILLARY
Glucose-Capillary: 116 mg/dL — ABNORMAL HIGH (ref 70–99)
Glucose-Capillary: 121 mg/dL — ABNORMAL HIGH (ref 70–99)
Glucose-Capillary: 135 mg/dL — ABNORMAL HIGH (ref 70–99)

## 2018-09-07 MED ORDER — OXYCODONE HCL 5 MG PO TABS
5.0000 mg | ORAL_TABLET | ORAL | Status: DC | PRN
  Administered 2018-09-10 – 2018-09-11 (×2): 5 mg via ORAL
  Filled 2018-09-07 (×2): qty 1

## 2018-09-07 MED ORDER — ACETAMINOPHEN 10 MG/ML IV SOLN
1000.0000 mg | Freq: Four times a day (QID) | INTRAVENOUS | Status: AC
  Administered 2018-09-07 – 2018-09-08 (×4): 1000 mg via INTRAVENOUS
  Filled 2018-09-07 (×4): qty 100

## 2018-09-07 MED ORDER — TRAVASOL 10 % IV SOLN
INTRAVENOUS | Status: AC
  Administered 2018-09-07: 18:00:00 via INTRAVENOUS
  Filled 2018-09-07: qty 1203.6

## 2018-09-07 MED ORDER — FUROSEMIDE 10 MG/ML IJ SOLN
40.0000 mg | Freq: Once | INTRAMUSCULAR | Status: AC
  Administered 2018-09-07: 40 mg via INTRAVENOUS
  Filled 2018-09-07: qty 4

## 2018-09-07 MED ORDER — PANTOPRAZOLE SODIUM 40 MG IV SOLR
40.0000 mg | INTRAVENOUS | Status: DC
  Administered 2018-09-07 – 2018-09-13 (×7): 40 mg via INTRAVENOUS
  Filled 2018-09-07 (×7): qty 40

## 2018-09-07 NOTE — Progress Notes (Signed)
Discussed PIV status w Clarise Cruz, RN. Pt has one PIV infusing and one unused lumen on PICC. Recommended discontinuing PIV and using PICC for all IV administration. Clarise Cruz voiced understanding.

## 2018-09-07 NOTE — Progress Notes (Addendum)
PROGRESS NOTE        PATIENT DETAILS Name: Luis Lyons Age: 77 y.o. Sex: male Date of Birth: 11/21/1940 Admit Date: 08/28/2018 Admitting Physician Luis Patience, MD NGE:[id number], Luis Brow, MD  Brief Narrative: 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 Upon further evaluation he was found to have pneumonia and a small bowel obstruction.  Unfortunately patient's obstruction was persistent even with conservative management and underwent laparotomy with lysis of adhesions on 12/4.   Subjective: No major issues overnight-no flatus or BM yet.  Some wheezing this morning.  Assessment/Plan: SBO: Underwent exploratory laparotomy and lysis of adhesions on 12/4.  Appears to have a mild ileus-no BM or flatus as of yet.  Continue NG tube decompression-n.p.o. status.  General surgery following and directing care.  Remains on TNA through a PICC line.  Leukocytosis: Likely reactive-from recent surgery and ongoing ileus.  Afebrile.  Downtrending-continue to monitor off antimicrobial therapy.  Recent chest x-ray on 12/1 showed complete resolution of his pneumonia.  Continue to follow.  Pneumonia: He has completed a course of antimicrobial therapy-thought to have aspiration pneumonia in the setting of vomiting.  Chest x-ray on 12/1 had showed resolution of pneumonia.  He however remains at high risk for aspiration-and needs aggressive pulmonary toileting-continue incentive spirometry/flutter valve.  AKI: Soft-likely hemodynamically mediated.  Follow electrolytes periodically.   History of CVA with chronic left-sided weakness: Remains at baseline-plans are to resume aspirin once oral intake has been resumed.  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  Antimicrobial agents: Anti-infectives (From admission, onward)   Start     Dose/Rate Route Frequency Ordered Stop   09/05/18 1100  ceFAZolin (ANCEF) IVPB 2g/100 mL premix     2 g 200 mL/hr over 30 Minutes Intravenous  Once 09/05/18 1054 09/05/18 1111   09/05/18 1054  ceFAZolin (ANCEF) 2-4 GM/100ML-% IVPB    Note to Pharmacy:  Luis Lyons   : cabinet override      09/05/18 1054 09/05/18 1111   08/28/18 2230  cefTRIAXone (ROCEPHIN) 2 g in sodium chloride 0.9 % 100 mL IVPB     2 g 200 mL/hr over 30 Minutes Intravenous Every 24 hours 08/28/18 2215 09/03/18 2232   08/28/18 2230  azithromycin (ZITHROMAX) 500 mg in sodium chloride 0.9 % 250 mL IVPB     500 mg 250 mL/hr over 60 Minutes Intravenous Every 24 hours 08/28/18 2215 09/01/18 2229      Procedures: 12/4>> laparotomy with lysis of adhesions 11/30>>PICC  CONSULTS:  general surgery  Time spent: 25- minutes-Greater than 50% of this time was spent in counseling, explanation of diagnosis, planning of further management, and coordination of care.  MEDICATIONS: Scheduled Meds: . enoxaparin (LOVENOX) injection  40 mg Subcutaneous Q24H  . furosemide  40 mg Intravenous Once  . insulin aspart  0-9 Units Subcutaneous Q6H  . ipratropium-albuterol  3 mL Nebulization BID  . mouth rinse  15  mL Mouth Rinse BID  . pantoprazole (PROTONIX) IV  40 mg Intravenous Q24H  . polyvinyl alcohol  1 drop Both Eyes BID   Continuous Infusions: . acetaminophen 1,000 mg (09/07/18 1052)  . TPN ADULT (ION) 85 mL/hr at 09/06/18 1700  . TPN ADULT (ION)     PRN Meds:.albuterol, hydrALAZINE, HYDROmorphone  (DILAUDID) injection, menthol-cetylpyridinium, ondansetron **OR** ondansetron (ZOFRAN) IV, oxyCODONE, sodium chloride flush   PHYSICAL EXAM: Vital signs: Vitals:   09/06/18 1933 09/06/18 2128 09/07/18 0640 09/07/18 0855  BP:  (!) 168/60 (!) 169/76   Pulse:  93 98   Resp:  16 18   Temp:  99.6 F (37.6 C) 99.2 F (37.3 C)   TempSrc:  Oral Oral   SpO2: 95% 93% 100% 93%  Weight:      Height:       Filed Weights   09/05/18 0500 09/05/18 1041 09/06/18 0500  Weight: 93.2 kg 93.2 kg 96 kg   Body mass index is 28.7 kg/m.   General appearance:Awake, alert, not in any distress.  Eyes:no scleral icterus. HEENT: Atraumatic and Normocephalic Neck: supple, no JVD. Resp:Good air entry bilaterally, scattered rhonchi-transmitted upper airway sounds. CVS: S1 S2 regular, no murmurs.  GI: Bowel sounds very sluggish-tender appropriately at the operative site-slightly distended.  No peritoneal signs.   Extremities: B/L Lower Ext shows trace edema, both legs are warm to touch Neurology:  Non focal Psychiatric: Normal judgment and insight. Normal mood. Musculoskeletal:No digital cyanosis Skin:No Rash, warm and dry Wounds:N/A  I have personally reviewed following labs and imaging studies  LABORATORY DATA: CBC: Recent Labs  Lab 09/01/18 0312 09/02/18 0435 09/03/18 0356 09/06/18 0414 09/07/18 0915  WBC 9.9 11.5* 10.5 20.0* 14.7*  NEUTROABS  --  8.7* 8.0*  --   --   HGB 11.2* 11.7* 10.5* 11.5* 11.1*  HCT 37.9* 39.0 33.7* 37.6* 35.5*  MCV 91.5 91.1 90.6 90.6 89.9  PLT 309 338 274 314 161    Basic Metabolic Panel: Recent Labs  Lab 09/02/18 0435 09/03/18 0356 09/04/18 0404 09/05/18 0405 09/06/18 0414 09/07/18 0336  NA 143 140 138 137 135 136  K 3.6 3.8 4.0 4.6 4.8 4.4  CL 108 110 108 106 106 106  CO2 23 28 23 23  21* 22  GLUCOSE 92 114* 127* 119* 134* 128*  BUN 25* 21 15 19  28* 25*  CREATININE 1.21 1.11 0.91 1.10 0.96 1.06  CALCIUM 8.6* 8.2* 8.5* 9.0 8.9 8.7*  MG 2.3 2.1 2.2   --  2.3  --   PHOS 2.8 2.8 2.9  --  3.7  --     GFR: Estimated Creatinine Clearance: 71.3 mL/min (by C-G formula based on SCr of 1.06 mg/dL).  Liver Function Tests: Recent Labs  Lab 09/02/18 0435 09/03/18 0356 09/06/18 0414  AST 15 15 39  ALT 12 14 32  ALKPHOS 49 45 54  BILITOT 0.7 0.5 0.4  PROT 7.1 6.1* 6.8  ALBUMIN 2.7* 2.4* 2.5*   No results for input(s): LIPASE, AMYLASE in the last 168 hours. No results for input(s): AMMONIA in the last 168 hours.  Coagulation Profile: No results for input(s): INR, PROTIME in the last 168 hours.  Cardiac Enzymes: No results for input(s): CKTOTAL, CKMB, CKMBINDEX, TROPONINI in the last 168 hours.  BNP (last 3 results) No results for input(s): PROBNP in the last 8760 hours.  HbA1C: No results for input(s): HGBA1C in the last 72 hours.  CBG: Recent Labs  Lab 09/06/18 0036 09/06/18 0552 09/06/18 1209 09/06/18  1827 09/07/18 0030  GLUCAP 144* 105* 113* 111* 116*    Lipid Profile: No results for input(s): CHOL, HDL, LDLCALC, TRIG, CHOLHDL, LDLDIRECT in the last 72 hours.  Thyroid Function Tests: No results for input(s): TSH, T4TOTAL, FREET4, T3FREE, THYROIDAB in the last 72 hours.  Anemia Panel: No results for input(s): VITAMINB12, FOLATE, FERRITIN, TIBC, IRON, RETICCTPCT in the last 72 hours.  Urine analysis:    Component Value Date/Time   COLORURINE YELLOW 08/30/2018 1245   APPEARANCEUR HAZY (A) 08/30/2018 1245   LABSPEC 1.027 08/30/2018 1245   PHURINE 5.0 08/30/2018 1245   GLUCOSEU NEGATIVE 08/30/2018 1245   HGBUR SMALL (A) 08/30/2018 1245   BILIRUBINUR NEGATIVE 08/30/2018 1245   KETONESUR 5 (A) 08/30/2018 1245   PROTEINUR 30 (A) 08/30/2018 1245   NITRITE POSITIVE (A) 08/30/2018 1245   LEUKOCYTESUR MODERATE (A) 08/30/2018 1245    Sepsis Labs: Lactic Acid, Venous    Component Value Date/Time   LATICACIDVEN 1.86 08/28/2018 2153    MICROBIOLOGY: Recent Results (from the past 240 hour(s))  Blood Culture  (routine x 2)     Status: None   Collection Time: 08/28/18  9:50 PM  Result Value Ref Range Status   Specimen Description BLOOD LEFT FOREARM  Final   Special Requests   Final    BOTTLES DRAWN AEROBIC AND ANAEROBIC Blood Culture adequate volume   Culture   Final    NO GROWTH 5 DAYS Performed at [facility], [place] [address], [place], [place] [address]    Report Status 09/02/2018 FINAL  Final  Blood Culture (routine x 2)     Status: None   Collection Time: 08/28/18 10:00 PM  Result Value Ref Range Status   Specimen Description BLOOD RIGHT HAND  Final   Special Requests   Final    BOTTLES DRAWN AEROBIC AND ANAEROBIC Blood Culture adequate volume   Culture   Final    NO GROWTH 5 DAYS Performed at [facility], [place] [address], [place], [place] [address]    Report Status 09/02/2018 FINAL  Final  MRSA PCR Screening     Status: None   Collection Time: 08/29/18  2:00 AM  Result Value Ref Range Status   MRSA by PCR NEGATIVE NEGATIVE Final    Comment:        The GeneXpert MRSA Assay (FDA approved for NASAL specimens only), is one component of a comprehensive MRSA colonization surveillance program. It is not intended to diagnose MRSA infection nor to guide or monitor treatment for MRSA infections. Performed at [facility], [place] [address] [place], [place] [address]   Surgical pcr screen     Status: None   Collection Time: 09/05/18  9:54 AM  Result Value Ref Range Status   MRSA, PCR NEGATIVE NEGATIVE Final   Staphylococcus aureus NEGATIVE NEGATIVE Final    Comment: (NOTE) The Xpert SA Assay (FDA approved for NASAL specimens in patients 77 years of age and older), is one component of a comprehensive surveillance program. It is not intended to diagnose infection nor to guide or monitor treatment. Performed at [facility], [place] [address] [place], [place] [address]     RADIOLOGY STUDIES/RESULTS: Dg Chest 1 View  Result Date: 09/02/2018 CLINICAL DATA:   Wheezing. EXAM: CHEST  1 VIEW COMPARISON:  09/02/2018 FINDINGS: Right arm PICC line tip is in the cavoatrial junction. There is a feeding tube in place. Heart size normal. No pleural effusion or edema. No airspace opacities  identified. IMPRESSION: No acute cardiopulmonary abnormalities. Electronically Signed   By: Kerby Moors M.D.   On: 09/02/2018 15:46   Dg Abd 1 View  Result Date: 09/06/2018 CLINICAL DATA:  Nasogastric tube placement. EXAM: ABDOMEN - 1 VIEW COMPARISON:  09/05/2018. FINDINGS: NG tube noted with tip stomach. No bowel distention noted. Degenerative change thoracolumbar spine IMPRESSION: NG tube noted with tip in stomach. Electronically Signed   By: Marcello Moores  Register   On: 09/06/2018 12:40   X-ray Abdomen Ap  Result Date: 09/05/2018 CLINICAL DATA:  Encounter for NG tube placement. EXAM: ABDOMEN - 1 VIEW COMPARISON:  None. FINDINGS: The radiograph is suboptimal, underpenetrated and includes overlapping shadows of the patient's RIGHT hand. A tube projects over the lower thoracic region, with its tip likely in the distal esophagus, but not in the stomach. Nonobstructive gas pattern. Surgical clips overlie the RIGHT abdomen. IMPRESSION: Suboptimal radiograph. Nasogastric tube is not in the stomach, with tip appearing to project over distal esophagus. Recommend repositioning the tube, with repeat imaging. Electronically Signed   By: Staci Righter M.D.   On: 09/05/2018 15:12   Dg Abd 1 View  Result Date: 09/03/2018 CLINICAL DATA:  Small-bowel obstruction.  NG tube. EXAM: ABDOMEN - 1 VIEW COMPARISON:  Abdominal radiographic series-09/02/2018; 09/01/2018; 08/29/2018; CT abdomen pelvis-11/26/fluoroscopic guided nasogastric tube placement-09/02/2018 FINDINGS: There is been apparent exchange of existing weighted tip feeding tube with new enteric tube tip overlying the expected location of the descending portion of the duodenum. Re demonstrated moderate gaseous distension of several rather patulous  loops of small bowel, unchanged to slightly improved compared to the 09/02/2018 examination with index loop of small bowel in the right mid/lower abdomen measuring 5.6 cm in diameter. Nondiagnostic evaluation for pneumoperitoneum secondary to supine positioning exclusion of the lower thorax. Stigmata of DISH throughout the thoracic spine. Post ORIF of the left femur and femoral neck. IMPRESSION: 1. No change to slight improvement in findings again concerning for small bowel obstruction. 2. Apparent change of enteric tube with new enteric tube tip terminating over the expected location of the descending portion duodenum. Electronically Signed   By: Sandi Mariscal M.D.   On: 09/03/2018 07:46   Dg Abd 1 View  Result Date: 09/01/2018 CLINICAL DATA:  SBO, upright image requested by MD. EXAM: ABDOMEN - 1 VIEW COMPARISON:  08/30/2018 FINDINGS: Nasogastric tube has been advanced into the proximal duodenum. The stomach appears decompressed. There are multiple gas distended small bowel loops identified in the upper abdomen. The lower abdomen is excluded. Visualized portions of colon are nondilated. There is no convincing free air. Visualized lung bases unremarkable. IMPRESSION: 1. Nasogastric tube advancement into the proximal duodenum, with persistent small bowel distention. Electronically Signed   By: Lucrezia Europe M.D.   On: 09/01/2018 07:57   Dg Abd 1 View  Result Date: 08/30/2018 CLINICAL DATA:  Small bowel obstruction EXAM: ABDOMEN - 1 VIEW COMPARISON:  August 29, 2018 FINDINGS: The NG tube terminates in the proximal stomach. Small bowel dilatation is less prominent on this study but remains. No other acute abnormalities. IMPRESSION: The small bowel dilatation is less prominent but remains consistent with persistent small-bowel obstruction. The enteric tube appears to terminate in the proximal stomach. Electronically Signed   By: Dorise Bullion III M.D   On: 08/30/2018 13:22   Dg Abd 1 View  Result Date:  08/29/2018 CLINICAL DATA:  Replacement of NG tube. EXAM: ABDOMEN - 1 VIEW COMPARISON:  One-view abdomen 08/29/2018 at 8:46 a.m. FINDINGS: Side port of  the NG tube is beyond the GE junction. Mildly dilated loops of small bowel are similar the prior exam. No free air is present. IMPRESSION: 1. Side port of the NG tube is beyond the GE junction. 2. Similar appearance of multiple dilated loops of small bowel without evidence of free air. Electronically Signed   By: San Morelle M.D.   On: 08/29/2018 13:08   Dg Abd 1 View  Result Date: 08/29/2018 CLINICAL DATA:  Check nasogastric catheter placement EXAM: ABDOMEN - 1 VIEW COMPARISON:  None. FINDINGS: 30 seconds of fluoroscopy was utilized for placement. The nasogastric catheter is shown coiled within the gastric lumen. IMPRESSION: Nasogastric catheter placed in the stomach. Electronically Signed   By: Inez Catalina M.D.   On: 08/29/2018 09:08   Ct Abdomen Pelvis W Contrast  Result Date: 08/28/2018 CLINICAL DATA:  Abdominal pain with shortness of breath EXAM: CT ABDOMEN AND PELVIS WITH CONTRAST TECHNIQUE: Multidetector CT imaging of the abdomen and pelvis was performed using the standard protocol following bolus administration of intravenous contrast. CONTRAST:  130mL OMNIPAQUE IOHEXOL 300 MG/ML  SOLN COMPARISON:  None. FINDINGS: LOWER CHEST: There is no basilar pleural or apical pericardial effusion. HEPATOBILIARY: The hepatic contours and density are normal. There is no intra- or extrahepatic biliary dilatation. There is cholelithiasis without acute inflammation. PANCREAS: The pancreatic parenchymal contours are normal and there is no ductal dilatation. There is no peripancreatic fluid collection. SPLEEN: Normal. ADRENALS/URINARY TRACT: --Adrenal glands: Normal. --Right kidney/ureter: Mild atrophy. No hydronephrosis. --Left kidney/ureter: Vascular calcifications and interpolar nonobstructive 3 mm nephrolithiasis. No hydronephrosis. --Urinary bladder:  Mild bladder wall thickening, possibly due to underdistention. STOMACH/BOWEL: --Stomach/Duodenum: There is no hiatal hernia or other gastric abnormality. The duodenal course and caliber are normal. --Small bowel: There are multiple loops of dilated small bowel in the central abdomen, predominantly anteriorly. One of these loops is directly beneath the skin surface, extending through a diastatic defect of the anterior abdominal muscles. Suspected transition point is in the anterior low midline abdomen (coronal image 24 and sagittal image 70). --Colon: No focal abnormality. --Appendix: Not visualized. No right lower quadrant inflammation or free fluid. VASCULAR/LYMPHATIC: Atherosclerotic calcification is present within the non-aneurysmal abdominal aorta, without hemodynamically significant stenosis. The portal vein, splenic vein, superior mesenteric vein and IVC are patent. No abdominal or pelvic lymphadenopathy. REPRODUCTIVE: Normal prostate size with symmetric seminal vesicles. MUSCULOSKELETAL. Compression deformity of L2 is probably chronic. There is grade 1 L4-5 anterolisthesis secondary to facet arthrosis. OTHER: None. IMPRESSION: 1. Small bowel obstruction with suspected transition point in the low anterior midline abdomen. 2. Cholelithiasis without acute inflammation. 3. Nonobstructing left nephrolithiasis. Aortic Atherosclerosis (ICD10-I70.0). Electronically Signed   By: Ulyses Jarred M.D.   On: 08/28/2018 23:21   Dg Chest Port 1 View  Result Date: 08/28/2018 CLINICAL DATA:  77 y/o  M; abdominal pain and shortness of breath. EXAM: PORTABLE CHEST 1 VIEW COMPARISON:  None. FINDINGS: Normal cardiac silhouette. Aortic atherosclerosis with calcification. Perihilar and basilar reticular opacities. No consolidation. No pleural effusion or pneumothorax. No acute osseous abnormality is evident. IMPRESSION: Basilar reticular opacities may represent mild interstitial edema or atypical pneumonia. No consolidation.  Electronically Signed   By: Kristine Garbe M.D.   On: 08/28/2018 21:55   Dg Abd Portable 1v  Result Date: 09/05/2018 CLINICAL DATA:  Ongoing abdomen pain and distention. EXAM: PORTABLE ABDOMEN - 1 VIEW COMPARISON:  09/04/2018. FINDINGS: Nasogastric tube has been removed. There has been persistence of and perhaps mild progression of dilated small bowel loops  along with inspissated contrast throughout a distended colon. IMPRESSION: Slight progression of dilated small bowel loops along with inspissated contrast throughout a distended colon. Findings remain consistent with a partial small bowel obstruction. Electronically Signed   By: Staci Righter M.D.   On: 09/05/2018 10:43   Dg Abd Portable 1v  Result Date: 09/04/2018 CLINICAL DATA:  Follow up small bowel obstruction EXAM: PORTABLE ABDOMEN - 1 VIEW COMPARISON:  09/03/2018 FINDINGS: Nasogastric catheter is noted in the second portion of the duodenum stable from the prior exam. Scattered large and small bowel gas is noted. There is been a slight decrease in the degree of small bowel dilatation when compared with the previous exam. No free air is noted. Postsurgical changes in the left hip are again seen. IMPRESSION: Improvement in the degree of small bowel dilatation when compared with the previous day. Continued follow-up is recommended. Electronically Signed   By: Inez Catalina M.D.   On: 09/04/2018 08:46   Dg Abd Portable 1v  Result Date: 08/29/2018 CLINICAL DATA:  Small bowel obstruction, 8 hour delayed film after Gastrografin EXAM: PORTABLE ABDOMEN - 1 VIEW COMPARISON:  Abdominal radiograph from earlier today FINDINGS: Enteric tube terminates in the proximal stomach. Moderately dilated small bowel loops throughout the abdomen are not appreciably changed. Oral contrast transits to the mid small bowel. No definite oral contrast in the colon. Moderate colonic stool. No evidence of pneumatosis or pneumoperitoneum. Clear lung bases. IMPRESSION: 1.  Enteric tube terminates in the stomach. 2. Stable moderately dilated small bowel loops compatible with small-bowel obstruction. Oral contrast transits to the mid small bowel, with no definite oral contrast in the colon. Electronically Signed   By: Ilona Sorrel M.D.   On: 08/29/2018 21:38   Dg Abd Portable 1v-small Bowel Obstruction Protocol-initial, 8 Hr Delay  Result Date: 08/29/2018 CLINICAL DATA:  Follow up small bowel obstruction EXAM: PORTABLE ABDOMEN - 1 VIEW COMPARISON:  08/28/2018 FINDINGS: Nasogastric catheter is now noted within the stomach. Fecal material is noted throughout the colon. Contrast is seen within the bladder from recent CT examination. Multiple dilated small bowel loops are again identified in the mid abdomen stable from the recent exam. No acute bony abnormality is noted. Prior L2 compression deformity is noted. IMPRESSION: Stable small bowel dilatation following nasogastric catheter placement Electronically Signed   By: Inez Catalina M.D.   On: 08/29/2018 09:09   Dg Abd Portable 2v  Result Date: 09/02/2018 CLINICAL DATA:  Initial evaluation for possible foreign body, missing piece of NG tube. EXAM: PORTABLE ABDOMEN - 2 VIEW COMPARISON:  Prior radiograph from 09/01/2018. FINDINGS: Previously seen enteric tube has been removed. No visible tube fragment or other radiopaque foreign body identified. Persistent small bowel dilatation noted within the upper abdomen, suggestive of ongoing small bowel obstruction. This appears improved from previous. Partially visualized lung bases are grossly clear. IMPRESSION: 1. No visible tube fragment or other radiopaque foreign body identified. 2. Persistent but improved small bowel obstruction. Electronically Signed   By: Jeannine Boga M.D.   On: 09/02/2018 05:52   Dg Addison Bailey G Tube Plc W/fl W/rad  Result Date: 09/03/2018 CLINICAL DATA:  Were acquires nasogastric tube placement. EXAM: NASO G TUBE PLACEMENT WITH FL AND WITH RAD CONTRAST:   Water-soluble contrast FLUOROSCOPY TIME:  Fluoroscopy Time:  1 minutes 12 seconds Radiation Exposure Index (if provided by the fluoroscopic device): Number of Acquired Spot Images: 2 COMPARISON:  Earlier today FINDINGS: Patient's indwelling weighted feeding tube was removed. A nasogastric tube was then  placed through the patient's right nares and under direct fluoroscopic guidance was manipulated into the stomach. The tube was in extended into the duodenum. A small volume of water-soluble contrast material was injected into the nasogastric tube in the tip terminates at the distal portion of the descending duodenum. IMPRESSION: Removal of indwelling weighted feeding tube with subsequent fluoroscopic guided placement of nasogastric tube into the proximal duodenum. Contrast injected into the nasogastric tube confirms tip location at the distal portion of the descending duodenum. Electronically Signed   By: Kerby Moors M.D.   On: 09/03/2018 12:51   Dg Addison Bailey G Tube Plc W/fl W/rad  Result Date: 09/02/2018 CLINICAL DATA:  NG tube placement. EXAM: NASO G TUBE PLACEMENT WITH FL AND WITH RAD CONTRAST:  Water-soluble contrast FLUOROSCOPY TIME:  Fluoroscopy Time:  5 minutes 48 seconds Radiation Exposure Index (if provided by the fluoroscopic device): Number of Acquired Spot Images: 2 COMPARISON:  09/02/2018 FINDINGS: Under direct fluoroscopic guidance a weighted nasogastric tube was placed into the right nares. The tube was then guided into the esophagus, past the GE junction and into the gastric lumen. The tube was then manipulated into the third portion of the duodenum. A small volume of water-soluble contrast material was injected into the tube confirming placement of the tube at the level of the duodenal jejunal junction. IMPRESSION: 1. Successful fluoroscopic guided weighted nasogastric tube placement. The tip is at the level of the duodenal jejunal junction and is ready for use. Electronically Signed   By: Kerby Moors M.D.   On: 09/02/2018 11:43   [place] Ekg Site Rite  Result Date: 09/01/2018 If Site Rite image not attached, placement could not be confirmed due to current cardiac rhythm.    LOS: 10 days   Oren Binet, MD  [facility]  If 7PM-7AM, please contact night-coverage  Please page via [email or web address]-click on MD name and type text message  09/07/2018, 11:21 AM

## 2018-09-07 NOTE — Progress Notes (Signed)
Physical Therapy Treatment Patient Details Name: Luis Lyons MRN: [id number] DOB: [birth year] Today's Date: 09/07/2018    History of Present Illness Pt is a 77 y/o male admitted secondary to abdominal pain and SOB. Pt found to have a SBO and had an NG tube placed. Pt is now S/P ex lap with LOA, BT, 12/04. PMH including but not limited to CVA with L sided weakness, prostate cancer and mouth cancer.     PT Comments    Pt very limited this session secondary to pain and weakness. Pt actively pushing and resisting upright sitting EOB, only tolerating ~30 seconds prior to needing to return to supine. Pt would continue to benefit from skilled physical therapy services at this time while admitted and after d/c to address the below listed limitations in order to improve overall safety and independence with functional mobility.    Follow Up Recommendations  SNF     Equipment Recommendations  None recommended by PT    Recommendations for Other Services       Precautions / Restrictions Precautions Precautions: Fall Precaution Comments: NG tube Restrictions Weight Bearing Restrictions: No    Mobility  Bed Mobility Overal bed mobility: Needs Assistance Bed Mobility: Rolling;Sidelying to Sit;Sit to Sidelying Rolling: Max assist;+2 for physical assistance Sidelying to sit: Max assist;+2 for physical assistance     Sit to sidelying: Max assist;+2 for physical assistance General bed mobility comments: increased time and effort, cueing for sequencing, assistance with bilateral LE movement off of and back onto bed, assistance with trunk management as well  Transfers                 General transfer comment: unable to perform this session  Ambulation/Gait                 Stairs             Wheelchair Mobility    Modified Rankin (Stroke Patients Only)       Balance Overall balance assessment: Needs assistance Sitting-balance support: Bilateral upper extremity  supported;Single extremity supported Sitting balance-Leahy Scale: Poor Sitting balance - Comments: max A x2 with pt actively pushing and resisting                                    Cognition Arousal/Alertness: Awake/alert Behavior During Therapy: Flat affect Overall Cognitive Status: Impaired/Different from baseline Area of Impairment: Following commands;Safety/judgement;Problem solving                       Following Commands: Follows one step commands with increased time Safety/Judgement: Decreased awareness of deficits;Decreased awareness of safety   Problem Solving: Slow processing;Decreased initiation;Requires verbal cues;Difficulty sequencing;Requires tactile cues        Exercises      General Comments        Pertinent Vitals/Pain Pain Assessment: Faces Faces Pain Scale: Hurts whole lot Pain Location: stomach Pain Descriptors / Indicators: Grimacing;Guarding Pain Intervention(s): Monitored during session;Repositioned    Home Living                      Prior Function            PT Goals (current goals can now be found in the care plan section) Acute Rehab PT Goals PT Goal Formulation: With patient Time For Goal Achievement: 09/14/18 Potential to Achieve Goals: Fair Progress towards PT goals: Progressing toward  goals    Frequency    Min 2X/week      PT Plan Current plan remains appropriate    Co-evaluation              AM-PAC PT "6 Clicks" Mobility   Outcome Measure  Help needed turning from your back to your side while in a flat bed without using bedrails?: A Lot Help needed moving from lying on your back to sitting on the side of a flat bed without using bedrails?: A Lot Help needed moving to and from a bed to a chair (including a wheelchair)?: A Lot Help needed standing up from a chair using your arms (e.g., wheelchair or bedside chair)?: A Lot Help needed to walk in hospital room?: Total Help needed  climbing 3-5 steps with a railing? : Total 6 Click Score: 10    End of Session Equipment Utilized During Treatment: Oxygen Activity Tolerance: Patient limited by pain Patient left: in bed;with call bell/phone within reach;with bed alarm set;with family/visitor present Nurse Communication: Mobility status PT Visit Diagnosis: Other abnormalities of gait and mobility (R26.89);Pain Pain - part of body: (abdomen)     Time: 1001-1020 PT Time Calculation (min) (ACUTE ONLY): 19 min  Charges:  $Therapeutic Activity: 8-22 mins                     Sherie Don, PT, DPT  Acute Rehabilitation Services Pager [phone] Office [place] 09/07/2018, 1:55 PM

## 2018-09-07 NOTE — Progress Notes (Addendum)
[facility]/Trauma Progress Note  2 Days Post-Op   Assessment/Plan HTN PMH EtoH abuse  PMH prostate CA PMH mouth CA PMH CVA with left sided weakness ARF -improved,Creatininenormalized Likely aspirationPNA- finished course of ABX  pSBO - CT 11/26 w/ transition zone in lower midline - NG tube placed in fluorox3, NGT pulled out again on 11/30, and on 12/03 - SB protocol,persistently dilated loops of small bowelseen on AXR's but today improved - S/P ex lap with LOA, BT, 12/04 - ileus suspected as pt has not had flatus, can clamp NGT intermittently for oral pain meds but not for longer than 1 hour at a time  FEN - NPO, TNA11/30>>prealbumin 11 ID - no abx currenlty; afebrile, WBC20 12/05, but suspect this is from surgery, am cbc VTE - SCD's, Lovenox  Plan: NGT, await return of bowel function, IS, monitor WBC, IV tylenol to help with pain control. Allow clamping for 1-2 hours for oxy. Discussed with nurse that NGT was on continuous suction and that it needs to be on intermittent.    LOS: 10 days    Subjective: CC: abdominal pain  Pt denies flatus or BM. No known issues overnight. NGT in place. No family at bedside.   Objective: Vital signs in last 24 hours: Temp:  [99.2 F (37.3 C)-100.2 F (37.9 C)] 99.2 F (37.3 C) (12/06 0640) Pulse Rate:  [84-98] 98 (12/06 0640) Resp:  [16-20] 18 (12/06 0640) BP: (161-169)/(60-77) 169/76 (12/06 0640) SpO2:  [93 %-100 %] 100 % (12/06 0640) FiO2 (%):  [32 %] 32 % (12/05 1112) Last BM Date: 09/04/18  Intake/Output from previous day: No intake/output data recorded. Intake/Output this shift: No intake/output data recorded.  PE: Gen: Alert, NAD, pleasant, cooperative HEENT:mandibular deformity from previous surgery, NGT in place and output is not bilious Lungs: rate and effort normal Abd: Soft,mild distention, no BS appreciated,midline incision C/D/I, appropriately tender Skin: no rashes noted, warm  and dry  Anti-infectives: Anti-infectives (From admission, onward)   Start     Dose/Rate Route Frequency Ordered Stop   09/05/18 1100  ceFAZolin (ANCEF) IVPB 2g/100 mL premix     2 g 200 mL/hr over 30 Minutes Intravenous  Once 09/05/18 1054 09/05/18 1111   09/05/18 1054  ceFAZolin (ANCEF) 2-4 GM/100ML-% IVPB    Note to Pharmacy:  [name]   : cabinet override      09/05/18 1054 09/05/18 1111   08/28/18 2230  cefTRIAXone (ROCEPHIN) 2 g in sodium chloride 0.9 % 100 mL IVPB     2 g 200 mL/hr over 30 Minutes Intravenous Every 24 hours 08/28/18 2215 09/03/18 2232   08/28/18 2230  azithromycin (ZITHROMAX) 500 mg in sodium chloride 0.9 % 250 mL IVPB     500 mg 250 mL/hr over 60 Minutes Intravenous Every 24 hours 08/28/18 2215 09/01/18 2229      Lab Results:  Recent Labs    09/06/18 0414  WBC 20.0*  HGB 11.5*  HCT 37.6*  PLT 314   BMET Recent Labs    09/06/18 0414 09/07/18 0336  NA 135 136  K 4.8 4.4  CL 106 106  CO2 21* 22  GLUCOSE 134* 128*  BUN 28* 25*  CREATININE 0.96 1.06  CALCIUM 8.9 8.7*   PT/INR No results for input(s): LABPROT, INR in the last 72 hours. CMP     Component Value Date/Time   NA 136 09/07/2018 0336   K 4.4 09/07/2018 0336   CL 106 09/07/2018 0336   CO2 22 09/07/2018  0336   GLUCOSE 128 (H) 09/07/2018 0336   BUN 25 (H) 09/07/2018 0336   CREATININE 1.06 09/07/2018 0336   CALCIUM 8.7 (L) 09/07/2018 0336   PROT 6.8 09/06/2018 0414   ALBUMIN 2.5 (L) 09/06/2018 0414   AST 39 09/06/2018 0414   ALT 32 09/06/2018 0414   ALKPHOS 54 09/06/2018 0414   BILITOT 0.4 09/06/2018 0414   GFRNONAA >60 09/07/2018 0336   GFRAA >60 09/07/2018 0336   Lipase     Component Value Date/Time   LIPASE 25 08/28/2018 2142    Studies/Results: Dg Abd 1 View  Result Date: 09/06/2018 CLINICAL DATA:  Nasogastric tube placement. EXAM: ABDOMEN - 1 VIEW COMPARISON:  09/05/2018. FINDINGS: NG tube noted with tip stomach. No bowel distention noted. Degenerative change  thoracolumbar spine IMPRESSION: NG tube noted with tip in stomach. Electronically Signed   By: Marcello Moores  Register   On: 09/06/2018 12:40   X-ray Abdomen Ap  Result Date: 09/05/2018 CLINICAL DATA:  Encounter for NG tube placement. EXAM: ABDOMEN - 1 VIEW COMPARISON:  None. FINDINGS: The radiograph is suboptimal, underpenetrated and includes overlapping shadows of the patient's RIGHT hand. A tube projects over the lower thoracic region, with its tip likely in the distal esophagus, but not in the stomach. Nonobstructive gas pattern. Surgical clips overlie the RIGHT abdomen. IMPRESSION: Suboptimal radiograph. Nasogastric tube is not in the stomach, with tip appearing to project over distal esophagus. Recommend repositioning the tube, with repeat imaging. Electronically Signed   By: Staci Righter M.D.   On: 09/05/2018 15:12      Kalman Drape , [facility] Surgery 09/07/2018, 7:58 AM  Pager: [phone] Mon-Wed, Friday 7:00am-4:30pm Thurs 7am-11:30am  Consults: [phone]

## 2018-09-07 NOTE — Progress Notes (Addendum)
PHARMACY - ADULT TOTAL PARENTERAL NUTRITION CONSULT NOTE   Pharmacy Consult:  TPN Indication:  SBO  Patient Measurements: Height: 6' (182.9 cm) Weight: 211 lb 10.3 oz (96 kg) IBW/kg (Calculated) : 77.6 TPN AdjBW (KG): 96 Body mass index is 28.7 kg/m.  Usual body weight = 91 kg  Assessment:  77 YOM presented on 08/28/18 with abdominal pain and SOB.  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  Also with h/o ruptured appendicitis requiring ex-lap and his hernia is enlarging with chronic cough. CT showed SBO with gallbladder stones.  Patient has been NPO for 5 days with limited PO intake since 08/24/18 per documentation.  MD concerned patient will not improve without surgical intervention; therefore, Pharmacy consulted to initiate TPN.  Patient is at risk for refeeding.  PMH: HTN, ETOH, prostate cancer, mouth cancer, CVA  GI: Enlarging hernia, SBO. Baseline prealbumin low at 11.4, LBM 12/3, +flatus.  Prealbumin lower today at 9.6. Albumin low at 2.5. S/p OR for ex lap and lysis of adhesions on 12/4. NG output minimal yesterday. Patient now with nausea this am and feels like vomiting. Abdomen distended and taut. Endo: no hx DM: CBGs < 150 Insulin requirements in the past 24 hours: 1 unit Lytes: wnl today. K 4.8>4.4 Renal: SCr mostly stable. No UOP charted.  Pulm:  Duonebs Cards: HTN  Hepatobil: LFTs / tbili / TG WNL 133. Neuro: h/o CVA ID: r/o aspiration, on CTX (1126 >> 12/2), off azith for PNA - afebrile, WBC 20, BCx negat  TPN Access: PICC placed 09/01/18  TPN start date: 09/02/18 Nutritional Goals (RD rec on 12/3): 2100-2300 kCal  110-125gm protein per day 2.1-2.3 L per day  Current Nutrition:  NPO TPN  Plan:  Continue TPN at 19ml/hr This TPN provides 120 g of protein, 306 g of dextrose, and 65 g of lipids for a total of 2,176 kcals meeting 100% of patient needs Electrolytes in TPN: standard concentration exc decrease K. Cl:Ac 1:1 Add  MVI, trace elements to TPN Continue sensitive SSI and adjust as needed DC NS Monitor TPN labs - bmet tomorrow to ensure k doesn't continue to drop  Levester Fresh, PharmD, BCPS, BCCCP Clinical Pharmacist [phone]  Please check AMION for all [place] numbers  09/07/2018 7:41 AM

## 2018-09-08 LAB — GLUCOSE, CAPILLARY
Glucose-Capillary: 108 mg/dL — ABNORMAL HIGH (ref 70–99)
Glucose-Capillary: 121 mg/dL — ABNORMAL HIGH (ref 70–99)
Glucose-Capillary: 131 mg/dL — ABNORMAL HIGH (ref 70–99)
Glucose-Capillary: 132 mg/dL — ABNORMAL HIGH (ref 70–99)

## 2018-09-08 LAB — BASIC METABOLIC PANEL
Anion gap: 7 (ref 5–15)
BUN: 31 mg/dL — ABNORMAL HIGH (ref 8–23)
CO2: 22 mmol/L (ref 22–32)
Calcium: 8.9 mg/dL (ref 8.9–10.3)
Chloride: 106 mmol/L (ref 98–111)
Creatinine, Ser: 1.05 mg/dL (ref 0.61–1.24)
GFR calc Af Amer: >60 mL/min (ref >60)
Glucose, Bld: 137 mg/dL — ABNORMAL HIGH (ref 70–99)
Potassium: 3.7 mmol/L (ref 3.5–5.1)
SODIUM: 135 mmol/L (ref 135–145)

## 2018-09-08 LAB — CBC
HCT: 34.4 % — ABNORMAL LOW (ref 39.0–52.0)
Hemoglobin: 10.8 g/dL — ABNORMAL LOW (ref 13.0–17.0)
MCH: 28 pg (ref 26.0–34.0)
MCHC: 31.4 g/dL (ref 30.0–36.0)
MCV: 89.1 fL (ref 80.0–100.0)
Platelets: 261 10*3/uL (ref 150–400)
RBC: 3.86 MIL/uL — ABNORMAL LOW (ref 4.22–5.81)
RDW: 13.6 % (ref 11.5–15.5)
WBC: 11.4 10*3/uL — AB (ref 4.0–10.5)
nRBC: 0 % (ref 0.0–0.2)

## 2018-09-08 MED ORDER — POTASSIUM CHLORIDE 10 MEQ/50ML IV SOLN
10.0000 meq | INTRAVENOUS | Status: AC
  Administered 2018-09-08 (×3): 10 meq via INTRAVENOUS
  Filled 2018-09-08 (×3): qty 50

## 2018-09-08 MED ORDER — ARFORMOTEROL TARTRATE 15 MCG/2ML IN NEBU
15.0000 ug | INHALATION_SOLUTION | Freq: Two times a day (BID) | RESPIRATORY_TRACT | Status: DC
  Administered 2018-09-08 – 2018-09-13 (×8): 15 ug via RESPIRATORY_TRACT
  Filled 2018-09-08 (×10): qty 2

## 2018-09-08 MED ORDER — BUDESONIDE 0.25 MG/2ML IN SUSP
0.2500 mg | Freq: Two times a day (BID) | RESPIRATORY_TRACT | Status: DC
  Administered 2018-09-08 – 2018-09-13 (×8): 0.25 mg via RESPIRATORY_TRACT
  Filled 2018-09-08 (×10): qty 2

## 2018-09-08 MED ORDER — TRAVASOL 10 % IV SOLN
INTRAVENOUS | Status: AC
  Administered 2018-09-08: 18:00:00 via INTRAVENOUS
  Filled 2018-09-08: qty 1203.6

## 2018-09-08 MED ORDER — FUROSEMIDE 10 MG/ML IJ SOLN
40.0000 mg | Freq: Once | INTRAMUSCULAR | Status: AC
  Administered 2018-09-08: 40 mg via INTRAVENOUS
  Filled 2018-09-08: qty 4

## 2018-09-08 NOTE — Progress Notes (Signed)
PHARMACY - ADULT TOTAL PARENTERAL NUTRITION CONSULT NOTE   Pharmacy Consult:  TPN Indication:  SBO  Patient Measurements: Height: 6' (182.9 cm) Weight: 211 lb 10.3 oz (96 kg) IBW/kg (Calculated) : 77.6 TPN AdjBW (KG): 96 Body mass index is 28.7 kg/m.  Usual body weight = 91 kg  Assessment:  77 YOM presented on 08/28/18 with abdominal pain and SOB.  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  Also with h/o ruptured appendicitis requiring ex-lap and his hernia is enlarging with chronic cough. CT showed SBO with gallbladder stones.  Patient has been NPO for 5 days with limited PO intake since 08/24/18 per documentation.  MD concerned patient will not improve without surgical intervention; therefore, Pharmacy consulted to initiate TPN.  Patient is at risk for refeeding.  PMH: HTN, ETOH, prostate cancer, mouth cancer, CVA  GI: Enlarging hernia, SBO. Baseline prealbumin low at 11.4, LBM 12/3, +flatus.  Prealbumin lower today at 9.6. Albumin low at 2.5. S/p OR for ex lap and lysis of adhesions on 12/4. NG output 268mL yesterday. Patient with nausea. No BM or flatus. Endo: no hx DM: CBGs < 150 Insulin requirements in the past 24 hours: 4 units Lytes: wnl today. K trending down to 3.7. Renal: SCr mostly stable. No UOP charted.  Pulm:  Duonebs Cards: HTN  Hepatobil: LFTs / tbili / TG WNL 133. Neuro: h/o CVA ID: r/o aspiration, on CTX (1126 >> 12/2), off azith for PNA - afebrile, WBC 20, BCx negat  TPN Access: PICC placed 09/01/18  TPN start date: 09/02/18 Nutritional Goals (RD rec on 12/3): 2100-2300 kCal  110-125gm protein per day 2.1-2.3 L per day  Current Nutrition:  NPO TPN  Plan:  Continue TPN at 48ml/hr This TPN provides 120 g of protein, 306 g of dextrose, and 65 g of lipids for a total of 2,176 kcals meeting 100% of patient needs Electrolytes in TPN: standard concentration exc K at 16mEq/L. Cl:Ac 1:1 Add MVI, trace elements to TPN   Continue sensitive SSI and adjust as needed DC NS Monitor TPN labs, Bmet tomorrow  Give KCl 26mEq IV x 3 runs today    Elenor Quinones, PharmD, BCPS, [facility] Clinical Pharmacist Phone number [phone] 09/08/2018 8:16 AM

## 2018-09-08 NOTE — Progress Notes (Signed)
3 Days Post-Op   Subjective/Chief Complaint: No flatus or BM NG still in place - 250 ml output Pain seems to be controlled   Objective: Vital signs in last 24 hours: Temp:  [97.6 F (36.4 C)-99.1 F (37.3 C)] 97.6 F (36.4 C) (12/07 0535) Pulse Rate:  [80-87] 80 (12/07 0535) Resp:  [18-20] 18 (12/07 0535) BP: (143-154)/(67-86) 154/86 (12/07 0535) SpO2:  [93 %-96 %] 95 % (12/07 0535) Last BM Date: 09/04/18  Intake/Output from previous day: 12/06 0701 - 12/07 0700 In: 1181.6 [I.V.:1181.6] Out: 3050 [Urine:2800; Emesis/NG output:250] Intake/Output this shift: No intake/output data recorded.  Elderly male - NAD HEENT - mandibulectomy Lungs - CTA B CV - RRR Abd - soft, mildly distended; hypoactive bowel sounds Incision c/d/i  Lab Results:  Recent Labs    09/07/18 0915 09/08/18 0340  WBC 14.7* 11.4*  HGB 11.1* 10.8*  HCT 35.5* 34.4*  PLT 260 261   BMET Recent Labs    09/07/18 0336 09/08/18 0340  NA 136 135  K 4.4 3.7  CL 106 106  CO2 22 22  GLUCOSE 128* 137*  BUN 25* 31*  CREATININE 1.06 1.05  CALCIUM 8.7* 8.9   PT/INR No results for input(s): LABPROT, INR in the last 72 hours. ABG No results for input(s): PHART, HCO3 in the last 72 hours.  Invalid input(s): PCO2, PO2  Studies/Results: Dg Abd 1 View  Result Date: 09/06/2018 CLINICAL DATA:  Nasogastric tube placement. EXAM: ABDOMEN - 1 VIEW COMPARISON:  09/05/2018. FINDINGS: NG tube noted with tip stomach. No bowel distention noted. Degenerative change thoracolumbar spine IMPRESSION: NG tube noted with tip in stomach. Electronically Signed   By: Marcello Moores  Register   On: 09/06/2018 12:40    Anti-infectives: Anti-infectives (From admission, onward)   Start     Dose/Rate Route Frequency Ordered Stop   09/05/18 1100  ceFAZolin (ANCEF) IVPB 2g/100 mL premix     2 g 200 mL/hr over 30 Minutes Intravenous  Once 09/05/18 1054 09/05/18 1111   09/05/18 1054  ceFAZolin (ANCEF) 2-4 GM/100ML-% IVPB    Note to  Pharmacy:  [name]   : cabinet override      09/05/18 1054 09/05/18 1111   08/28/18 2230  cefTRIAXone (ROCEPHIN) 2 g in sodium chloride 0.9 % 100 mL IVPB     2 g 200 mL/hr over 30 Minutes Intravenous Every 24 hours 08/28/18 2215 09/03/18 2232   08/28/18 2230  azithromycin (ZITHROMAX) 500 mg in sodium chloride 0.9 % 250 mL IVPB     500 mg 250 mL/hr over 60 Minutes Intravenous Every 24 hours 08/28/18 2215 09/01/18 2229      Assessment/Plan: HTN PMH EtoH abuse  PMH prostate CA PMH mouth CA PMH CVA with left sided weakness ARF -improved,Creatininenormalized Likely aspirationPNA- finished course of ABX  pSBO - CT 11/26 w/ transition zone in lower midline - NG tube placed in fluorox3, NGT pulled out again on 11/30, and on 12/03 - SB protocol,persistently dilated loops of small bowelseen on AXR's but today improved - S/P ex lap with LOA, BT, 12/04 - ileus suspected as pt has not had flatus and is nauseated, he is a high risk for aspiration so will continue NGT  FEN - NPO, TNA11/30>>prealbumin 11 ID - no abx currenlty; afebrile, WBCdown to 11.4 VTE - SCD's, Lovenox  LOS: 11 days    Maia Petties 09/08/2018

## 2018-09-08 NOTE — Progress Notes (Signed)
PROGRESS NOTE        PATIENT DETAILS Name: Luis Lyons Age: 77 y.o. Sex: male Date of Birth: 1941/07/01 Admit Date: 08/28/2018 Admitting Physician [name], MD [id number], [name], MD  Brief Narrative: 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 Upon further evaluation he was found to have pneumonia and a small bowel obstruction.  Unfortunately patient's obstruction was persistent even with conservative management and underwent laparotomy with lysis of adhesions on 12/4.   Subjective: No BM-or flatus.  Some intermittent wheezing continues.  Assessment/Plan: SBO: Underwent exploratory laparotomy and lysis of adhesions on 12/4.  Appears to have developed a mild ileus postoperatively-no BM/flatus yet.  Continue n.p.o. status and NG tube decompression.  On TNA via PICC line.  Have asked nursing staff to mobilize-out of bed to chair.  General surgery following and directing care.    Leukocytosis: Likely reactive from recent surgery and ongoing ileus-has almost resolved.  Recent chest x-ray on 12/21 showed no pneumonia.  Remains afebrile.    Pneumonia: Has completed a course of antimicrobial therapy-likely aspiration pneumonia in the setting of vomiting and chronic dysphagia.  Repeat chest x-ray on 12/1 showed resolution of pneumonia.  Encourage mobilization with patient/out of bed to chair daily-continue incentive spirometry/flutter valve.  AKI: Has resolved-probably was hemodynamically mediated.  Continue to follow electrolytes periodically   COPD: Has significant upper airway sounds are transmitted-wheezing occasionally-continue bronchodilators, Lasix x1 today.  Continue flutter valve/spirometry/mobilization.  History of CVA with chronic left-sided  weakness: Remains at baseline-plans are to resume aspirin once oral intake has been resumed.  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  Antimicrobial agents: Anti-infectives (From admission, onward)   Start     Dose/Rate Route Frequency Ordered Stop   09/05/18 1100  ceFAZolin (ANCEF) IVPB 2g/100 mL premix     2 g 200 mL/hr over 30 Minutes Intravenous  Once 09/05/18 1054 09/05/18 1111   09/05/18 1054  ceFAZolin (ANCEF) 2-4 GM/100ML-% IVPB    Note to Pharmacy:  [name]   : cabinet override      09/05/18 1054 09/05/18 1111   08/28/18 2230  cefTRIAXone (ROCEPHIN) 2 g in sodium chloride 0.9 % 100 mL IVPB     2 g 200 mL/hr over 30 Minutes Intravenous Every 24 hours 08/28/18 2215 09/03/18 2232   08/28/18 2230  azithromycin (ZITHROMAX) 500 mg in sodium chloride 0.9 % 250 mL IVPB     500 mg 250 mL/hr over 60 Minutes Intravenous Every 24 hours 08/28/18 2215 09/01/18 2229      Procedures: 12/4>> laparotomy with lysis of adhesions 11/30>>PICC  CONSULTS:  general surgery  Time spent: 25- minutes-Greater than 50% of this time was spent in counseling, explanation of diagnosis, planning of further management, and coordination of care.  MEDICATIONS: Scheduled Meds: . enoxaparin (LOVENOX) injection  40 mg Subcutaneous Q24H  . insulin aspart  0-9 Units Subcutaneous Q6H  . ipratropium-albuterol  3 mL Nebulization BID  . mouth rinse  15 mL Mouth Rinse BID  . pantoprazole (PROTONIX) IV  40 mg Intravenous Q24H  . polyvinyl alcohol  1 drop Both Eyes BID   Continuous Infusions: . potassium chloride 10 mEq (09/08/18  0931)  . TPN ADULT (ION) 85 mL/hr at 09/07/18 1740  . TPN ADULT (ION)     PRN Meds:.albuterol, hydrALAZINE, HYDROmorphone (DILAUDID) injection, menthol-cetylpyridinium, ondansetron **OR** ondansetron (ZOFRAN) IV, oxyCODONE, sodium chloride flush   PHYSICAL EXAM: Vital signs: Vitals:   09/07/18 1411 09/07/18 2043 09/08/18 0535 09/08/18 0818  BP: (!) 143/67  (!) 154/86   Pulse: 87  80   Resp: 20  18   Temp: 99.1 F (37.3 C)  97.6 F (36.4 C)   TempSrc: Oral  Oral   SpO2: 96% 96% 95% 95%  Weight:      Height:       Filed Weights   09/05/18 0500 09/05/18 1041 09/06/18 0500  Weight: 93.2 kg 93.2 kg 96 kg   Body mass index is 28.7 kg/m.   General appearance:Awake, alert, not in any distress.  Eyes:no scleral icterus. HEENT: Atraumatic and Normocephalic Neck: supple, no JVD. Resp:Good air entry bilaterally, transmitted upper airway sounds-some wheezing CVS: S1 S2 regular, no murmurs.  GI: Bowel sounds sluggish-dressing in place-not soaked.  Soft abdomen and appropriately tender. Extremities: B/L Lower Ext shows no edema, both legs are warm to touch Neurology:  Non focal but with significant generalized weakness Psychiatric: Normal judgment and insight. Normal mood. Musculoskeletal:No digital cyanosis Skin:No Rash, warm and dry Wounds:N/A  I have personally reviewed following labs and imaging studies  LABORATORY DATA: CBC: Recent Labs  Lab 09/02/18 0435 09/03/18 0356 09/06/18 0414 09/07/18 0915 09/08/18 0340  WBC 11.5* 10.5 20.0* 14.7* 11.4*  NEUTROABS 8.7* 8.0*  --   --   --   HGB 11.7* 10.5* 11.5* 11.1* 10.8*  HCT 39.0 33.7* 37.6* 35.5* 34.4*  MCV 91.1 90.6 90.6 89.9 89.1  PLT 338 274 314 260 867    Basic Metabolic Panel: Recent Labs  Lab 09/02/18 0435 09/03/18 0356 09/04/18 0404 09/05/18 0405 09/06/18 0414 09/07/18 0336 09/08/18 0340  NA 143 140 138 137 135 136 135  K 3.6 3.8 4.0 4.6 4.8 4.4 3.7  CL 108 110 108 106 106 106 106  CO2 23 28 23 23  21*  22 22  GLUCOSE 92 114* 127* 119* 134* 128* 137*  BUN 25* 21 15 19  28* 25* 31*  CREATININE 1.21 1.11 0.91 1.10 0.96 1.06 1.05  CALCIUM 8.6* 8.2* 8.5* 9.0 8.9 8.7* 8.9  MG 2.3 2.1 2.2  --  2.3  --   --   PHOS 2.8 2.8 2.9  --  3.7  --   --     GFR: Estimated Creatinine Clearance: 72 mL/min (by C-G formula based on SCr of 1.05 mg/dL).  Liver Function Tests: Recent Labs  Lab 09/02/18 0435 09/03/18 0356 09/06/18 0414  AST 15 15 39  ALT 12 14 32  ALKPHOS 49 45 54  BILITOT 0.7 0.5 0.4  PROT 7.1 6.1* 6.8  ALBUMIN 2.7* 2.4* 2.5*   No results for input(s): LIPASE, AMYLASE in the last 168 hours. No results for input(s): AMMONIA in the last 168 hours.  Coagulation Profile: No results for input(s): INR, PROTIME in the last 168 hours.  Cardiac Enzymes: No results for input(s): CKTOTAL, CKMB, CKMBINDEX, TROPONINI in the last 168 hours.  BNP (last 3 results) No results for input(s): PROBNP in the  last 8760 hours.  HbA1C: No results for input(s): HGBA1C in the last 72 hours.  CBG: Recent Labs  Lab 09/07/18 0030 09/07/18 1149 09/07/18 1816 09/08/18 0024 09/08/18 0611  GLUCAP 116* 121* 135* 132* 121*    Lipid Profile: No results for input(s): CHOL, HDL, LDLCALC, TRIG, CHOLHDL, LDLDIRECT in the last 72 hours.  Thyroid Function Tests: No results for input(s): TSH, T4TOTAL, FREET4, T3FREE, THYROIDAB in the last 72 hours.  Anemia Panel: No results for input(s): VITAMINB12, FOLATE, FERRITIN, TIBC, IRON, RETICCTPCT in the last 72 hours.  Urine analysis:    Component Value Date/Time   COLORURINE YELLOW 08/30/2018 1245   APPEARANCEUR HAZY (A) 08/30/2018 1245   LABSPEC 1.027 08/30/2018 1245   PHURINE 5.0 08/30/2018 1245   GLUCOSEU NEGATIVE 08/30/2018 1245   HGBUR SMALL (A) 08/30/2018 1245   BILIRUBINUR NEGATIVE 08/30/2018 1245   KETONESUR 5 (A) 08/30/2018 1245   PROTEINUR 30 (A) 08/30/2018 1245   NITRITE POSITIVE (A) 08/30/2018 1245   LEUKOCYTESUR MODERATE (A) 08/30/2018  1245    Sepsis Labs: Lactic Acid, Venous    Component Value Date/Time   LATICACIDVEN 1.86 08/28/2018 2153    MICROBIOLOGY: Recent Results (from the past 240 hour(s))  Surgical pcr screen     Status: None   Collection Time: 09/05/18  9:54 AM  Result Value Ref Range Status   MRSA, PCR NEGATIVE NEGATIVE Final   Staphylococcus aureus NEGATIVE NEGATIVE Final    Comment: (NOTE) The Xpert SA Assay (FDA approved for NASAL specimens in patients 60 years of age and older), is one component of a comprehensive surveillance program. It is not intended to diagnose infection nor to guide or monitor treatment. Performed at [facility], [place] [address] [place], [place] [address]     RADIOLOGY STUDIES/RESULTS: Dg Chest 1 View  Result Date: 09/02/2018 CLINICAL DATA:  Wheezing. EXAM: CHEST  1 VIEW COMPARISON:  09/02/2018 FINDINGS: Right arm PICC line tip is in the cavoatrial junction. There is a feeding tube in place. Heart size normal. No pleural effusion or edema. No airspace opacities identified. IMPRESSION: No acute cardiopulmonary abnormalities. Electronically Signed   By: Kerby Moors M.D.   On: 09/02/2018 15:46   Dg Abd 1 View  Result Date: 09/06/2018 CLINICAL DATA:  Nasogastric tube placement. EXAM: ABDOMEN - 1 VIEW COMPARISON:  09/05/2018. FINDINGS: NG tube noted with tip stomach. No bowel distention noted. Degenerative change thoracolumbar spine IMPRESSION: NG tube noted with tip in stomach. Electronically Signed   By: Marcello Moores  Register   On: 09/06/2018 12:40   X-ray Abdomen Ap  Result Date: 09/05/2018 CLINICAL DATA:  Encounter for NG tube placement. EXAM: ABDOMEN - 1 VIEW COMPARISON:  None. FINDINGS: The radiograph is suboptimal, underpenetrated and includes overlapping shadows of the patient's RIGHT hand. A tube projects over the lower thoracic region, with its tip likely in the distal esophagus, but not in the stomach. Nonobstructive gas pattern. Surgical clips overlie the  RIGHT abdomen. IMPRESSION: Suboptimal radiograph. Nasogastric tube is not in the stomach, with tip appearing to project over distal esophagus. Recommend repositioning the tube, with repeat imaging. Electronically Signed   By: Staci Righter M.D.   On: 09/05/2018 15:12   Dg Abd 1 View  Result Date: 09/03/2018 CLINICAL DATA:  Small-bowel obstruction.  NG tube. EXAM: ABDOMEN - 1 VIEW COMPARISON:  Abdominal radiographic series-09/02/2018; 09/01/2018; 08/29/2018; CT abdomen pelvis-11/26/fluoroscopic guided nasogastric tube placement-09/02/2018 FINDINGS: There is been apparent exchange of existing weighted tip feeding tube with new enteric tube tip  overlying the expected location of the descending portion of the duodenum. Re demonstrated moderate gaseous distension of several rather patulous loops of small bowel, unchanged to slightly improved compared to the 09/02/2018 examination with index loop of small bowel in the right mid/lower abdomen measuring 5.6 cm in diameter. Nondiagnostic evaluation for pneumoperitoneum secondary to supine positioning exclusion of the lower thorax. Stigmata of DISH throughout the thoracic spine. Post ORIF of the left femur and femoral neck. IMPRESSION: 1. No change to slight improvement in findings again concerning for small bowel obstruction. 2. Apparent change of enteric tube with new enteric tube tip terminating over the expected location of the descending portion duodenum. Electronically Signed   By: Sandi Mariscal M.D.   On: 09/03/2018 07:46   Dg Abd 1 View  Result Date: 09/01/2018 CLINICAL DATA:  SBO, upright image requested by MD. EXAM: ABDOMEN - 1 VIEW COMPARISON:  08/30/2018 FINDINGS: Nasogastric tube has been advanced into the proximal duodenum. The stomach appears decompressed. There are multiple gas distended small bowel loops identified in the upper abdomen. The lower abdomen is excluded. Visualized portions of colon are nondilated. There is no convincing free air.  Visualized lung bases unremarkable. IMPRESSION: 1. Nasogastric tube advancement into the proximal duodenum, with persistent small bowel distention. Electronically Signed   By: Lucrezia Europe M.D.   On: 09/01/2018 07:57   Dg Abd 1 View  Result Date: 08/30/2018 CLINICAL DATA:  Small bowel obstruction EXAM: ABDOMEN - 1 VIEW COMPARISON:  August 29, 2018 FINDINGS: The NG tube terminates in the proximal stomach. Small bowel dilatation is less prominent on this study but remains. No other acute abnormalities. IMPRESSION: The small bowel dilatation is less prominent but remains consistent with persistent small-bowel obstruction. The enteric tube appears to terminate in the proximal stomach. Electronically Signed   By: Dorise Bullion III M.D   On: 08/30/2018 13:22   Dg Abd 1 View  Result Date: 08/29/2018 CLINICAL DATA:  Replacement of NG tube. EXAM: ABDOMEN - 1 VIEW COMPARISON:  One-view abdomen 08/29/2018 at 8:46 a.m. FINDINGS: Side port of the NG tube is beyond the GE junction. Mildly dilated loops of small bowel are similar the prior exam. No free air is present. IMPRESSION: 1. Side port of the NG tube is beyond the GE junction. 2. Similar appearance of multiple dilated loops of small bowel without evidence of free air. Electronically Signed   By: San Morelle M.D.   On: 08/29/2018 13:08   Dg Abd 1 View  Result Date: 08/29/2018 CLINICAL DATA:  Check nasogastric catheter placement EXAM: ABDOMEN - 1 VIEW COMPARISON:  None. FINDINGS: 30 seconds of fluoroscopy was utilized for placement. The nasogastric catheter is shown coiled within the gastric lumen. IMPRESSION: Nasogastric catheter placed in the stomach. Electronically Signed   By: Inez Catalina M.D.   On: 08/29/2018 09:08   Ct Abdomen Pelvis W Contrast  Result Date: 08/28/2018 CLINICAL DATA:  Abdominal pain with shortness of breath EXAM: CT ABDOMEN AND PELVIS WITH CONTRAST TECHNIQUE: Multidetector CT imaging of the abdomen and pelvis was  performed using the standard protocol following bolus administration of intravenous contrast. CONTRAST:  125mL OMNIPAQUE IOHEXOL 300 MG/ML  SOLN COMPARISON:  None. FINDINGS: LOWER CHEST: There is no basilar pleural or apical pericardial effusion. HEPATOBILIARY: The hepatic contours and density are normal. There is no intra- or extrahepatic biliary dilatation. There is cholelithiasis without acute inflammation. PANCREAS: The pancreatic parenchymal contours are normal and there is no ductal dilatation. There is no peripancreatic fluid  collection. SPLEEN: Normal. ADRENALS/URINARY TRACT: --Adrenal glands: Normal. --Right kidney/ureter: Mild atrophy. No hydronephrosis. --Left kidney/ureter: Vascular calcifications and interpolar nonobstructive 3 mm nephrolithiasis. No hydronephrosis. --Urinary bladder: Mild bladder wall thickening, possibly due to underdistention. STOMACH/BOWEL: --Stomach/Duodenum: There is no hiatal hernia or other gastric abnormality. The duodenal course and caliber are normal. --Small bowel: There are multiple loops of dilated small bowel in the central abdomen, predominantly anteriorly. One of these loops is directly beneath the skin surface, extending through a diastatic defect of the anterior abdominal muscles. Suspected transition point is in the anterior low midline abdomen (coronal image 24 and sagittal image 70). --Colon: No focal abnormality. --Appendix: Not visualized. No right lower quadrant inflammation or free fluid. VASCULAR/LYMPHATIC: Atherosclerotic calcification is present within the non-aneurysmal abdominal aorta, without hemodynamically significant stenosis. The portal vein, splenic vein, superior mesenteric vein and IVC are patent. No abdominal or pelvic lymphadenopathy. REPRODUCTIVE: Normal prostate size with symmetric seminal vesicles. MUSCULOSKELETAL. Compression deformity of L2 is probably chronic. There is grade 1 L4-5 anterolisthesis secondary to facet arthrosis. OTHER: None.  IMPRESSION: 1. Small bowel obstruction with suspected transition point in the low anterior midline abdomen. 2. Cholelithiasis without acute inflammation. 3. Nonobstructing left nephrolithiasis. Aortic Atherosclerosis (ICD10-I70.0). Electronically Signed   By: Ulyses Jarred M.D.   On: 08/28/2018 23:21   Dg Chest Port 1 View  Result Date: 08/28/2018 CLINICAL DATA:  77 y/o  M; abdominal pain and shortness of breath. EXAM: PORTABLE CHEST 1 VIEW COMPARISON:  None. FINDINGS: Normal cardiac silhouette. Aortic atherosclerosis with calcification. Perihilar and basilar reticular opacities. No consolidation. No pleural effusion or pneumothorax. No acute osseous abnormality is evident. IMPRESSION: Basilar reticular opacities may represent mild interstitial edema or atypical pneumonia. No consolidation. Electronically Signed   By: Kristine Garbe M.D.   On: 08/28/2018 21:55   Dg Abd Portable 1v  Result Date: 09/05/2018 CLINICAL DATA:  Ongoing abdomen pain and distention. EXAM: PORTABLE ABDOMEN - 1 VIEW COMPARISON:  09/04/2018. FINDINGS: Nasogastric tube has been removed. There has been persistence of and perhaps mild progression of dilated small bowel loops along with inspissated contrast throughout a distended colon. IMPRESSION: Slight progression of dilated small bowel loops along with inspissated contrast throughout a distended colon. Findings remain consistent with a partial small bowel obstruction. Electronically Signed   By: Staci Righter M.D.   On: 09/05/2018 10:43   Dg Abd Portable 1v  Result Date: 09/04/2018 CLINICAL DATA:  Follow up small bowel obstruction EXAM: PORTABLE ABDOMEN - 1 VIEW COMPARISON:  09/03/2018 FINDINGS: Nasogastric catheter is noted in the second portion of the duodenum stable from the prior exam. Scattered large and small bowel gas is noted. There is been a slight decrease in the degree of small bowel dilatation when compared with the previous exam. No free air is noted.  Postsurgical changes in the left hip are again seen. IMPRESSION: Improvement in the degree of small bowel dilatation when compared with the previous day. Continued follow-up is recommended. Electronically Signed   By: Inez Catalina M.D.   On: 09/04/2018 08:46   Dg Abd Portable 1v  Result Date: 08/29/2018 CLINICAL DATA:  Small bowel obstruction, 8 hour delayed film after Gastrografin EXAM: PORTABLE ABDOMEN - 1 VIEW COMPARISON:  Abdominal radiograph from earlier today FINDINGS: Enteric tube terminates in the proximal stomach. Moderately dilated small bowel loops throughout the abdomen are not appreciably changed. Oral contrast transits to the mid small bowel. No definite oral contrast in the colon. Moderate colonic stool. No evidence of pneumatosis or  pneumoperitoneum. Clear lung bases. IMPRESSION: 1. Enteric tube terminates in the stomach. 2. Stable moderately dilated small bowel loops compatible with small-bowel obstruction. Oral contrast transits to the mid small bowel, with no definite oral contrast in the colon. Electronically Signed   By: Ilona Sorrel M.D.   On: 08/29/2018 21:38   Dg Abd Portable 1v-small Bowel Obstruction Protocol-initial, 8 Hr Delay  Result Date: 08/29/2018 CLINICAL DATA:  Follow up small bowel obstruction EXAM: PORTABLE ABDOMEN - 1 VIEW COMPARISON:  08/28/2018 FINDINGS: Nasogastric catheter is now noted within the stomach. Fecal material is noted throughout the colon. Contrast is seen within the bladder from recent CT examination. Multiple dilated small bowel loops are again identified in the mid abdomen stable from the recent exam. No acute bony abnormality is noted. Prior L2 compression deformity is noted. IMPRESSION: Stable small bowel dilatation following nasogastric catheter placement Electronically Signed   By: Inez Catalina M.D.   On: 08/29/2018 09:09   Dg Abd Portable 2v  Result Date: 09/02/2018 CLINICAL DATA:  Initial evaluation for possible foreign body, missing piece of  NG tube. EXAM: PORTABLE ABDOMEN - 2 VIEW COMPARISON:  Prior radiograph from 09/01/2018. FINDINGS: Previously seen enteric tube has been removed. No visible tube fragment or other radiopaque foreign body identified. Persistent small bowel dilatation noted within the upper abdomen, suggestive of ongoing small bowel obstruction. This appears improved from previous. Partially visualized lung bases are grossly clear. IMPRESSION: 1. No visible tube fragment or other radiopaque foreign body identified. 2. Persistent but improved small bowel obstruction. Electronically Signed   By: Jeannine Boga M.D.   On: 09/02/2018 05:52   Dg Addison Bailey G Tube Plc W/fl W/rad  Result Date: 09/03/2018 CLINICAL DATA:  Were acquires nasogastric tube placement. EXAM: NASO G TUBE PLACEMENT WITH FL AND WITH RAD CONTRAST:  Water-soluble contrast FLUOROSCOPY TIME:  Fluoroscopy Time:  1 minutes 12 seconds Radiation Exposure Index (if provided by the fluoroscopic device): Number of Acquired Spot Images: 2 COMPARISON:  Earlier today FINDINGS: Patient's indwelling weighted feeding tube was removed. A nasogastric tube was then placed through the patient's right nares and under direct fluoroscopic guidance was manipulated into the stomach. The tube was in extended into the duodenum. A small volume of water-soluble contrast material was injected into the nasogastric tube in the tip terminates at the distal portion of the descending duodenum. IMPRESSION: Removal of indwelling weighted feeding tube with subsequent fluoroscopic guided placement of nasogastric tube into the proximal duodenum. Contrast injected into the nasogastric tube confirms tip location at the distal portion of the descending duodenum. Electronically Signed   By: Kerby Moors M.D.   On: 09/03/2018 12:51   Dg Addison Bailey G Tube Plc W/fl W/rad  Result Date: 09/02/2018 CLINICAL DATA:  NG tube placement. EXAM: NASO G TUBE PLACEMENT WITH FL AND WITH RAD CONTRAST:  Water-soluble contrast  FLUOROSCOPY TIME:  Fluoroscopy Time:  5 minutes 48 seconds Radiation Exposure Index (if provided by the fluoroscopic device): Number of Acquired Spot Images: 2 COMPARISON:  09/02/2018 FINDINGS: Under direct fluoroscopic guidance a weighted nasogastric tube was placed into the right nares. The tube was then guided into the esophagus, past the GE junction and into the gastric lumen. The tube was then manipulated into the third portion of the duodenum. A small volume of water-soluble contrast material was injected into the tube confirming placement of the tube at the level of the duodenal jejunal junction. IMPRESSION: 1. Successful fluoroscopic guided weighted nasogastric tube placement. The tip is at the  level of the duodenal jejunal junction and is ready for use. Electronically Signed   By: Kerby Moors M.D.   On: 09/02/2018 11:43   [place] Ekg Site Rite  Result Date: 09/01/2018 If Site Rite image not attached, placement could not be confirmed due to current cardiac rhythm.    LOS: 11 days   Oren Binet, MD  [facility]  If 7PM-7AM, please contact night-coverage  Please page via [email or web address] [email or web address]-click on MD name and type text message  09/08/2018, 11:52 AM

## 2018-09-09 ENCOUNTER — Inpatient Hospital Stay (HOSPITAL_COMMUNITY): Payer: Medicare Other

## 2018-09-09 LAB — BASIC METABOLIC PANEL
Anion gap: 10 (ref 5–15)
BUN: 39 mg/dL — ABNORMAL HIGH (ref 8–23)
CO2: 23 mmol/L (ref 22–32)
Calcium: 9.4 mg/dL (ref 8.9–10.3)
Chloride: 104 mmol/L (ref 98–111)
Creatinine, Ser: 1.29 mg/dL — ABNORMAL HIGH (ref 0.61–1.24)
GFR calc Af Amer: >60 mL/min (ref >60)
GFR calc non Af Amer: 53 mL/min — ABNORMAL LOW (ref >60)
GLUCOSE: 141 mg/dL — AB (ref 70–99)
Potassium: 3.9 mmol/L (ref 3.5–5.1)
Sodium: 137 mmol/L (ref 135–145)

## 2018-09-09 LAB — GLUCOSE, CAPILLARY
Glucose-Capillary: 102 mg/dL — ABNORMAL HIGH (ref 70–99)
Glucose-Capillary: 116 mg/dL — ABNORMAL HIGH (ref 70–99)
Glucose-Capillary: 125 mg/dL — ABNORMAL HIGH (ref 70–99)
Glucose-Capillary: 129 mg/dL — ABNORMAL HIGH (ref 70–99)

## 2018-09-09 MED ORDER — POTASSIUM CHLORIDE 10 MEQ/50ML IV SOLN
10.0000 meq | Freq: Once | INTRAVENOUS | Status: AC
  Administered 2018-09-09: 10 meq via INTRAVENOUS
  Filled 2018-09-09: qty 50

## 2018-09-09 MED ORDER — TRAVASOL 10 % IV SOLN
INTRAVENOUS | Status: AC
  Administered 2018-09-09: 18:00:00 via INTRAVENOUS
  Filled 2018-09-09: qty 1203.6

## 2018-09-09 MED ORDER — BISACODYL 10 MG RE SUPP
10.0000 mg | Freq: Once | RECTAL | Status: AC
  Administered 2018-09-09: 10 mg via RECTAL
  Filled 2018-09-09: qty 1

## 2018-09-09 NOTE — Progress Notes (Addendum)
PHARMACY - ADULT TOTAL PARENTERAL NUTRITION CONSULT NOTE   Pharmacy Consult:  TPN Indication:  SBO  Patient Measurements: Height: 6' (182.9 cm) Weight: 203 lb 11.3 oz (92.4 kg) IBW/kg (Calculated) : 77.6 TPN AdjBW (KG): 96 Body mass index is 27.63 kg/m.  Usual body weight = 91 kg  Assessment:  77 YOM presented on 08/28/18 with abdominal pain and SOB.  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  Also with h/o ruptured appendicitis requiring ex-lap and his hernia is enlarging with chronic cough. CT showed SBO with gallbladder stones.  Patient has been NPO for 5 days with limited PO intake since 08/24/18 per documentation.  MD concerned patient will not improve without surgical intervention; therefore, Pharmacy consulted to initiate TPN.  Patient is at risk for refeeding.  PMH: HTN, ETOH, prostate cancer, mouth cancer, CVA  GI: Enlarging hernia, SBO. Baseline prealbumin low at 11.4, LBM 12/3. Prealbumin lower today at 9.6. Albumin low at 2.5. S/p OR for ex lap and lysis of adhesions on 12/4. NG output not charted from yesterday. Patient with nausea. No BM or flatus - MD concerned for ileus. Endo: no hx DM: CBGs < 150 Insulin requirements in the past 24 hours: 3 units Lytes: wnl today. K 3.9 << 3.7 s/p K-runs x 3 and incr of K in TPN bag on 12/7 (goal >/=4 with concern for ileus) Renal: SCr up to 1.29 << 1.05, CrCl~50-55 ml/min. Lasix 40 mg IV x 1 yesterday. Pulm:  Duonebs Cards: HTN  Hepatobil: LFTs / tbili / TG WNL 133 (12/2) Neuro: h/o CVA ID: r/o aspiration, on CTX (1126 >> 12/2), off azith for PNA - Afebrile, WBC 11.4, BCx negat  TPN Access: PICC placed 09/01/18  TPN start date: 09/02/18 Nutritional Goals (RD rec on 12/3): 2100-2300 kCal  110-125gm protein per day 2.1-2.3 L per day  Current Nutrition:  NPO TPN  Plan:  Continue TPN at 56ml/hr This TPN provides 120 g of protein, 306 g of dextrose, and 65 g of lipids for a total of 2,176  kcals meeting 100% of patient needs Electrolytes in TPN: standard concentration exc K at 65mEq/L. Cl:Ac 1:1 Add MVI, trace elements to TPN  Continue sensitive SSI and adjust as needed DC NS Monitor TPN labs  Give KCl 28mEq IV x 1 runs today    Thank you for allowing pharmacy to be a part of this patient's care.  Alycia Rossetti, PharmD, BCPS Clinical Pharmacist Pager: [phone] Clinical phone for 09/09/2018 from 7a-3:30p: [phone] If after 3:30p, please call main pharmacy at: [phone] Please check AMION for all [facility] numbers 09/09/2018 8:13 AM

## 2018-09-09 NOTE — Progress Notes (Signed)
4 Days Post-Op   Subjective/Chief Complaint: Small amount of flatus; no BM Feels distended Plain films - gas and stool in colon; no dilated SB loops NG seems to be pulled back slightly   Objective: Vital signs in last 24 hours: Temp:  [98.9 F (37.2 C)] 98.9 F (37.2 C) (12/07 1435) Pulse Rate:  [96] 96 (12/07 1435) Resp:  [22] 22 (12/07 1435) BP: (160)/(87) 160/87 (12/07 1435) SpO2:  [97 %-99 %] 97 % (12/08 0811) Weight:  [92.4 kg] 92.4 kg (12/07 1655) Last BM Date: 09/04/18  Intake/Output from previous day: 12/07 0701 - 12/08 0700 In: 1087 [I.V.:987; IV Piggyback:100] Out: 350 [Urine:350] Intake/Output this shift: No intake/output data recorded.  WDWN in NAD Lungs - CTA B CV - RRR Abd - soft, distended; + BS Incision c/d/i   Lab Results:  Recent Labs    09/07/18 0915 09/08/18 0340  WBC 14.7* 11.4*  HGB 11.1* 10.8*  HCT 35.5* 34.4*  PLT 260 261   BMET Recent Labs    09/08/18 0340 09/09/18 0331  NA 135 137  K 3.7 3.9  CL 106 104  CO2 22 23  GLUCOSE 137* 141*  BUN 31* 39*  CREATININE 1.05 1.29*  CALCIUM 8.9 9.4   PT/INR No results for input(s): LABPROT, INR in the last 72 hours. ABG No results for input(s): PHART, HCO3 in the last 72 hours.  Invalid input(s): PCO2, PO2  Studies/Results: Dg Abd Portable 1v  Result Date: 09/09/2018 CLINICAL DATA:  Follow-up small bowel obstruction EXAM: PORTABLE ABDOMEN - 1 VIEW COMPARISON:  09/06/2018 FINDINGS: No nasogastric tube identified. Previous midline laparotomy. Moderate amount of gas and stool within the colon. No dilated small bowel loops identified. IMPRESSION: Moderate amount of gas and stool in the colon. Electronically Signed   By: Nelson Chimes M.D.   On: 09/09/2018 08:31    Anti-infectives: Anti-infectives (From admission, onward)   Start     Dose/Rate Route Frequency Ordered Stop   09/05/18 1100  ceFAZolin (ANCEF) IVPB 2g/100 mL premix     2 g 200 mL/hr over 30 Minutes Intravenous  Once  09/05/18 1054 09/05/18 1111   09/05/18 1054  ceFAZolin (ANCEF) 2-4 GM/100ML-% IVPB    Note to Pharmacy:  [name]   : cabinet override      09/05/18 1054 09/05/18 1111   08/28/18 2230  cefTRIAXone (ROCEPHIN) 2 g in sodium chloride 0.9 % 100 mL IVPB     2 g 200 mL/hr over 30 Minutes Intravenous Every 24 hours 08/28/18 2215 09/03/18 2232   08/28/18 2230  azithromycin (ZITHROMAX) 500 mg in sodium chloride 0.9 % 250 mL IVPB     500 mg 250 mL/hr over 60 Minutes Intravenous Every 24 hours 08/28/18 2215 09/01/18 2229      Assessment/Plan: HTN PMH EtoH abuse  PMH prostate CA PMH mouth CA PMH CVA with left sided weakness ARF -improved,Creatininenormalized Likely aspirationPNA-finished course of ABX  pSBO - CT 11/26 w/ transition zone in lower midline - NG tube placed in fluorox3, NGT pulled out again on 11/30, and on 12/03 - SB protocol,persistently dilated loops of small bowelseen on AXR's but today improved -S/P ex lap with LOA, BT, 12/04 - post-op ileus  Dulcolax suppository x 1 today FEN - NPO, TNA11/30>>prealbumin 11 ID -no abx currenlty; afebrile, WBCdown to 11.4 VTE - SCD's, Lovenox    LOS: 12 days    Maia Petties 09/09/2018

## 2018-09-09 NOTE — Progress Notes (Signed)
PROGRESS NOTE        PATIENT DETAILS Name: Luis Lyons Age: 77 y.o. Sex: male Date of Birth: 12/14/1940 Admit Date: 08/28/2018 Admitting Physician [name], MD [id number], [name], MD  Brief Narrative: 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 Upon further evaluation he was found to have pneumonia and a small bowel obstruction.  Unfortunately patient's obstruction was persistent even with conservative management and underwent laparotomy with lysis of adhesions on 12/4.   Subjective: Patient in bed, appears comfortable, denies any headache, no fever, no chest pain or pressure, no shortness of breath , no abdominal pain or nausea, he is passing flatus now. No focal weakness.   Assessment/Plan:  SBO: Underwent exploratory laparotomy and lysis of adhesions on 12/4.  Clear improving, on TNA via right arm PICC line.  On 09/09/2018 he started to pass flatus, unfortunately his NG tube has been pulled out again, x-rays improved, no surgery has challenged him with Dulcolax suppository.  Have put him in the chair will advance activity and monitor bowel activity.  Hopefully we can avoid another NG tube.  Leukocytosis: Likely reactive from recent surgery and ongoing ileus-has almost resolved.  Recent chest x-ray on 12/21 showed no pneumonia.  Remains afebrile.    Pneumonia: Has chronic dysphagia due to oropharyngeal surgery in the past, pneumonia likely was due to aspiration.  Has completed antibiotic treatment.  Continue to monitor clinically.  Flutter valve and I-S for pulmonary toiletry to be continued.  Encouraged to sit up in chair as much as possible.    AKI: Due to dehydration completely resolved.  COPD: Has significant upper airway sounds are  transmitted-wheezing occasionally-continue bronchodilators, Lasix x1 today.  Continue flutter valve/spirometry/mobilization.  History of CVA with chronic left-sided weakness: Remains at baseline-plans are to resume aspirin once oral intake has been resumed.  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  Antimicrobial agents: Anti-infectives (From admission, onward)   Start     Dose/Rate Route Frequency Ordered Stop   09/05/18 1100  ceFAZolin (ANCEF) IVPB 2g/100 mL premix     2 g 200 mL/hr over 30 Minutes Intravenous  Once 09/05/18 1054 09/05/18 1111   09/05/18 1054  ceFAZolin (ANCEF) 2-4 GM/100ML-% IVPB    Note to Pharmacy:  [name]   : cabinet override      09/05/18 1054 09/05/18 1111   08/28/18 2230  cefTRIAXone (ROCEPHIN) 2 g in sodium chloride 0.9 % 100 mL IVPB     2 g 200 mL/hr over 30 Minutes Intravenous Every 24 hours 08/28/18 2215 09/03/18 2232   08/28/18 2230  azithromycin (ZITHROMAX) 500 mg in sodium chloride 0.9 % 250 mL IVPB     500 mg 250 mL/hr over 60 Minutes Intravenous Every 24 hours 08/28/18 2215 09/01/18 2229      Procedures: 12/4>> laparotomy with lysis of adhesions 11/30>>PICC  CONSULTS:  general surgery  Time spent: 25- minutes-Greater  than 50% of this time was spent in counseling, explanation of diagnosis, planning of further management, and coordination of care.  MEDICATIONS: Scheduled Meds: . arformoterol  15 mcg Nebulization BID  . budesonide (PULMICORT) nebulizer solution  0.25 mg Nebulization BID  . enoxaparin (LOVENOX) injection  40 mg Subcutaneous Q24H  . insulin  aspart  0-9 Units Subcutaneous Q6H  . ipratropium-albuterol  3 mL Nebulization BID  . mouth rinse  15 mL Mouth Rinse BID  . pantoprazole (PROTONIX) IV  40 mg Intravenous Q24H  . polyvinyl alcohol  1 drop Both Eyes BID   Continuous Infusions: . TPN ADULT (ION) 85 mL/hr at 09/08/18 1822  . TPN ADULT (ION)     PRN Meds:.albuterol, hydrALAZINE, HYDROmorphone (DILAUDID) injection, menthol-cetylpyridinium, ondansetron **OR** ondansetron (ZOFRAN) IV, oxyCODONE, sodium chloride flush   PHYSICAL EXAM: Vital signs: Vitals:   09/08/18 1435 09/08/18 1655 09/08/18 2138 09/09/18 0811  BP: (!) 160/87     Pulse: 96     Resp: (!) 22     Temp: 98.9 F (37.2 C)     TempSrc:      SpO2: 99%  99% 97%  Weight:  92.4 kg    Height:       Filed Weights   09/05/18 1041 09/06/18 0500 09/08/18 1655  Weight: 93.2 kg 96 kg 92.4 kg   Body mass index is 27.63 kg/m.   Exam  Awake Alert, Oriented X 3, No new F.N deficits, Normal affect [place].AT,PERRAL Supple Neck,No JVD, No cervical lymphadenopathy appriciated.  Symmetrical Chest wall movement, Good air movement bilaterally, CTAB RRR,No Gallops, Rubs or new Murmurs, No Parasternal Heave +ve B.Sounds, Abd Soft, positive bowel sounds, midline laparotomy scar under bandage, NG tube in place, right arm PICC No Cyanosis, Clubbing or edema, No new Rash or bruise   I have personally reviewed following labs and imaging studies  LABORATORY DATA: CBC: Recent Labs  Lab 09/03/18 0356 09/06/18 0414 09/07/18 0915 09/08/18 0340  WBC 10.5 20.0* 14.7* 11.4*  NEUTROABS 8.0*  --   --   --   HGB 10.5* 11.5* 11.1* 10.8*  HCT 33.7* 37.6* 35.5* 34.4*  MCV 90.6 90.6 89.9 89.1  PLT 274 314 260 102    Basic Metabolic Panel: Recent Labs  Lab 09/03/18 0356 09/04/18 0404 09/05/18 0405 09/06/18 0414 09/07/18 0336 09/08/18 0340 09/09/18 0331  NA 140 138 137 135 136 135 137  K 3.8 4.0 4.6 4.8 4.4 3.7 3.9  CL 110 108 106 106 106 106 104  CO2 28 23 23  21* 22 22  23   GLUCOSE 114* 127* 119* 134* 128* 137* 141*  BUN 21 15 19  28* 25* 31* 39*  CREATININE 1.11 0.91 1.10 0.96 1.06 1.05 1.29*  CALCIUM 8.2* 8.5* 9.0 8.9 8.7* 8.9 9.4  MG 2.1 2.2  --  2.3  --   --   --   PHOS 2.8 2.9  --  3.7  --   --   --     GFR: Estimated Creatinine Clearance: 53.5 mL/min (A) (by C-G formula based on SCr of 1.29 mg/dL (H)).  Liver Function Tests: Recent Labs  Lab 09/03/18 0356 09/06/18 0414  AST 15 39  ALT 14 32  ALKPHOS 45 54  BILITOT 0.5 0.4  PROT 6.1* 6.8  ALBUMIN 2.4* 2.5*   No results for input(s): LIPASE, AMYLASE in the last 168 hours. No results for input(s): AMMONIA in the last 168 hours.  Coagulation Profile: No results for input(s): INR, PROTIME in the last 168  hours.  Cardiac Enzymes: No results for input(s): CKTOTAL, CKMB, CKMBINDEX, TROPONINI in the last 168 hours.  BNP (last 3 results) No results for input(s): PROBNP in the last 8760 hours.  HbA1C: No results for input(s): HGBA1C in the last 72 hours.  CBG: Recent Labs  Lab 09/08/18 0611 09/08/18 1203 09/08/18 1904 09/09/18 0011 09/09/18 0658  GLUCAP 121* 108* 131* 129* 102*    Lipid Profile: No results for input(s): CHOL, HDL, LDLCALC, TRIG, CHOLHDL, LDLDIRECT in the last 72 hours.  Thyroid Function Tests: No results for input(s): TSH, T4TOTAL, FREET4, T3FREE, THYROIDAB in the last 72 hours.  Anemia Panel: No results for input(s): VITAMINB12, FOLATE, FERRITIN, TIBC, IRON, RETICCTPCT in the last 72 hours.  Urine analysis:    Component Value Date/Time   COLORURINE YELLOW 08/30/2018 1245   APPEARANCEUR HAZY (A) 08/30/2018 1245   LABSPEC 1.027 08/30/2018 1245   PHURINE 5.0 08/30/2018 1245   GLUCOSEU NEGATIVE 08/30/2018 1245   HGBUR SMALL (A) 08/30/2018 1245   BILIRUBINUR NEGATIVE 08/30/2018 1245   KETONESUR 5 (A) 08/30/2018 1245   PROTEINUR 30 (A) 08/30/2018 1245   NITRITE POSITIVE (A) 08/30/2018 1245   LEUKOCYTESUR MODERATE (A) 08/30/2018 1245    Sepsis  Labs: Lactic Acid, Venous    Component Value Date/Time   LATICACIDVEN 1.86 08/28/2018 2153    MICROBIOLOGY: Recent Results (from the past 240 hour(s))  Surgical pcr screen     Status: None   Collection Time: 09/05/18  9:54 AM  Result Value Ref Range Status   MRSA, PCR NEGATIVE NEGATIVE Final   Staphylococcus aureus NEGATIVE NEGATIVE Final    Comment: (NOTE) The Xpert SA Assay (FDA approved for NASAL specimens in patients 62 years of age and older), is one component of a comprehensive surveillance program. It is not intended to diagnose infection nor to guide or monitor treatment. Performed at [facility], [place] [address], [place], [place] [address]     RADIOLOGY STUDIES/RESULTS: Dg Chest 1 View  Result Date: 09/02/2018 CLINICAL DATA:  Wheezing. EXAM: CHEST  1 VIEW COMPARISON:  09/02/2018 FINDINGS: Right arm PICC line tip is in the cavoatrial junction. There is a feeding tube in place. Heart size normal. No pleural effusion or edema. No airspace opacities identified. IMPRESSION: No acute cardiopulmonary abnormalities. Electronically Signed   By: Kerby Moors M.D.   On: 09/02/2018 15:46   Dg Abd 1 View  Result Date: 09/06/2018 CLINICAL DATA:  Nasogastric tube placement. EXAM: ABDOMEN - 1 VIEW COMPARISON:  09/05/2018. FINDINGS: NG tube noted with tip stomach. No bowel distention noted. Degenerative change thoracolumbar spine IMPRESSION: NG tube noted with tip in stomach. Electronically Signed   By: Marcello Moores  Register   On: 09/06/2018 12:40   X-ray Abdomen Ap  Result Date: 09/05/2018 CLINICAL DATA:  Encounter for NG tube placement. EXAM: ABDOMEN - 1 VIEW COMPARISON:  None. FINDINGS: The radiograph is suboptimal, underpenetrated and includes overlapping shadows of the patient's RIGHT hand. A tube projects over the lower thoracic region, with its tip likely in the distal esophagus, but not in the stomach. Nonobstructive gas pattern. Surgical clips overlie the RIGHT abdomen.  IMPRESSION: Suboptimal radiograph. Nasogastric tube is not in the stomach, with tip appearing to project over distal esophagus. Recommend repositioning the tube, with repeat imaging. Electronically Signed   By: Staci Righter M.D.   On: 09/05/2018 15:12   Dg Abd 1 View  Result Date: 09/03/2018 CLINICAL DATA:  Small-bowel obstruction.  NG tube. EXAM: ABDOMEN - 1 VIEW COMPARISON:  Abdominal radiographic series-09/02/2018; 09/01/2018; 08/29/2018; CT abdomen pelvis-11/26/fluoroscopic guided nasogastric tube placement-09/02/2018 FINDINGS: There is been apparent exchange of existing weighted tip feeding tube with new enteric tube tip overlying the expected location of the descending portion of the duodenum. Re demonstrated moderate gaseous distension of several rather patulous loops of small bowel, unchanged to slightly improved compared to the 09/02/2018 examination with index loop of small bowel in the right mid/lower abdomen measuring 5.6 cm in diameter. Nondiagnostic evaluation for pneumoperitoneum secondary to supine positioning exclusion of the lower thorax. Stigmata of DISH throughout the thoracic spine. Post ORIF of the left femur and femoral neck. IMPRESSION: 1. No change to slight improvement in findings again concerning for small bowel obstruction. 2. Apparent change of enteric tube with new enteric tube tip terminating over the expected location of the descending portion duodenum. Electronically Signed   By: Sandi Mariscal M.D.   On: 09/03/2018 07:46   Dg Abd 1 View  Result Date: 09/01/2018 CLINICAL DATA:  SBO, upright image requested by MD. EXAM: ABDOMEN - 1 VIEW COMPARISON:  08/30/2018 FINDINGS: Nasogastric tube has been advanced into the proximal duodenum. The stomach appears decompressed. There are multiple gas distended small bowel loops identified in the upper abdomen. The lower abdomen is excluded. Visualized portions of colon are nondilated. There is no convincing free air. Visualized lung bases  unremarkable. IMPRESSION: 1. Nasogastric tube advancement into the proximal duodenum, with persistent small bowel distention. Electronically Signed   By: Lucrezia Europe M.D.   On: 09/01/2018 07:57   Dg Abd 1 View  Result Date: 08/30/2018 CLINICAL DATA:  Small bowel obstruction EXAM: ABDOMEN - 1 VIEW COMPARISON:  August 29, 2018 FINDINGS: The NG tube terminates in the proximal stomach. Small bowel dilatation is less prominent on this study but remains. No other acute abnormalities. IMPRESSION: The small bowel dilatation is less prominent but remains consistent with persistent small-bowel obstruction. The enteric tube appears to terminate in the proximal stomach. Electronically Signed   By: Dorise Bullion III M.D   On: 08/30/2018 13:22   Dg Abd 1 View  Result Date: 08/29/2018 CLINICAL DATA:  Replacement of NG tube. EXAM: ABDOMEN - 1 VIEW COMPARISON:  One-view abdomen 08/29/2018 at 8:46 a.m. FINDINGS: Side port of the NG tube is beyond the GE junction. Mildly dilated loops of small bowel are similar the prior exam. No free air is present. IMPRESSION: 1. Side port of the NG tube is beyond the GE junction. 2. Similar appearance of multiple dilated loops of small bowel without evidence of free air. Electronically Signed   By: San Morelle M.D.   On: 08/29/2018 13:08   Dg Abd 1 View  Result Date: 08/29/2018 CLINICAL DATA:  Check nasogastric catheter placement EXAM: ABDOMEN - 1 VIEW COMPARISON:  None. FINDINGS: 30 seconds of fluoroscopy was utilized for placement. The nasogastric catheter is shown coiled within the gastric lumen. IMPRESSION: Nasogastric catheter placed in the stomach. Electronically Signed   By: Inez Catalina M.D.   On: 08/29/2018 09:08   Ct Abdomen Pelvis W Contrast  Result Date: 08/28/2018 CLINICAL DATA:  Abdominal pain with shortness of breath EXAM: CT ABDOMEN AND PELVIS WITH CONTRAST TECHNIQUE: Multidetector CT imaging of the abdomen and pelvis was performed using the standard  protocol following bolus administration of intravenous contrast. CONTRAST:  179mL OMNIPAQUE IOHEXOL 300 MG/ML  SOLN COMPARISON:  None. FINDINGS: LOWER CHEST: There is no basilar pleural or apical pericardial effusion. HEPATOBILIARY: The hepatic contours and density are normal. There is no  intra- or extrahepatic biliary dilatation. There is cholelithiasis without acute inflammation. PANCREAS: The pancreatic parenchymal contours are normal and there is no ductal dilatation. There is no peripancreatic fluid collection. SPLEEN: Normal. ADRENALS/URINARY TRACT: --Adrenal glands: Normal. --Right kidney/ureter: Mild atrophy. No hydronephrosis. --Left kidney/ureter: Vascular calcifications and interpolar nonobstructive 3 mm nephrolithiasis. No hydronephrosis. --Urinary bladder: Mild bladder wall thickening, possibly due to underdistention. STOMACH/BOWEL: --Stomach/Duodenum: There is no hiatal hernia or other gastric abnormality. The duodenal course and caliber are normal. --Small bowel: There are multiple loops of dilated small bowel in the central abdomen, predominantly anteriorly. One of these loops is directly beneath the skin surface, extending through a diastatic defect of the anterior abdominal muscles. Suspected transition point is in the anterior low midline abdomen (coronal image 24 and sagittal image 70). --Colon: No focal abnormality. --Appendix: Not visualized. No right lower quadrant inflammation or free fluid. VASCULAR/LYMPHATIC: Atherosclerotic calcification is present within the non-aneurysmal abdominal aorta, without hemodynamically significant stenosis. The portal vein, splenic vein, superior mesenteric vein and IVC are patent. No abdominal or pelvic lymphadenopathy. REPRODUCTIVE: Normal prostate size with symmetric seminal vesicles. MUSCULOSKELETAL. Compression deformity of L2 is probably chronic. There is grade 1 L4-5 anterolisthesis secondary to facet arthrosis. OTHER: None. IMPRESSION: 1. Small bowel  obstruction with suspected transition point in the low anterior midline abdomen. 2. Cholelithiasis without acute inflammation. 3. Nonobstructing left nephrolithiasis. Aortic Atherosclerosis (ICD10-I70.0). Electronically Signed   By: Ulyses Jarred M.D.   On: 08/28/2018 23:21   Dg Chest Port 1 View  Result Date: 08/28/2018 CLINICAL DATA:  77 y/o  M; abdominal pain and shortness of breath. EXAM: PORTABLE CHEST 1 VIEW COMPARISON:  None. FINDINGS: Normal cardiac silhouette. Aortic atherosclerosis with calcification. Perihilar and basilar reticular opacities. No consolidation. No pleural effusion or pneumothorax. No acute osseous abnormality is evident. IMPRESSION: Basilar reticular opacities may represent mild interstitial edema or atypical pneumonia. No consolidation. Electronically Signed   By: Kristine Garbe M.D.   On: 08/28/2018 21:55   Dg Abd Portable 1v  Result Date: 09/09/2018 CLINICAL DATA:  Follow-up small bowel obstruction EXAM: PORTABLE ABDOMEN - 1 VIEW COMPARISON:  09/06/2018 FINDINGS: No nasogastric tube identified. Previous midline laparotomy. Moderate amount of gas and stool within the colon. No dilated small bowel loops identified. IMPRESSION: Moderate amount of gas and stool in the colon. Electronically Signed   By: Nelson Chimes M.D.   On: 09/09/2018 08:31   Dg Abd Portable 1v  Result Date: 09/05/2018 CLINICAL DATA:  Ongoing abdomen pain and distention. EXAM: PORTABLE ABDOMEN - 1 VIEW COMPARISON:  09/04/2018. FINDINGS: Nasogastric tube has been removed. There has been persistence of and perhaps mild progression of dilated small bowel loops along with inspissated contrast throughout a distended colon. IMPRESSION: Slight progression of dilated small bowel loops along with inspissated contrast throughout a distended colon. Findings remain consistent with a partial small bowel obstruction. Electronically Signed   By: Staci Righter M.D.   On: 09/05/2018 10:43   Dg Abd Portable  1v  Result Date: 09/04/2018 CLINICAL DATA:  Follow up small bowel obstruction EXAM: PORTABLE ABDOMEN - 1 VIEW COMPARISON:  09/03/2018 FINDINGS: Nasogastric catheter is noted in the second portion of the duodenum stable from the prior exam. Scattered large and small bowel gas is noted. There is been a slight decrease in the degree of small bowel dilatation when compared with the previous exam. No free air is noted. Postsurgical changes in the left hip are again seen. IMPRESSION: Improvement in the degree of small bowel dilatation  when compared with the previous day. Continued follow-up is recommended. Electronically Signed   By: Inez Catalina M.D.   On: 09/04/2018 08:46   Dg Abd Portable 1v  Result Date: 08/29/2018 CLINICAL DATA:  Small bowel obstruction, 8 hour delayed film after Gastrografin EXAM: PORTABLE ABDOMEN - 1 VIEW COMPARISON:  Abdominal radiograph from earlier today FINDINGS: Enteric tube terminates in the proximal stomach. Moderately dilated small bowel loops throughout the abdomen are not appreciably changed. Oral contrast transits to the mid small bowel. No definite oral contrast in the colon. Moderate colonic stool. No evidence of pneumatosis or pneumoperitoneum. Clear lung bases. IMPRESSION: 1. Enteric tube terminates in the stomach. 2. Stable moderately dilated small bowel loops compatible with small-bowel obstruction. Oral contrast transits to the mid small bowel, with no definite oral contrast in the colon. Electronically Signed   By: Ilona Sorrel M.D.   On: 08/29/2018 21:38   Dg Abd Portable 1v-small Bowel Obstruction Protocol-initial, 8 Hr Delay  Result Date: 08/29/2018 CLINICAL DATA:  Follow up small bowel obstruction EXAM: PORTABLE ABDOMEN - 1 VIEW COMPARISON:  08/28/2018 FINDINGS: Nasogastric catheter is now noted within the stomach. Fecal material is noted throughout the colon. Contrast is seen within the bladder from recent CT examination. Multiple dilated small bowel loops are  again identified in the mid abdomen stable from the recent exam. No acute bony abnormality is noted. Prior L2 compression deformity is noted. IMPRESSION: Stable small bowel dilatation following nasogastric catheter placement Electronically Signed   By: Inez Catalina M.D.   On: 08/29/2018 09:09   Dg Abd Portable 2v  Result Date: 09/02/2018 CLINICAL DATA:  Initial evaluation for possible foreign body, missing piece of NG tube. EXAM: PORTABLE ABDOMEN - 2 VIEW COMPARISON:  Prior radiograph from 09/01/2018. FINDINGS: Previously seen enteric tube has been removed. No visible tube fragment or other radiopaque foreign body identified. Persistent small bowel dilatation noted within the upper abdomen, suggestive of ongoing small bowel obstruction. This appears improved from previous. Partially visualized lung bases are grossly clear. IMPRESSION: 1. No visible tube fragment or other radiopaque foreign body identified. 2. Persistent but improved small bowel obstruction. Electronically Signed   By: Jeannine Boga M.D.   On: 09/02/2018 05:52   Dg Addison Bailey G Tube Plc W/fl W/rad  Result Date: 09/03/2018 CLINICAL DATA:  Were acquires nasogastric tube placement. EXAM: NASO G TUBE PLACEMENT WITH FL AND WITH RAD CONTRAST:  Water-soluble contrast FLUOROSCOPY TIME:  Fluoroscopy Time:  1 minutes 12 seconds Radiation Exposure Index (if provided by the fluoroscopic device): Number of Acquired Spot Images: 2 COMPARISON:  Earlier today FINDINGS: Patient's indwelling weighted feeding tube was removed. A nasogastric tube was then placed through the patient's right nares and under direct fluoroscopic guidance was manipulated into the stomach. The tube was in extended into the duodenum. A small volume of water-soluble contrast material was injected into the nasogastric tube in the tip terminates at the distal portion of the descending duodenum. IMPRESSION: Removal of indwelling weighted feeding tube with subsequent fluoroscopic guided  placement of nasogastric tube into the proximal duodenum. Contrast injected into the nasogastric tube confirms tip location at the distal portion of the descending duodenum. Electronically Signed   By: Kerby Moors M.D.   On: 09/03/2018 12:51   Dg Addison Bailey G Tube Plc W/fl W/rad  Result Date: 09/02/2018 CLINICAL DATA:  NG tube placement. EXAM: NASO G TUBE PLACEMENT WITH FL AND WITH RAD CONTRAST:  Water-soluble contrast FLUOROSCOPY TIME:  Fluoroscopy Time:  5 minutes 48  seconds Radiation Exposure Index (if provided by the fluoroscopic device): Number of Acquired Spot Images: 2 COMPARISON:  09/02/2018 FINDINGS: Under direct fluoroscopic guidance a weighted nasogastric tube was placed into the right nares. The tube was then guided into the esophagus, past the GE junction and into the gastric lumen. The tube was then manipulated into the third portion of the duodenum. A small volume of water-soluble contrast material was injected into the tube confirming placement of the tube at the level of the duodenal jejunal junction. IMPRESSION: 1. Successful fluoroscopic guided weighted nasogastric tube placement. The tip is at the level of the duodenal jejunal junction and is ready for use. Electronically Signed   By: Kerby Moors M.D.   On: 09/02/2018 11:43   [place] Ekg Site Rite  Result Date: 09/01/2018 If Site Rite image not attached, placement could not be confirmed due to current cardiac rhythm.    LOS: 12 days   Signature  Lala Lund M.D on 09/09/2018 at 10:25 AM   -  To page go to [email or web address] - password [facility]

## 2018-09-09 NOTE — Progress Notes (Signed)
NG tube came out during transfer from bed to chair. MD notified. Awaiting orders. Will continue to monitor.

## 2018-09-10 LAB — COMPREHENSIVE METABOLIC PANEL
ALT: 25 U/L (ref 0–44)
AST: 19 U/L (ref 15–41)
Albumin: 2.5 g/dL — ABNORMAL LOW (ref 3.5–5.0)
Alkaline Phosphatase: 77 U/L (ref 38–126)
Anion gap: 7 (ref 5–15)
BILIRUBIN TOTAL: 0.5 mg/dL (ref 0.3–1.2)
BUN: 38 mg/dL — ABNORMAL HIGH (ref 8–23)
CO2: 23 mmol/L (ref 22–32)
Calcium: 9.4 mg/dL (ref 8.9–10.3)
Chloride: 110 mmol/L (ref 98–111)
Creatinine, Ser: 1.1 mg/dL (ref 0.61–1.24)
GFR calc Af Amer: >60 mL/min (ref >60)
GFR calc non Af Amer: >60 mL/min (ref >60)
Glucose, Bld: 127 mg/dL — ABNORMAL HIGH (ref 70–99)
Potassium: 4.2 mmol/L (ref 3.5–5.1)
Sodium: 140 mmol/L (ref 135–145)
TOTAL PROTEIN: 7.1 g/dL (ref 6.5–8.1)

## 2018-09-10 LAB — DIFFERENTIAL
Abs Immature Granulocytes: 0.21 10*3/uL — ABNORMAL HIGH (ref 0.00–0.07)
Basophils Absolute: 0.1 10*3/uL (ref 0.0–0.1)
Basophils Relative: 1 %
EOS PCT: 7 %
Eosinophils Absolute: 0.8 10*3/uL — ABNORMAL HIGH (ref 0.0–0.5)
Immature Granulocytes: 2 %
LYMPHS ABS: 1.4 10*3/uL (ref 0.7–4.0)
Lymphocytes Relative: 12 %
Monocytes Absolute: 1.1 10*3/uL — ABNORMAL HIGH (ref 0.1–1.0)
Monocytes Relative: 9 %
Neutro Abs: 8.4 10*3/uL — ABNORMAL HIGH (ref 1.7–7.7)
Neutrophils Relative %: 69 %

## 2018-09-10 LAB — CBC
HCT: 37.2 % — ABNORMAL LOW (ref 39.0–52.0)
Hemoglobin: 11.1 g/dL — ABNORMAL LOW (ref 13.0–17.0)
MCH: 27.4 pg (ref 26.0–34.0)
MCHC: 29.8 g/dL — AB (ref 30.0–36.0)
MCV: 91.9 fL (ref 80.0–100.0)
Platelets: 327 10*3/uL (ref 150–400)
RBC: 4.05 MIL/uL — ABNORMAL LOW (ref 4.22–5.81)
RDW: 13.4 % (ref 11.5–15.5)
WBC: 11.9 10*3/uL — ABNORMAL HIGH (ref 4.0–10.5)
nRBC: 0 % (ref 0.0–0.2)

## 2018-09-10 LAB — TRIGLYCERIDES: Triglycerides: 145 mg/dL (ref <150)

## 2018-09-10 LAB — GLUCOSE, CAPILLARY
GLUCOSE-CAPILLARY: 125 mg/dL — AB (ref 70–99)
Glucose-Capillary: 110 mg/dL — ABNORMAL HIGH (ref 70–99)
Glucose-Capillary: 117 mg/dL — ABNORMAL HIGH (ref 70–99)
Glucose-Capillary: 126 mg/dL — ABNORMAL HIGH (ref 70–99)
Glucose-Capillary: 130 mg/dL — ABNORMAL HIGH (ref 70–99)
Glucose-Capillary: 130 mg/dL — ABNORMAL HIGH (ref 70–99)

## 2018-09-10 LAB — MAGNESIUM: Magnesium: 2.4 mg/dL (ref 1.7–2.4)

## 2018-09-10 LAB — PHOSPHORUS: Phosphorus: 3.4 mg/dL (ref 2.5–4.6)

## 2018-09-10 LAB — PREALBUMIN: PREALBUMIN: 21.8 mg/dL (ref 18–38)

## 2018-09-10 MED ORDER — TRAVASOL 10 % IV SOLN
INTRAVENOUS | Status: DC
  Administered 2018-09-10: 17:00:00 via INTRAVENOUS
  Filled 2018-09-10: qty 566.4

## 2018-09-10 MED ORDER — ENSURE ENLIVE PO LIQD
237.0000 mL | Freq: Two times a day (BID) | ORAL | Status: DC
  Administered 2018-09-10 – 2018-09-13 (×4): 237 mL via ORAL

## 2018-09-10 NOTE — Progress Notes (Signed)
PROGRESS NOTE        PATIENT DETAILS Name: Luis Lyons Age: 77 y.o. Sex: male Date of Birth: 1940-12-06 Admit Date: 08/28/2018 Admitting Physician [name], MD QPR:[id number], [name], MD  Brief Narrative: 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 Upon further evaluation he was found to have pneumonia and a small bowel obstruction.  Unfortunately patient's obstruction was persistent even with conservative management and underwent laparotomy with lysis of adhesions on 12/4.   Subjective:  Patient in bed, appears comfortable, denies any headache, no fever, no chest pain or pressure, no shortness of breath , no abdominal pain. No focal weakness. 2 BMs in 24hrs.    Assessment/Plan:  SBO: Underwent exploratory laparotomy and lysis of adhesions on 12/4.  Clear improving, on TNA via right arm PICC line.  Thankfully he is now having bowel movements he had 2 BMs in the last 24 hours on 09/10/2018, NG tube was pulled already on 09/09/2018, abdominal exam is benign, discussed with general surgery on 09/10/2018, advance activity, advance diet and monitor.  TNA in half.  If oral intake is stable and tolerated will stop TNA.  Leukocytosis: Likely reactive from recent surgery and ongoing ileus-has almost resolved.  Recent chest x-ray on 12/21 showed no pneumonia.  Remains afebrile.    Pneumonia: Has chronic dysphagia due to oropharyngeal surgery in the past, pneumonia likely was due to aspiration.  Has completed antibiotic treatment.  Continue to monitor clinically.  Flutter valve and I-S for pulmonary toiletry to be continued.  Encouraged to sit up in chair as much as possible.    AKI: Due to dehydration completely resolved.  COPD: Has significant upper airway  sounds are transmitted-wheezing occasionally-continue bronchodilators, Lasix x1 today.  Continue flutter valve/spirometry/mobilization.  History of CVA with chronic left-sided weakness: Remains at baseline-plans are to resume aspirin once oral intake has been resumed.  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  Antimicrobial agents: Anti-infectives (From admission, onward)   Start     Dose/Rate Route Frequency Ordered Stop   09/05/18 1100  ceFAZolin (ANCEF) IVPB 2g/100 mL premix     2 g 200 mL/hr over 30 Minutes Intravenous  Once 09/05/18 1054 09/05/18 1111   09/05/18 1054  ceFAZolin (ANCEF) 2-4 GM/100ML-% IVPB    Note to Pharmacy:  [name]   : cabinet override      09/05/18 1054 09/05/18 1111   08/28/18 2230  cefTRIAXone (ROCEPHIN) 2 g in sodium chloride 0.9 % 100 mL IVPB     2 g 200 mL/hr over 30 Minutes Intravenous Every 24 hours 08/28/18 2215 09/03/18 2232   08/28/18 2230  azithromycin (ZITHROMAX) 500 mg in sodium chloride 0.9 % 250 mL IVPB     500 mg 250 mL/hr over 60 Minutes Intravenous Every 24 hours 08/28/18 2215 09/01/18 2229      Procedures: 12/4>> laparotomy with lysis of adhesions 11/30>>PICC  CONSULTS:  general surgery  Time spent: 25- minutes-Greater than 50% of this time was spent in counseling, explanation of diagnosis, planning of further management, and coordination of care.  MEDICATIONS: Scheduled Meds: . arformoterol  15 mcg Nebulization BID  . budesonide (PULMICORT) nebulizer solution  0.25 mg Nebulization BID  . enoxaparin (LOVENOX) injection  40 mg Subcutaneous Q24H  .  insulin aspart  0-9 Units Subcutaneous Q6H  . ipratropium-albuterol  3 mL Nebulization BID  . mouth rinse  15 mL Mouth Rinse BID  . pantoprazole (PROTONIX) IV  40 mg Intravenous Q24H  . polyvinyl alcohol  1 drop Both Eyes BID   Continuous Infusions: . TPN ADULT (ION) 85 mL/hr at 09/09/18 1758  . TPN ADULT (ION)     PRN Meds:.albuterol, hydrALAZINE, HYDROmorphone (DILAUDID) injection, menthol-cetylpyridinium, ondansetron **OR** ondansetron (ZOFRAN) IV, oxyCODONE, sodium chloride flush   PHYSICAL EXAM: Vital signs: Vitals:   09/09/18 2151 09/10/18 0400 09/10/18 0618 09/10/18 0852  BP: (!) 155/58  (!) 166/64   Pulse: 84  80   Resp:      Temp: 97.8 F (36.6 C)  97.9 F (36.6 C)   TempSrc:      SpO2: 99%  99% 96%  Weight:  91.9 kg    Height:       Filed Weights   09/06/18 0500 09/08/18 1655 09/10/18 0400  Weight: 96 kg 92.4 kg 91.9 kg   Body mass index is 27.48 kg/m.   Exam  Awake Alert, Oriented X 3, No new F.N deficits, Normal affect [place].AT,PERRAL Supple Neck,No JVD, No cervical lymphadenopathy appriciated.  Symmetrical Chest wall movement, Good air movement bilaterally, CTAB RRR,No Gallops, Rubs or new Murmurs, No Parasternal Heave Positive bowel sounds, midline laparotomy scar under bandage, NG tube in place, right arm PICC. No Cyanosis, Clubbing or edema, No new Rash or bruise    I have personally reviewed following labs and imaging studies  LABORATORY DATA: CBC: Recent Labs  Lab 09/06/18 0414 09/07/18 0915 09/08/18 0340 09/10/18 0351  WBC 20.0* 14.7* 11.4* 11.9*  NEUTROABS  --   --   --  8.4*  HGB 11.5* 11.1* 10.8* 11.1*  HCT 37.6* 35.5* 34.4* 37.2*  MCV 90.6 89.9 89.1 91.9  PLT 314 260 261 818    Basic Metabolic Panel: Recent Labs  Lab 09/04/18 0404  09/06/18 0414 09/07/18 0336 09/08/18 0340 09/09/18 0331 09/10/18 0351  NA 138   < > 135 136 135 137 140  K 4.0   < > 4.8 4.4 3.7 3.9 4.2  CL 108   < > 106 106 106 104 110  CO2 23   < > 21* 22  22 23 23   GLUCOSE 127*   < > 134* 128* 137* 141* 127*  BUN 15   < > 28* 25* 31* 39* 38*  CREATININE 0.91   < > 0.96 1.06 1.05 1.29* 1.10  CALCIUM 8.5*   < > 8.9 8.7* 8.9 9.4 9.4  MG 2.2  --  2.3  --   --   --  2.4  PHOS 2.9  --  3.7  --   --   --  3.4   < > = values in this interval not displayed.    GFR: Estimated Creatinine Clearance: 62.7 mL/min (by C-G formula based on SCr of 1.1 mg/dL).  Liver Function Tests: Recent Labs  Lab 09/06/18 0414 09/10/18 0351  AST 39 19  ALT 32 25  ALKPHOS 54 77  BILITOT 0.4 0.5  PROT 6.8 7.1  ALBUMIN 2.5* 2.5*   No results for input(s): LIPASE, AMYLASE in the last 168 hours. No results for input(s): AMMONIA in the last 168 hours.  Coagulation Profile: No results for input(s): INR, PROTIME in the last 168 hours.  Cardiac Enzymes: No results for input(s): CKTOTAL, CKMB, CKMBINDEX, TROPONINI in the last 168 hours.  BNP (last 3 results) No results for input(s): PROBNP in the last 8760 hours.  HbA1C: No results for input(s): HGBA1C in the last 72 hours.  CBG: Recent Labs  Lab 09/09/18 1241 09/09/18 1645 09/10/18 0009 09/10/18 0619 09/10/18 0825  GLUCAP 116* 125* 130* 130* 110*    Lipid Profile: Recent Labs    09/10/18 0351  TRIG 145    Thyroid Function Tests: No results for input(s): TSH, T4TOTAL, FREET4, T3FREE, THYROIDAB in the last 72 hours.  Anemia Panel: No results for input(s): VITAMINB12, FOLATE, FERRITIN, TIBC, IRON, RETICCTPCT in the last 72 hours.  Urine analysis:    Component Value Date/Time   COLORURINE YELLOW 08/30/2018 1245   APPEARANCEUR HAZY (A) 08/30/2018 1245   LABSPEC 1.027 08/30/2018 1245   PHURINE 5.0 08/30/2018 1245   GLUCOSEU NEGATIVE 08/30/2018 1245   HGBUR SMALL (A) 08/30/2018 1245   BILIRUBINUR NEGATIVE 08/30/2018 1245   KETONESUR 5 (A) 08/30/2018 1245   PROTEINUR 30 (A) 08/30/2018 1245   NITRITE POSITIVE (A) 08/30/2018 1245   LEUKOCYTESUR MODERATE (A) 08/30/2018 1245    Sepsis  Labs: Lactic Acid, Venous    Component Value Date/Time   LATICACIDVEN 1.86 08/28/2018 2153    MICROBIOLOGY: Recent Results (from the past 240 hour(s))  Surgical pcr screen     Status: None   Collection Time: 09/05/18  9:54 AM  Result Value Ref Range Status   MRSA, PCR NEGATIVE NEGATIVE Final   Staphylococcus aureus NEGATIVE NEGATIVE Final    Comment: (NOTE) The Xpert SA Assay (FDA approved for NASAL specimens in patients 1 years of age and older), is one component of a comprehensive surveillance program. It is not intended to diagnose infection nor to guide or monitor treatment. Performed at [facility], [place] [address], [place], [place] [address]     RADIOLOGY STUDIES/RESULTS: Dg Chest 1 View  Result Date: 09/02/2018 CLINICAL DATA:  Wheezing. EXAM: CHEST  1 VIEW COMPARISON:  09/02/2018 FINDINGS: Right arm PICC line tip is in the cavoatrial junction. There is a feeding tube in place. Heart size normal. No pleural effusion or edema. No airspace opacities identified. IMPRESSION: No acute cardiopulmonary abnormalities. Electronically Signed   By: Kerby Moors M.D.   On: 09/02/2018 15:46   Dg Abd 1 View  Result Date: 09/06/2018 CLINICAL DATA:  Nasogastric tube placement. EXAM: ABDOMEN - 1 VIEW COMPARISON:  09/05/2018. FINDINGS: NG tube noted with tip stomach. No bowel distention noted. Degenerative change thoracolumbar spine IMPRESSION: NG tube noted with tip in stomach. Electronically Signed   By: Marcello Moores  Register   On: 09/06/2018 12:40   X-ray Abdomen Ap  Result Date: 09/05/2018 CLINICAL DATA:  Encounter for NG tube placement. EXAM: ABDOMEN - 1 VIEW COMPARISON:  None. FINDINGS: The radiograph is suboptimal, underpenetrated and includes overlapping shadows of the patient's RIGHT hand. A tube projects over the lower thoracic region, with its tip likely in the distal esophagus, but not in the stomach. Nonobstructive gas pattern. Surgical clips overlie the RIGHT abdomen.  IMPRESSION: Suboptimal radiograph. Nasogastric tube is not in the stomach, with tip appearing to project over distal esophagus. Recommend repositioning the tube, with repeat imaging. Electronically Signed  By: Staci Righter M.D.   On: 09/05/2018 15:12   Dg Abd 1 View  Result Date: 09/03/2018 CLINICAL DATA:  Small-bowel obstruction.  NG tube. EXAM: ABDOMEN - 1 VIEW COMPARISON:  Abdominal radiographic series-09/02/2018; 09/01/2018; 08/29/2018; CT abdomen pelvis-11/26/fluoroscopic guided nasogastric tube placement-09/02/2018 FINDINGS: There is been apparent exchange of existing weighted tip feeding tube with new enteric tube tip overlying the expected location of the descending portion of the duodenum. Re demonstrated moderate gaseous distension of several rather patulous loops of small bowel, unchanged to slightly improved compared to the 09/02/2018 examination with index loop of small bowel in the right mid/lower abdomen measuring 5.6 cm in diameter. Nondiagnostic evaluation for pneumoperitoneum secondary to supine positioning exclusion of the lower thorax. Stigmata of DISH throughout the thoracic spine. Post ORIF of the left femur and femoral neck. IMPRESSION: 1. No change to slight improvement in findings again concerning for small bowel obstruction. 2. Apparent change of enteric tube with new enteric tube tip terminating over the expected location of the descending portion duodenum. Electronically Signed   By: Sandi Mariscal M.D.   On: 09/03/2018 07:46   Dg Abd 1 View  Result Date: 09/01/2018 CLINICAL DATA:  SBO, upright image requested by MD. EXAM: ABDOMEN - 1 VIEW COMPARISON:  08/30/2018 FINDINGS: Nasogastric tube has been advanced into the proximal duodenum. The stomach appears decompressed. There are multiple gas distended small bowel loops identified in the upper abdomen. The lower abdomen is excluded. Visualized portions of colon are nondilated. There is no convincing free air. Visualized lung bases  unremarkable. IMPRESSION: 1. Nasogastric tube advancement into the proximal duodenum, with persistent small bowel distention. Electronically Signed   By: Lucrezia Europe M.D.   On: 09/01/2018 07:57   Dg Abd 1 View  Result Date: 08/30/2018 CLINICAL DATA:  Small bowel obstruction EXAM: ABDOMEN - 1 VIEW COMPARISON:  August 29, 2018 FINDINGS: The NG tube terminates in the proximal stomach. Small bowel dilatation is less prominent on this study but remains. No other acute abnormalities. IMPRESSION: The small bowel dilatation is less prominent but remains consistent with persistent small-bowel obstruction. The enteric tube appears to terminate in the proximal stomach. Electronically Signed   By: Dorise Bullion III M.D   On: 08/30/2018 13:22   Dg Abd 1 View  Result Date: 08/29/2018 CLINICAL DATA:  Replacement of NG tube. EXAM: ABDOMEN - 1 VIEW COMPARISON:  One-view abdomen 08/29/2018 at 8:46 a.m. FINDINGS: Side port of the NG tube is beyond the GE junction. Mildly dilated loops of small bowel are similar the prior exam. No free air is present. IMPRESSION: 1. Side port of the NG tube is beyond the GE junction. 2. Similar appearance of multiple dilated loops of small bowel without evidence of free air. Electronically Signed   By: San Morelle M.D.   On: 08/29/2018 13:08   Dg Abd 1 View  Result Date: 08/29/2018 CLINICAL DATA:  Check nasogastric catheter placement EXAM: ABDOMEN - 1 VIEW COMPARISON:  None. FINDINGS: 30 seconds of fluoroscopy was utilized for placement. The nasogastric catheter is shown coiled within the gastric lumen. IMPRESSION: Nasogastric catheter placed in the stomach. Electronically Signed   By: Inez Catalina M.D.   On: 08/29/2018 09:08   Ct Abdomen Pelvis W Contrast  Result Date: 08/28/2018 CLINICAL DATA:  Abdominal pain with shortness of breath EXAM: CT ABDOMEN AND PELVIS WITH CONTRAST TECHNIQUE: Multidetector CT imaging of the abdomen and pelvis was performed using the standard  protocol following bolus administration of intravenous contrast.  CONTRAST:  126mL OMNIPAQUE IOHEXOL 300 MG/ML  SOLN COMPARISON:  None. FINDINGS: LOWER CHEST: There is no basilar pleural or apical pericardial effusion. HEPATOBILIARY: The hepatic contours and density are normal. There is no intra- or extrahepatic biliary dilatation. There is cholelithiasis without acute inflammation. PANCREAS: The pancreatic parenchymal contours are normal and there is no ductal dilatation. There is no peripancreatic fluid collection. SPLEEN: Normal. ADRENALS/URINARY TRACT: --Adrenal glands: Normal. --Right kidney/ureter: Mild atrophy. No hydronephrosis. --Left kidney/ureter: Vascular calcifications and interpolar nonobstructive 3 mm nephrolithiasis. No hydronephrosis. --Urinary bladder: Mild bladder wall thickening, possibly due to underdistention. STOMACH/BOWEL: --Stomach/Duodenum: There is no hiatal hernia or other gastric abnormality. The duodenal course and caliber are normal. --Small bowel: There are multiple loops of dilated small bowel in the central abdomen, predominantly anteriorly. One of these loops is directly beneath the skin surface, extending through a diastatic defect of the anterior abdominal muscles. Suspected transition point is in the anterior low midline abdomen (coronal image 24 and sagittal image 70). --Colon: No focal abnormality. --Appendix: Not visualized. No right lower quadrant inflammation or free fluid. VASCULAR/LYMPHATIC: Atherosclerotic calcification is present within the non-aneurysmal abdominal aorta, without hemodynamically significant stenosis. The portal vein, splenic vein, superior mesenteric vein and IVC are patent. No abdominal or pelvic lymphadenopathy. REPRODUCTIVE: Normal prostate size with symmetric seminal vesicles. MUSCULOSKELETAL. Compression deformity of L2 is probably chronic. There is grade 1 L4-5 anterolisthesis secondary to facet arthrosis. OTHER: None. IMPRESSION: 1. Small bowel  obstruction with suspected transition point in the low anterior midline abdomen. 2. Cholelithiasis without acute inflammation. 3. Nonobstructing left nephrolithiasis. Aortic Atherosclerosis (ICD10-I70.0). Electronically Signed   By: Ulyses Jarred M.D.   On: 08/28/2018 23:21   Dg Chest Port 1 View  Result Date: 08/28/2018 CLINICAL DATA:  77 y/o  M; abdominal pain and shortness of breath. EXAM: PORTABLE CHEST 1 VIEW COMPARISON:  None. FINDINGS: Normal cardiac silhouette. Aortic atherosclerosis with calcification. Perihilar and basilar reticular opacities. No consolidation. No pleural effusion or pneumothorax. No acute osseous abnormality is evident. IMPRESSION: Basilar reticular opacities may represent mild interstitial edema or atypical pneumonia. No consolidation. Electronically Signed   By: Kristine Garbe M.D.   On: 08/28/2018 21:55   Dg Abd Portable 1v  Result Date: 09/09/2018 CLINICAL DATA:  Follow-up small bowel obstruction EXAM: PORTABLE ABDOMEN - 1 VIEW COMPARISON:  09/06/2018 FINDINGS: No nasogastric tube identified. Previous midline laparotomy. Moderate amount of gas and stool within the colon. No dilated small bowel loops identified. IMPRESSION: Moderate amount of gas and stool in the colon. Electronically Signed   By: Nelson Chimes M.D.   On: 09/09/2018 08:31   Dg Abd Portable 1v  Result Date: 09/05/2018 CLINICAL DATA:  Ongoing abdomen pain and distention. EXAM: PORTABLE ABDOMEN - 1 VIEW COMPARISON:  09/04/2018. FINDINGS: Nasogastric tube has been removed. There has been persistence of and perhaps mild progression of dilated small bowel loops along with inspissated contrast throughout a distended colon. IMPRESSION: Slight progression of dilated small bowel loops along with inspissated contrast throughout a distended colon. Findings remain consistent with a partial small bowel obstruction. Electronically Signed   By: Staci Righter M.D.   On: 09/05/2018 10:43   Dg Abd Portable  1v  Result Date: 09/04/2018 CLINICAL DATA:  Follow up small bowel obstruction EXAM: PORTABLE ABDOMEN - 1 VIEW COMPARISON:  09/03/2018 FINDINGS: Nasogastric catheter is noted in the second portion of the duodenum stable from the prior exam. Scattered large and small bowel gas is noted. There is been a slight decrease in  the degree of small bowel dilatation when compared with the previous exam. No free air is noted. Postsurgical changes in the left hip are again seen. IMPRESSION: Improvement in the degree of small bowel dilatation when compared with the previous day. Continued follow-up is recommended. Electronically Signed   By: Inez Catalina M.D.   On: 09/04/2018 08:46   Dg Abd Portable 1v  Result Date: 08/29/2018 CLINICAL DATA:  Small bowel obstruction, 8 hour delayed film after Gastrografin EXAM: PORTABLE ABDOMEN - 1 VIEW COMPARISON:  Abdominal radiograph from earlier today FINDINGS: Enteric tube terminates in the proximal stomach. Moderately dilated small bowel loops throughout the abdomen are not appreciably changed. Oral contrast transits to the mid small bowel. No definite oral contrast in the colon. Moderate colonic stool. No evidence of pneumatosis or pneumoperitoneum. Clear lung bases. IMPRESSION: 1. Enteric tube terminates in the stomach. 2. Stable moderately dilated small bowel loops compatible with small-bowel obstruction. Oral contrast transits to the mid small bowel, with no definite oral contrast in the colon. Electronically Signed   By: Ilona Sorrel M.D.   On: 08/29/2018 21:38   Dg Abd Portable 1v-small Bowel Obstruction Protocol-initial, 8 Hr Delay  Result Date: 08/29/2018 CLINICAL DATA:  Follow up small bowel obstruction EXAM: PORTABLE ABDOMEN - 1 VIEW COMPARISON:  08/28/2018 FINDINGS: Nasogastric catheter is now noted within the stomach. Fecal material is noted throughout the colon. Contrast is seen within the bladder from recent CT examination. Multiple dilated small bowel loops are  again identified in the mid abdomen stable from the recent exam. No acute bony abnormality is noted. Prior L2 compression deformity is noted. IMPRESSION: Stable small bowel dilatation following nasogastric catheter placement Electronically Signed   By: Inez Catalina M.D.   On: 08/29/2018 09:09   Dg Abd Portable 2v  Result Date: 09/02/2018 CLINICAL DATA:  Initial evaluation for possible foreign body, missing piece of NG tube. EXAM: PORTABLE ABDOMEN - 2 VIEW COMPARISON:  Prior radiograph from 09/01/2018. FINDINGS: Previously seen enteric tube has been removed. No visible tube fragment or other radiopaque foreign body identified. Persistent small bowel dilatation noted within the upper abdomen, suggestive of ongoing small bowel obstruction. This appears improved from previous. Partially visualized lung bases are grossly clear. IMPRESSION: 1. No visible tube fragment or other radiopaque foreign body identified. 2. Persistent but improved small bowel obstruction. Electronically Signed   By: Jeannine Boga M.D.   On: 09/02/2018 05:52   Dg Addison Bailey G Tube Plc W/fl W/rad  Result Date: 09/03/2018 CLINICAL DATA:  Were acquires nasogastric tube placement. EXAM: NASO G TUBE PLACEMENT WITH FL AND WITH RAD CONTRAST:  Water-soluble contrast FLUOROSCOPY TIME:  Fluoroscopy Time:  1 minutes 12 seconds Radiation Exposure Index (if provided by the fluoroscopic device): Number of Acquired Spot Images: 2 COMPARISON:  Earlier today FINDINGS: Patient's indwelling weighted feeding tube was removed. A nasogastric tube was then placed through the patient's right nares and under direct fluoroscopic guidance was manipulated into the stomach. The tube was in extended into the duodenum. A small volume of water-soluble contrast material was injected into the nasogastric tube in the tip terminates at the distal portion of the descending duodenum. IMPRESSION: Removal of indwelling weighted feeding tube with subsequent fluoroscopic guided  placement of nasogastric tube into the proximal duodenum. Contrast injected into the nasogastric tube confirms tip location at the distal portion of the descending duodenum. Electronically Signed   By: Kerby Moors M.D.   On: 09/03/2018 12:51   Dg Addison Bailey G Tube Plc  W/fl W/rad  Result Date: 09/02/2018 CLINICAL DATA:  NG tube placement. EXAM: NASO G TUBE PLACEMENT WITH FL AND WITH RAD CONTRAST:  Water-soluble contrast FLUOROSCOPY TIME:  Fluoroscopy Time:  5 minutes 48 seconds Radiation Exposure Index (if provided by the fluoroscopic device): Number of Acquired Spot Images: 2 COMPARISON:  09/02/2018 FINDINGS: Under direct fluoroscopic guidance a weighted nasogastric tube was placed into the right nares. The tube was then guided into the esophagus, past the GE junction and into the gastric lumen. The tube was then manipulated into the third portion of the duodenum. A small volume of water-soluble contrast material was injected into the tube confirming placement of the tube at the level of the duodenal jejunal junction. IMPRESSION: 1. Successful fluoroscopic guided weighted nasogastric tube placement. The tip is at the level of the duodenal jejunal junction and is ready for use. Electronically Signed   By: Kerby Moors M.D.   On: 09/02/2018 11:43   [place] Ekg Site Rite  Result Date: 09/01/2018 If Site Rite image not attached, placement could not be confirmed due to current cardiac rhythm.    LOS: 13 days   Signature  Lala Lund M.D on 09/10/2018 at 10:56 AM   -  To page go to [email or web address] - password [facility]

## 2018-09-10 NOTE — Evaluation (Signed)
Clinical/Bedside Swallow Evaluation Patient Details  Name: Luis Lyons MRN: [id number] Date of Birth: [birth year]  Today's Date: 09/10/2018 Time: SLP Start Time (ACUTE ONLY): 0956 SLP Stop Time (ACUTE ONLY): 1010 SLP Time Calculation (min) (ACUTE ONLY): 14 min  Past Medical History:  Past Medical History:  Diagnosis Date  . Abdominal hernia   . Cancer ([place])   . Constipation   . Cough   . Depression   . Dry mouth   . Hard of hearing   . Hypertension   . Muscle weakness   . Stroke ([place])   . Urinary incontinence   . Vitamin D deficiency   . Vomiting   . Wheezing    Past Surgical History:  Past Surgical History:  Procedure Laterality Date  . ABDOMINAL SURGERY    . LAPAROTOMY N/A 09/05/2018   Procedure: EXPLORATORY LAPAROTOMY;  Surgeon: Georganna Skeans, MD;  Location: [facility];  Service: General;  Laterality: N/A;   HPI:  Pt is a 77 y/o male admitted secondary to abdominal pain and SOB. Pt found to have a SBO and had an NG tube placed. Pt is now S/P ex lap with LOA, BT, 12/04. 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 CXR 12/1 no acute abnormalities. Chart states likely aspiation pneumonia. 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 Suspect aspiration of emesis while in ED. Pt and daughter follow safe swallow precautions and deny recent difficulty. No s/s aspiration with consecutive straw sips thin. Pt is endentulous and no difficulty demonstrated during consumption of graham cracker in applesauce. When therapist returned daughter reported no difficulty with plain graham cracker. Recommend regular texture with  pt/family ordering appropriate foods/textures, thin liquids, pills with thin and continuing to follow safe swallow techniques. No further ST needed.     SLP Visit Diagnosis: Dysphagia, unspecified (R13.10)    Aspiration Risk  Mild aspiration risk    Diet Recommendation Regular;Thin liquid   Liquid Administration via: Cup;Straw Medication Administration: Whole meds with liquid Supervision: Patient able to self feed Compensations: Slow rate;Small sips/bites Postural Changes: Seated upright at 90 degrees    Other  Recommendations Oral Care Recommendations: Oral care BID   Follow up Recommendations None      Frequency and Duration min 2x/week  2 weeks       Prognosis Prognosis for Safe Diet Advancement: Good      Swallow Study   General HPI: Pt is a 77 y/o male admitted secondary to abdominal pain and SOB. Pt found to have a SBO and had an NG tube placed. Pt is now S/P ex lap with LOA, BT, 12/04. 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 CXR 12/1 no acute abnormalities. Chart states likely aspiation pneumonia. History of trach and PEG with oral cancer (2014) Type of Study: Bedside Swallow Evaluation Previous Swallow Assessment: (none) Diet Prior to this Study: Dysphagia 3 (soft);Thin liquids Temperature Spikes Noted: No Respiratory Status: Nasal cannula History of Recent Intubation: No Behavior/Cognition: Pleasant mood;Cooperative;Alert Oral Cavity Assessment: Other (comment)(lingual candidias) Oral Care Completed by SLP: No  Oral Cavity - Dentition: Edentulous Vision: Functional for self-feeding Self-Feeding Abilities: Able to feed self Patient Positioning: Upright in bed Baseline Vocal Quality: Normal Volitional Cough: Strong Volitional Swallow: Able to elicit    Oral/Motor/Sensory Function Overall Oral Motor/Sensory Function: Mild impairment(d/t radiation  )   Ice Chips Ice chips: Not tested   Thin Liquid Thin Liquid: Within functional limits Presentation: Straw    Nectar Thick Nectar Thick Liquid: Not tested   Honey Thick Honey Thick Liquid: Not tested   Puree Puree: Within functional limits   Solid     Solid: Impaired Presentation: Self Fed Oral Phase Impairments: Reduced lingual movement/coordination Oral Phase Functional Implications: Impaired mastication      Mick Sell, Orbie Pyo 09/10/2018,11:48 AM    Orbie Pyo Colvin Caroli.Ed Risk analyst [phone] Office [phone]

## 2018-09-10 NOTE — Progress Notes (Signed)
PHARMACY - ADULT TOTAL PARENTERAL NUTRITION CONSULT NOTE   Pharmacy Consult:  TPN Indication:  SBO  Patient Measurements: Height: 6' (182.9 cm) Weight: 202 lb 9.6 oz (91.9 kg) IBW/kg (Calculated) : 77.6 TPN AdjBW (KG): 96 Body mass index is 27.48 kg/m.  Usual body weight = 91 kg  Assessment:  77 YOM presented on 08/28/18 with abdominal pain and SOB.  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  Also with h/o ruptured appendicitis requiring ex-lap and his hernia is enlarging with chronic cough. CT showed SBO with gallbladder stones.  Patient has been NPO for 5 days with limited PO intake since 08/24/18 per documentation.  MD concerned patient will not improve without surgical intervention; therefore, Pharmacy consulted to initiate TPN.  Patient is at risk for refeeding.  PMH: HTN, ETOH, prostate cancer, mouth cancer, CVA  GI: Enlarging hernia, SBO. Baseline prealbumin low at 11.4, LBM 12/3. Prealbumin good today 21.8. Albumin low at 2.5. S/p OR for ex lap and lysis of adhesions on 12/4. NG output not charted from yesterday. Patient with nausea. No BM or flatus - MD concerned for ileus. Endo: no hx DM: CBGs < 150 Insulin requirements in the past 24 hours: 2 units Lytes: WNL Renal: SCr 1.1 Pulm:  Duonebs Cards: HTN  Hepatobil: LFTs / tbili / TG WNL 133 (12/2) Neuro: h/o CVA ID: r/o aspiration, on CTX (1126 >> 12/2), off azith for PNA - Afebrile, WBC 11.4, BCx negat  TPN Access: PICC placed 09/01/18  TPN start date: 09/02/18 Nutritional Goals (RD rec on 12/3): 2100-2300 kCal  110-125gm protein per day 2.1-2.3 L per day  Current Nutrition:  NPO TPN  Plan:  Wean TPN  to 40 ml/hr per surgery This TPN provides 57 g of protein, 144 g of dextrose, and 31 g of lipids for a total of 1022 kcals meeting 50% of patient needs Electrolytes in TPN: standard concentration exc K at 81mEq/L. Cl:Ac 1:1 Continue MVI, trace elements in TPN  Continue sensitive  SSI and adjust as needed Monitor TPN labs  F/U speech eval today and diet advancement; possibly stop TPN Tuesday  Levester Fresh, PharmD, BCPS, BCCCP Clinical Pharmacist [phone]  Please check AMION for all [place] numbers  09/10/2018 9:01 AM

## 2018-09-10 NOTE — Progress Notes (Signed)
Nutrition Follow-up  INTERVENTION:   - TPN management per pharmacy  - Ensure Enlive po BID, each supplement provides 350 kcal and 20 grams of protein  NUTRITION DIAGNOSIS:   Inadequate oral intake related to inability to eat as evidenced by NPO status.  Ongoing  GOAL:   Patient will meet greater than or equal to 90% of their needs  Progressing  MONITOR:   Diet advancement, Labs, Weight trends, I & O's, Supplement acceptance  REASON FOR ASSESSMENT:   Consult New TPN/TNA  ASSESSMENT:   77 y/o male PMHx CVA w/ L side hemiplegia, prostate cancer and H&N cancer s/p surgery/radiation w/ PEG/trach. Presented to ED w/ diffuse abdominal pain, SOB, chills, cough x 2 days. In ED, CT abdomen showed SBO w/ transition point and CXR showed infiltrates. Admitted for management of SBO and CAP.   11/30 - NGT pulled out, one episode of emesis 12/1 - TPN started at 30 ml/hr, NGT replaced under fluoroscopy 12/2 - TPN increased to 55 ml/hr 12/3 - TPN increased to goal rate of 85 ml/hr 12/4 - NGT pulled out, episode of emesis w/ likely aspiration, NGT replaced (not in stomach), s/p ex-lap and LOA 12/5 - post-op ileus, NGT placed by fluoro 12/9 - NGT out during transfer, TPN weaned to 40 ml/hr  Pt passed SLP evaluation this AM, now on regular diet with thin liquids. Per discussion with pt and pt's daughter at bedside, lunch and dinner have both been ordered by pt's daughter. RN in room; discussed oral nutrition supplements. Pt would like to receive chocolate flavor. RD to order. Pt in good spirits throughout visit, expressed eagerness to eat and drink.  Weight trending down since admission but has fluctuated throughout. Measured weight on admission was 216 lbs, current weight 202 lbs.  Current TPN (to start at 1800): 40 ml/hr to provide 1022 kcal, 57 grams protein, 144 grams dextrose, and 31 grams lipids (meets ~50% of needs)  Per pharmacy note, if pt tolerating diet well, likely to d/c TPN  tomorrow.  Medications reviewed and include: TPN, SSI, Protonix  Labs reviewed: BUN 38 (H) CBG's: 110, 130, 130, 125, 116 x 24 hours  UOP: 1100 ml x 24 hours I/O's: +3.7 L since admit  Diet Order:   Diet Order            Diet regular Room service appropriate? Yes; Fluid consistency: Thin  Diet effective now              EDUCATION NEEDS:   No education needs have been identified at this time  Skin:  Skin Assessment: Skin Integrity Issues: Incisions: abdomen Other: MASD to groin  Last BM:  12/8  Height:   Ht Readings from Last 1 Encounters:  09/06/18 6' (1.829 m)    Weight:   Wt Readings from Last 1 Encounters:  09/10/18 91.9 kg    Ideal Body Weight:  83.64 kg  BMI:  Body mass index is 27.48 kg/m.  Estimated Nutritional Needs:   Kcal:  2200-2400  Protein:  115-130 grams  Fluid:  >/= 2.2 L    Gaynell Face, MS, RD, LDN Inpatient Clinical Dietitian Pager: [phone] Weekend/After Hours: [phone]

## 2018-09-10 NOTE — Progress Notes (Signed)
[facility]/Trauma Progress Note  5 Days Post-Op   Assessment/Plan HTN PMH EtoH abuse  PMH prostate CA PMH mouth CA PMH CVA with left sided weakness ARF -improved,Creatininenormalized Likely aspirationPNA-finished course of ABX  pSBO - CT 11/26 w/ transition zone in lower midline - NG tube placed in fluorox4 -S/P ex lap with LOA, BT, 12/04 - post-op ileus resolved   FEN - NPO pending speech eval, TNA11/30>> ID -no abx currenlty; afebrile, WBC 11.9 VTE - SCD's, Lovenox Follow up: Dr. Grandville Silos  Plan: speech eval pending but pt is okay for a diet from our standpoint   LOS: 13 days    Subjective: CC: abdominal pain  Pain around incision. He states 4 BM's yesterday. No nausea or vomiting. No family at bedside.   Objective: Vital signs in last 24 hours: Temp:  [97.8 F (36.6 C)-97.9 F (36.6 C)] 97.9 F (36.6 C) (12/09 0618) Pulse Rate:  [80-85] 80 (12/09 0618) Resp:  [22] 22 (12/08 1342) BP: (150-166)/(58-72) 166/64 (12/09 0618) SpO2:  [98 %-100 %] 99 % (12/09 0618) Weight:  [91.9 kg] 91.9 kg (12/09 0400) Last BM Date: 09/09/18  Intake/Output from previous day: 12/08 0701 - 12/09 0700 In: 2937.9 [P.O.:90; I.V.:2847.9] Out: 1100 [Urine:1100] Intake/Output this shift: No intake/output data recorded.  PE: Gen: Alert, NAD, pleasant, cooperative HEENT:mandibular deformity from previous surgery Lungs: rate and effort normal Abd: Soft,mild distention,+BS,midline incision with staples intact is well appearing without signs of infection, appropriately tender. No peritonitis Skin: no rashes noted, warm and dry  Anti-infectives: Anti-infectives (From admission, onward)   Start     Dose/Rate Route Frequency Ordered Stop   09/05/18 1100  ceFAZolin (ANCEF) IVPB 2g/100 mL premix     2 g 200 mL/hr over 30 Minutes Intravenous  Once 09/05/18 1054 09/05/18 1111   09/05/18 1054  ceFAZolin (ANCEF) 2-4 GM/100ML-% IVPB    Note to Pharmacy:   [name]   : cabinet override      09/05/18 1054 09/05/18 1111   08/28/18 2230  cefTRIAXone (ROCEPHIN) 2 g in sodium chloride 0.9 % 100 mL IVPB     2 g 200 mL/hr over 30 Minutes Intravenous Every 24 hours 08/28/18 2215 09/03/18 2232   08/28/18 2230  azithromycin (ZITHROMAX) 500 mg in sodium chloride 0.9 % 250 mL IVPB     500 mg 250 mL/hr over 60 Minutes Intravenous Every 24 hours 08/28/18 2215 09/01/18 2229      Lab Results:  Recent Labs    09/08/18 0340 09/10/18 0351  WBC 11.4* 11.9*  HGB 10.8* 11.1*  HCT 34.4* 37.2*  PLT 261 327   BMET Recent Labs    09/09/18 0331 09/10/18 0351  NA 137 140  K 3.9 4.2  CL 104 110  CO2 23 23  GLUCOSE 141* 127*  BUN 39* 38*  CREATININE 1.29* 1.10  CALCIUM 9.4 9.4   PT/INR No results for input(s): LABPROT, INR in the last 72 hours. CMP     Component Value Date/Time   NA 140 09/10/2018 0351   K 4.2 09/10/2018 0351   CL 110 09/10/2018 0351   CO2 23 09/10/2018 0351   GLUCOSE 127 (H) 09/10/2018 0351   BUN 38 (H) 09/10/2018 0351   CREATININE 1.10 09/10/2018 0351   CALCIUM 9.4 09/10/2018 0351   PROT 7.1 09/10/2018 0351   ALBUMIN 2.5 (L) 09/10/2018 0351   AST 19 09/10/2018 0351   ALT 25 09/10/2018 0351   ALKPHOS 77 09/10/2018 0351   BILITOT 0.5 09/10/2018 0351  GFRNONAA >60 09/10/2018 0351   GFRAA >60 09/10/2018 0351   Lipase     Component Value Date/Time   LIPASE 25 08/28/2018 2142    Studies/Results: Dg Abd Portable 1v  Result Date: 09/09/2018 CLINICAL DATA:  Follow-up small bowel obstruction EXAM: PORTABLE ABDOMEN - 1 VIEW COMPARISON:  09/06/2018 FINDINGS: No nasogastric tube identified. Previous midline laparotomy. Moderate amount of gas and stool within the colon. No dilated small bowel loops identified. IMPRESSION: Moderate amount of gas and stool in the colon. Electronically Signed   By: Nelson Chimes M.D.   On: 09/09/2018 08:31      Kalman Drape , [facility] 09/10/2018, 8:23  AM  Pager: [phone] Mon-Wed, Friday 7:00am-4:30pm Thurs 7am-11:30am  Consults: [phone]

## 2018-09-11 LAB — GLUCOSE, CAPILLARY
Glucose-Capillary: 118 mg/dL — ABNORMAL HIGH (ref 70–99)
Glucose-Capillary: 113 mg/dL — ABNORMAL HIGH (ref 70–99)
Glucose-Capillary: 114 mg/dL — ABNORMAL HIGH (ref 70–99)
Glucose-Capillary: 87 mg/dL (ref 70–99)
Glucose-Capillary: 109 mg/dL — ABNORMAL HIGH (ref 70–99)

## 2018-09-11 MED ORDER — HYDROCODONE-ACETAMINOPHEN 5-325 MG PO TABS: 1.0000 | ORAL_TABLET | Freq: Four times a day (QID) | ORAL | 0 refills | Status: DC | PRN

## 2018-09-11 MED ORDER — BISACODYL 10 MG RE SUPP: 10.0000 mg | Freq: Every day | RECTAL | Status: DC | PRN

## 2018-09-11 MED ORDER — VITAMIN D 25 MCG (1000 UNIT) PO TABS
1000.0000 [IU] | ORAL_TABLET | Freq: Every day | ORAL | Status: DC
  Administered 2018-09-11 – 2018-09-13 (×3): 1000 [IU] via ORAL
  Filled 2018-09-11 (×4): qty 1

## 2018-09-11 MED ORDER — VITAMIN B-12 1000 MCG PO TABS
1000.0000 ug | ORAL_TABLET | Freq: Every day | ORAL | Status: DC
  Administered 2018-09-12 – 2018-09-13 (×2): 1000 ug via ORAL
  Filled 2018-09-11 (×3): qty 1

## 2018-09-11 MED ORDER — INSULIN ASPART 100 UNIT/ML ~~LOC~~ SOLN: 0.0000 [IU] | Freq: Three times a day (TID) | SUBCUTANEOUS | Status: DC

## 2018-09-11 MED ORDER — ASPIRIN 81 MG PO CHEW
81.0000 mg | CHEWABLE_TABLET | Freq: Every day | ORAL | Status: DC
  Administered 2018-09-11 – 2018-09-13 (×3): 81 mg via ORAL
  Filled 2018-09-11 (×3): qty 1

## 2018-09-11 MED ORDER — OXYCODONE HCL 5 MG PO TABS
5.0000 mg | ORAL_TABLET | Freq: Four times a day (QID) | ORAL | Status: DC | PRN
  Administered 2018-09-12: 5 mg via ORAL
  Filled 2018-09-11: qty 1

## 2018-09-11 MED ORDER — OXYBUTYNIN CHLORIDE ER 5 MG PO TB24
5.0000 mg | ORAL_TABLET | Freq: Every day | ORAL | Status: DC
  Administered 2018-09-11 – 2018-09-12 (×2): 5 mg via ORAL
  Filled 2018-09-11 (×2): qty 1

## 2018-09-11 MED ORDER — DOCUSATE SODIUM 100 MG PO CAPS
200.0000 mg | ORAL_CAPSULE | Freq: Two times a day (BID) | ORAL | Status: DC
  Administered 2018-09-11 – 2018-09-13 (×5): 200 mg via ORAL
  Filled 2018-09-11 (×5): qty 2

## 2018-09-11 MED ORDER — SENNA 8.6 MG PO TABS: 1.0000 | ORAL_TABLET | Freq: Two times a day (BID) | ORAL | Status: DC | PRN

## 2018-09-11 MED ORDER — SENNOSIDES 8.6 MG PO TABS: 1.0000 | ORAL_TABLET | Freq: Two times a day (BID) | ORAL | Status: DC | PRN

## 2018-09-11 NOTE — Clinical Social Work Note (Signed)
Clinical Social Work Assessment  Patient Details  Name: Luis Lyons MRN: [id number] Date of Birth: [birth year]  Date of referral:  09/11/18               Reason for consult:  Facility Placement                Permission sought to share information with:  Facility Sport and exercise psychologist, Family Supports Permission granted to share information::  Yes, Verbal Permission Granted  Name::     [facility]  Agency::  [facility]  Relationship::  Daughter  Contact Information:  History of CVA with chronic left-sided weakness  Housing/Transportation Living arrangements for the past 2 months:  [place] of Information:  Patient, Adult Children Patient Interpreter Needed:  None Criminal Activity/Legal Involvement Pertinent to Current Situation/Hospitalization:  No - Comment as needed Significant Relationships:  Adult Children Lives with:  Facility Resident Do you feel safe going back to the place where you live?  Yes Need for family participation in patient care:  No (Coment)  Care giving concerns:  CSW received consult for possible SNF placement at time of discharge. CSW spoke with patient and his daughter regarding PT recommendation of SNF placement at time of discharge. Patient's daughter reported that patient resides under long term care at [facility]) and will return at discharge. CSW to continue to follow and assist with discharge planning needs.   Social Worker assessment / plan:  CSW spoke with patient and daughter concerning possibility of rehab at [facility] before returning home.  Employment status:  Retired Forensic scientist:  Medicare PT Recommendations:  [place] / Referral to community resources:  [place]  Patient/Family's Response to care:  Patient reports agreement with discharge plan and will need PTAR.   Patient/Family's Understanding of and Emotional Response to Diagnosis, Current Treatment, and  Prognosis:  Patient/family is realistic regarding therapy needs and expressed being hopeful for return to SNF placement. Patient expressed understanding of CSW role and discharge process as well as medical condition. No questions/concerns about plan or treatment.    Emotional Assessment Appearance:  Appears stated age Attitude/Demeanor/Rapport:  Engaged Affect (typically observed):  Accepting, Appropriate Orientation:  Oriented to Self, Oriented to Place, Oriented to  Time, Oriented to Situation Alcohol / Substance use:  Not Applicable Psych involvement (Current and /or in the community):  No (Comment)  Discharge Needs  Concerns to be addressed:  Care Coordination Readmission within the last 30 days:  No Current discharge risk:  None Barriers to Discharge:  No Barriers Identified   Benard Halsted, LCSW 09/11/2018, 12:14 PM

## 2018-09-11 NOTE — Care Management Note (Addendum)
Case Management Note  Patient Details  Name: Luis Lyons MRN: [id number] Date of Birth: 03-23-1941  Subjective/Objective:      SBO     Hx of CVA with left-sided weakness, prostate cancer, mouth cancer status post surgery / radiation. From long term care at [facility].          - s/p [place], 12/4  [name] Sallee Provencal (Daughter) Ingram Onnen [phone]      Action/Plan: Transition back to SNF today...CSW managing to SNF facility.  Expected Discharge Date:  09/11/18               Expected Discharge Plan:  [place]  In-House Referral:  Clinical Social Work  Discharge planning Services  NA  Post Acute Care Choice:  NA Choice offered to:  NA  DME Arranged:  N/A DME Agency:  NA  HH Arranged:  NA HH Agency:  NA  Status of Service:  Completed, signed off  If discussed at [employer] of Stay Meetings, dates discussed:    Additional Comments:  Sharin Mons, RN 09/11/2018, 11:57 AM

## 2018-09-11 NOTE — Progress Notes (Signed)
NCM learned from [place] pt has appealed d/c. MD made aware. Whitman Hero RN,CM

## 2018-09-11 NOTE — Progress Notes (Signed)
HINN 12 and Detailed Notice of Discharged reviewed with pt and daughter, and signed. Whitman Hero RN,BSN,CM

## 2018-09-11 NOTE — Discharge Instructions (Signed)
Follow with Primary MD Elson Clan, MD in 7 days   Get CBC, CMP  checked  by Primary MD or SNF MD in 5-7 days    Activity: As tolerated with Full fall precautions use walker/cane & assistance as needed  Disposition SNF  Diet: Soft heart healthy diet   Special Instructions: If you have smoked or chewed Tobacco  in the last 2 yrs please stop smoking, stop any regular Alcohol  and or any Recreational drug use.  On your next visit with your primary care physician please Get Medicines reviewed and adjusted.  Please request your Prim.MD to go over all Hospital Tests and Procedure/Radiological results at the follow up, please get all Hospital records sent to your Prim MD by signing hospital release before you go home.  If you experience worsening of your admission symptoms, develop shortness of breath, life threatening emergency, suicidal or homicidal thoughts you must seek medical attention immediately by calling 911 or calling your MD immediately  if symptoms less severe.  You Must read complete instructions/literature along with all the possible adverse reactions/side effects for all the Medicines you take and that have been prescribed to you. Take any new Medicines after you have completely understood and accpet all the possible adverse reactions/side effects.      [facility], [place] [phone]  OPEN ABDOMINAL SURGERY: POST OP INSTRUCTIONS  Always review your discharge instruction sheet given to you by the facility where your surgery was performed.  IF YOU HAVE DISABILITY OR FAMILY LEAVE FORMS, YOU MUST BRING THEM TO THE OFFICE FOR PROCESSING.  PLEASE DO NOT GIVE THEM TO YOUR DOCTOR.  1. A prescription for pain medication may be given to you upon discharge.  Take your pain medication as prescribed, if needed.  If narcotic pain medicine is not needed, then you may take acetaminophen (Tylenol) or ibuprofen (Advil) as needed. 2. Take your usually prescribed  medications unless otherwise directed. 3. If you need a refill on your pain medication, please contact your pharmacy. They will contact our office to request authorization.  Prescriptions will not be filled after 5pm or on week-ends. 4. You should follow a light diet the first few days after arrival home, such as soup and crackers, pudding, etc.unless your doctor has advised otherwise. A high-fiber, low fat diet can be resumed as tolerated.   Be sure to include lots of fluids daily. Most patients will experience some swelling and bruising on the chest and neck area.  Ice packs will help.  Swelling and bruising can take several days to resolve 5. Most patients will experience some swelling and bruising in the area of the incision. Ice pack will help. Swelling and bruising can take several days to resolve..  6. It is common to experience some constipation if taking pain medication after surgery.  Increasing fluid intake and taking a stool softener will usually help or prevent this problem from occurring.  A mild laxative (Milk of Magnesia or Miralax) should be taken according to package directions if there are no bowel movements after 48 hours. 7.  You may have steri-strips (small skin tapes) in place directly over the incision.  These strips should be left on the skin for 7-10 days.  If your surgeon used skin glue on the incision, you may shower in 24 hours.  The glue will flake off over the next 2-3 weeks.  Any sutures or staples will be removed at the office during your follow-up  visit. You may find that a light gauze bandage over your incision may keep your staples from being rubbed or pulled. You may shower and replace the bandage daily. 8. ACTIVITIES:  You may resume regular (light) daily activities beginning the next day--such as daily self-care, walking, climbing stairs--gradually increasing activities as tolerated.  You may have sexual intercourse when it is comfortable.  Refrain from any heavy lifting  or straining until approved by your doctor. a. You may drive when you no longer are taking prescription pain medication, you can comfortably wear a seatbelt, and you can safely maneuver your car and apply brakes b. Return to Work: ___________________________________ 22. You should see your doctor in the office for a follow-up appointment approximately two weeks after your surgery.  Make sure that you call for this appointment within a day or two after you arrive home to insure a convenient appointment time. OTHER INSTRUCTIONS:  _____________________________________________________________ _____________________________________________________________  WHEN TO CALL YOUR DOCTOR: 1. Fever over 101.0 2. Inability to urinate 3. Nausea and/or vomiting 4. Extreme swelling or bruising 5. Continued bleeding from incision. 6. Increased pain, redness, or drainage from the incision. 7. Difficulty swallowing or breathing 8. Muscle cramping or spasms. 9. Numbness or tingling in hands or feet or around lips.  The clinic staff is available to answer your questions during regular business hours.  Please dont hesitate to call and ask to speak to one of the nurses if you have concerns.  For further questions, please visit [email or web address]

## 2018-09-11 NOTE — Progress Notes (Signed)
Physical Therapy Treatment Patient Details Name: Luis Lyons MRN: [id number] DOB: Sep 01, 1941 Today's Date: 09/11/2018    History of Present Illness Pt is a 77 y/o male admitted secondary to abdominal pain and SOB. Pt found to have a SBO and had an NG tube placed. Pt is now S/P ex lap with LOA, BT, 12/04. PMH including but not limited to CVA with L sided weakness, prostate cancer and mouth cancer.     PT Comments    Continuing work on functional mobility and activity tolerance;  Much improved participation today, stood 4 times in [place] with max assist from low surface, and minguard from high seat (of Stedy); lateral scoot transfer bed to recliner also performed with armrest down and +2 max assist; Seems pleased with performance today; Overall progressing better now; Anticipate continuing good progress at post-acute rehabilitation.    Follow Up Recommendations  SNF     Equipment Recommendations  None recommended by PT    Recommendations for Other Services       Precautions / Restrictions Precautions Precautions: Fall Precaution Comments: sore abdomen    Mobility  Bed Mobility   Bed Mobility: Rolling;Sidelying to Sit Rolling: Mod assist Sidelying to sit: +2 for physical assistance;Mod assist       General bed mobility comments: Noting good initiation to roll with cues to reach for bed rail; light mod assist to get to fully sidelying  Transfers Overall transfer level: Needs assistance   Transfers: Sit to/from Stand Sit to Stand: +2 physical assistance;Max assist(from low bed; minguard to stand from higher Stedy seat)         General transfer comment: Max assist to stand from low bed, especially to initiate liftoff; stood from high stedy seat x3 with minguard assist and heavy dependence on UEs to pull up on stedy bar; multimodal cues to fully extend hips and trunk fo rupright standing  Ambulation/Gait                 Stairs             Wheelchair  Mobility    Modified Rankin (Stroke Patients Only)       Balance     Sitting balance-Leahy Scale: Poor(approaching Fair) Sitting balance - Comments: Still with posterior lean (though not overt pushing backwards), likely because of abdominal discomfort     Standing balance-Leahy Scale: Poor Standing balance comment: Significant lift and stability assist                            Cognition Arousal/Alertness: Awake/alert Behavior During Therapy: WFL for tasks assessed/performed Overall Cognitive Status: Within Functional Limits for tasks assessed(for simple mobility tasks)                               Problem Solving: Slow processing        Exercises      General Comments        Pertinent Vitals/Pain Pain Assessment: No/denies pain Faces Pain Scale: Hurts a little bit Pain Descriptors / Indicators: Guarding Pain Intervention(s): Monitored during session    Home Living                      Prior Function            PT Goals (current goals can now be found in the care plan section) Acute Rehab PT Goals Patient  Stated Goal: get better; at end of session, he stated, "that was good" PT Goal Formulation: With patient Time For Goal Achievement: 09/14/18 Potential to Achieve Goals: Fair Progress towards PT goals: Progressing toward goals    Frequency    Min 2X/week      PT Plan Current plan remains appropriate    Co-evaluation              AM-PAC PT "6 Clicks" Mobility   Outcome Measure  Help needed turning from your back to your side while in a flat bed without using bedrails?: A Lot Help needed moving from lying on your back to sitting on the side of a flat bed without using bedrails?: A Lot Help needed moving to and from a bed to a chair (including a wheelchair)?: A Lot Help needed standing up from a chair using your arms (e.g., wheelchair or bedside chair)?: A Lot Help needed to walk in hospital room?:  Total Help needed climbing 3-5 steps with a railing? : Total 6 Click Score: 10    End of Session Equipment Utilized During Treatment: Oxygen(Stedy) Activity Tolerance: Patient tolerated treatment well Patient left: in chair;with call bell/phone within reach;with family/visitor present Nurse Communication: Mobility status(can use stedy for back to bed) PT Visit Diagnosis: Other abnormalities of gait and mobility (R26.89);Pain Pain - part of body: (abdomen)     Time: 2536-6440 PT Time Calculation (min) (ACUTE ONLY): 30 min  Charges:  $Therapeutic Activity: 23-37 mins                     Roney Marion, PT  Acute Rehabilitation Services Pager [phone] Office [place] 09/11/2018, 12:25 PM

## 2018-09-11 NOTE — Progress Notes (Addendum)
Patient will DC to: [facility]) Anticipated DC date: 09/11/18 Family notified: Daughter Freada Bergeron  Transport by: [facility]  Patient daughter concerned about discharge today, but is now accepting of plan now per [facility].   Per MD patient ready for DC to [facility]. RN, patient, patient's family, and facility notified of DC. Discharge Summary and FL2 sent to facility. RN to call report prior to discharge [phone] room 10). DC packet on chart. Ambulance transport requested for patient.   CSW will sign off for now as social work intervention is no longer needed. Please consult [place] again if new needs arise.  Cedric Fishman, LCSW Clinical Social Worker [phone]

## 2018-09-11 NOTE — [place] FL2 (Signed)
[place] MEDICAID FL2 LEVEL OF CARE SCREENING TOOL     IDENTIFICATION  Patient Name: Luis Lyons Birthdate: 11-15-1940 Sex: male Admission Date (Current Location): 08/28/2018  [facility] and [place] Number:  Herbalist and Address:  [facility] [place], [place] [address], [place], [place] [address]      Provider Number: [id number]  Attending Physician Name and Address:  Thurnell Lose, MD  Relative Name and Phone Number:  Devell Parkerson, Daughter, [phone] & Hermann Dottavio, Son, [phone]    Current Level of Care: Hospital Recommended Level of Care: [place] Prior Approval Number:    Date Approved/Denied: 09/23/11 PASRR Number: [id number]  Discharge Plan: SNF    Current Diagnoses: Patient Active Problem List   Diagnosis Date Noted  . CAP (community acquired pneumonia) 08/29/2018  . Essential hypertension 08/29/2018  . Acute bronchitis 08/29/2018  . SBO (small bowel obstruction) ([place]) 08/28/2018    Orientation RESPIRATION BLADDER Height & Weight     Self, Time, Situation, Place  Normal Incontinent Weight: 93.5 kg Height:  6' (182.9 cm)  BEHAVIORAL SYMPTOMS/MOOD NEUROLOGICAL BOWEL NUTRITION STATUS      Continent Diet(Please see DC Summary )  AMBULATORY STATUS COMMUNICATION OF NEEDS Skin   Extensive Assist Verbally Surgical wounds(Closed incision on abdomen)                       Personal Care Assistance Level of Assistance  Bathing, Feeding, Dressing Bathing Assistance: Maximum assistance Feeding assistance: Limited assistance Dressing Assistance: Limited assistance     Functional Limitations Info  Sight, Hearing, Speech Sight Info: Impaired Hearing Info: Impaired Speech Info: Adequate    SPECIAL CARE FACTORS FREQUENCY  PT (By licensed PT), OT (By licensed OT)     PT Frequency: 5x/week OT Frequency: 3x/week            Contractures Contractures Info: Not present    Additional Factors Info   Code Status, Allergies, Insulin Sliding Scale Code Status Info: DNR Allergies Info: NKA   Insulin Sliding Scale Info: Every 6 hours       Current Medications (09/11/2018):  This is the current hospital active medication list Current Facility-Administered Medications  Medication Dose Route Frequency Provider Last Rate Last Dose  . albuterol (PROVENTIL) (2.5 MG/3ML) 0.083% nebulizer solution 2.5 mg  2.5 mg Nebulization Q2H PRN Focht, Jessica L, PA   2.5 mg at 08/29/18 0651  . arformoterol (BROVANA) nebulizer solution 15 mcg  15 mcg Nebulization BID Jonetta Osgood, MD   15 mcg at 09/11/18 0904  . aspirin chewable tablet 81 mg  81 mg Oral Daily Thurnell Lose, MD      . bisacodyl (DULCOLAX) suppository 10 mg  10 mg Rectal Daily PRN Thurnell Lose, MD      . budesonide (PULMICORT) nebulizer solution 0.25 mg  0.25 mg Nebulization BID Jonetta Osgood, MD   0.25 mg at 09/11/18 0905  . cholecalciferol (VITAMIN D3) tablet 1,000 Units  1,000 Units Oral Daily Lala Lund K, MD      . docusate sodium (COLACE) capsule 200 mg  200 mg Oral BID Lala Lund K, MD      . enoxaparin (LOVENOX) injection 40 mg  40 mg Subcutaneous Q24H Focht, Jessica L, PA   40 mg at 09/10/18 1230  . feeding supplement (ENSURE ENLIVE) (ENSURE ENLIVE) liquid 237 mL  237 mL Oral BID BM Thurnell Lose, MD   237 mL at 09/11/18 0959  .  hydrALAZINE (APRESOLINE) injection 10 mg  10 mg Intravenous Q4H PRN Ghimire, Shanker M, MD      . insulin aspart (novoLOG) injection 0-9 Units  0-9 Units Subcutaneous Q6H Focht, Jessica L, PA   1 Units at 09/10/18 1230  . ipratropium-albuterol (DUONEB) 0.5-2.5 (3) MG/3ML nebulizer solution 3 mL  3 mL Nebulization BID Focht, Jessica L, PA   3 mL at 09/11/18 0902  . MEDLINE mouth rinse  15 mL Mouth Rinse BID Jackson Latino L, PA   15 mL at 09/10/18 2153  . menthol-cetylpyridinium (CEPACOL) lozenge 3 mg  1 lozenge Oral PRN Focht, Jessica L, PA      . ondansetron (ZOFRAN) injection 4  mg  4 mg Intravenous Q6H PRN Focht, Jessica L, PA   4 mg at 09/06/18 0540  . oxybutynin (DITROPAN-XL) 24 hr tablet 5 mg  5 mg Oral QHS Lala Lund K, MD      . oxyCODONE (Oxy IR/ROXICODONE) immediate release tablet 5 mg  5 mg Oral Q6H PRN Thurnell Lose, MD      . pantoprazole (PROTONIX) injection 40 mg  40 mg Intravenous Q24H Focht, Jessica L, PA   40 mg at 09/11/18 0959  . polyvinyl alcohol (LIQUIFILM TEARS) 1.4 % ophthalmic solution 1 drop  1 drop Both Eyes BID Focht, Jessica L, PA   1 drop at 09/11/18 0959  . senna (SENOKOT) tablet 8.6 mg  1 tablet Oral BID PRN Thurnell Lose, MD      . Derrill Memo ON 09/12/2018] vitamin B-12 (CYANOCOBALAMIN) tablet 1,000 mcg  1,000 mcg Oral Daily Thurnell Lose, MD         Discharge Medications: Please see discharge summary for a list of discharge medications.  Relevant Imaging Results:  Relevant Lab Results:   Additional Information SSN: [id number]  [place], [place]

## 2018-09-11 NOTE — Discharge Summary (Addendum)
Luis Lyons XBJ:[id number] DOB: 05-03-1941 DOA: 08/28/2018  PCP: Elson Clan, MD  Admit date: 08/28/2018  Discharge date: 09/11/2018  Admitted From: Home   Disposition:  SNF   Recommendations for Outpatient Follow-up:   Follow up with PCP in 1-2 weeks  PCP Please obtain BMP/CBC, 2 view CXR in 1week,  (see Discharge instructions)   PCP Please follow up on the following pending results:    Home Health: None   Equipment/Devices: None  Consultations: CS Discharge Condition: Stable   CODE STATUS: Full   Diet Recommendation: Dysphagia 3    Chief Complaint  Patient presents with  . Abdominal Pain  . Shortness of Breath     Brief history of present illness from the day of admission and additional interim summary    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 Upon further evaluation he was found to have pneumonia and a small bowel obstruction.  Unfortunately patient's obstruction was persistent even with conservative management and underwent laparotomy with lysis of adhesions on 12/4.                                                                  Hospital Course   SBO: Underwent exploratory laparotomy and lysis of adhesions on 12/4.  Clear improving, on TNA via right arm PICC line.  Thankfully he is now having bowel movements he had 2 BMs in the last 24 hours on 09/10/2018, NG tube was pulled already on 09/09/2018, abdominal exam is benign, discussed with general surgery on 09/10/2018 and 09/11/2018, passing flatus has had 2-3 bowel movements so far, tolerated diet for over 24 hours without any discomfort,  placed on stool softeners along with PRN laxatives, stable for discharge to SNF.  No active surgical or medical issues, cleared by general surgery for discharge as well.  Leukocytosis: Likely reactive from recent surgery and ongoing ileus-has almost resolved.  Recent chest x-ray on 12/21 showed no pneumonia.  Remains afebrile.  And to repeat CBC in a week at SNF.   Pneumonia: Has chronic dysphagia due to oropharyngeal surgery in the past, pneumonia likely was due to aspiration.  Has completed antibiotic treatment.  Continue to monitor clinically.  Flutter valve and I-S for pulmonary toiletry to be continued.  Encouraged to sit up in chair as much as possible.    AKI: Due to dehydration completely resolved.  COPD: Has significant upper airway sounds are transmitted-wheezing occasionally-continue bronchodilators, Lasix x1 today.  Continue flutter valve/spirometry/mobilization.  History of CVA with chronic left-sided weakness:  Resumed home dose aspirin for secondary prevention.  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  History of prostate cancer-status post prostatectomy: Followed by urology in the outpatient setting, home dose Ditropan has been resumed.    Discharge diagnosis     Principal Problem:   SBO (small bowel obstruction) (HCC) Active Problems:   CAP (community acquired pneumonia)   Essential hypertension   Acute bronchitis    Discharge instructions    Discharge Instructions    Discharge instructions   Complete by:  As directed    Follow with Primary MD Elson Clan, MD in 7 days   Get CBC, CMP  checked  by Primary MD or SNF MD in 5-7 days    Activity: As tolerated with Full fall precautions use walker/cane & assistance as needed  Disposition SNF  Diet: Soft heart healthy  diet   Special Instructions: If you have smoked or chewed Tobacco  in the last 2 yrs please stop smoking, stop any regular Alcohol  and or any Recreational drug use.  On your next visit with your primary care physician please Get Medicines reviewed and adjusted.  Please request your Prim.MD to go over all Hospital Tests and Procedure/Radiological results at the follow up, please get all Hospital records sent to your Prim MD by signing hospital release before you go home.  If you experience worsening of your admission symptoms, develop shortness of breath, life threatening emergency, suicidal or homicidal thoughts you must seek medical attention immediately by calling 911 or calling your MD immediately  if symptoms less severe.  You Must read complete instructions/literature along with all the possible adverse reactions/side effects for all the Medicines you take and that have been prescribed to you. Take any new Medicines after you have completely understood and accpet all the possible adverse reactions/side effects.   Increase activity slowly   Complete by:  As directed       Discharge Medications   Allergies as of 09/11/2018   No Known Allergies     Medication List    STOP taking these medications   loperamide 2 MG tablet Commonly known as:  IMODIUM A-D     TAKE these medications   acetaminophen 500 MG tablet Commonly known as:  TYLENOL Take 500 mg by mouth every 4 (four) hours as needed for mild pain.   Acidophilus Caps capsule Take 2 capsules by mouth 2 (two) times daily.   albuterol (2.5 MG/3ML) 0.083% nebulizer solution Commonly known as:  PROVENTIL Take 2.5 mg by nebulization 2 (two) times daily as needed for wheezing or shortness of breath.   aspirin 81 MG chewable tablet Chew 81 mg by mouth daily.   chlorhexidine 0.12 % solution Commonly known as:  PERIDEX 15 mLs by Mouth Rinse route daily.   cholecalciferol 25 MCG (1000 UT) tablet Commonly known as:  VITAMIN  D3 Take 1,000 Units by mouth daily.   DELSYM 30 MG/5ML liquid Generic drug:  dextromethorphan Take 60 mg by mouth 2 (two) times daily as needed for cough.   DENTAGEL 1.1 % Gel dental gel Generic drug:  sodium fluoride Place 1 application onto teeth 2 (two) times daily.   fluticasone 50 MCG/ACT nasal spray Commonly known as:  FLONASE Place 1 spray into both nostrils daily.   HYDROcodone-acetaminophen 5-325 MG tablet Commonly known as:  NORCO/VICODIN Take 1 tablet by mouth every 6 (six) hours as needed for severe pain. What changed:  when to take this  reasons to take this   ibuprofen 200 MG tablet Commonly known as:  ADVIL,MOTRIN Take 400 mg by mouth every 6 (six) hours as needed for moderate pain.   ipratropium-albuterol 0.5-2.5 (3) MG/3ML Soln Commonly known as:  DUONEB Take 3 mLs by nebulization 4 (four) times daily.   magnesium hydroxide 400 MG/5ML suspension Commonly known as:  MILK OF MAGNESIA Take 30 mLs by mouth daily as needed for mild constipation.   oxybutynin 5 MG 24 hr tablet Commonly known as:  DITROPAN-XL Take 5 mg by mouth at bedtime.   phenazopyridine 200 MG tablet Commonly known as:  PYRIDIUM Take 200 mg by mouth 3 (three) times daily as needed for pain.   polyethylene glycol packet Commonly known as:  MIRALAX / GLYCOLAX Take 17 g by mouth daily.   polyvinyl alcohol 1.4 % ophthalmic solution Commonly known as:  LIQUIFILM TEARS Place 1 drop into both eyes 2 (two) times daily.   senna 8.6 MG tablet Commonly known as:  SENOKOT Take 1 tablet by mouth 2 (two) times daily as needed for constipation.   vitamin B-12 1000 MCG tablet Commonly known as:  CYANOCOBALAMIN Take 1,000 mcg by mouth daily.   zinc oxide 20 % ointment Apply 1 application topically 2 (two) times daily as needed for irritation.       Follow-up Information    Elson Clan, MD. Schedule an appointment as soon as possible for a visit in 1 week(s).   Specialty:  Family  Medicine Contact information: [address] [place] [address] [place] [address] [phone]        [facility], [place]. Go on 09/27/2018.   Specialty:  General Surgery Why:  at 1:30pm for follow up. Please arrive 20 minutes prior to complete paperwork. Bring photo ID and insurace card. Please do not leave pt unattended. A family member or a staff member must remain with pt or appointment will need to be rescheuled.  Contact information: [address] [place] [phone]          Major procedures and Radiology Reports - PLEASE review detailed and final reports thoroughly  -         Dg Chest 1 View  Result Date: 09/02/2018 CLINICAL DATA:  Wheezing. EXAM: CHEST  1 VIEW COMPARISON:  09/02/2018 FINDINGS: Right arm PICC line tip is in the cavoatrial junction. There is a feeding tube in place. Heart size normal. No pleural effusion or edema. No airspace opacities identified. IMPRESSION: No acute cardiopulmonary abnormalities. Electronically Signed   By: Kerby Moors M.D.   On: 09/02/2018 15:46   Dg Abd 1 View  Result Date: 09/06/2018 CLINICAL DATA:  Nasogastric tube placement. EXAM: ABDOMEN - 1 VIEW COMPARISON:  09/05/2018. FINDINGS: NG tube noted with tip stomach. No bowel distention noted. Degenerative change thoracolumbar spine IMPRESSION: NG tube noted with tip in stomach. Electronically Signed   By: Marcello Moores  Register   On: 09/06/2018 12:40   X-ray Abdomen Ap  Result Date: 09/05/2018 CLINICAL DATA:  Encounter for NG tube placement. EXAM: ABDOMEN - 1 VIEW COMPARISON:  None. FINDINGS: The radiograph is suboptimal, underpenetrated and includes overlapping shadows of the patient's RIGHT hand. A tube projects over the lower thoracic region, with its tip likely in the distal esophagus, but not in the stomach. Nonobstructive gas pattern. Surgical clips overlie the RIGHT abdomen. IMPRESSION: Suboptimal radiograph. Nasogastric tube is not in the  stomach, with tip appearing to project over  distal esophagus. Recommend repositioning the tube, with repeat imaging. Electronically Signed   By: Staci Righter M.D.   On: 09/05/2018 15:12   Dg Abd 1 View  Result Date: 09/03/2018 CLINICAL DATA:  Small-bowel obstruction.  NG tube. EXAM: ABDOMEN - 1 VIEW COMPARISON:  Abdominal radiographic series-09/02/2018; 09/01/2018; 08/29/2018; CT abdomen pelvis-11/26/fluoroscopic guided nasogastric tube placement-09/02/2018 FINDINGS: There is been apparent exchange of existing weighted tip feeding tube with new enteric tube tip overlying the expected location of the descending portion of the duodenum. Re demonstrated moderate gaseous distension of several rather patulous loops of small bowel, unchanged to slightly improved compared to the 09/02/2018 examination with index loop of small bowel in the right mid/lower abdomen measuring 5.6 cm in diameter. Nondiagnostic evaluation for pneumoperitoneum secondary to supine positioning exclusion of the lower thorax. Stigmata of DISH throughout the thoracic spine. Post ORIF of the left femur and femoral neck. IMPRESSION: 1. No change to slight improvement in findings again concerning for small bowel obstruction. 2. Apparent change of enteric tube with new enteric tube tip terminating over the expected location of the descending portion duodenum. Electronically Signed   By: Sandi Mariscal M.D.   On: 09/03/2018 07:46   Dg Abd 1 View  Result Date: 09/01/2018 CLINICAL DATA:  SBO, upright image requested by MD. EXAM: ABDOMEN - 1 VIEW COMPARISON:  08/30/2018 FINDINGS: Nasogastric tube has been advanced into the proximal duodenum. The stomach appears decompressed. There are multiple gas distended small bowel loops identified in the upper abdomen. The lower abdomen is excluded. Visualized portions of colon are nondilated. There is no convincing free air. Visualized lung bases unremarkable. IMPRESSION: 1. Nasogastric tube advancement into the  proximal duodenum, with persistent small bowel distention. Electronically Signed   By: Lucrezia Europe M.D.   On: 09/01/2018 07:57   Dg Abd 1 View  Result Date: 08/30/2018 CLINICAL DATA:  Small bowel obstruction EXAM: ABDOMEN - 1 VIEW COMPARISON:  August 29, 2018 FINDINGS: The NG tube terminates in the proximal stomach. Small bowel dilatation is less prominent on this study but remains. No other acute abnormalities. IMPRESSION: The small bowel dilatation is less prominent but remains consistent with persistent small-bowel obstruction. The enteric tube appears to terminate in the proximal stomach. Electronically Signed   By: Dorise Bullion III M.D   On: 08/30/2018 13:22   Dg Abd 1 View  Result Date: 08/29/2018 CLINICAL DATA:  Replacement of NG tube. EXAM: ABDOMEN - 1 VIEW COMPARISON:  One-view abdomen 08/29/2018 at 8:46 a.m. FINDINGS: Side port of the NG tube is beyond the GE junction. Mildly dilated loops of small bowel are similar the prior exam. No free air is present. IMPRESSION: 1. Side port of the NG tube is beyond the GE junction. 2. Similar appearance of multiple dilated loops of small bowel without evidence of free air. Electronically Signed   By: San Morelle M.D.   On: 08/29/2018 13:08   Dg Abd 1 View  Result Date: 08/29/2018 CLINICAL DATA:  Check nasogastric catheter placement EXAM: ABDOMEN - 1 VIEW COMPARISON:  None. FINDINGS: 30 seconds of fluoroscopy was utilized for placement. The nasogastric catheter is shown coiled within the gastric lumen. IMPRESSION: Nasogastric catheter placed in the stomach. Electronically Signed   By: Inez Catalina M.D.   On: 08/29/2018 09:08   Ct Abdomen Pelvis W Contrast  Result Date: 08/28/2018 CLINICAL DATA:  Abdominal pain with shortness of breath EXAM: CT ABDOMEN AND PELVIS WITH CONTRAST TECHNIQUE: Multidetector CT imaging of the abdomen and pelvis  was performed using the standard protocol following bolus administration of intravenous contrast.  CONTRAST:  164mL OMNIPAQUE IOHEXOL 300 MG/ML  SOLN COMPARISON:  None. FINDINGS: LOWER CHEST: There is no basilar pleural or apical pericardial effusion. HEPATOBILIARY: The hepatic contours and density are normal. There is no intra- or extrahepatic biliary dilatation. There is cholelithiasis without acute inflammation. PANCREAS: The pancreatic parenchymal contours are normal and there is no ductal dilatation. There is no peripancreatic fluid collection. SPLEEN: Normal. ADRENALS/URINARY TRACT: --Adrenal glands: Normal. --Right kidney/ureter: Mild atrophy. No hydronephrosis. --Left kidney/ureter: Vascular calcifications and interpolar nonobstructive 3 mm nephrolithiasis. No hydronephrosis. --Urinary bladder: Mild bladder wall thickening, possibly due to underdistention. STOMACH/BOWEL: --Stomach/Duodenum: There is no hiatal hernia or other gastric abnormality. The duodenal course and caliber are normal. --Small bowel: There are multiple loops of dilated small bowel in the central abdomen, predominantly anteriorly. One of these loops is directly beneath the skin surface, extending through a diastatic defect of the anterior abdominal muscles. Suspected transition point is in the anterior low midline abdomen (coronal image 24 and sagittal image 70). --Colon: No focal abnormality. --Appendix: Not visualized. No right lower quadrant inflammation or free fluid. VASCULAR/LYMPHATIC: Atherosclerotic calcification is present within the non-aneurysmal abdominal aorta, without hemodynamically significant stenosis. The portal vein, splenic vein, superior mesenteric vein and IVC are patent. No abdominal or pelvic lymphadenopathy. REPRODUCTIVE: Normal prostate size with symmetric seminal vesicles. MUSCULOSKELETAL. Compression deformity of L2 is probably chronic. There is grade 1 L4-5 anterolisthesis secondary to facet arthrosis. OTHER: None. IMPRESSION: 1. Small bowel obstruction with suspected transition point in the low anterior  midline abdomen. 2. Cholelithiasis without acute inflammation. 3. Nonobstructing left nephrolithiasis. Aortic Atherosclerosis (ICD10-I70.0). Electronically Signed   By: Ulyses Jarred M.D.   On: 08/28/2018 23:21   Dg Chest Port 1 View  Result Date: 08/28/2018 CLINICAL DATA:  77 y/o  M; abdominal pain and shortness of breath. EXAM: PORTABLE CHEST 1 VIEW COMPARISON:  None. FINDINGS: Normal cardiac silhouette. Aortic atherosclerosis with calcification. Perihilar and basilar reticular opacities. No consolidation. No pleural effusion or pneumothorax. No acute osseous abnormality is evident. IMPRESSION: Basilar reticular opacities may represent mild interstitial edema or atypical pneumonia. No consolidation. Electronically Signed   By: Kristine Garbe M.D.   On: 08/28/2018 21:55   Dg Abd Portable 1v  Result Date: 09/09/2018 CLINICAL DATA:  Follow-up small bowel obstruction EXAM: PORTABLE ABDOMEN - 1 VIEW COMPARISON:  09/06/2018 FINDINGS: No nasogastric tube identified. Previous midline laparotomy. Moderate amount of gas and stool within the colon. No dilated small bowel loops identified. IMPRESSION: Moderate amount of gas and stool in the colon. Electronically Signed   By: Nelson Chimes M.D.   On: 09/09/2018 08:31   Dg Abd Portable 1v  Result Date: 09/05/2018 CLINICAL DATA:  Ongoing abdomen pain and distention. EXAM: PORTABLE ABDOMEN - 1 VIEW COMPARISON:  09/04/2018. FINDINGS: Nasogastric tube has been removed. There has been persistence of and perhaps mild progression of dilated small bowel loops along with inspissated contrast throughout a distended colon. IMPRESSION: Slight progression of dilated small bowel loops along with inspissated contrast throughout a distended colon. Findings remain consistent with a partial small bowel obstruction. Electronically Signed   By: Staci Righter M.D.   On: 09/05/2018 10:43   Dg Abd Portable 1v  Result Date: 09/04/2018 CLINICAL DATA:  Follow up small bowel  obstruction EXAM: PORTABLE ABDOMEN - 1 VIEW COMPARISON:  09/03/2018 FINDINGS: Nasogastric catheter is noted in the second portion of the duodenum stable from the prior exam. Scattered large  and small bowel gas is noted. There is been a slight decrease in the degree of small bowel dilatation when compared with the previous exam. No free air is noted. Postsurgical changes in the left hip are again seen. IMPRESSION: Improvement in the degree of small bowel dilatation when compared with the previous day. Continued follow-up is recommended. Electronically Signed   By: Inez Catalina M.D.   On: 09/04/2018 08:46   Dg Abd Portable 1v  Result Date: 08/29/2018 CLINICAL DATA:  Small bowel obstruction, 8 hour delayed film after Gastrografin EXAM: PORTABLE ABDOMEN - 1 VIEW COMPARISON:  Abdominal radiograph from earlier today FINDINGS: Enteric tube terminates in the proximal stomach. Moderately dilated small bowel loops throughout the abdomen are not appreciably changed. Oral contrast transits to the mid small bowel. No definite oral contrast in the colon. Moderate colonic stool. No evidence of pneumatosis or pneumoperitoneum. Clear lung bases. IMPRESSION: 1. Enteric tube terminates in the stomach. 2. Stable moderately dilated small bowel loops compatible with small-bowel obstruction. Oral contrast transits to the mid small bowel, with no definite oral contrast in the colon. Electronically Signed   By: Ilona Sorrel M.D.   On: 08/29/2018 21:38   Dg Abd Portable 1v-small Bowel Obstruction Protocol-initial, 8 Hr Delay  Result Date: 08/29/2018 CLINICAL DATA:  Follow up small bowel obstruction EXAM: PORTABLE ABDOMEN - 1 VIEW COMPARISON:  08/28/2018 FINDINGS: Nasogastric catheter is now noted within the stomach. Fecal material is noted throughout the colon. Contrast is seen within the bladder from recent CT examination. Multiple dilated small bowel loops are again identified in the mid abdomen stable from the recent exam. No  acute bony abnormality is noted. Prior L2 compression deformity is noted. IMPRESSION: Stable small bowel dilatation following nasogastric catheter placement Electronically Signed   By: Inez Catalina M.D.   On: 08/29/2018 09:09   Dg Abd Portable 2v  Result Date: 09/02/2018 CLINICAL DATA:  Initial evaluation for possible foreign body, missing piece of NG tube. EXAM: PORTABLE ABDOMEN - 2 VIEW COMPARISON:  Prior radiograph from 09/01/2018. FINDINGS: Previously seen enteric tube has been removed. No visible tube fragment or other radiopaque foreign body identified. Persistent small bowel dilatation noted within the upper abdomen, suggestive of ongoing small bowel obstruction. This appears improved from previous. Partially visualized lung bases are grossly clear. IMPRESSION: 1. No visible tube fragment or other radiopaque foreign body identified. 2. Persistent but improved small bowel obstruction. Electronically Signed   By: Jeannine Boga M.D.   On: 09/02/2018 05:52   Dg Addison Bailey G Tube Plc W/fl W/rad  Result Date: 09/03/2018 CLINICAL DATA:  Were acquires nasogastric tube placement. EXAM: NASO G TUBE PLACEMENT WITH FL AND WITH RAD CONTRAST:  Water-soluble contrast FLUOROSCOPY TIME:  Fluoroscopy Time:  1 minutes 12 seconds Radiation Exposure Index (if provided by the fluoroscopic device): Number of Acquired Spot Images: 2 COMPARISON:  Earlier today FINDINGS: Patient's indwelling weighted feeding tube was removed. A nasogastric tube was then placed through the patient's right nares and under direct fluoroscopic guidance was manipulated into the stomach. The tube was in extended into the duodenum. A small volume of water-soluble contrast material was injected into the nasogastric tube in the tip terminates at the distal portion of the descending duodenum. IMPRESSION: Removal of indwelling weighted feeding tube with subsequent fluoroscopic guided placement of nasogastric tube into the proximal duodenum. Contrast  injected into the nasogastric tube confirms tip location at the distal portion of the descending duodenum. Electronically Signed   By: Kerby Moors  M.D.   On: 09/03/2018 12:51   Dg [place] Dys Tube Plc W/fl W/rad  Result Date: 09/02/2018 CLINICAL DATA:  NG tube placement. EXAM: NASO G TUBE PLACEMENT WITH FL AND WITH RAD CONTRAST:  Water-soluble contrast FLUOROSCOPY TIME:  Fluoroscopy Time:  5 minutes 48 seconds Radiation Exposure Index (if provided by the fluoroscopic device): Number of Acquired Spot Images: 2 COMPARISON:  09/02/2018 FINDINGS: Under direct fluoroscopic guidance a weighted nasogastric tube was placed into the right nares. The tube was then guided into the esophagus, past the GE junction and into the gastric lumen. The tube was then manipulated into the third portion of the duodenum. A small volume of water-soluble contrast material was injected into the tube confirming placement of the tube at the level of the duodenal jejunal junction. IMPRESSION: 1. Successful fluoroscopic guided weighted nasogastric tube placement. The tip is at the level of the duodenal jejunal junction and is ready for use. Electronically Signed   By: Kerby Moors M.D.   On: 09/02/2018 11:43   [place] Ekg Site Rite  Result Date: 09/01/2018 If Site Rite image not attached, placement could not be confirmed due to current cardiac rhythm.   Micro Results     Recent Results (from the past 240 hour(s))  Surgical pcr screen     Status: None   Collection Time: 09/05/18  9:54 AM  Result Value Ref Range Status   MRSA, PCR NEGATIVE NEGATIVE Final   Staphylococcus aureus NEGATIVE NEGATIVE Final    Comment: (NOTE) The Xpert SA Assay (FDA approved for NASAL specimens in patients 13 years of age and older), is one component of a comprehensive surveillance program. It is not intended to diagnose infection nor to guide or monitor treatment. Performed at [facility], [place] [address], [place], [place] [address]      Today   Subjective    Luis Lyons today has no headache,no chest abdominal pain,no new weakness tingling or numbness, feels much better wants to go home today.     Objective   Blood pressure (!) 159/66, pulse 82, temperature 98.5 F (36.9 C), temperature source Oral, resp. rate 20, height 6' (1.829 m), weight 93.5 kg, SpO2 99 %.   Intake/Output Summary (Last 24 hours) at 09/11/2018 1205 Last data filed at 09/11/2018 0400 Gross per 24 hour  Intake 428.79 ml  Output 775 ml  Net -346.21 ml    Exam  Awake Alert, Oriented X 3, No new F.N deficits, Normal affect [place].AT,PERRAL Supple Neck,No JVD, No cervical lymphadenopathy appriciated.  Symmetrical Chest wall movement, Good air movement bilaterally, CTAB RRR,No Gallops, Rubs or new Murmurs, No Parasternal Heave Positive bowel sounds, midline laparotomy scar under bandage, NG tube in place, right arm PICC. No Cyanosis, Clubbing or edema, No new Rash or bruise   Data Review   CBC w Diff:  Lab Results  Component Value Date   WBC 11.9 (H) 09/10/2018   HGB 11.1 (L) 09/10/2018   HCT 37.2 (L) 09/10/2018   PLT 327 09/10/2018   LYMPHOPCT 12 09/10/2018   MONOPCT 9 09/10/2018   EOSPCT 7 09/10/2018   BASOPCT 1 09/10/2018    CMP:  Lab Results  Component Value Date   NA 140 09/10/2018   K 4.2 09/10/2018   CL 110 09/10/2018   CO2 23 09/10/2018   BUN 38 (H) 09/10/2018   CREATININE 1.10 09/10/2018   PROT 7.1 09/10/2018   ALBUMIN 2.5 (L) 09/10/2018   BILITOT 0.5 09/10/2018   ALKPHOS 77 09/10/2018  AST 19 09/10/2018   ALT 25 09/10/2018  .   Total Time in preparing paper work, data evaluation and todays exam - 80 minutes  Lala Lund M.D on 09/11/2018 at 12:05 PM  [facility]   Office  [phone]

## 2018-09-11 NOTE — Progress Notes (Signed)
[facility]) aware that patient has appealed discharge and will not be discharging there today.   Percell Locus Tzipora Mcinroy LCSW [phone]

## 2018-09-11 NOTE — Progress Notes (Signed)
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 Grandville Silos  Plan: okay for discharge from our standpoint    LOS: 14 days    Subjective: CC: abdominal soreness  Pain improved. Having flatus and tolerating diet. Not many teeth so difficult to chew regular diet. No family at bedside. No new complaints. No issues overnight.   Objective: Vital signs in last 24 hours: Temp:  [98.3 F (36.8 C)-98.7 F (37.1 C)] 98.5 F (36.9 C) (12/10 0439) Pulse Rate:  [82-85] 82 (12/10 0439) Resp:  [18-20] 20 (12/10 0439) BP: (143-160)/(62-67) 159/66 (12/10 0439) SpO2:  [95 %-98 %] 97 % (12/10 0439) Weight:  [93.5 kg] 93.5 kg (12/10 0415) Last BM Date: 09/09/18  Intake/Output from previous day: 12/09 0701 - 12/10 0700 In: 428.8 [I.V.:428.8] Out: 1375 [Urine:1375] Intake/Output this shift: No intake/output data recorded.  PE: Gen: Alert, NAD, pleasant, cooperative HEENT:mandibular deformity from previous surgery Lungs: rate and effort normal Abd: Soft,mild distention,+BS,midline incision with staples intact is well appearing without signs of infection, appropriately tender. No peritonitis Skin: no rashes noted, warm and dry  Anti-infectives: Anti-infectives (From admission, onward)   Start     Dose/Rate Route Frequency Ordered Stop   09/05/18 1100  ceFAZolin (ANCEF) IVPB 2g/100 mL premix     2 g 200 mL/hr over 30 Minutes Intravenous  Once 09/05/18 1054 09/05/18 1111   09/05/18 1054  ceFAZolin (ANCEF) 2-4  GM/100ML-% IVPB    Note to Pharmacy:  [name]   : cabinet override      09/05/18 1054 09/05/18 1111   08/28/18 2230  cefTRIAXone (ROCEPHIN) 2 g in sodium chloride 0.9 % 100 mL IVPB     2 g 200 mL/hr over 30 Minutes Intravenous Every 24 hours 08/28/18 2215 09/03/18 2232   08/28/18 2230  azithromycin (ZITHROMAX) 500 mg in sodium chloride 0.9 % 250 mL IVPB     500 mg 250 mL/hr over 60 Minutes Intravenous Every 24 hours 08/28/18 2215 09/01/18 2229      Lab Results:  Recent Labs    09/10/18 0351  WBC 11.9*  HGB 11.1*  HCT 37.2*  PLT 327   BMET Recent Labs    09/09/18 0331 09/10/18 0351  NA 137 140  K 3.9 4.2  CL 104 110  CO2 23 23  GLUCOSE 141* 127*  BUN 39* 38*  CREATININE 1.29* 1.10  CALCIUM 9.4 9.4   PT/INR No results for input(s): LABPROT, INR in the last 72 hours. CMP     Component Value Date/Time   NA 140 09/10/2018 0351   K 4.2 09/10/2018 0351   CL 110 09/10/2018 0351   CO2 23 09/10/2018 0351   GLUCOSE 127 (H) 09/10/2018 0351   BUN 38 (H) 09/10/2018 0351   CREATININE 1.10 09/10/2018 0351   CALCIUM 9.4 09/10/2018 0351   PROT 7.1 09/10/2018 0351   ALBUMIN 2.5 (L) 09/10/2018 0351   AST 19 09/10/2018 0351   ALT 25 09/10/2018 0351   ALKPHOS 77 09/10/2018 0351   BILITOT 0.5 09/10/2018 0351  GFRNONAA >60 09/10/2018 0351   GFRAA >60 09/10/2018 0351   Lipase     Component Value Date/Time   LIPASE 25 08/28/2018 2142    Studies/Results: No results found.    Kalman Drape , [facility] 09/11/2018, 8:22 AM  Pager: [phone] Mon-Wed, Friday 7:00am-4:30pm Thurs 7am-11:30am  Consults: [phone]

## 2018-09-12 LAB — GLUCOSE, CAPILLARY
GLUCOSE-CAPILLARY: 126 mg/dL — AB (ref 70–99)
Glucose-Capillary: 104 mg/dL — ABNORMAL HIGH (ref 70–99)
Glucose-Capillary: 147 mg/dL — ABNORMAL HIGH (ref 70–99)
Glucose-Capillary: 95 mg/dL (ref 70–99)

## 2018-09-12 NOTE — Progress Notes (Addendum)
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 Grandville Silos  Plan: okay for discharge from our standpoint. We will sign off. Please page [place] if needed.   LOS: 15 days    Subjective: CC: SBO s/p ex lap  Pt denies abdominal pain, nausea or vomiting. He is having flatus. No issues overnight. No family at bedside.   Objective: Vital signs in last 24 hours: Temp:  [97.8 F (36.6 C)-98.2 F (36.8 C)] 98 F (36.7 C) (12/11 0618) Pulse Rate:  [76-81] 76 (12/11 0618) Resp:  [19-21] 21 (12/11 0618) BP: (121-153)/(59-61) 151/59 (12/11 0618) SpO2:  [94 %-99 %] 94 % (12/11 0618) Last BM Date: 09/09/18  Intake/Output from previous day: 12/10 0701 - 12/11 0700 In: 180 [P.O.:180] Out: 1000 [Urine:1000] Intake/Output this shift: No intake/output data recorded.  PE: Gen: Alert, NAD, pleasant, cooperative HEENT:mandibular deformity from previous surgery Lungs: rate and effort normal Abd: Soft,no distention,+BS,midline incisionwith staples intact is well appearing without signs of infection, no TTP. No peritonitis Skin: no rashes noted, warm and dry   Anti-infectives: Anti-infectives (From admission, onward)   Start     Dose/Rate Route Frequency Ordered Stop   09/05/18 1100  ceFAZolin (ANCEF) IVPB 2g/100 mL premix     2 g 200 mL/hr over 30 Minutes Intravenous  Once 09/05/18 1054 09/05/18 1111   09/05/18 1054  ceFAZolin (ANCEF) 2-4 GM/100ML-% IVPB    Note to Pharmacy:  [name]   : cabinet override       09/05/18 1054 09/05/18 1111   08/28/18 2230  cefTRIAXone (ROCEPHIN) 2 g in sodium chloride 0.9 % 100 mL IVPB     2 g 200 mL/hr over 30 Minutes Intravenous Every 24 hours 08/28/18 2215 09/03/18 2232   08/28/18 2230  azithromycin (ZITHROMAX) 500 mg in sodium chloride 0.9 % 250 mL IVPB     500 mg 250 mL/hr over 60 Minutes Intravenous Every 24 hours 08/28/18 2215 09/01/18 2229      Lab Results:  Recent Labs    09/10/18 0351  WBC 11.9*  HGB 11.1*  HCT 37.2*  PLT 327   BMET Recent Labs    09/10/18 0351  NA 140  K 4.2  CL 110  CO2 23  GLUCOSE 127*  BUN 38*  CREATININE 1.10  CALCIUM 9.4   PT/INR No results for input(s): LABPROT, INR in the last 72 hours. CMP     Component Value Date/Time   NA 140 09/10/2018 0351   K 4.2 09/10/2018 0351   CL 110 09/10/2018 0351   CO2 23 09/10/2018 0351   GLUCOSE 127 (H) 09/10/2018 0351   BUN 38 (H) 09/10/2018 0351   CREATININE 1.10 09/10/2018 0351   CALCIUM 9.4 09/10/2018 0351   PROT 7.1 09/10/2018 0351   ALBUMIN 2.5 (L) 09/10/2018 0351   AST 19 09/10/2018 0351   ALT 25 09/10/2018 0351   ALKPHOS 77 09/10/2018 0351   BILITOT 0.5 09/10/2018 0351   GFRNONAA >60 09/10/2018 0351   GFRAA >60 09/10/2018 0351   Lipase     Component  Value Date/Time   LIPASE 25 08/28/2018 2142    Studies/Results: No results found.    Kalman Drape , [facility] 09/12/2018, 8:27 AM  Pager: [phone] Mon-Wed, Friday 7:00am-4:30pm Thurs 7am-11:30am  Consults: [phone]

## 2018-09-12 NOTE — Progress Notes (Signed)
CSW confirmed that patient's daughter is still waiting on the appeal decision.   Luis Locus Walid Haig LCSW [phone]

## 2018-09-12 NOTE — Progress Notes (Signed)
[facility]                                                                                                                                                                         Patient Demographics  Luis Lyons, is a 77 y.o. male  RKY:[id number]  [id number]  DOB - [birth year]  Admit date - 08/28/2018  Admitting Physician Luis Patience, MD  Outpatient Primary MD for the patient is Luis Clan, MD  LOS - 15   Chief Complaint  Patient presents with  . Abdominal Pain  . Shortness of Breath        Assessment & Plan    Patient seen briefly today due for discharge soon per Discharge done yesterday by me, no further issues, Vital signs stable, patient feels fine.  He is tolerating diet continues to have regular bowel movements, had another large bowel movement yesterday, feels fine no nausea in fact he is hungry.  He remains discharged.    Medications  Scheduled Meds: . arformoterol  15 mcg Nebulization BID  . aspirin  81 mg Oral Daily  . budesonide (PULMICORT) nebulizer solution  0.25 mg Nebulization BID  . cholecalciferol  1,000 Units Oral Daily  . docusate sodium  200 mg Oral BID  . enoxaparin (LOVENOX) injection  40 mg Subcutaneous Q24H  . feeding supplement (ENSURE ENLIVE)  237 mL Oral BID BM  . insulin aspart  0-9 Units Subcutaneous TID WC  . ipratropium-albuterol  3 mL Nebulization BID  . mouth rinse  15 mL Mouth Rinse BID  . oxybutynin  5 mg Oral QHS  . pantoprazole (PROTONIX) IV  40 mg Intravenous Q24H  . polyvinyl alcohol  1 drop Both Eyes BID  . vitamin B-12  1,000 mcg Oral Daily   Continuous Infusions: PRN Meds:.albuterol, bisacodyl, hydrALAZINE, menthol-cetylpyridinium, [DISCONTINUED] ondansetron **OR** ondansetron (ZOFRAN) IV, oxyCODONE, senna    Time Spent in minutes   10 minutes   Luis Lyons M.D on 09/12/2018 at 8:51 AM  Between 7am to 7pm - Pager -  [phone]  After 7pm go to [email or web address] - password [email or web address]  And look for the night coverage person covering for me after hours  [facility] Office  [phone]    Subjective:   Luis Lyons today has, No headache, No chest pain, No abdominal pain, he is having regular bowel movements with no nausea, no new weakness tingling or numbness, No Cough - SOB.     Objective:   Vitals:   09/11/18 0906 09/11/18 1422 09/11/18 2103 09/12/18 0618  BP:  (!) 153/61 121/60 (!) 151/59  Pulse:  79 81 76  Resp:  20 19 (!) 21  Temp:  98.2 F (36.8 C) 97.8 F (36.6 C)  98 F (36.7 C)  TempSrc:   Oral Oral  SpO2: 99% 97% 95% 94%  Weight:      Height:        Wt Readings from Last 3 Encounters:  09/11/18 93.5 kg     Intake/Output Summary (Last 24 hours) at 09/12/2018 0851 Last data filed at 09/11/2018 1810 Gross per 24 hour  Intake 180 ml  Output 1000 ml  Net -820 ml    Exam Awake Alert, Oriented X 3, No new F.N deficits, Normal affect [place].AT,PERRAL Supple Neck,No JVD, No cervical lymphadenopathy appriciated.  Symmetrical Chest wall movement, Good air movement bilaterally, CTAB RRR,No Gallops,Rubs or new Murmurs, No Parasternal Heave +ve B.Sounds, Abd Soft, Non tender, midline abdominal incision under bandage and appears stable, no organomegaly appriciated, No rebound - guarding or rigidity. No Cyanosis, Clubbing or edema, No new Rash or bruise     Data Review

## 2018-09-13 LAB — GLUCOSE, CAPILLARY
Glucose-Capillary: 105 mg/dL — ABNORMAL HIGH (ref 70–99)
Glucose-Capillary: 106 mg/dL — ABNORMAL HIGH (ref 70–99)

## 2018-09-13 MED ORDER — IPRATROPIUM-ALBUTEROL 0.5-2.5 (3) MG/3ML IN SOLN: 3.0000 mL | Freq: Four times a day (QID) | RESPIRATORY_TRACT | Status: DC | PRN

## 2018-09-13 MED ORDER — BISACODYL 10 MG RE SUPP
10.0000 mg | Freq: Once | RECTAL | Status: AC
  Administered 2018-09-13: 10 mg via RECTAL
  Filled 2018-09-13: qty 1

## 2018-09-13 NOTE — Progress Notes (Signed)
[facility]                                                                                                                                                                         Patient Demographics  Luis Lyons, is a 77 y.o. male  EZM:[id number]  [id number]:[id number]  DOB - [birth year]  Admit date - 08/28/2018  Admitting Physician [name], MD  Outpatient Primary MD for the patient is Elson Clan, MD  LOS - 16   Chief Complaint  Patient presents with  . Abdominal Pain  . Shortness of Breath        Assessment & Plan    Patient seen briefly today due for discharge soon per Discharge done yesterday by on 09/11/18, no further issues, Vital signs stable, patient feels fine.  He is tolerating diet continues to have regular bowel movements, feels fine no nausea in fact he is hungry no pain or nausea.  He remains discharged.    Medications  Scheduled Meds: . arformoterol  15 mcg Nebulization BID  . aspirin  81 mg Oral Daily  . budesonide (PULMICORT) nebulizer solution  0.25 mg Nebulization BID  . cholecalciferol  1,000 Units Oral Daily  . docusate sodium  200 mg Oral BID  . enoxaparin (LOVENOX) injection  40 mg Subcutaneous Q24H  . feeding supplement (ENSURE ENLIVE)  237 mL Oral BID BM  . mouth rinse  15 mL Mouth Rinse BID  . oxybutynin  5 mg Oral QHS  . pantoprazole (PROTONIX) IV  40 mg Intravenous Q24H  . polyvinyl alcohol  1 drop Both Eyes BID  . vitamin B-12  1,000 mcg Oral Daily   Continuous Infusions: PRN Meds:.albuterol, bisacodyl, hydrALAZINE, ipratropium-albuterol, menthol-cetylpyridinium, [DISCONTINUED] ondansetron **OR** ondansetron (ZOFRAN) IV, oxyCODONE, senna    Time Spent in minutes   10 minutes   Lala Lund M.D on 09/13/2018 at 11:21 AM  Between 7am to 7pm - Pager - [phone]  After 7pm go to [email or web address] - password [email or web address]  And look for the night coverage  person covering for me after hours  [facility] Office  [phone]    Subjective:   Smiley Birr today has, No headache, No chest pain, No abdominal pain, he is having regular bowel movements with no nausea, no new weakness tingling or numbness, No Cough - SOB.     Objective:   Vitals:   09/13/18 0500 09/13/18 0510 09/13/18 0933 09/13/18 0939  BP:  (!) 155/62    Pulse:  74 80   Resp:  17 19   Temp:  97.8 F (36.6 C)    TempSrc:  Oral    SpO2:  95% 97% 97%  Weight: 93.1 kg     Height:  Wt Readings from Last 3 Encounters:  09/13/18 93.1 kg     Intake/Output Summary (Last 24 hours) at 09/13/2018 1121 Last data filed at 09/13/2018 1000 Gross per 24 hour  Intake 420 ml  Output 750 ml  Net -330 ml    Exam Awake Alert, Oriented X 3, No new F.N deficits, Normal affect [place].AT,PERRAL Supple Neck,No JVD, No cervical lymphadenopathy appriciated.  Symmetrical Chest wall movement, Good air movement bilaterally, CTAB RRR,No Gallops,Rubs or new Murmurs, No Parasternal Heave +ve B.Sounds, Abd Soft, Non tender, midline abdominal incision under bandage and appears stable, no organomegaly appriciated, No rebound - guarding or rigidity. No Cyanosis, Clubbing or edema, No new Rash or bruise     Data Review

## 2018-09-13 NOTE — Progress Notes (Signed)
Patient and daughter aware of Appeal denial and state that the patient feels much better today. They are ready for discharge.   Percell Locus Dailey Buccheri LCSW [phone]

## 2018-09-13 NOTE — Progress Notes (Signed)
Patient will DC to: [facility]) Anticipated DC date: 09/13/18 Family notified: Daughter at bedside Transport by: [facility] 12:30pm   Per MD patient ready for DC to [facility]. RN, patient, patient's family, and facility notified of DC. Discharge Summary and FL2 sent to facility. RN to call report prior to discharge ([phone]). DC packet on chart. Ambulance transport requested for patient.   CSW will sign off for now as social work intervention is no longer needed. Please consult [place] again if new needs arise.  Cedric Fishman, LCSW Clinical Social Worker [phone]

## 2018-09-13 NOTE — Progress Notes (Signed)
Luis Lyons to be D/C'd per MD order.  Discussed with the patient and all questions fully answered.  VSS, Skin clean, dry and intact without evidence of skin break down, no evidence of skin tears noted. IV catheter discontinued intact. Site without signs and symptoms of complications. Dressing and pressure applied.  An After Visit Summary was printed and given to the patient. Patient received prescription.  D/c education completed with patient/family including follow up instructions, medication list, d/c activities limitations if indicated, with other d/c instructions as indicated by MD - patient able to verbalize understanding, all questions fully answered.   Patient instructed to return to ED, call 911, or call MD for any changes in condition.   Patient escorted via [facility], and D/C home via private auto.  Nurse from [facility] notify and report given.   Luis Lyons 09/13/2018 1:09 PM

## 2018-09-13 NOTE — Progress Notes (Signed)
PT Cancellation Note  Patient Details Name: Luis Lyons MRN: [id number] DOB: 1941/06/11   Cancelled Treatment:    Reason Eval/Treat Not Completed: (P) Patient declined, no reason specified(Pt eating breakfast in bed on arrival.  Pt reports, " I am going home today.  I'm not getting out of bed.")   Luis Lyons 09/13/2018, 11:23 AM  Governor Rooks, PTA Acute Rehabilitation Services Pager [phone] Office [phone]

## 2019-04-20 ENCOUNTER — Emergency Department (HOSPITAL_COMMUNITY): Payer: Medicare Other

## 2019-04-20 ENCOUNTER — Other Ambulatory Visit: Payer: Self-pay

## 2019-04-20 ENCOUNTER — Encounter (HOSPITAL_COMMUNITY): Payer: Self-pay

## 2019-04-20 ENCOUNTER — Inpatient Hospital Stay (HOSPITAL_COMMUNITY)
Admission: EM | Admit: 2019-04-20 | Discharge: 2019-04-29 | DRG: 871 | Disposition: A | Discharge status: Skilled Nursing Facility | Payer: Medicare Other | Source: Skilled Nursing Facility | Attending: Internal Medicine | Admitting: Internal Medicine

## 2019-04-20 DIAGNOSIS — D649 Anemia, unspecified: Secondary | ICD-10-CM | POA: Diagnosis present

## 2019-04-20 DIAGNOSIS — Y95 Nosocomial condition: Secondary | ICD-10-CM | POA: Diagnosis present

## 2019-04-20 DIAGNOSIS — E876 Hypokalemia: Secondary | ICD-10-CM | POA: Diagnosis not present

## 2019-04-20 DIAGNOSIS — Z8673 Personal history of transient ischemic attack (TIA), and cerebral infarction without residual deficits: Secondary | ICD-10-CM | POA: Diagnosis not present

## 2019-04-20 DIAGNOSIS — Z87891 Personal history of nicotine dependence: Secondary | ICD-10-CM

## 2019-04-20 DIAGNOSIS — R0902 Hypoxemia: Secondary | ICD-10-CM

## 2019-04-20 DIAGNOSIS — Z1612 Extended spectrum beta lactamase (ESBL) resistance: Secondary | ICD-10-CM | POA: Diagnosis not present

## 2019-04-20 DIAGNOSIS — J189 Pneumonia, unspecified organism: Secondary | ICD-10-CM

## 2019-04-20 DIAGNOSIS — J9601 Acute respiratory failure with hypoxia: Secondary | ICD-10-CM | POA: Diagnosis present

## 2019-04-20 DIAGNOSIS — I2102 ST elevation (STEMI) myocardial infarction involving left anterior descending coronary artery: Secondary | ICD-10-CM | POA: Diagnosis not present

## 2019-04-20 DIAGNOSIS — N179 Acute kidney failure, unspecified: Secondary | ICD-10-CM | POA: Diagnosis not present

## 2019-04-20 DIAGNOSIS — I2510 Atherosclerotic heart disease of native coronary artery without angina pectoris: Secondary | ICD-10-CM | POA: Diagnosis present

## 2019-04-20 DIAGNOSIS — R05 Cough: Secondary | ICD-10-CM

## 2019-04-20 DIAGNOSIS — N289 Disorder of kidney and ureter, unspecified: Secondary | ICD-10-CM

## 2019-04-20 DIAGNOSIS — I2109 ST elevation (STEMI) myocardial infarction involving other coronary artery of anterior wall: Secondary | ICD-10-CM | POA: Diagnosis present

## 2019-04-20 DIAGNOSIS — R471 Dysarthria and anarthria: Secondary | ICD-10-CM | POA: Diagnosis present

## 2019-04-20 DIAGNOSIS — I471 Supraventricular tachycardia: Secondary | ICD-10-CM | POA: Diagnosis not present

## 2019-04-20 DIAGNOSIS — I4891 Unspecified atrial fibrillation: Secondary | ICD-10-CM | POA: Diagnosis not present

## 2019-04-20 DIAGNOSIS — Z993 Dependence on wheelchair: Secondary | ICD-10-CM

## 2019-04-20 DIAGNOSIS — I959 Hypotension, unspecified: Secondary | ICD-10-CM | POA: Diagnosis present

## 2019-04-20 DIAGNOSIS — A411 Sepsis due to other specified staphylococcus: Principal | ICD-10-CM | POA: Diagnosis present

## 2019-04-20 DIAGNOSIS — R739 Hyperglycemia, unspecified: Secondary | ICD-10-CM | POA: Diagnosis not present

## 2019-04-20 DIAGNOSIS — Z20828 Contact with and (suspected) exposure to other viral communicable diseases: Secondary | ICD-10-CM | POA: Diagnosis present

## 2019-04-20 DIAGNOSIS — B9620 Unspecified Escherichia coli [E. coli] as the cause of diseases classified elsewhere: Secondary | ICD-10-CM | POA: Diagnosis present

## 2019-04-20 DIAGNOSIS — J1529 Pneumonia due to other staphylococcus: Secondary | ICD-10-CM | POA: Diagnosis not present

## 2019-04-20 DIAGNOSIS — Z8546 Personal history of malignant neoplasm of prostate: Secondary | ICD-10-CM | POA: Diagnosis not present

## 2019-04-20 DIAGNOSIS — I110 Hypertensive heart disease with heart failure: Secondary | ICD-10-CM | POA: Diagnosis present

## 2019-04-20 DIAGNOSIS — I69354 Hemiplegia and hemiparesis following cerebral infarction affecting left non-dominant side: Secondary | ICD-10-CM | POA: Diagnosis not present

## 2019-04-20 DIAGNOSIS — N390 Urinary tract infection, site not specified: Secondary | ICD-10-CM | POA: Diagnosis present

## 2019-04-20 DIAGNOSIS — I10 Essential (primary) hypertension: Secondary | ICD-10-CM | POA: Diagnosis not present

## 2019-04-20 DIAGNOSIS — Z7982 Long term (current) use of aspirin: Secondary | ICD-10-CM

## 2019-04-20 DIAGNOSIS — Z66 Do not resuscitate: Secondary | ICD-10-CM | POA: Diagnosis present

## 2019-04-20 DIAGNOSIS — R059 Cough, unspecified: Secondary | ICD-10-CM

## 2019-04-20 DIAGNOSIS — B964 Proteus (mirabilis) (morganii) as the cause of diseases classified elsewhere: Secondary | ICD-10-CM | POA: Diagnosis present

## 2019-04-20 DIAGNOSIS — I5041 Acute combined systolic (congestive) and diastolic (congestive) heart failure: Secondary | ICD-10-CM | POA: Diagnosis not present

## 2019-04-20 DIAGNOSIS — R9431 Abnormal electrocardiogram [ECG] [EKG]: Secondary | ICD-10-CM | POA: Diagnosis not present

## 2019-04-20 DIAGNOSIS — I255 Ischemic cardiomyopathy: Secondary | ICD-10-CM | POA: Diagnosis not present

## 2019-04-20 DIAGNOSIS — I214 Non-ST elevation (NSTEMI) myocardial infarction: Secondary | ICD-10-CM | POA: Diagnosis not present

## 2019-04-20 DIAGNOSIS — I5042 Chronic combined systolic (congestive) and diastolic (congestive) heart failure: Secondary | ICD-10-CM | POA: Diagnosis not present

## 2019-04-20 DIAGNOSIS — A419 Sepsis, unspecified organism: Secondary | ICD-10-CM | POA: Diagnosis present

## 2019-04-20 DIAGNOSIS — I259 Chronic ischemic heart disease, unspecified: Secondary | ICD-10-CM | POA: Diagnosis not present

## 2019-04-20 DIAGNOSIS — R0600 Dyspnea, unspecified: Secondary | ICD-10-CM

## 2019-04-20 DIAGNOSIS — R609 Edema, unspecified: Secondary | ICD-10-CM

## 2019-04-20 DIAGNOSIS — J690 Pneumonitis due to inhalation of food and vomit: Secondary | ICD-10-CM | POA: Diagnosis present

## 2019-04-20 DIAGNOSIS — I213 ST elevation (STEMI) myocardial infarction of unspecified site: Secondary | ICD-10-CM | POA: Diagnosis not present

## 2019-04-20 DIAGNOSIS — Z85818 Personal history of malignant neoplasm of other sites of lip, oral cavity, and pharynx: Secondary | ICD-10-CM

## 2019-04-20 LAB — CBC WITH DIFFERENTIAL/PLATELET
Abs Immature Granulocytes: 0.15 10*3/uL — ABNORMAL HIGH (ref 0.00–0.07)
Basophils Absolute: 0.1 10*3/uL (ref 0.0–0.1)
Basophils Relative: 0 %
Eosinophils Absolute: 0 10*3/uL (ref 0.0–0.5)
Eosinophils Relative: 0 %
HCT: 37.9 % — ABNORMAL LOW (ref 39.0–52.0)
Hemoglobin: 11.7 g/dL — ABNORMAL LOW (ref 13.0–17.0)
Immature Granulocytes: 1 %
Lymphocytes Relative: 3 %
Lymphs Abs: 0.7 10*3/uL (ref 0.7–4.0)
MCH: 27.1 pg (ref 26.0–34.0)
MCHC: 30.9 g/dL (ref 30.0–36.0)
MCV: 87.7 fL (ref 80.0–100.0)
Monocytes Absolute: 1.2 10*3/uL — ABNORMAL HIGH (ref 0.1–1.0)
Monocytes Relative: 5 %
Neutro Abs: 20.7 10*3/uL — ABNORMAL HIGH (ref 1.7–7.7)
Neutrophils Relative %: 91 %
Platelets: 321 10*3/uL (ref 150–400)
RBC: 4.32 MIL/uL (ref 4.22–5.81)
RDW: 13.7 % (ref 11.5–15.5)
WBC: 22.8 10*3/uL — ABNORMAL HIGH (ref 4.0–10.5)
nRBC: 0 % (ref 0.0–0.2)

## 2019-04-20 LAB — SARS CORONAVIRUS 2 BY RT PCR (HOSPITAL ORDER, PERFORMED IN ~~LOC~~ HOSPITAL LAB): SARS Coronavirus 2: NEGATIVE

## 2019-04-20 LAB — URINALYSIS, ROUTINE W REFLEX MICROSCOPIC
Glucose, UA: 100 mg/dL — AB
Ketones, ur: 15 mg/dL — AB
Nitrite: POSITIVE — AB
Protein, ur: >300 mg/dL — AB
Specific Gravity, Urine: 1.02 (ref 1.005–1.030)
pH: 7.5 (ref 5.0–8.0)

## 2019-04-20 LAB — COMPREHENSIVE METABOLIC PANEL
ALT: 15 U/L (ref 0–44)
AST: 26 U/L (ref 15–41)
Albumin: 2.8 g/dL — ABNORMAL LOW (ref 3.5–5.0)
Alkaline Phosphatase: 86 U/L (ref 38–126)
Anion gap: 13 (ref 5–15)
BUN: 24 mg/dL — ABNORMAL HIGH (ref 8–23)
CO2: 19 mmol/L — ABNORMAL LOW (ref 22–32)
Calcium: 9.2 mg/dL (ref 8.9–10.3)
Chloride: 103 mmol/L (ref 98–111)
Creatinine, Ser: 1.69 mg/dL — ABNORMAL HIGH (ref 0.61–1.24)
GFR calc Af Amer: 45 mL/min — ABNORMAL LOW (ref >60)
GFR calc non Af Amer: 39 mL/min — ABNORMAL LOW (ref >60)
Glucose, Bld: 235 mg/dL — ABNORMAL HIGH (ref 70–99)
Potassium: 3.4 mmol/L — ABNORMAL LOW (ref 3.5–5.1)
Sodium: 135 mmol/L (ref 135–145)
Total Bilirubin: 1.2 mg/dL (ref 0.3–1.2)
Total Protein: 8.2 g/dL — ABNORMAL HIGH (ref 6.5–8.1)

## 2019-04-20 LAB — URINALYSIS, MICROSCOPIC (REFLEX)

## 2019-04-20 LAB — PROTIME-INR
INR: 1.2 (ref 0.8–1.2)
Prothrombin Time: 15.1 seconds (ref 11.4–15.2)

## 2019-04-20 LAB — LACTIC ACID, PLASMA: Lactic Acid, Venous: 2.1 mmol/L (ref 0.5–1.9)

## 2019-04-20 MED ORDER — POTASSIUM CHLORIDE 10 MEQ/100ML IV SOLN
10.0000 meq | INTRAVENOUS | Status: AC
  Administered 2019-04-20 – 2019-04-21 (×3): 10 meq via INTRAVENOUS
  Filled 2019-04-20 (×3): qty 100

## 2019-04-20 MED ORDER — VANCOMYCIN HCL 10 G IV SOLR
1750.0000 mg | Freq: Once | INTRAVENOUS | Status: AC
  Administered 2019-04-20: 1750 mg via INTRAVENOUS
  Filled 2019-04-20: qty 1750

## 2019-04-20 MED ORDER — SODIUM CHLORIDE 0.9 % IV SOLN
1000.0000 mL | INTRAVENOUS | Status: DC
  Administered 2019-04-20: 1000 mL via INTRAVENOUS

## 2019-04-20 MED ORDER — SODIUM CHLORIDE 0.9% FLUSH
3.0000 mL | Freq: Two times a day (BID) | INTRAVENOUS | Status: DC
  Administered 2019-04-21 – 2019-04-28 (×10): 3 mL via INTRAVENOUS

## 2019-04-20 MED ORDER — SODIUM CHLORIDE 0.9 % IV SOLN
1000.0000 mL | INTRAVENOUS | Status: AC
  Administered 2019-04-21: 1000 mL via INTRAVENOUS

## 2019-04-20 MED ORDER — ACETAMINOPHEN 650 MG RE SUPP: 650.0000 mg | Freq: Four times a day (QID) | RECTAL | Status: DC | PRN

## 2019-04-20 MED ORDER — VANCOMYCIN HCL 10 G IV SOLR: 15.0000 mg/kg | Freq: Once | INTRAVENOUS | Status: DC

## 2019-04-20 MED ORDER — SODIUM CHLORIDE 0.9 % IV BOLUS
1000.0000 mL | Freq: Once | INTRAVENOUS | Status: AC
  Administered 2019-04-20: 22:00:00 1000 mL via INTRAVENOUS

## 2019-04-20 MED ORDER — POLYETHYLENE GLYCOL 3350 17 G PO PACK
17.0000 g | PACK | Freq: Every day | ORAL | Status: DC | PRN
  Filled 2019-04-20: qty 1

## 2019-04-20 MED ORDER — PIPERACILLIN-TAZOBACTAM 3.375 G IVPB
3.3750 g | Freq: Three times a day (TID) | INTRAVENOUS | Status: DC
  Filled 2019-04-20: qty 50

## 2019-04-20 MED ORDER — PIPERACILLIN-TAZOBACTAM 3.375 G IVPB 30 MIN
3.3750 g | Freq: Once | INTRAVENOUS | Status: AC
  Administered 2019-04-20: 3.375 g via INTRAVENOUS
  Filled 2019-04-20: qty 50

## 2019-04-20 MED ORDER — HEPARIN SODIUM (PORCINE) 5000 UNIT/ML IJ SOLN
5000.0000 [IU] | Freq: Three times a day (TID) | INTRAMUSCULAR | Status: DC
  Administered 2019-04-21 (×2): 5000 [IU] via SUBCUTANEOUS
  Filled 2019-04-20 (×2): qty 1

## 2019-04-20 MED ORDER — INSULIN ASPART 100 UNIT/ML ~~LOC~~ SOLN
0.0000 [IU] | SUBCUTANEOUS | Status: DC
  Administered 2019-04-21: 1 [IU] via SUBCUTANEOUS
  Administered 2019-04-21: 2 [IU] via SUBCUTANEOUS
  Administered 2019-04-21: 1 [IU] via SUBCUTANEOUS
  Administered 2019-04-21: 2 [IU] via SUBCUTANEOUS
  Administered 2019-04-21 – 2019-04-26 (×8): 1 [IU] via SUBCUTANEOUS

## 2019-04-20 MED ORDER — VANCOMYCIN HCL 10 G IV SOLR
1250.0000 mg | INTRAVENOUS | Status: DC
  Administered 2019-04-21: 22:00:00 1250 mg via INTRAVENOUS
  Filled 2019-04-20 (×2): qty 1250

## 2019-04-20 MED ORDER — ACETAMINOPHEN 325 MG PO TABS: 650.0000 mg | ORAL_TABLET | Freq: Four times a day (QID) | ORAL | Status: DC | PRN

## 2019-04-20 NOTE — ED Notes (Signed)
Attempted report x1. 

## 2019-04-20 NOTE — ED Provider Notes (Signed)
[facility] EMERGENCY DEPARTMENT Provider Note   CSN: [id number] Arrival date & time: 04/20/19  1837    History   Chief Complaint Chief Complaint  Patient presents with   Shortness of Breath    HPI Luis Lyons is a 78 y.o. male.     Patient with history of high blood pressure, pneumonia, bowel obstruction presents with worsening shortness of breath and fever from the nursing home.  Worsened this evening.  Patient staying at [facility].  Patient initially was given CPAP by EMS and put on nonrebreather on arrival.  Temperature is 103.8 for EMS.  Patient was given steroids magnesium and epi from EMS.     Past Medical History:  Diagnosis Date   Abdominal hernia    Cancer ([place])    Constipation    Cough    Depression    Dry mouth    Hard of hearing    Hypertension    Muscle weakness    Stroke [facility])    Urinary incontinence    Vitamin D deficiency    Vomiting    Wheezing     Patient Active Problem List   Diagnosis Date Noted   CAP (community acquired pneumonia) 08/29/2018   Essential hypertension 08/29/2018   Acute bronchitis 08/29/2018   SBO (small bowel obstruction) ([place]) 08/28/2018    Past Surgical History:  Procedure Laterality Date   ABDOMINAL SURGERY     LAPAROTOMY N/A 09/05/2018   Procedure: EXPLORATORY LAPAROTOMY;  Surgeon: Georganna Skeans, MD;  Location: [facility];  Service: General;  Laterality: N/A;        Home Medications    Prior to Admission medications   Medication Sig Start Date End Date Taking? Authorizing Provider  acetaminophen (TYLENOL) 500 MG tablet Take 500 mg by mouth every 4 (four) hours as needed for mild pain.    [provider]  albuterol (PROVENTIL) (2.5 MG/3ML) 0.083% nebulizer solution Take 2.5 mg by nebulization 2 (two) times daily as needed for wheezing or shortness of breath.    [provider]  aspirin 81 MG chewable tablet Chew 81 mg by mouth daily.    [provider]  chlorhexidine (PERIDEX) 0.12 % solution 15 mLs by Mouth Rinse route daily.    [provider]  cholecalciferol (VITAMIN D3) 25 MCG (1000 UT) tablet Take 1,000 Units by mouth daily.    [provider]  dextromethorphan (DELSYM) 30 MG/5ML liquid Take 60 mg by mouth 2 (two) times daily as needed for cough.    [provider]  fluticasone (FLONASE) 50 MCG/ACT nasal spray Place 1 spray into both nostrils daily.    [provider]  HYDROcodone-acetaminophen (NORCO/VICODIN) 5-325 MG tablet Take 1 tablet by mouth every 6 (six) hours as needed for severe pain. 09/11/18   Thurnell Lose, MD  ibuprofen (ADVIL,MOTRIN) 200 MG tablet Take 400 mg by mouth every 6 (six) hours as needed for moderate pain.    [provider]  ipratropium-albuterol (DUONEB) 0.5-2.5 (3) MG/3ML SOLN Take 3 mLs by nebulization 4 (four) times daily.     [provider]  Lactobacillus (ACIDOPHILUS) CAPS capsule Take 2 capsules by mouth 2 (two) times daily.    [provider]  magnesium hydroxide (MILK OF MAGNESIA) 400 MG/5ML suspension Take 30 mLs by mouth daily as needed for mild constipation.    [provider]  oxybutynin (DITROPAN-XL) 5 MG 24 hr tablet Take 5 mg by mouth at bedtime.    [provider]  phenazopyridine (PYRIDIUM) 200 MG tablet Take 200 mg by mouth 3 (three) times daily as needed for pain.    [provider]  polyethylene glycol (MIRALAX / GLYCOLAX) packet Take 17 g by mouth daily.    [provider]  polyvinyl alcohol (LIQUIFILM TEARS) 1.4 % ophthalmic solution Place 1 drop into both eyes 2 (two) times daily.    [provider]  senna (SENOKOT) 8.6 MG tablet Take 1 tablet by mouth 2 (two) times daily as needed for constipation.    [provider]  sodium fluoride (DENTAGEL) 1.1 % GEL dental gel Place 1 application onto teeth 2 (two) times daily.    [provider]  vitamin B-12  (CYANOCOBALAMIN) 1000 MCG tablet Take 1,000 mcg by mouth daily.    [provider]  zinc oxide 20 % ointment Apply 1 application topically 2 (two) times daily as needed for irritation.    [provider]    Family History Family History  Family history unknown: Yes    Social History Social History   Tobacco Use   Smoking status: Former Smoker   Smokeless tobacco: Never Used  Substance Use Topics   Alcohol use: Not Currently   Drug use: Never     Allergies   Patient has no known allergies.   Review of Systems Review of Systems  Unable to perform ROS: Acuity of condition     Physical Exam Updated Vital Signs BP 106/64    Pulse (!) 102    Resp (!) 25    Ht 6\' 2"  (1.88 m)    Wt 90.7 kg    SpO2 99%    BMI 25.68 kg/m   Physical Exam Vitals signs and nursing note reviewed.  Constitutional:      Appearance: He is well-developed.  HENT:     Head: Normocephalic and atraumatic.  Eyes:     General:        Right eye: No discharge.        Left eye: No discharge.     Conjunctiva/sclera: Conjunctivae normal.  Neck:     Musculoskeletal: Normal range of motion and neck supple.     Trachea: No tracheal deviation.  Cardiovascular:     Rate and Rhythm: Regular rhythm. Tachycardia present.  Pulmonary:     Effort: Pulmonary effort is normal.     Breath sounds: Examination of the right-middle field reveals decreased breath sounds and rhonchi. Examination of the left-middle field reveals decreased breath sounds and rhonchi. Decreased breath sounds and rhonchi present.  Abdominal:     General: There is no distension.     Palpations: Abdomen is soft.     Tenderness: There is no abdominal tenderness. There is no guarding.  Musculoskeletal:     Right lower leg: He exhibits no tenderness.     Left lower leg: He exhibits no tenderness.  Skin:    General: Skin is warm.     Findings: No rash.  Neurological:     Mental Status: He is alert and oriented to person,  place, and time.  Psychiatric:        Mood and Affect: Mood is not anxious.        Behavior: Behavior is cooperative.     Comments: Mild confusion.      ED Treatments / Results  Labs (all labs ordered are listed, but only abnormal results are displayed) Labs Reviewed  LACTIC ACID, PLASMA - Abnormal; Notable for the following components:  Result Value   Lactic Acid, Venous 2.1 (*)    All other components within normal limits  COMPREHENSIVE METABOLIC PANEL - Abnormal; Notable for the following components:   Potassium 3.4 (*)    CO2 19 (*)    Glucose, Bld 235 (*)    BUN 24 (*)    Creatinine, Ser 1.69 (*)    Total Protein 8.2 (*)    Albumin 2.8 (*)    GFR calc non Af Amer 39 (*)    GFR calc Af Amer 45 (*)    All other components within normal limits  CBC WITH DIFFERENTIAL/PLATELET - Abnormal; Notable for the following components:   WBC 22.8 (*)    Hemoglobin 11.7 (*)    HCT 37.9 (*)    Neutro Abs 20.7 (*)    Monocytes Absolute 1.2 (*)    Abs Immature Granulocytes 0.15 (*)    All other components within normal limits  URINALYSIS, ROUTINE W REFLEX MICROSCOPIC - Abnormal; Notable for the following components:   APPearance CLOUDY (*)    Glucose, UA 100 (*)    Hgb urine dipstick LARGE (*)    Bilirubin Urine SMALL (*)    Ketones, ur 15 (*)    Protein, ur >300 (*)    Nitrite POSITIVE (*)    Leukocytes,Ua MODERATE (*)    All other components within normal limits  URINALYSIS, MICROSCOPIC (REFLEX) - Abnormal; Notable for the following components:   Bacteria, UA MANY (*)    All other components within normal limits  CULTURE, BLOOD (ROUTINE X 2)  CULTURE, BLOOD (ROUTINE X 2)  URINE CULTURE  SARS CORONAVIRUS 2 (HOSPITAL ORDER, [facility])  PROTIME-INR  LACTIC ACID, PLASMA    EKG EKG Interpretation  Date/Time:  Saturday April 20 2019 18:48:24 EDT Ventricular Rate:  133 PR Interval:    QRS Duration: 115 QT Interval:  354 QTC  Calculation: 527 R Axis:   -28 Text Interpretation:  Sinus or ectopic atrial tachycardia Nonspecific intraventricular conduction delay Probable anteroseptal infarct, old Confirmed by Elnora Morrison [phone]) on 04/20/2019 6:54:40 PM   Radiology Dg Chest Port 1 View  Result Date: 04/20/2019 CLINICAL DATA:  Respiratory distress EXAM: PORTABLE CHEST 1 VIEW COMPARISON:  September 02, 2018 FINDINGS: The patient is rotated which limits evaluation. There airspace opacities in the right middle lobe and at the left lung base. No pneumothorax. Pleuroparenchymal scarring is noted at the lung apices. The heart size is stable. Aortic calcifications are again noted. There is no acute osseous abnormality. No pneumothorax. IMPRESSION: Probable opacities involving the left lower lobe and right middle lobe concerning for pneumonia, aspiration, or atelectasis. Electronically Signed   By: Constance Holster M.D.   On: 04/20/2019 19:49    Procedures .Critical Care Performed by: Elnora Morrison, MD Authorized by: Elnora Morrison, MD   Critical care provider statement:    Critical care time (minutes):  40   Critical care start time:  04/20/2019 8:00 PM   Critical care end time:  04/20/2019 8:40 PM   Critical care time was exclusive of:  Separately billable procedures and treating other patients and teaching time   Critical care was necessary to treat or prevent imminent or life-threatening deterioration of the following conditions:  Sepsis   Critical care was time spent personally by me on the following activities:  Evaluation of patient's response to treatment, examination of patient, ordering and performing treatments and interventions, ordering and review of laboratory studies, ordering and review of radiographic studies,  pulse oximetry, re-evaluation of patient's condition, obtaining history from patient or surrogate and review of old charts   (including critical care time)  Medications Ordered in ED Medications   0.9 %  sodium chloride infusion (1,000 mLs Intravenous New Bag/Given 04/20/19 1951)  piperacillin-tazobactam (ZOSYN) IVPB 3.375 g (3.375 g Intravenous New Bag/Given 04/20/19 2043)  vancomycin (VANCOCIN) 1,750 mg in sodium chloride 0.9 % 500 mL IVPB (has no administration in time range)  sodium chloride 0.9 % bolus 1,000 mL (has no administration in time range)  piperacillin-tazobactam (ZOSYN) IVPB 3.375 g (has no administration in time range)  vancomycin (VANCOCIN) 1,250 mg in sodium chloride 0.9 % 250 mL IVPB (has no administration in time range)     Initial Impression / Assessment and Plan / ED Course  I have reviewed the triage vital signs and the nursing notes.  Pertinent labs & imaging results that were available during my care of the patient were reviewed by me and considered in my medical decision making (see chart for details).       Patient presents with worsening shortness of breath fever and requiring oxygenation.  Concern clinically for pneumonia and the bacteria or viral/COVID.  Code sepsis initiated with increased oxygen requirement, fever and increased work of breathing.  IV fluid bolus ordered and given.  Chest x-ray reviewed consistent with pneumonia, broad antibiotics IV ordered, culture sent.  Plan to titrate oxygen to nasal cannula from nonrebreather. COVID test pending.  Plan for admission to the hospital Discussed with pharmacy for IV antibiotics.  Updated patient's son on patient status and plan for admission.  Patient does have a DNR.  Patient son does appreciate regular updates when available [phone].  Luis Lyons was evaluated in Emergency Department on 04/20/2019 for the symptoms described in the history of present illness. He was evaluated in the context of the global COVID-19 pandemic, which necessitated consideration that the patient might be at risk for infection with the SARS-CoV-2 virus that causes COVID-19. Institutional protocols and algorithms that  pertain to the evaluation of patients at risk for COVID-19 are in a state of rapid change based on information released by regulatory bodies including the CDC and federal and state organizations. These policies and algorithms were followed during the patient's care in the ED.  The patients results and plan were reviewed and discussed.   Any x-rays performed were independently reviewed by myself.   Differential diagnosis were considered with the presenting HPI.  Medications  0.9 %  sodium chloride infusion (1,000 mLs Intravenous New Bag/Given 04/20/19 1951)  piperacillin-tazobactam (ZOSYN) IVPB 3.375 g (3.375 g Intravenous New Bag/Given 04/20/19 2043)  vancomycin (VANCOCIN) 1,750 mg in sodium chloride 0.9 % 500 mL IVPB (has no administration in time range)  sodium chloride 0.9 % bolus 1,000 mL (has no administration in time range)  piperacillin-tazobactam (ZOSYN) IVPB 3.375 g (has no administration in time range)  vancomycin (VANCOCIN) 1,250 mg in sodium chloride 0.9 % 250 mL IVPB (has no administration in time range)    Vitals:   04/20/19 1900 04/20/19 1953 04/20/19 2015 04/20/19 2030  BP: 122/63  116/74 106/64  Pulse: (!) 130   (!) 102  Resp: (!) 38  (!) 34 (!) 25  SpO2: 98%   99%  Weight:  90.7 kg    Height:  6\' 2"  (1.88 m)      Final diagnoses:  Acute dyspnea  Hypoxia  HCAP (healthcare-associated pneumonia)    Admission/ observation were discussed with the admitting physician, patient and/or  family and they are comfortable with the plan.    Final Clinical Impressions(s) / ED Diagnoses   Final diagnoses:  Acute dyspnea  Hypoxia  HCAP (healthcare-associated pneumonia)    ED Discharge Orders    None       Elnora Morrison, MD 04/21/19 2340

## 2019-04-20 NOTE — H&P (Signed)
History and Physical    Luis Lyons EHU:[id number] DOB: 10-16-1940 DOA: 04/20/2019  PCP: Elson Clan, MD   Patient coming from: SNF   Chief Complaint: Fever, acute respiratory distress  HPI: Luis Lyons is a 78 y.o. male with medical history significant for prostate cancer status post prostatectomy, hypertension, history of CVA, now presenting to the emergency department from his SNF for evaluation of fever and acute respiratory distress with hypoxia.  Patient reportedly had acute worsening in his shortness of breath at his nursing facility this evening, had a fever to 103.8 F, was treated with Tylenol, and placed on CPAP by nursing facility personnel, and EMS was called.  Patient was treated with 125 mg IV Solu-Medrol, IM epinephrine, and 2 g of magnesium by EMS and brought into the ED.  Patient is unable to contribute to the history due to his clinical condition with acute respiratory distress.  ED Course: Upon arrival to the ED, patient is found to be tachycardic to the 785Y, systolic blood pressure 850, saturating 100% on nonrebreather at 15 L/min, and with temp 99.1 F.  EKG features a sinus or ectopic atrial tachycardia with rate 133 and prolonged QT interval.  Chest x-ray is notable for probable opacities involving the left lower and right middle lobes concerning for multifocal pneumonia, aspiration, or atelectasis.  Chemistry panel features a slight hypokalemia, bicarbonate 19, glucose 235, and creatinine 1.69, up from 1.1 in December.  CBC features a leukocytosis to 22,800 and a mild normocytic anemia.  Lactic acid is slightly elevated.  Urinalysis is nitrite positive.  Blood cultures were collected, liter of normal saline was administered, COVID-19 testing is negative, patient was treated with vancomycin and Zosyn, and hospitalists are consulted for admission.  Review of Systems:  All other systems reviewed and apart from HPI, are negative.  Past Medical History:  Diagnosis Date  .  Abdominal hernia   . Cancer ([place])   . Constipation   . Cough   . Depression   . Dry mouth   . Hard of hearing   . Hypertension   . Muscle weakness   . Stroke ([place])   . Urinary incontinence   . Vitamin D deficiency   . Vomiting   . Wheezing     Past Surgical History:  Procedure Laterality Date  . ABDOMINAL SURGERY    . LAPAROTOMY N/A 09/05/2018   Procedure: EXPLORATORY LAPAROTOMY;  Surgeon: Georganna Skeans, MD;  Location: [facility];  Service: General;  Laterality: N/A;     reports that he has quit smoking. He has never used smokeless tobacco. He reports previous alcohol use. He reports that he does not use drugs.  No Known Allergies  Family History  Family history unknown: Yes     Prior to Admission medications   Medication Sig Start Date End Date Taking? Authorizing Provider  acetaminophen (TYLENOL) 500 MG tablet Take 500 mg by mouth every 4 (four) hours as needed for mild pain.    [provider]  albuterol (PROVENTIL) (2.5 MG/3ML) 0.083% nebulizer solution Take 2.5 mg by nebulization 2 (two) times daily as needed for wheezing or shortness of breath.    [provider]  aspirin 81 MG chewable tablet Chew 81 mg by mouth daily.    [provider]  chlorhexidine (PERIDEX) 0.12 % solution 15 mLs by Mouth Rinse route daily.    [provider]  cholecalciferol (VITAMIN D3) 25 MCG (1000 UT) tablet Take 1,000 Units by mouth daily.    [provider]  dextromethorphan (DELSYM) 30 MG/5ML liquid Take 60 mg by mouth 2 (two) times daily as needed for cough.    [provider]  fluticasone (FLONASE) 50 MCG/ACT nasal spray Place 1 spray into both nostrils daily.    [provider]  HYDROcodone-acetaminophen (NORCO/VICODIN) 5-325 MG tablet Take 1 tablet by mouth every 6 (six) hours as needed for severe pain. 09/11/18   Thurnell Lose, MD  ibuprofen (ADVIL,MOTRIN) 200 MG tablet Take 400 mg by mouth every 6 (six) hours as needed  for moderate pain.    [provider]  ipratropium-albuterol (DUONEB) 0.5-2.5 (3) MG/3ML SOLN Take 3 mLs by nebulization 4 (four) times daily.     [provider]  Lactobacillus (ACIDOPHILUS) CAPS capsule Take 2 capsules by mouth 2 (two) times daily.    [provider]  magnesium hydroxide (MILK OF MAGNESIA) 400 MG/5ML suspension Take 30 mLs by mouth daily as needed for mild constipation.    [provider]  oxybutynin (DITROPAN-XL) 5 MG 24 hr tablet Take 5 mg by mouth at bedtime.    [provider]  phenazopyridine (PYRIDIUM) 200 MG tablet Take 200 mg by mouth 3 (three) times daily as needed for pain.    [provider]  polyethylene glycol (MIRALAX / GLYCOLAX) packet Take 17 g by mouth daily.    [provider]  polyvinyl alcohol (LIQUIFILM TEARS) 1.4 % ophthalmic solution Place 1 drop into both eyes 2 (two) times daily.    [provider]  senna (SENOKOT) 8.6 MG tablet Take 1 tablet by mouth 2 (two) times daily as needed for constipation.    [provider]  sodium fluoride (DENTAGEL) 1.1 % GEL dental gel Place 1 application onto teeth 2 (two) times daily.    [provider]  vitamin B-12 (CYANOCOBALAMIN) 1000 MCG tablet Take 1,000 mcg by mouth daily.    [provider]  zinc oxide 20 % ointment Apply 1 application topically 2 (two) times daily as needed for irritation.    [provider]    Physical Exam: Vitals:   04/20/19 2015 04/20/19 2030 04/20/19 2045 04/20/19 2113  BP: 116/74 106/64 110/64   Pulse:  (!) 102    Resp: (!) 34 (!) 25 (!) 43   Temp:    100.2 F (37.9 C)  TempSrc:    Rectal  SpO2:  99% (!) 80%   Weight:      Height:        Constitutional: Obtunded, tachypneic, no diaphoresis  Eyes: PERTLA, lids and conjunctivae normal ENMT: Mucous membranes are moist. Posterior pharynx clear of any exudate or lesions.   Neck: normal, supple, no masses, no thyromegaly  Respiratory: Tachypneic, accessory muscle use. Coarse rhonchi bilaterally. No pallor or cyanosis.  Cardiovascular: S1 & S2 heard, regular rate and rhythm. No extremity edema.   Abdomen: No distension, lower abdominal tenderness, no rebound pain or guarding. Bowel sounds active.  Musculoskeletal: no clubbing / cyanosis. No joint deformity upper and lower extremities.  Skin: no significant rashes, lesions, ulcers. Warm, dry, well-perfused. Neurologic: No gross facial asymmetry. Somnolent, wakes briefly and makes eye-contact, no verbalizations, not following commands, moving all extremities spontaneously.     Labs on Admission: I have personally reviewed following labs and imaging studies  CBC: Recent Labs  Lab 04/20/19 1911  WBC 22.8*  NEUTROABS 20.7*  HGB 11.7*  HCT 37.9*  MCV 87.7  PLT 025   Basic Metabolic Panel: Recent Labs  Lab 04/20/19 1911  NA  135  K 3.4*  CL 103  CO2 19*  GLUCOSE 235*  BUN 24*  CREATININE 1.69*  CALCIUM 9.2   GFR: Estimated Creatinine Clearance: 43.2 mL/min (A) (by C-G formula based on SCr of 1.69 mg/dL (H)). Liver Function Tests: Recent Labs  Lab 04/20/19 1911  AST 26  ALT 15  ALKPHOS 86  BILITOT 1.2  PROT 8.2*  ALBUMIN 2.8*   No results for input(s): LIPASE, AMYLASE in the last 168 hours. No results for input(s): AMMONIA in the last 168 hours. Coagulation Profile: Recent Labs  Lab 04/20/19 1911  INR 1.2   Cardiac Enzymes: No results for input(s): CKTOTAL, CKMB, CKMBINDEX, TROPONINI in the last 168 hours. BNP (last 3 results) No results for input(s): PROBNP in the last 8760 hours. HbA1C: No results for input(s): HGBA1C in the last 72 hours. CBG: No results for input(s): GLUCAP in the last 168 hours. Lipid Profile: No results for input(s): CHOL, HDL, LDLCALC, TRIG, CHOLHDL, LDLDIRECT in the last 72 hours. Thyroid Function Tests: No results for input(s): TSH, T4TOTAL, FREET4, T3FREE, THYROIDAB in the last 72 hours. Anemia  Panel: No results for input(s): VITAMINB12, FOLATE, FERRITIN, TIBC, IRON, RETICCTPCT in the last 72 hours. Urine analysis:    Component Value Date/Time   COLORURINE YELLOW 04/20/2019 2031   APPEARANCEUR CLOUDY (A) 04/20/2019 2031   LABSPEC 1.020 04/20/2019 2031   PHURINE 7.5 04/20/2019 2031   GLUCOSEU 100 (A) 04/20/2019 2031   HGBUR LARGE (A) 04/20/2019 2031   BILIRUBINUR SMALL (A) 04/20/2019 2031   KETONESUR 15 (A) 04/20/2019 2031   PROTEINUR >300 (A) 04/20/2019 2031   NITRITE POSITIVE (A) 04/20/2019 2031   LEUKOCYTESUR MODERATE (A) 04/20/2019 2031   Sepsis Labs: @LABRCNTIP (procalcitonin:4,lacticidven:4) ) Recent Results (from the past 240 hour(s))  SARS Coronavirus 2 (CEPHEID- Performed in [place] hospital lab), Hosp Order     Status: None   Collection Time: 04/20/19  7:58 PM   Specimen: Nasopharyngeal Swab  Result Value Ref Range Status   SARS Coronavirus 2 NEGATIVE NEGATIVE Final    Comment: (NOTE) If result is NEGATIVE SARS-CoV-2 target nucleic acids are NOT DETECTED. The SARS-CoV-2 RNA is generally detectable in upper and lower  respiratory specimens during the acute phase of infection. The lowest  concentration of SARS-CoV-2 viral copies this assay can detect is 250  copies / mL. A negative result does not preclude SARS-CoV-2 infection  and should not be used as the sole basis for treatment or other  patient management decisions.  A negative result may occur with  improper specimen collection / handling, submission of specimen other  than nasopharyngeal swab, presence of viral mutation(s) within the  areas targeted by this assay, and inadequate number of viral copies  (<250 copies / mL). A negative result must be combined with clinical  observations, patient history, and epidemiological information. If result is POSITIVE SARS-CoV-2 target nucleic acids are DETECTED. The SARS-CoV-2 RNA is generally detectable in upper and lower  respiratory specimens dur ing the  acute phase of infection.  Positive  results are indicative of active infection with SARS-CoV-2.  Clinical  correlation with patient history and other diagnostic information is  necessary to determine patient infection status.  Positive results do  not rule out bacterial infection or co-infection with other viruses. If result is PRESUMPTIVE POSTIVE SARS-CoV-2 nucleic acids MAY BE PRESENT.   A presumptive positive result was obtained on the submitted specimen  and confirmed on repeat testing.  While 2019 novel coronavirus  (SARS-CoV-2) nucleic acids  may be present in the submitted sample  additional confirmatory testing may be necessary for epidemiological  and / or clinical management purposes  to differentiate between  SARS-CoV-2 and other Sarbecovirus currently known to infect humans.  If clinically indicated additional testing with an alternate test  methodology [id number]) is advised. The SARS-CoV-2 RNA is generally  detectable in upper and lower respiratory sp ecimens during the acute  phase of infection. The expected result is Negative. Fact Sheet for Patients:  StrictlyIdeas.no Fact Sheet for Healthcare Providers: BankingDealers.co.za This test is not yet approved or cleared by the [place] FDA and has been authorized for detection and/or diagnosis of SARS-CoV-2 by FDA under an Emergency Use Authorization (EUA).  This EUA will remain in effect (meaning this test can be used) for the duration of the COVID-19 declaration under Section 564(b)(1) of the Act, 21 U.S.C. section 360bbb-3(b)(1), unless the authorization is terminated or revoked sooner. Performed at [facility], [place] [address] [place], [place] [address]      Radiological Exams on Admission: Dg Chest Port 1 View  Result Date: 04/20/2019 CLINICAL DATA:  Respiratory distress EXAM: PORTABLE CHEST 1 VIEW COMPARISON:  September 02, 2018 FINDINGS: The patient is  rotated which limits evaluation. There airspace opacities in the right middle lobe and at the left lung base. No pneumothorax. Pleuroparenchymal scarring is noted at the lung apices. The heart size is stable. Aortic calcifications are again noted. There is no acute osseous abnormality. No pneumothorax. IMPRESSION: Probable opacities involving the left lower lobe and right middle lobe concerning for pneumonia, aspiration, or atelectasis. Electronically Signed   By: Constance Holster M.D.   On: 04/20/2019 19:49    EKG: Independently reviewed. Sinus or ectopic atrial tachycardia, rate 133, QTc 527 ms.   Assessment/Plan   1. Sepsis secondary to multifocal PNA, UTI; acute hypoxic respiratory failure  - Presents with fever and acute respiratory distress with hypoxia, found to be tachycardic, tachypneic to 40's, with marked leukocytosis, mild lactate elevation, new renal insufficiency, CXR concerning for multifocal PNA, and nitrite-positive UA   - COVID-19 is negative and ED workup more suggestive of bacterial PNA  - Blood cultures collected in ED, urine culture ordered, 1 liter NS bolus given, and empiric antibiotics started with vancomycin and Zosyn   - Continue vancomycin and Zosyn for now given severity of illness and concern for aspiration, check strep pneumo and legionella antigen, culture sputum, consult SLP, continue supplemental O2, supportive care, follow cultures and clinical course    2. Renal insufficiency  - SCr is 1.69 on admission, up from 1.1 in December 2019  - Could be acute in setting of sepsis  - He was given a liter of NS in ED   - Renally-dose medications, continue IVF hydration while NPO, repeat chem panel in am   3. Prolonged QT interval  - QTc is 527 on admission EKG  - Continue cardiac monitoring, replace potassium, was given empiric mag by EMS pta, minimize QT-prolonging medications, repeat EKG in am    4. History of CVA   - Difficult neuro exam, pt obtunded, wakes  briefly and makes eye contact, moving all extremities spontaneously  - Oral meds on hold pending clinical improvement and SLP eval    5. Hyperglycemia  - Serum glucose is 235 in ED  - No A1c on file, no documented DM hx, likely from acute illness  - Monitor CBG's, glycemic-control with Novolog per SSI     PPE: Mask, face shield  DVT prophylaxis: sq heparin  Code Status: DNR, confirmed with son   Family Communication: Son was updated by phone  Consults called: None Admission status: Inpatient     Vianne Bulls, MD [facility] Pager [phone]  If 7PM-7AM, please contact night-coverage [email or web address] Password [facility]  04/20/2019, 9:16 PM

## 2019-04-20 NOTE — ED Notes (Signed)
Pt's brief was changed and a condom cath was placed on.

## 2019-04-20 NOTE — ED Notes (Signed)
Luis Lyons, Luis Lyons [place]- [phone] for updates

## 2019-04-20 NOTE — Progress Notes (Signed)
Pharmacy Antibiotic Note  Luis Lyons is a 78 y.o. male admitted on 04/20/2019 with sepsis.  Pharmacy has been consulted for vancomycin and zosyn dosing.  Presenting with acute respiratory distress (transfer from [facility]) - WBC 22.8, LA 2.1, febrile per EMS. Scr 1.69 (CrCl ~43 mL/min).   Plan: Zosyn 3.375g IV q8h (4 hour infusion). Vancomycin 1750 mg IV once then 1250 mg IV every 24 hours  Monitor renal fx, cx results, clinical pic, and vanc levels as appropriate.  Height: 6\' 2"  (188 cm) Weight: 200 lb (90.7 kg) IBW/kg (Calculated) : 82.2  No data recorded.  Recent Labs  Lab 04/20/19 1911  WBC 22.8*  CREATININE 1.69*  LATICACIDVEN 2.1*    Estimated Creatinine Clearance: 43.2 mL/min (A) (by C-G formula based on SCr of 1.69 mg/dL (H)).    No Known Allergies  Antimicrobials this admission: Vanco 7/18 >>  Zosyn 7/18 >>   Dose adjustments this admission: N/A  Microbiology results: 7/18 BCx: sent 7/18 UCx: sent  7/18 COVID PCR: sent  Thank you for allowing pharmacy to be a part of this patient's care.  Antonietta Jewel, PharmD, [place] Clinical Pharmacist  Pager: [phone] Phone: [phone] 04/20/2019 8:44 PM

## 2019-04-20 NOTE — ED Notes (Signed)
ED TO INPATIENT HANDOFF REPORT  ED Nurse Name and Phone #: Annie Main [phone]  S Name/Age/Gender Luis Lyons 78 y.o. male Room/Bed: 024C/024C  Code Status   Code Status: DNR  Home/SNF/Other Skilled nursing facility Patient oriented to: self, place, time and situation Is this baseline? Yes   Triage Complete: Triage complete  Chief Complaint CPAP  Triage Note Pt [facility] from [facility] after acute onset of respiratory distress. Pt already on CPAP when ems arrived with spo2 95-96%, unknown spo2 prior to ems arrival. Pt currently 100% on nonrebreather at 15lpm. Pt initially had a temp of 103.8 temporal per ems, however, 99.65F on arrival to [facility]. Per EMS, nursing facility did administer unknown dose of tylenol at an unknown time. Pt received 0.3 epi, 125mg  solumedrol, and 2g magnesium by EMS. BP 134/64, HR 131 RR 36 with labored breathing.    Allergies No Known Allergies  Level of Care/Admitting Diagnosis ED Disposition    ED Disposition Condition [facility] Area: [place] [[id number]]  Level of Care: Progressive [102]  Covid Evaluation: Confirmed COVID Negative  Diagnosis: Sepsis due to pneumonia [facility]) [[id number]]  Admitting Physician: Vianne Bulls [[id number]]  Attending Physician: Vianne Bulls [[id number]]  Estimated length of stay: past midnight tomorrow  Certification:: I certify this patient will need inpatient services for at least 2 midnights  PT Class (Do Not Modify): Inpatient [101]  PT Acc Code (Do Not Modify): Private [1]       B Medical/Surgery History Past Medical History:  Diagnosis Date  . Abdominal hernia   . Cancer ([place])   . Constipation   . Cough   . Depression   . Dry mouth   . Hard of hearing   . Hypertension   . Muscle weakness   . Stroke ([place])   . Urinary incontinence   . Vitamin D deficiency   . Vomiting   . Wheezing    Past Surgical History:  Procedure Laterality Date  . ABDOMINAL  SURGERY    . LAPAROTOMY N/A 09/05/2018   Procedure: EXPLORATORY LAPAROTOMY;  Surgeon: Georganna Skeans, MD;  Location: [facility];  Service: General;  Laterality: N/A;     A IV Location/Drains/Wounds Patient Lines/Drains/Airways Status   Active Line/Drains/Airways    Name:   Placement date:   Placement time:   Site:   Days:   Peripheral IV 04/20/19 Anterior;Distal;Left Forearm   04/20/19    1851    Forearm   less than 1   Peripheral IV 04/20/19 Right Antecubital   04/20/19    1914    Antecubital   less than 1   External Urinary Catheter   09/07/18    1030    -   225   Incision (Closed) 09/05/18 Abdomen Other (Comment)   09/05/18    1230     227          Intake/Output Last 24 hours No intake or output data in the 24 hours ending 04/20/19 2142  Labs/Imaging Results for orders placed or performed during the hospital encounter of 04/20/19 (from the past 48 hour(s))  Lactic acid, plasma     Status: Abnormal   Collection Time: 04/20/19  7:11 PM  Result Value Ref Range   Lactic Acid, Venous 2.1 (HH) 0.5 - 1.9 mmol/L    Comment: CRITICAL RESULT CALLED TO, READ BACK BY AND VERIFIED WITH: Hampton Abbot RN AT 2011 04/20/2019 BY Karie Chimera Performed at [facility],  [address] [place], [place] [address]   Comprehensive metabolic panel     Status: Abnormal   Collection Time: 04/20/19  7:11 PM  Result Value Ref Range   Sodium 135 135 - 145 mmol/L   Potassium 3.4 (L) 3.5 - 5.1 mmol/L   Chloride 103 98 - 111 mmol/L   CO2 19 (L) 22 - 32 mmol/L   Glucose, Bld 235 (H) 70 - 99 mg/dL   BUN 24 (H) 8 - 23 mg/dL   Creatinine, Ser 1.69 (H) 0.61 - 1.24 mg/dL   Calcium 9.2 8.9 - 10.3 mg/dL   Total Protein 8.2 (H) 6.5 - 8.1 g/dL   Albumin 2.8 (L) 3.5 - 5.0 g/dL   AST 26 15 - 41 U/L   ALT 15 0 - 44 U/L   Alkaline Phosphatase 86 38 - 126 U/L   Total Bilirubin 1.2 0.3 - 1.2 mg/dL   GFR calc non Af Amer 39 (L) >60 mL/min   GFR calc Af Amer 45 (L) >60 mL/min   Anion gap 13 5 - 15    Comment:  Performed at [facility], [place] [address] [place], [place] [address]  CBC WITH DIFFERENTIAL     Status: Abnormal   Collection Time: 04/20/19  7:11 PM  Result Value Ref Range   WBC 22.8 (H) 4.0 - 10.5 K/uL   RBC 4.32 4.22 - 5.81 MIL/uL   Hemoglobin 11.7 (L) 13.0 - 17.0 g/dL   HCT 37.9 (L) 39.0 - 52.0 %   MCV 87.7 80.0 - 100.0 fL   MCH 27.1 26.0 - 34.0 pg   MCHC 30.9 30.0 - 36.0 g/dL   RDW 13.7 11.5 - 15.5 %   Platelets 321 150 - 400 K/uL   nRBC 0.0 0.0 - 0.2 %   Neutrophils Relative % 91 %   Neutro Abs 20.7 (H) 1.7 - 7.7 K/uL   Lymphocytes Relative 3 %   Lymphs Abs 0.7 0.7 - 4.0 K/uL   Monocytes Relative 5 %   Monocytes Absolute 1.2 (H) 0.1 - 1.0 K/uL   Eosinophils Relative 0 %   Eosinophils Absolute 0.0 0.0 - 0.5 K/uL   Basophils Relative 0 %   Basophils Absolute 0.1 0.0 - 0.1 K/uL   Immature Granulocytes 1 %   Abs Immature Granulocytes 0.15 (H) 0.00 - 0.07 K/uL    Comment: Performed at [facility] [address] [place], [place] [address]  Protime-INR     Status: None   Collection Time: 04/20/19  7:11 PM  Result Value Ref Range   Prothrombin Time 15.1 11.4 - 15.2 seconds   INR 1.2 0.8 - 1.2    Comment: (NOTE) INR goal varies based on device and disease states. Performed at [facility], [place] [address], [place], [place] [address]   SARS Coronavirus 2 (CEPHEID- Performed in [place] hospital lab), Hosp Order     Status: None   Collection Time: 04/20/19  7:58 PM   Specimen: Nasopharyngeal Swab  Result Value Ref Range   SARS Coronavirus 2 NEGATIVE NEGATIVE    Comment: (NOTE) If result is NEGATIVE SARS-CoV-2 target nucleic acids are NOT DETECTED. The SARS-CoV-2 RNA is generally detectable in upper and lower  respiratory specimens during the acute phase of infection. The lowest  concentration of SARS-CoV-2 viral copies this assay can detect is 250  copies / mL. A negative result does not preclude SARS-CoV-2 infection  and should not be used as the  sole  basis for treatment or other  patient management decisions.  A negative result may occur with  improper specimen collection / handling, submission of specimen other  than nasopharyngeal swab, presence of viral mutation(s) within the  areas targeted by this assay, and inadequate number of viral copies  (<250 copies / mL). A negative result must be combined with clinical  observations, patient history, and epidemiological information. If result is POSITIVE SARS-CoV-2 target nucleic acids are DETECTED. The SARS-CoV-2 RNA is generally detectable in upper and lower  respiratory specimens dur ing the acute phase of infection.  Positive  results are indicative of active infection with SARS-CoV-2.  Clinical  correlation with patient history and other diagnostic information is  necessary to determine patient infection status.  Positive results do  not rule out bacterial infection or co-infection with other viruses. If result is PRESUMPTIVE POSTIVE SARS-CoV-2 nucleic acids MAY BE PRESENT.   A presumptive positive result was obtained on the submitted specimen  and confirmed on repeat testing.  While 2019 novel coronavirus  (SARS-CoV-2) nucleic acids may be present in the submitted sample  additional confirmatory testing may be necessary for epidemiological  and / or clinical management purposes  to differentiate between  SARS-CoV-2 and other Sarbecovirus currently known to infect humans.  If clinically indicated additional testing with an alternate test  methodology [phone]) is advised. The SARS-CoV-2 RNA is generally  detectable in upper and lower respiratory sp ecimens during the acute  phase of infection. The expected result is Negative. Fact Sheet for Patients:  StrictlyIdeas.no Fact Sheet for Healthcare Providers: BankingDealers.co.za This test is not yet approved or cleared by the [place] FDA and has been authorized for detection  and/or diagnosis of SARS-CoV-2 by FDA under an Emergency Use Authorization (EUA).  This EUA will remain in effect (meaning this test can be used) for the duration of the COVID-19 declaration under Section 564(b)(1) of the Act, 21 U.S.C. section 360bbb-3(b)(1), unless the authorization is terminated or revoked sooner. Performed at [facility], [place] [address], [place], [place] [address]   Urinalysis, Routine w reflex microscopic     Status: Abnormal   Collection Time: 04/20/19  8:31 PM  Result Value Ref Range   Color, Urine YELLOW YELLOW   APPearance CLOUDY (A) CLEAR   Specific Gravity, Urine 1.020 1.005 - 1.030   pH 7.5 5.0 - 8.0   Glucose, UA 100 (A) NEGATIVE mg/dL   Hgb urine dipstick LARGE (A) NEGATIVE   Bilirubin Urine SMALL (A) NEGATIVE   Ketones, ur 15 (A) NEGATIVE mg/dL   Protein, ur >300 (A) NEGATIVE mg/dL   Nitrite POSITIVE (A) NEGATIVE   Leukocytes,Ua MODERATE (A) NEGATIVE    Comment: Performed at [facility] [address], [place], [place] [address]  Urinalysis, Microscopic (reflex)     Status: Abnormal   Collection Time: 04/20/19  8:31 PM  Result Value Ref Range   RBC / HPF 6-10 0 - 5 RBC/hpf   WBC, UA 11-20 0 - 5 WBC/hpf   Bacteria, UA MANY (A) NONE SEEN   Squamous Epithelial / LPF 6-10 0 - 5    Comment: Performed at [facility], [place] [address] [place], [place] [address]   Dg Chest Port 1 View  Result Date: 04/20/2019 CLINICAL DATA:  Respiratory distress EXAM: PORTABLE CHEST 1 VIEW COMPARISON:  September 02, 2018 FINDINGS: The patient is rotated which limits evaluation. There airspace opacities in the right middle lobe and at the left lung  base. No pneumothorax. Pleuroparenchymal scarring is noted at the lung apices. The heart size is stable. Aortic calcifications are again noted. There is no acute osseous abnormality. No pneumothorax. IMPRESSION: Probable opacities involving the left lower lobe and right middle lobe concerning for pneumonia,  aspiration, or atelectasis. Electronically Signed   By: Constance Holster M.D.   On: 04/20/2019 19:49    Pending Labs Unresulted Labs (From admission, onward)    Start     Ordered   04/20/19 1911  Lactic acid, plasma  Now then every 2 hours,   STAT     04/20/19 1911   04/20/19 1911  Blood Culture (routine x 2)  BLOOD CULTURE X 2,   STAT     04/20/19 1911   04/20/19 1911  Urine culture  ONCE - STAT,   STAT     04/20/19 1911   Signed and Held  Lactic acid, plasma  Once-Timed,   STAT     Signed and Held   Signed and Held  Culture, sputum-assessment  (Severe pneumonia (requires ICU care) in adults without resistant organism risk factors )  Once,   STAT     Signed and Held   Signed and Held  Legionella Urine Antigen  (Severe pneumonia (requires ICU care) in adults without resistant organism risk factors )  Once,   STAT     Signed and Held   Signed and Held  Strep pneumoniae urinary antigen  (Severe pneumonia (requires ICU care) in adults without resistant organism risk factors )  Once,   STAT     Signed and Held   Signed and Held  MRSA PCR Screening  Once,   R     Signed and Held   Signed and Held  Magnesium  Tomorrow morning,   R     Signed and Held          Vitals/Pain Today's Vitals   04/20/19 2045 04/20/19 2100 04/20/19 2113 04/20/19 2115  BP: 110/64 108/66  104/63  Pulse:  96  97  Resp: (!) 43 (!) 40  (!) 38  Temp:   100.2 F (37.9 C)   TempSrc:   Rectal   SpO2: (!) 80% 99%  96%  Weight:      Height:      PainSc:        Isolation Precautions No active isolations  Medications Medications  0.9 %  sodium chloride infusion (1,000 mLs Intravenous New Bag/Given 04/20/19 1951)  vancomycin (VANCOCIN) 1,750 mg in sodium chloride 0.9 % 500 mL IVPB (1,750 mg Intravenous New Bag/Given 04/20/19 2107)  sodium chloride 0.9 % bolus 1,000 mL (has no administration in time range)  piperacillin-tazobactam (ZOSYN) IVPB 3.375 g (has no administration in time range)  vancomycin  (VANCOCIN) 1,250 mg in sodium chloride 0.9 % 250 mL IVPB (has no administration in time range)  piperacillin-tazobactam (ZOSYN) IVPB 3.375 g (0 g Intravenous Stopped 04/20/19 2127)    Mobility non-ambulatory High fall risk   Focused Assessments Cardiac Assessment Handoff:    No results found for: CKTOTAL, CKMB, CKMBINDEX, TROPONINI No results found for: DDIMER Does the Patient currently have chest pain? No      R Recommendations: See Admitting Provider Note  Report given to:   Additional Notes:

## 2019-04-20 NOTE — ED Triage Notes (Addendum)
[facility] after acute onset of respiratory distress. Pt already on CPAP when ems arrived with spo2 95-96%, unknown spo2 prior to ems arrival. Pt currently 100% on nonrebreather at 15lpm. Pt initially had a temp of 103.8 temporal per ems, however, 99.35F on arrival to [facility]. Per EMS, nursing facility did administer unknown dose of tylenol at an unknown time. Pt received 0.3 epi, 125mg  solumedrol, and 2g magnesium by EMS. BP 134/64, HR 131 RR 36 with labored breathing.

## 2019-04-21 ENCOUNTER — Inpatient Hospital Stay (HOSPITAL_COMMUNITY): Payer: Medicare Other

## 2019-04-21 DIAGNOSIS — J189 Pneumonia, unspecified organism: Secondary | ICD-10-CM

## 2019-04-21 DIAGNOSIS — J9601 Acute respiratory failure with hypoxia: Secondary | ICD-10-CM

## 2019-04-21 DIAGNOSIS — I214 Non-ST elevation (NSTEMI) myocardial infarction: Secondary | ICD-10-CM

## 2019-04-21 DIAGNOSIS — A419 Sepsis, unspecified organism: Secondary | ICD-10-CM

## 2019-04-21 DIAGNOSIS — R9431 Abnormal electrocardiogram [ECG] [EKG]: Secondary | ICD-10-CM

## 2019-04-21 DIAGNOSIS — I959 Hypotension, unspecified: Secondary | ICD-10-CM

## 2019-04-21 LAB — GLUCOSE, CAPILLARY
Glucose-Capillary: 128 mg/dL — ABNORMAL HIGH (ref 70–99)
Glucose-Capillary: 129 mg/dL — ABNORMAL HIGH (ref 70–99)
Glucose-Capillary: 136 mg/dL — ABNORMAL HIGH (ref 70–99)
Glucose-Capillary: 149 mg/dL — ABNORMAL HIGH (ref 70–99)
Glucose-Capillary: 166 mg/dL — ABNORMAL HIGH (ref 70–99)
Glucose-Capillary: 188 mg/dL — ABNORMAL HIGH (ref 70–99)

## 2019-04-21 LAB — BLOOD CULTURE ID PANEL (REFLEXED)

## 2019-04-21 LAB — HEPARIN LEVEL (UNFRACTIONATED): Heparin Unfractionated: 0.27 IU/mL — ABNORMAL LOW (ref 0.30–0.70)

## 2019-04-21 LAB — ECHOCARDIOGRAM COMPLETE
Height: 74 in
Weight: 2910.07 oz

## 2019-04-21 LAB — MAGNESIUM: Magnesium: 2.5 mg/dL — ABNORMAL HIGH (ref 1.7–2.4)

## 2019-04-21 LAB — COMPREHENSIVE METABOLIC PANEL
ALT: 17 U/L (ref 0–44)
AST: 45 U/L — ABNORMAL HIGH (ref 15–41)
Albumin: 2.4 g/dL — ABNORMAL LOW (ref 3.5–5.0)
Alkaline Phosphatase: 67 U/L (ref 38–126)
Anion gap: 10 (ref 5–15)
BUN: 24 mg/dL — ABNORMAL HIGH (ref 8–23)
CO2: 19 mmol/L — ABNORMAL LOW (ref 22–32)
Calcium: 8.6 mg/dL — ABNORMAL LOW (ref 8.9–10.3)
Chloride: 109 mmol/L (ref 98–111)
Creatinine, Ser: 1.66 mg/dL — ABNORMAL HIGH (ref 0.61–1.24)
GFR calc Af Amer: 46 mL/min — ABNORMAL LOW (ref >60)
GFR calc non Af Amer: 39 mL/min — ABNORMAL LOW (ref >60)
Glucose, Bld: 175 mg/dL — ABNORMAL HIGH (ref 70–99)
Potassium: 4.5 mmol/L (ref 3.5–5.1)
Sodium: 138 mmol/L (ref 135–145)
Total Bilirubin: 0.5 mg/dL (ref 0.3–1.2)
Total Protein: 7.4 g/dL (ref 6.5–8.1)

## 2019-04-21 LAB — TROPONIN I (HIGH SENSITIVITY)
Troponin I (High Sensitivity): 3566 ng/L (ref <18)
Troponin I (High Sensitivity): 4179 ng/L (ref <18)
Troponin I (High Sensitivity): 4027 ng/L (ref <18)
Troponin I (High Sensitivity): 4159 ng/L (ref <18)

## 2019-04-21 LAB — STREP PNEUMONIAE URINARY ANTIGEN: Strep Pneumo Urinary Antigen: NEGATIVE

## 2019-04-21 LAB — MRSA PCR SCREENING: MRSA by PCR: NEGATIVE

## 2019-04-21 LAB — LACTIC ACID, PLASMA: Lactic Acid, Venous: 1.2 mmol/L (ref 0.5–1.9)

## 2019-04-21 MED ORDER — ASPIRIN 300 MG RE SUPP
300.0000 mg | Freq: Once | RECTAL | Status: AC
  Administered 2019-04-21: 300 mg via RECTAL
  Filled 2019-04-21: qty 1

## 2019-04-21 MED ORDER — HEPARIN BOLUS VIA INFUSION
4000.0000 [IU] | Freq: Once | INTRAVENOUS | Status: AC
  Administered 2019-04-21: 4000 [IU] via INTRAVENOUS
  Filled 2019-04-21: qty 4000

## 2019-04-21 MED ORDER — SODIUM CHLORIDE 0.9 % IV SOLN
2.0000 g | INTRAVENOUS | Status: DC
  Administered 2019-04-21: 16:00:00 2 g via INTRAVENOUS
  Filled 2019-04-21: qty 2
  Filled 2019-04-21: qty 20

## 2019-04-21 MED ORDER — HEPARIN (PORCINE) 25000 UT/250ML-% IV SOLN
1600.0000 [IU]/h | INTRAVENOUS | Status: DC
  Administered 2019-04-21: 1000 [IU]/h via INTRAVENOUS
  Administered 2019-04-21: 1150 [IU]/h via INTRAVENOUS
  Administered 2019-04-22: 1450 [IU]/h via INTRAVENOUS
  Administered 2019-04-23 – 2019-04-27 (×5): 1600 [IU]/h via INTRAVENOUS
  Filled 2019-04-21 (×8): qty 250

## 2019-04-21 MED ORDER — SODIUM CHLORIDE 0.9 % IV SOLN
1000.0000 mL | INTRAVENOUS | Status: AC
  Administered 2019-04-21: 1000 mL via INTRAVENOUS

## 2019-04-21 MED ORDER — METOPROLOL TARTRATE 12.5 MG HALF TABLET
12.5000 mg | ORAL_TABLET | Freq: Two times a day (BID) | ORAL | Status: DC
  Administered 2019-04-21 – 2019-04-22 (×3): 12.5 mg via ORAL
  Filled 2019-04-21 (×3): qty 1

## 2019-04-21 MED ORDER — PIPERACILLIN-TAZOBACTAM 3.375 G IVPB
3.3750 g | Freq: Three times a day (TID) | INTRAVENOUS | Status: DC
  Filled 2019-04-21 (×2): qty 50

## 2019-04-21 MED ORDER — ASPIRIN 325 MG PO TABS: 325.0000 mg | ORAL_TABLET | Freq: Once | ORAL | Status: DC

## 2019-04-21 MED ORDER — ASPIRIN EC 81 MG PO TBEC
81.0000 mg | DELAYED_RELEASE_TABLET | Freq: Every day | ORAL | Status: DC
  Administered 2019-04-21 – 2019-04-22 (×2): 81 mg via ORAL
  Filled 2019-04-21 (×2): qty 1

## 2019-04-21 MED ORDER — ATORVASTATIN CALCIUM 80 MG PO TABS
80.0000 mg | ORAL_TABLET | Freq: Every day | ORAL | Status: DC
  Administered 2019-04-21 – 2019-04-28 (×8): 80 mg via ORAL
  Filled 2019-04-21 (×8): qty 1

## 2019-04-21 NOTE — Progress Notes (Signed)
PHARMACY - PHYSICIAN COMMUNICATION CRITICAL VALUE ALERT - BLOOD CULTURE IDENTIFICATION (BCID)  Luis Lyons is an 78 y.o. male who presented to [facility] on 04/20/2019 with a chief complaint of SOB and fever and EKG showed he had a STEMI  Assessment:  Patient has 3 bottles positive for coagulase negative staph, mecA gene also detected. His CXR is concerning for potential pneumonia. Continue to follow the blood cultures for speciation to help see if this is a true bacteremia vs contaminant. Recommended to de-escalate the zosyn to ceftriaxone to continue to cover for potential PNA.  Name of physician (or Provider) Contacted: Dr. Nita Sells  Current antibiotics: Vancomycin and Zosyn  Changes to prescribed antibiotics recommended:  Recommendations accepted by provider   Plan: Continue Vancomycin Discontinue Zosyn Initiate Ceftriaxone   Results for orders placed or performed during the hospital encounter of 04/20/19  Blood Culture ID Panel (Reflexed) (Collected: 04/20/2019  7:20 PM)  Result Value Ref Range   Enterococcus species NOT DETECTED NOT DETECTED   Listeria monocytogenes NOT DETECTED NOT DETECTED   Staphylococcus species DETECTED (A) NOT DETECTED   Staphylococcus aureus (BCID) NOT DETECTED NOT DETECTED   Methicillin resistance DETECTED (A) NOT DETECTED   Streptococcus species NOT DETECTED NOT DETECTED   Streptococcus agalactiae NOT DETECTED NOT DETECTED   Streptococcus pneumoniae NOT DETECTED NOT DETECTED   Streptococcus pyogenes NOT DETECTED NOT DETECTED   Acinetobacter baumannii NOT DETECTED NOT DETECTED   Enterobacteriaceae species NOT DETECTED NOT DETECTED   Enterobacter cloacae complex NOT DETECTED NOT DETECTED   Escherichia coli NOT DETECTED NOT DETECTED   Klebsiella oxytoca NOT DETECTED NOT DETECTED   Klebsiella pneumoniae NOT DETECTED NOT DETECTED   Proteus species NOT DETECTED NOT DETECTED   Serratia marcescens NOT DETECTED NOT DETECTED   Haemophilus  influenzae NOT DETECTED NOT DETECTED   Neisseria meningitidis NOT DETECTED NOT DETECTED   Pseudomonas aeruginosa NOT DETECTED NOT DETECTED   Candida albicans NOT DETECTED NOT DETECTED   Candida glabrata NOT DETECTED NOT DETECTED   Candida krusei NOT DETECTED NOT DETECTED   Candida parapsilosis NOT DETECTED NOT DETECTED   Candida tropicalis NOT DETECTED NOT DETECTED    Phillis Haggis 04/21/2019  2:16 PM

## 2019-04-21 NOTE — Progress Notes (Signed)
Spoke with nursing staff at [facility] this am regarding patient's baseline.  Nursing stated that patient is Alert and Oriented x 4 at baseline with dysphagia and dysarthria.  Pt has chronic global weakness and is w/c bound, able to stand and pivot with assistance.

## 2019-04-21 NOTE — Progress Notes (Signed)
Code STEMI called per Kennon Holter, NP.

## 2019-04-21 NOTE — Consult Note (Signed)
Cardiology Consultation:   Patient ID: [name] MRN: [id number]; DOB: May 21, 1941  Admit date: 04/20/2019 Date of Consult: 04/21/2019  Primary Care Provider: Elson Clan, MD Primary Cardiologist: No primary care provider on file.  Primary Electrophysiologist:  None    Patient Profile:   Luis Lyons is a 78 y.o. former smoker and nursing home resident with a history of HTN, CVA with residual left sided weakness, who presented with fever and hypoxic respiratory failure in the setting of presumed bacterial pneumonia. Routine EKG this morning revealed anterolateral ST elevations for which cardiology is consulted.   History of Present Illness:   Briefly, patient was brought in from his nursing home in respiratory distress. EMS placed the patient on CPAP en route and he required a NRB in the [facility]. He was febrile to 103.8 with EMS and 99.1 upon arrival. Patient was near obtunded on arrival. He was noted to be tachycardic to the 130s. EKG with ST to 130, LAFB, poor R wave progression. Initial CXR was concerning for multifocal PNA thought COVID swab was negative. Labs revealed a leukocytosis to 22, lactate 2.1. No cardiac enzymes or BNP drawn at the time and patient was admitted to the hospitalist service.   Per RN, patient's mental status and respiratory status improved gradually throughout the night with the administration of fluids and antibiotics.   This morning, patient had a routine EKG performed to re-evaluate QT interval. It was notable for 2-3 mm ST elevations in the anterolateral leads (V3-V6) with subtle reciprocal changes inferiorly. A repeat EKG demonstrated interval improvement in STE with new Q waves in V3. A CODE STEMI was called.   Upon my arrival, patient was laying comfortably in bed, breathing easily on [name]. He was normotensive, not tachycardic. He denied active or antecedent chest pain, tightness, or pressure. He was unaware of any previous heart problems. He stated that his  breathing was "normal" and much better than it had been yesterday.   The clinical situation and EKG were reviewed with Dr. Gwenlyn Found, who favored medical management for his acute anterolateral MI.    Past Medical History:  Diagnosis Date  . Abdominal hernia   . Cancer ([place])   . Constipation   . Cough   . Depression   . Dry mouth   . Hard of hearing   . Hypertension   . Muscle weakness   . Stroke ([place])   . Urinary incontinence   . Vitamin D deficiency   . Vomiting   . Wheezing     Past Surgical History:  Procedure Laterality Date  . ABDOMINAL SURGERY    . LAPAROTOMY N/A 09/05/2018   Procedure: EXPLORATORY LAPAROTOMY;  Surgeon: Georganna Skeans, MD;  Location: [facility];  Service: General;  Laterality: N/A;     Home Medications:  Prior to Admission medications   Medication Sig Start Date End Date Taking? Authorizing Provider  acetaminophen (TYLENOL) 500 MG tablet Take 500 mg by mouth every 4 (four) hours as needed for mild pain.    [provider]  albuterol (PROVENTIL) (2.5 MG/3ML) 0.083% nebulizer solution Take 2.5 mg by nebulization 2 (two) times daily as needed for wheezing or shortness of breath.    [provider]  aspirin 81 MG chewable tablet Chew 81 mg by mouth daily.    [provider]  chlorhexidine (PERIDEX) 0.12 % solution 15 mLs by Mouth Rinse route daily.    [provider]  cholecalciferol (VITAMIN D3) 25 MCG (1000 UT) tablet Take 1,000 Units by  mouth daily.    [provider]  dextromethorphan (DELSYM) 30 MG/5ML liquid Take 60 mg by mouth 2 (two) times daily as needed for cough.    [provider]  fluticasone (FLONASE) 50 MCG/ACT nasal spray Place 1 spray into both nostrils daily.    [provider]  HYDROcodone-acetaminophen (NORCO/VICODIN) 5-325 MG tablet Take 1 tablet by mouth every 6 (six) hours as needed for severe pain. 09/11/18   Thurnell Lose, MD  ibuprofen (ADVIL,MOTRIN) 200 MG tablet Take 400  mg by mouth every 6 (six) hours as needed for moderate pain.    [provider]  ipratropium-albuterol (DUONEB) 0.5-2.5 (3) MG/3ML SOLN Take 3 mLs by nebulization 4 (four) times daily.     [provider]  Lactobacillus (ACIDOPHILUS) CAPS capsule Take 2 capsules by mouth 2 (two) times daily.    [provider]  magnesium hydroxide (MILK OF MAGNESIA) 400 MG/5ML suspension Take 30 mLs by mouth daily as needed for mild constipation.    [provider]  oxybutynin (DITROPAN-XL) 5 MG 24 hr tablet Take 5 mg by mouth at bedtime.    [provider]  phenazopyridine (PYRIDIUM) 200 MG tablet Take 200 mg by mouth 3 (three) times daily as needed for pain.    [provider]  polyethylene glycol (MIRALAX / GLYCOLAX) packet Take 17 g by mouth daily.    [provider]  polyvinyl alcohol (LIQUIFILM TEARS) 1.4 % ophthalmic solution Place 1 drop into both eyes 2 (two) times daily.    [provider]  senna (SENOKOT) 8.6 MG tablet Take 1 tablet by mouth 2 (two) times daily as needed for constipation.    [provider]  sodium fluoride (DENTAGEL) 1.1 % GEL dental gel Place 1 application onto teeth 2 (two) times daily.    [provider]  vitamin B-12 (CYANOCOBALAMIN) 1000 MCG tablet Take 1,000 mcg by mouth daily.    [provider]  zinc oxide 20 % ointment Apply 1 application topically 2 (two) times daily as needed for irritation.    [provider]    Inpatient Medications: Scheduled Meds: . heparin  5,000 Units Subcutaneous Q8H  . insulin aspart  0-9 Units Subcutaneous Q4H  . sodium chloride flush  3 mL Intravenous Q12H   Continuous Infusions: . sodium chloride 1,000 mL (04/21/19 0305)  . heparin 1,000 Units/hr (04/21/19 0540)  . vancomycin     PRN Meds: acetaminophen **OR** acetaminophen, polyethylene glycol  Allergies:   No Known Allergies  Social History:   Social History   Socioeconomic  History  . Marital status: Widowed    Spouse name: Not on file  . Number of children: Not on file  . Years of education: Not on file  . Highest education level: Not on file  Occupational History  . Not on file  Social Needs  . Financial resource strain: Not on file  . Food insecurity    Worry: Not on file    Inability: Not on file  . Transportation needs    Medical: Not on file    Non-medical: Not on file  Tobacco Use  . Smoking status: Former Research scientist (life sciences)  . Smokeless tobacco: Never Used  Substance and Sexual Activity  . Alcohol use: Not Currently  . Drug use: Never  . Sexual activity: Not on file  Lifestyle  . Physical activity    Days per week: Not on file    Minutes per session: Not on file  . Stress: Not  on file  Relationships  . Social Herbalist on phone: Not on file    Gets together: Not on file    Attends religious service: Not on file    Active member of club or organization: Not on file    Attends meetings of clubs or organizations: Not on file    Relationship status: Not on file  . Intimate partner violence    Fear of current or ex partner: Not on file    Emotionally abused: Not on file    Physically abused: Not on file    Forced sexual activity: Not on file  Other Topics Concern  . Not on file  Social History Narrative  . Not on file    Family History:    Family History  Family history unknown: Yes     Review of Systems: [y] = yes, [ ]  = no     General: Weight gain [ ] ; Weight loss [ ] ; Anorexia [ ] ; Fatigue [ ] ; Fever [ ] ; Chills [ ] ; Weakness [ ]    Cardiac: Chest pain/pressure [ ] ; Resting SOB [ ] ; Exertional SOB [ ] ; Orthopnea [ ] ; Pedal Edema [ ] ; Palpitations [ ] ; Syncope [ ] ; Presyncope [ ] ; Paroxysmal nocturnal dyspnea[ ]    Pulmonary: Cough [ ] ; Wheezing[ ] ; Hemoptysis[ ] ; Sputum [ ] ; Snoring [ ]    GI: Vomiting[ ] ; Dysphagia[ ] ; Melena[ ] ; Hematochezia [ ] ; Heartburn[ ] ; Abdominal pain [ ] ; Constipation [ ] ; Diarrhea [ ] ; BRBPR [ ]     GU: Hematuria[ ] ; Dysuria [ ] ; Nocturia[ ]    Vascular: Pain in legs with walking [ ] ; Pain in feet with lying flat [ ] ; Non-healing sores [ ] ; Stroke [ ] ; TIA [ ] ; Slurred speech [ ] ;   Neuro: Headaches[ ] ; Vertigo[ ] ; Seizures[ ] ; Paresthesias[ ] ;Blurred vision [ ] ; Diplopia [ ] ; Vision changes [ ]    Ortho/Skin: Arthritis [ ] ; Joint pain [ ] ; Muscle pain [ ] ; Joint swelling [ ] ; Back Pain [ ] ; Rash [ ]    Psych: Depression[ ] ; Anxiety[ ]    Heme: Bleeding problems [ ] ; Clotting disorders [ ] ; Anemia [ ]    Endocrine: Diabetes [ ] ; Thyroid dysfunction[ ]   Physical Exam/Data:   Vitals:   04/20/19 2200 04/20/19 2215 04/20/19 2303 04/21/19 0412  BP: 91/60 (!) 92/57 103/69 124/78  Pulse: 88 86 83 88  Resp: (!) 38 (!) 38 (!) 33 (!) 26  Temp:   97.6 F (36.4 C) (!) 97.4 F (36.3 C)  TempSrc:   Axillary Axillary  SpO2: 99% 99% 100% 100%  Weight:    82.5 kg  Height:        Intake/Output Summary (Last 24 hours) at 04/21/2019 0605 Last data filed at 04/21/2019 0400 Gross per 24 hour  Intake 925 ml  Output -  Net 925 ml   Filed Weights   04/20/19 1953 04/21/19 0412  Weight: 90.7 kg 82.5 kg   Body mass index is 23.35 kg/m.  General:  Thin gentleman, resting comfortably in bed on [place].  HEENT: normal Lymph: no adenopathy Neck: no JVD Endocrine:  No thryomegaly Vascular: No carotid bruits; FA pulses 2+ bilaterally without bruits  Cardiac:  normal S1, S2; RRR; no murmur. Distant heart sounds.  Lungs:  Scattered rhonchi in lower lung fields bilaterally.  Abd: soft, nontender, no hepatomegaly  Ext: no edema Musculoskeletal:  Grossly intact.  Skin: warm and dry  Neuro:  Notably dysarthric (baseline) Psych:  Normal affect  EKG:  The EKG was personally reviewed and demonstrates sinus rhythm with STE in V3 - V6 with subtle reciprocal changes inferiorly consistent with acute anterolateral MI.  Telemetry:  Telemetry was personally reviewed and demonstrates. SR occasional SVT. Four  beats of NSVT.   Relevant CV Studies: none  Laboratory Data:  Chemistry Recent Labs  Lab 04/20/19 1911  NA 135  K 3.4*  CL 103  CO2 19*  GLUCOSE 235*  BUN 24*  CREATININE 1.69*  CALCIUM 9.2  GFRNONAA 39*  GFRAA 45*  ANIONGAP 13    Recent Labs  Lab 04/20/19 1911  PROT 8.2*  ALBUMIN 2.8*  AST 26  ALT 15  ALKPHOS 86  BILITOT 1.2   Hematology Recent Labs  Lab 04/20/19 1911  WBC 22.8*  RBC 4.32  HGB 11.7*  HCT 37.9*  MCV 87.7  MCH 27.1  MCHC 30.9  RDW 13.7  PLT 321   Cardiac EnzymesNo results for input(s): TROPONINI in the last 168 hours. No results for input(s): TROPIPOC in the last 168 hours.  BNPNo results for input(s): BNP, PROBNP in the last 168 hours.  DDimer No results for input(s): DDIMER in the last 168 hours.  Radiology/Studies:  Dg Chest Port 1 View  Result Date: 04/20/2019 CLINICAL DATA:  Respiratory distress EXAM: PORTABLE CHEST 1 VIEW COMPARISON:  September 02, 2018 FINDINGS: The patient is rotated which limits evaluation. There airspace opacities in the right middle lobe and at the left lung base. No pneumothorax. Pleuroparenchymal scarring is noted at the lung apices. The heart size is stable. Aortic calcifications are again noted. There is no acute osseous abnormality. No pneumothorax. IMPRESSION: Probable opacities involving the left lower lobe and right middle lobe concerning for pneumonia, aspiration, or atelectasis. Electronically Signed   By: Constance Holster M.D.   On: 04/20/2019 19:49    Assessment and Plan:   Mr. Yoo is a 78 year old gentleman who presented with acute hypoxic respiratory failure, fever, and hypotension that is concerning for sepsis from a pulmonary source. While his initial EKG was notable only for (likely sinus) tachycardia, a repeat EKG this morning demonstrated new significant ST elevations concerning for an evolving, acute anterolateral MI. The patient reports absolutely no chest pain this morning and does not  recall having chest pain prior to admission. He has clinically improved with therapy for sepsis / infection and is hemodynamically stable.   His EKG and clinical situation were reviewed with Dr. Gwenlyn Found (interventionalist on call) who felt that given his comorbidities medical conditions, clinical improvement, and lack of symptoms, the risks of urgent / emergent catheterization outweigh the benefits. Thus, medical therapy for CAD will be recommended at this time. Patient has already been given a heparin bolus and started on a heparin drip in addition to 325 mg of aspirin.   Recommendations: -- Would investigate whether patient's dysarthria was present prior to admission or represents a new neurologic deficit. The only documentation I can find is a Care Everywhere note from 2014 noting a prior stroke that can caused some slurred speech. If new, would recommend CT head to better evaluate and guide our antiplatelet / anticoagulation strategy.  -- Would continue ASA, heparin for now.  -- Please order hemoglobin a1c and lipid profile to risk stratify  -- Complete echocardiogram -- Continue telemetry monitoring.   Cardiology will continue to follow.   For questions or updates, please contact [facility] Please consult [email or web address] for contact info under   Signed, Milus Banister, MD  04/21/2019 6:05 AM

## 2019-04-21 NOTE — Significant Event (Addendum)
Rapid Response Event Note  Overview:Called d/t AM EKG showing STEMI Time Called: 0512 Arrival Time: 0515 Event Type: Cardiac  Initial Focused Assessment: Pt laying in bed, alert and oriented, c/o no chest pain or SOB. T-97.4, HR-88, BP124/78, RR-26, 100% on 2.5L [place]. EKG showing STE in anterolateral leads(this was confirmed by 2nd EKG as well). Prior EKG was yesterday when he was in an atrial tachycardic.  Interventions: Code STEMI called @ 0547 after speaking with Kennon Holter, NP who spoke with cardiology. Dr. Marletta Lor to bedside-code STEMI cancelled, will medically manage. Plan of Care (if not transferred): Await cardiology orders. Okay to stay in [facility] per cardiology. Continue to monitor pt. Call RRT if further assistance needed. Event Summary: Name of Physician Notified: Kennon Holter, NP at (PTA RRT)  Name of Consulting Physician Notified: Dr. Marletta Lor, cardiology at [phone]  Outcome: Stayed in room and stabalized  Event End Time: 0630  Dillard Essex

## 2019-04-21 NOTE — Progress Notes (Addendum)
[facility] for Heparin Indication: chest pain/ACS  Allergies  Allergen Reactions  . Bactrim [Sulfamethoxazole-Trimethoprim] Other (See Comments)    Listed on [facility]    Patient Measurements: Height: 6\' 2"  (188 cm) Weight: 181 lb 14.1 oz (82.5 kg) IBW/kg (Calculated) : 82.2  Vital Signs: Temp: 97.4 F (36.3 C) (07/19 0412) Temp Source: Axillary (07/19 0412) BP: 124/78 (07/19 0412) Pulse Rate: 88 (07/19 0412)  Labs: Recent Labs    04/20/19 1911 04/21/19 0609 04/21/19 0934 04/21/19 1159  HGB 11.7*  --   --   --   HCT 37.9*  --   --   --   PLT 321  --   --   --   LABPROT 15.1  --   --   --   INR 1.2  --   --   --   HEPARINUNFRC  --   --   --  0.27*  CREATININE 1.69* 1.66*  --   --   TROPONINIHS  --  3,566* 4,179* 4,027*    Estimated Creatinine Clearance: 44 mL/min (A) (by C-G formula based on SCr of 1.66 mg/dL (H)).   Medical History: Past Medical History:  Diagnosis Date  . Abdominal hernia   . Cancer ([place])   . Constipation   . Cough   . Depression   . Dry mouth   . Hard of hearing   . Hypertension   . Muscle weakness   . Stroke ([place])   . Urinary incontinence   . Vitamin D deficiency   . Vomiting   . Wheezing     Medications:  Medications Prior to Admission  Medication Sig Dispense Refill Last Dose  . acetaminophen (TYLENOL) 325 MG tablet Take 650 mg by mouth every 4 (four) hours as needed (temp 100.4 or greater).    unk  . albuterol (PROVENTIL) (2.5 MG/3ML) 0.083% nebulizer solution Take 2.5 mg by nebulization 2 (two) times daily as needed for wheezing or shortness of breath.   11/04/2018  . aspirin 81 MG chewable tablet Chew 81 mg by mouth daily.   04/20/2019 at Unknown time  . chlorhexidine (PERIDEX) 0.12 % solution 15 mLs by Mouth Rinse route 2 (two) times a day.    04/19/2019  . cholecalciferol (VITAMIN D3) 25 MCG (1000 UT) tablet Take 1,000 Units by mouth daily.   04/20/2019 at Unknown time  . coal tar (NEUTROGENA  T-GEL) 0.5 % shampoo Apply 1 application topically 2 (two) times a week. Tuesday and Friday   04/19/2019  . dextromethorphan (DELSYM) 30 MG/5ML liquid Take 60 mg by mouth 2 (two) times daily as needed for cough.   12/29/2018  . docusate sodium (COLACE) 100 MG capsule Take 200 mg by mouth daily.   04/20/2019 at Unknown time  . fluticasone (FLONASE) 50 MCG/ACT nasal spray Place 1 spray into both nostrils daily.   04/20/2019 at Unknown time  . HYDROcodone-acetaminophen (NORCO/VICODIN) 5-325 MG tablet Take 1 tablet by mouth every 6 (six) hours as needed for severe pain. 10 tablet 0 04/19/2019  . ibuprofen (ADVIL,MOTRIN) 200 MG tablet Take 400 mg by mouth every 6 (six) hours as needed for moderate pain.   03/08/2019  . ipratropium-albuterol (DUONEB) 0.5-2.5 (3) MG/3ML SOLN Take 3 mLs by nebulization 4 (four) times daily.    04/20/2019 at Unknown time  . magnesium hydroxide (MILK OF MAGNESIA) 400 MG/5ML suspension Take 30 mLs by mouth daily as needed for mild constipation.   03/22/2019  . oxybutynin (DITROPAN-XL) 5 MG  24 hr tablet Take 5 mg by mouth at bedtime.   04/19/2019  . phenazopyridine (PYRIDIUM) 200 MG tablet Take 200 mg by mouth 3 (three) times daily as needed for pain.   04/20/2019 at Unknown time  . polyethylene glycol (MIRALAX / GLYCOLAX) packet Take 17 g by mouth daily.   04/20/2019 at Unknown time  . polyvinyl alcohol (LIQUIFILM TEARS) 1.4 % ophthalmic solution Place 1 drop into both eyes 2 (two) times daily.   04/20/2019 at Unknown time  . Probiotic Product (RISA-BID PROBIOTIC PO) Take 2 tablets by mouth 2 (two) times a day.   04/20/2019 at Unknown time  . senna (SENOKOT) 8.6 MG tablet Take 1 tablet by mouth 2 (two) times daily as needed for constipation.   10/04/2018  . senna (SENOKOT) 8.6 MG tablet Take 2 tablets by mouth at bedtime.   04/19/2019  . Simethicone 180 MG CAPS Take 180 mg by mouth 2 (two) times daily as needed (gas).   unk  . sodium fluoride (DENTAGEL) 1.1 % GEL dental gel Place 1 application  onto teeth 2 (two) times daily.   04/20/2019 at Unknown time  . vitamin B-12 (CYANOCOBALAMIN) 1000 MCG tablet Take 1,000 mcg by mouth daily.   04/20/2019 at Unknown time  . zinc oxide 20 % ointment Apply 1 application topically 2 (two) times daily as needed (scrotum for redness).    unk  . zinc sulfate 220 (50 Zn) MG capsule Take 220 mg by mouth daily.   04/20/2019 at Unknown time    Assessment: 78 y.o. male presenting with SOB and fever, now EKG changes showing STEMI. Now on heparin with initial heparin level at 0.27 - subtherapeutic  Goal of Therapy:  Heparin level 0.3-0.7 units/ml Monitor platelets by anticoagulation protocol: Yes   Plan:  -Increase to Heparin 1150 units/hr -Daily HL and CBC -Monitor s/s of bleeding   Lorel Monaco, PharmD PGY1 Ambulatory Care Resident [facility] # [phone]  I discussed / reviewed the pharmacy note by Dr. Sudie Bailey and I agree with the resident's findings and plans as documented.  Thank you Anette Guarneri, PharmD

## 2019-04-21 NOTE — Progress Notes (Signed)
fully updated daughter on phone--she expressed appreciation Discussed likely need for Palliative involvement in OP setting on d/c and she concurs

## 2019-04-21 NOTE — Progress Notes (Signed)
Pt am EKG showing STEMI, Pt denies CP or any other complaints. RRT notified awaiting return call from [facility] NP.

## 2019-04-21 NOTE — Progress Notes (Signed)
ANTICOAGULATION CONSULT NOTE - Initial Consult  Pharmacy Consult for Heparin Indication: chest pain/ACS  No Known Allergies  Patient Measurements: Height: 6\' 2"  (188 cm) Weight: 181 lb 14.1 oz (82.5 kg) IBW/kg (Calculated) : 82.2  Vital Signs: Temp: 97.4 F (36.3 C) (07/19 0412) Temp Source: Axillary (07/19 0412) BP: 124/78 (07/19 0412) Pulse Rate: 88 (07/19 0412)  Labs: Recent Labs    04/20/19 1911  HGB 11.7*  HCT 37.9*  PLT 321  LABPROT 15.1  INR 1.2  CREATININE 1.69*    Estimated Creatinine Clearance: 43.2 mL/min (A) (by C-G formula based on SCr of 1.69 mg/dL (H)).   Medical History: Past Medical History:  Diagnosis Date  . Abdominal hernia   . Cancer ([place])   . Constipation   . Cough   . Depression   . Dry mouth   . Hard of hearing   . Hypertension   . Muscle weakness   . Stroke ([place])   . Urinary incontinence   . Vitamin D deficiency   . Vomiting   . Wheezing     Medications:  Medications Prior to Admission  Medication Sig Dispense Refill Last Dose  . acetaminophen (TYLENOL) 500 MG tablet Take 500 mg by mouth every 4 (four) hours as needed for mild pain.     Marland Kitchen albuterol (PROVENTIL) (2.5 MG/3ML) 0.083% nebulizer solution Take 2.5 mg by nebulization 2 (two) times daily as needed for wheezing or shortness of breath.     Marland Kitchen aspirin 81 MG chewable tablet Chew 81 mg by mouth daily.     . chlorhexidine (PERIDEX) 0.12 % solution 15 mLs by Mouth Rinse route daily.     . cholecalciferol (VITAMIN D3) 25 MCG (1000 UT) tablet Take 1,000 Units by mouth daily.     Marland Kitchen dextromethorphan (DELSYM) 30 MG/5ML liquid Take 60 mg by mouth 2 (two) times daily as needed for cough.     . fluticasone (FLONASE) 50 MCG/ACT nasal spray Place 1 spray into both nostrils daily.     Marland Kitchen HYDROcodone-acetaminophen (NORCO/VICODIN) 5-325 MG tablet Take 1 tablet by mouth every 6 (six) hours as needed for severe pain. 10 tablet 0   . ibuprofen (ADVIL,MOTRIN) 200 MG tablet Take 400 mg by mouth  every 6 (six) hours as needed for moderate pain.     Marland Kitchen ipratropium-albuterol (DUONEB) 0.5-2.5 (3) MG/3ML SOLN Take 3 mLs by nebulization 4 (four) times daily.      . Lactobacillus (ACIDOPHILUS) CAPS capsule Take 2 capsules by mouth 2 (two) times daily.     . magnesium hydroxide (MILK OF MAGNESIA) 400 MG/5ML suspension Take 30 mLs by mouth daily as needed for mild constipation.     Marland Kitchen oxybutynin (DITROPAN-XL) 5 MG 24 hr tablet Take 5 mg by mouth at bedtime.     . phenazopyridine (PYRIDIUM) 200 MG tablet Take 200 mg by mouth 3 (three) times daily as needed for pain.     . polyethylene glycol (MIRALAX / GLYCOLAX) packet Take 17 g by mouth daily.     . polyvinyl alcohol (LIQUIFILM TEARS) 1.4 % ophthalmic solution Place 1 drop into both eyes 2 (two) times daily.     Marland Kitchen senna (SENOKOT) 8.6 MG tablet Take 1 tablet by mouth 2 (two) times daily as needed for constipation.     . sodium fluoride (DENTAGEL) 1.1 % GEL dental gel Place 1 application onto teeth 2 (two) times daily.     . vitamin B-12 (CYANOCOBALAMIN) 1000 MCG tablet Take 1,000 mcg by mouth daily.     Marland Kitchen  zinc oxide 20 % ointment Apply 1 application topically 2 (two) times daily as needed for irritation.       Assessment: 78 y.o. male with EKG changes, possible STEMI, for heparin Goal of Therapy:  Heparin level 0.3-0.7 units/ml Monitor platelets by anticoagulation protocol: Yes   Plan:  Heparin 4000 units IV bolus, then start heparin 1000 units/hr Check heparin level in 6 hours.  Caryl Pina 04/21/2019,5:19 AM

## 2019-04-21 NOTE — Progress Notes (Signed)
CRITICAL VALUE ALERT  Critical Value:  Troponin 3566  Date & Time Notied:  04/21/2019 4175  Provider Notified: Verlon Au, MD  Orders Received/Actions taken: pending

## 2019-04-21 NOTE — Progress Notes (Signed)
  Echocardiogram 2D Echocardiogram has been performed.  Luis Lyons 04/21/2019, 10:08 AM

## 2019-04-21 NOTE — Progress Notes (Signed)
Patient seen by fell at 6AM, follow up rounding note. 78 yo male history of HTN, CVA with residual left sided weakness, DNR status admitted with fever and hypoxic respiratory faiulre. Home temp up to 103, WBC 22. Being managed for sepsis and pneumonia.   This AM with routine EKG noted to have ST/T changes that were asymptomatic. Trop 3000.   From note case discussed with Dr Gwenlyn Found who recommended medical management at this time due to patient's combordities, lack of symptoms,and clinic improvement with management of sepsis.   Patient lives in nursing home, wheel chair bound. Requires assistance for most of his ADLs however reports he is able to feed himself. He endorses DNR status. We discussed if he would ever want invasive procedures and he seems somewhat unclear  Medical therapy with hep gtt, ASA, atorva 80mg , lopressor 12.5mg  bid. Likely ACE-I tomorrow pending hemodynamics. Prolonged QTc may be related to ischemia, keep K at 4 Mg at 2. He remains asymptomatic, hemodynamically and electrically stable.    Carlyle Dolly MD

## 2019-04-21 NOTE — Progress Notes (Signed)
SLP Cancellation Note  Patient Details Name: Luis Lyons MRN: [id number] DOB: 1940-10-12   Cancelled treatment:       Reason Eval/Treat Not Completed: Patient having procedure done in room and is not available. Will f/u as able.   Venita Sheffield [place]ell 04/21/2019, 9:46 AM  Pollyann Glen, M.A. [place] Acute Environmental education officer [phone] Office [phone]

## 2019-04-21 NOTE — Progress Notes (Signed)
PROGRESS NOTE  Luis Lyons AYT:[id number] DOB: [birth year] DOA: 04/20/2019 PCP: Elson Clan, MD  Brief History   78 year old Caucasian male skilled nursing home resident Prior CVA?  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 Fluids 125 cc/H Speech to be involved when patient can eventually eat Code STEMI called early morning 7/19 Patient asymptomatic however troponins above 3000 T wave elevation anteroseptal leads-currently on heparin-medical management per cardiology-discussed with Dr. Harl Bowie who will be seeing later on today Blood pressure is borderline-unclear if continues ACE/beta-blocker? Echo ordered and pending Prostate cancer status post prostatectomy- Sees [facility] Dr. Juanda Crumble Foley for strict I/os Prior CVA 2013 No overt focal deficits Squamous cell CA mouth status post mandibulectomy, risk for aspiration As above-outpatient follow-up [facility] oncology HTN See above discussion       DVT prophylaxis: IV heparin at this tim Code Status: DNR confirmed Family Communication: We will discuss with family later on today once plan is known Disposition Plan: Inpatient for next several days dependent on clinical course-pneumonia-STEMI   Verneita Griffes, MD [facility] 7:25 AM  04/21/2019, 7:25 AM  LOS: 1 day    Consultants  . Cardiology  Procedures  . Chest x-ray 1 view  Antibiotics  . Bank cefepime  Interval History/Subjective  Awake coherent slow speech but able to orient to place person Absolutely no chest pain at this time-thinks that he was diagnosed with a urinary infection at facility Does not complain of cough, fever   Objective   Vitals:  Vitals:   04/20/19 2303 04/21/19 0412  BP: 103/69 124/78  Pulse: 83 88  Resp: (!) 33 (!) 26  Temp: 97.6 F (36.4 C) (!) 97.4 F (36.3 C)  SpO2: 100% 100%    Exam: External ocular movements intact arcus senilis no pallor no icterus Poor dentition/edentulous-postsurgical changes to mid mandible S1-S2 no murmur Anterolaterally chest is clear Abdomen soft no rebound no guarding Urinary incontinence noted Power 5/5 raise hands above head can raise legs off of bed Extraocular movements intact     I have personally reviewed the following:   Today's Data  .   Lab Data  . Potassium 4.5 . BUN/creatinine down from 24/1 0.6-20 4/1.6 . Mild elevation AST 26-->45 . Troponin 3566  Micro Data  . Await Legionella  Imaging  . X-ray as above  Cardiology Data  . EKG shows ST-T wave changes V1 through these 3  Other Data  . None  Scheduled Meds: . heparin  5,000 Units Subcutaneous Q8H  . insulin aspart  0-9 Units Subcutaneous Q4H  . sodium chloride flush  3 mL Intravenous Q12H   Continuous Infusions: . sodium chloride    . heparin 1,000 Units/hr (04/21/19 0540)  . vancomycin      Principal Problem:   Sepsis due to pneumonia [facility]) Active Problems:   Acute respiratory failure with hypoxia (HCC)   History of CVA (cerebrovascular accident)   Hyperglycemia   Hypokalemia   Renal insufficiency   Prolonged  QT interval   LOS: 1 day   How to contact the [facility] Attending or Consulting provider [place] or covering provider during after hours [place], for this patient?  1. Check the care team in [facility] and look for a)  attending/consulting TRH provider listed and b) the [facility] team listed 2. Log into [email or web address] and use [place]'s universal password to access. If you do not have the password, please contact the hospital operator. 3. Locate the [facility] provider you are looking for under [facility] and page to a number that you can be directly reached. 4. If you still have difficulty reaching the provider, please page the [facility] (Director on Call) for the Hospitalists listed on amion for assistance.

## 2019-04-21 NOTE — Evaluation (Signed)
Clinical/Bedside Swallow Evaluation Patient Details  Name: Luis Lyons MRN: [id number] Date of Birth: November 19, 1940  Today's Date: 04/21/2019 Time: SLP Start Time (ACUTE ONLY): 1059 SLP Stop Time (ACUTE ONLY): 1126 SLP Time Calculation (min) (ACUTE ONLY): 27 min  Past Medical History:  Past Medical History:  Diagnosis Date  . Abdominal hernia   . Cancer ([place])   . Constipation   . Cough   . Depression   . Dry mouth   . Hard of hearing   . Hypertension   . Muscle weakness   . Stroke ([place])   . Urinary incontinence   . Vitamin D deficiency   . Vomiting   . Wheezing    Past Surgical History:  Past Surgical History:  Procedure Laterality Date  . ABDOMINAL SURGERY    . LAPAROTOMY N/A 09/05/2018   Procedure: EXPLORATORY LAPAROTOMY;  Surgeon: Georganna Skeans, MD;  Location: [place];  Service: General;  Laterality: N/A;   HPI:  Pt is a 78 yo male admitted from SNF with CXR concerning for PNA and/or aspiration. Code STEMI was called early morning on 7/19. He has a h/o oral cancer s/p mandibulectomy and XRT (2014), for which he had a PEG that was removed after tx. He has been consuming soft solids (missing bottom dentition) and thin liquids PTA. BSE in December 2019 recommended regular solids and thin liquids with pt managing chronic dysphagia seemingly well. PMH also includes: prostate ca, CVA 2013, HTN, dry mouth, self-reported GER   Assessment / Plan / Recommendation Clinical Impression  Pt has a h/o dysphagia from prior surgery and XRT for oral cancer, but he has maintained a regular solid diet and thin liquids at SNF, primarily opting for softer foods so that he can masticate them well given his limited dentition. He also attributes this to xerostomia and need for frequent liquid washes. Pt had coughing that followed his initial sips of water, also stating that this is not a normal occurence. SLP provided additional repositioning with use of reverse Trendelenburg, with no further coughing  noted. Although he has had XRT, he has hyolaryngeal movement noted upon palpation as he swallows with multiple swallows occasionally noted with thin liquids, although outwardly appearing to be related to piecemeal swallowing. Pt self-managed solids by taking his time and softening solids as needed. Pt says that he had vomiting at SNF PTA, which could have contributed to his CXR findings. Recommend continuing soft diet and thin liquids with strict use of aspiration and esophageal precautions. Should he continue to have coughing associated with PO intake, threshhold would be low to proceed with MBS.  SLP Visit Diagnosis: Dysphagia, unspecified (R13.10)    Aspiration Risk  Mild aspiration risk;Moderate aspiration risk    Diet Recommendation Dysphagia 3 (Mech soft);Thin liquid   Liquid Administration via: Straw Medication Administration: Whole meds with puree Supervision: Staff to assist with self feeding Compensations: Slow rate;Small sips/bites;Follow solids with liquid Postural Changes: Seated upright at 90 degrees;Remain upright for at least 30 minutes after po intake    Other  Recommendations Oral Care Recommendations: Oral care BID   Follow up Recommendations Skilled Nursing facility      Frequency and Duration min 2x/week  2 weeks       Prognosis Prognosis for Safe Diet Advancement: Good      Swallow Study   General HPI: Pt is a 78 yo male admitted from SNF with CXR concerning for PNA and/or aspiration. Code STEMI was called early morning on 7/19. He has a h/o  oral cancer s/p mandibulectomy and XRT (2014), for which he had a PEG that was removed after tx. He has been consuming soft solids (missing bottom dentition) and thin liquids PTA. BSE in December 2019 recommended regular solids and thin liquids with pt managing chronic dysphagia seemingly well. PMH also includes: prostate ca, CVA 2013, HTN, dry mouth, self-reported GER Type of Study: Bedside Swallow Evaluation Previous Swallow  Assessment: see HPI Diet Prior to this Study: Dysphagia 3 (soft);Thin liquids Temperature Spikes Noted: Yes(100.2) Respiratory Status: Nasal cannula History of Recent Intubation: No Behavior/Cognition: Alert;Cooperative;Pleasant mood Oral Care Completed by SLP: Recent completion by staff Oral Cavity - Dentition: Missing dentition(missing all bottom dentition, some upper) Vision: Functional for self-feeding Self-Feeding Abilities: Able to feed self;Needs set up Patient Positioning: Upright in bed Baseline Vocal Quality: Normal;Other (comment)(although speech dysarthric) Volitional Swallow: Able to elicit    Oral/Motor/Sensory Function Overall Oral Motor/Sensory Function: (reduced ROM but symmetrical)   Ice Chips Ice chips: Not tested   Thin Liquid Thin Liquid: Impaired Presentation: Self Fed;Straw(pt says he never drinks without a straw bc he spills) Pharyngeal  Phase Impairments: Cough - Immediate;Multiple swallows    Nectar Thick Nectar Thick Liquid: Not tested   Honey Thick Honey Thick Liquid: Not tested   Puree Puree: Within functional limits Presentation: Self Fed;Spoon   Solid     Solid: Impaired Presentation: Self Fed Oral Phase Impairments: Impaired mastication      Luis Lyons 04/21/2019,12:13 PM   Pollyann Glen, M.A. [place] Acute Environmental education officer [phone] Office [phone]

## 2019-04-22 ENCOUNTER — Inpatient Hospital Stay (HOSPITAL_COMMUNITY): Payer: Medicare Other

## 2019-04-22 DIAGNOSIS — E876 Hypokalemia: Secondary | ICD-10-CM

## 2019-04-22 DIAGNOSIS — J9601 Acute respiratory failure with hypoxia: Secondary | ICD-10-CM

## 2019-04-22 DIAGNOSIS — I255 Ischemic cardiomyopathy: Secondary | ICD-10-CM

## 2019-04-22 DIAGNOSIS — I2102 ST elevation (STEMI) myocardial infarction involving left anterior descending coronary artery: Secondary | ICD-10-CM

## 2019-04-22 DIAGNOSIS — I5041 Acute combined systolic (congestive) and diastolic (congestive) heart failure: Secondary | ICD-10-CM

## 2019-04-22 DIAGNOSIS — I259 Chronic ischemic heart disease, unspecified: Secondary | ICD-10-CM

## 2019-04-22 LAB — CBC WITH DIFFERENTIAL/PLATELET
Abs Immature Granulocytes: 0 10*3/uL (ref 0.00–0.07)
Basophils Absolute: 0 10*3/uL (ref 0.0–0.1)
Basophils Relative: 0 %
Eosinophils Absolute: 0 10*3/uL (ref 0.0–0.5)
Eosinophils Relative: 0 %
HCT: 36.5 % — ABNORMAL LOW (ref 39.0–52.0)
Hemoglobin: 11.4 g/dL — ABNORMAL LOW (ref 13.0–17.0)
Lymphocytes Relative: 2 %
Lymphs Abs: 0.6 10*3/uL — ABNORMAL LOW (ref 0.7–4.0)
MCH: 27.2 pg (ref 26.0–34.0)
MCHC: 31.2 g/dL (ref 30.0–36.0)
MCV: 87.1 fL (ref 80.0–100.0)
Monocytes Absolute: 2.5 10*3/uL — ABNORMAL HIGH (ref 0.1–1.0)
Monocytes Relative: 9 %
Neutro Abs: 24.7 10*3/uL — ABNORMAL HIGH (ref 1.7–7.7)
Neutrophils Relative %: 89 %
Platelets: 319 10*3/uL (ref 150–400)
RBC: 4.19 MIL/uL — ABNORMAL LOW (ref 4.22–5.81)
RDW: 14.1 % (ref 11.5–15.5)
WBC: 27.7 10*3/uL — ABNORMAL HIGH (ref 4.0–10.5)
nRBC: 0 % (ref 0.0–0.2)
nRBC: 0 /100 WBC

## 2019-04-22 LAB — BASIC METABOLIC PANEL
Anion gap: 7 (ref 5–15)
BUN: 25 mg/dL — ABNORMAL HIGH (ref 8–23)
CO2: 25 mmol/L (ref 22–32)
Calcium: 9.2 mg/dL (ref 8.9–10.3)
Chloride: 106 mmol/L (ref 98–111)
Creatinine, Ser: 1.27 mg/dL — ABNORMAL HIGH (ref 0.61–1.24)
GFR calc Af Amer: >60 mL/min (ref >60)
GFR calc non Af Amer: 55 mL/min — ABNORMAL LOW (ref >60)
Glucose, Bld: 128 mg/dL — ABNORMAL HIGH (ref 70–99)
Potassium: 4.4 mmol/L (ref 3.5–5.1)
Sodium: 138 mmol/L (ref 135–145)

## 2019-04-22 LAB — GLUCOSE, CAPILLARY
Glucose-Capillary: 103 mg/dL — ABNORMAL HIGH (ref 70–99)
Glucose-Capillary: 115 mg/dL — ABNORMAL HIGH (ref 70–99)
Glucose-Capillary: 119 mg/dL — ABNORMAL HIGH (ref 70–99)
Glucose-Capillary: 128 mg/dL — ABNORMAL HIGH (ref 70–99)
Glucose-Capillary: 132 mg/dL — ABNORMAL HIGH (ref 70–99)
Glucose-Capillary: 95 mg/dL (ref 70–99)

## 2019-04-22 LAB — HEPARIN LEVEL (UNFRACTIONATED)
Heparin Unfractionated: 0.25 IU/mL — ABNORMAL LOW (ref 0.30–0.70)
Heparin Unfractionated: 0.25 IU/mL — ABNORMAL LOW (ref 0.30–0.70)

## 2019-04-22 LAB — ECHOCARDIOGRAM LIMITED
Height: 74 in
Weight: 2910.07 oz

## 2019-04-22 MED ORDER — PERFLUTREN LIPID MICROSPHERE
INTRAVENOUS | Status: AC
  Administered 2019-04-22: 3 mL
  Filled 2019-04-22: qty 10

## 2019-04-22 MED ORDER — FUROSEMIDE 10 MG/ML IJ SOLN
INTRAMUSCULAR | Status: AC
  Filled 2019-04-22: qty 4

## 2019-04-22 MED ORDER — CARVEDILOL 3.125 MG PO TABS
3.1250 mg | ORAL_TABLET | Freq: Two times a day (BID) | ORAL | Status: DC
  Administered 2019-04-22 – 2019-04-25 (×6): 3.125 mg via ORAL
  Filled 2019-04-22 (×6): qty 1

## 2019-04-22 MED ORDER — VANCOMYCIN HCL IN DEXTROSE 750-5 MG/150ML-% IV SOLN
750.0000 mg | Freq: Two times a day (BID) | INTRAVENOUS | Status: DC
  Administered 2019-04-22 – 2019-04-23 (×3): 750 mg via INTRAVENOUS
  Filled 2019-04-22 (×4): qty 150

## 2019-04-22 MED ORDER — CARVEDILOL 6.25 MG PO TABS: 6.2500 mg | ORAL_TABLET | Freq: Two times a day (BID) | ORAL | Status: DC

## 2019-04-22 MED ORDER — FUROSEMIDE 80 MG PO TABS
80.0000 mg | ORAL_TABLET | Freq: Once | ORAL | Status: AC
  Administered 2019-04-22: 80 mg via ORAL
  Filled 2019-04-22: qty 1

## 2019-04-22 MED ORDER — LEVALBUTEROL HCL 0.63 MG/3ML IN NEBU
0.6300 mg | INHALATION_SOLUTION | Freq: Three times a day (TID) | RESPIRATORY_TRACT | Status: DC | PRN
  Administered 2019-04-22: 0.63 mg via RESPIRATORY_TRACT

## 2019-04-22 MED ORDER — RESOURCE THICKENUP CLEAR PO POWD
ORAL | Status: DC | PRN
  Filled 2019-04-22: qty 125

## 2019-04-22 MED ORDER — VANCOMYCIN HCL IN DEXTROSE 750-5 MG/150ML-% IV SOLN
750.0000 mg | Freq: Two times a day (BID) | INTRAVENOUS | Status: DC
  Administered 2019-04-22: 750 mg via INTRAVENOUS
  Filled 2019-04-22 (×2): qty 150

## 2019-04-22 MED ORDER — SODIUM CHLORIDE 0.9 % IV SOLN
2.0000 g | INTRAVENOUS | Status: DC
  Administered 2019-04-22: 2 g via INTRAVENOUS
  Filled 2019-04-22: qty 2
  Filled 2019-04-22: qty 20

## 2019-04-22 MED ORDER — LOSARTAN POTASSIUM 25 MG PO TABS
25.0000 mg | ORAL_TABLET | Freq: Every day | ORAL | Status: DC
  Administered 2019-04-22 – 2019-04-29 (×8): 25 mg via ORAL
  Filled 2019-04-22 (×8): qty 1

## 2019-04-22 MED ORDER — FUROSEMIDE 10 MG/ML IJ SOLN
40.0000 mg | Freq: Once | INTRAMUSCULAR | Status: AC
  Administered 2019-04-22: 05:00:00 40 mg via INTRAVENOUS

## 2019-04-22 MED ORDER — LEVALBUTEROL HCL 0.63 MG/3ML IN NEBU
INHALATION_SOLUTION | RESPIRATORY_TRACT | Status: AC
  Filled 2019-04-22: qty 3

## 2019-04-22 NOTE — Plan of Care (Signed)
  Problem: Fluid Volume: Goal: Hemodynamic stability will improve Outcome: Progressing   Problem: Clinical Measurements: Goal: Diagnostic test results will improve Outcome: Progressing   Problem: Respiratory: Goal: Ability to maintain adequate ventilation will improve Outcome: Progressing   

## 2019-04-22 NOTE — Progress Notes (Signed)
  Echocardiogram 2D Echocardiogram has been performed.  Jannett Celestine 04/22/2019, 12:17 PM

## 2019-04-22 NOTE — Progress Notes (Signed)
  Speech Language Pathology Treatment: Dysphagia  Patient Details Name: Luis Lyons MRN: [id number] DOB: 04/17/1941 Today's Date: 04/22/2019 Time: 9937-1696 SLP Time Calculation (min) (ACUTE ONLY): 14 min  Assessment / Plan / Recommendation Clinical Impression  Events overnight noted with pt exhibiting decreased respiratory status. CXR overnight suggests new interstitial edema and persistent concern for right base PNA. He has mild, audible congestion at baseline today and is lying partially reclined with his breakfast tray in front of him. He endorses coughing during thin liquid intake, but today says that this happens often at home, which previously he had denied. Question reliability as a historian. He does have subtle, weak coughing elicited with thin liquids even when fully upright today, and when cued to cough harder he says he doesn't know how to do that. Nectar thick liquids were trialed with no coughing but congestion still noted. Per radiology, MBS could be completed this morning. Recommend proceeding with instrumental study given suspicion for an acute on chronic dysphagia and cognitive status that limits his ability to utilize precautions and strategies. Discussed with RN. Will hold any diet changes at this time pending completion of testing as radiology plans to call for the pt now.    HPI HPI: Pt is a 78 yo male admitted from SNF with CXR concerning for PNA and/or aspiration. Code STEMI was called early morning on 7/19. He has a h/o oral cancer s/p mandibulectomy and XRT (2014), for which he had a PEG that was removed after tx. He has been consuming soft solids (missing bottom dentition) and thin liquids PTA. BSE in December 2019 recommended regular solids and thin liquids with pt managing chronic dysphagia seemingly well. PMH also includes: prostate ca, CVA 2013, HTN, dry mouth, self-reported GER      SLP Plan  MBS       Recommendations  Diet recommendations: Dysphagia 3  (mechanical soft);Thin liquid Liquids provided via: Straw Medication Administration: Whole meds with puree Supervision: Patient able to self feed;Full supervision/cueing for compensatory strategies Compensations: Slow rate;Small sips/bites;Follow solids with liquid Postural Changes and/or Swallow Maneuvers: Seated upright 90 degrees                Oral Care Recommendations: Oral care BID Follow up Recommendations: Skilled Nursing facility SLP Visit Diagnosis: Dysphagia, unspecified (R13.10) Plan: MBS       GO                Venita Sheffield Krisy Dix 04/22/2019, 10:11 AM  Pollyann Glen, M.A. [place] Acute Environmental education officer [phone] Office [phone]

## 2019-04-22 NOTE — Progress Notes (Signed)
[place] for Heparin Indication: chest pain/ACS  Allergies  Allergen Reactions  . Bactrim [Sulfamethoxazole-Trimethoprim] Other (See Comments)    Listed on [facility]    Patient Measurements: Height: 6' 2.02" (188 cm) Weight: 181 lb 14.1 oz (82.5 kg) IBW/kg (Calculated) : 82.24  Heparin Dosing Wt: 90.7 kg  Vital Signs: Temp: 98 F (36.7 C) (07/20 1307) Temp Source: Oral (07/20 1307) BP: 126/66 (07/20 1307) Pulse Rate: 83 (07/20 1307)  Labs: Recent Labs    04/20/19 1911  04/21/19 0609 04/21/19 0934 04/21/19 1159 04/21/19 1454 04/22/19 0718 04/22/19 1601  HGB 11.7*  --   --   --   --   --  11.4*  --   HCT 37.9*  --   --   --   --   --  36.5*  --   PLT 321  --   --   --   --   --  319  --   LABPROT 15.1  --   --   --   --   --   --   --   INR 1.2  --   --   --   --   --   --   --   HEPARINUNFRC  --   --   --   --  0.27*  --  0.25* 0.25*  CREATININE 1.69*  --  1.66*  --   --   --  1.27*  --   TROPONINIHS  --    < > 3,566* 4,179* 4,027* 4,159*  --   --    < > = values in this interval not displayed.    Estimated Creatinine Clearance: 57.5 mL/min (A) (by C-G formula based on SCr of 1.27 mg/dL (H)).   Medical History: Past Medical History:  Diagnosis Date  . Abdominal hernia   . Cancer ([place])   . Constipation   . Cough   . Depression   . Dry mouth   . Hard of hearing   . Hypertension   . Muscle weakness   . Stroke ([place])   . Urinary incontinence   . Vitamin D deficiency   . Vomiting   . Wheezing     Assessment: 78 yr old male presenting with SOB and fever, EKG changes showing STEMI.   Heparin level at 16:01 today (~7.5 hrs after last rate increase to 1300 units/hr) was 0.25 units/ml, which is below goal range for heparin. Per RN, no issues with IV or signs/symptoms of bleeding. CBC stable. . Goal of Therapy:  Heparin level 0.3-0.7 units/ml Monitor platelets by anticoagulation protocol: Yes   Plan:  Increase heparin  infusion to 1450 units/hr Check heparin level 8 hrs after infusion rate increase, then daily Monitor CBC daily Monitor for signs/symptoms of bleeding  Gillermina Hu, PharmD, BCPS, [facility] Clinical Pharmacist Please check AMION for all [place] numbers 04/22/2019 5:03 PM

## 2019-04-22 NOTE — Progress Notes (Signed)
Modified Barium Swallow Progress Note  Patient Details  Name: Luis Lyons MRN: [id number] Date of Birth: 1940-10-15  Today's Date: 04/22/2019  Modified Barium Swallow completed.  Full report located under Chart Review in the Imaging Section.  Brief recommendations include the following:  Clinical Impression  Pt has a moderate oral and mild pharyngeal dysphaghia that is felt to be acute on chronic in the setting of acute illness and h/o XRT and surgical intervention for oral cancer. He has reduced labial seal and occasional, small amounts of anterior leakage, of which he is aware. Oral holding is noted, but he does start posterior transit when cued. Mastication is prolonged and incomplete with soft solids. With all consistencies his bolus cohesion is mildly decreased, lingual propulsion is slow, and base of tongue retraction is reduced. There is residue present on the posterior tongue and valleculae after the swallow. Thin liquids tend to fill the valleculae before the swallow, so when there is residue already in the vallecular space, they spill further into the pharynx and are then penetrated during the swallow. Although most of the penetrates clear spontaneously, not all do. A chin tuck helps to reduce the amount of vallecular residue present, and therefore helps to reduce the amount of penetration that occurs. Of note, could not get pt to take the consecutive straw sips that he typically takes at bedside that trigger coughing. [name]. Given the above, but also considering pt's tendency to drink impulsively and in a semi-reclined position in his room with subsequent coughing, would favor a more conservative diet while he is deconditioned: Dys 2 (finely chopepd) and nectar thick liquids. There is potential for advancement at least of liquids as he shows signs of overall medical improvement.   Swallow Evaluation Recommendations       SLP  Diet Recommendations: Dysphagia 2 (Fine chop) solids;Nectar thick liquid   Liquid Administration via: Straw   Medication Administration: Crushed with puree   Supervision: Staff to assist with self feeding;Full supervision/cueing for compensatory strategies   Compensations: Minimize environmental distractions;Slow rate;Small sips/bites;Use straw to facilitate chin tuck   Postural Changes: Remain semi-upright after after feeds/meals (Comment);Seated upright at 90 degrees   Oral Care Recommendations: Oral care BID   Other Recommendations: Order thickener from pharmacy;Prohibited food (jello, ice cream, thin soups);Remove water pitcher    Venita Sheffield Baudelia Schroepfer 04/22/2019,11:44 AM   Pollyann Glen, M.A. [place] Acute Environmental education officer [phone] Office [phone]

## 2019-04-22 NOTE — Progress Notes (Addendum)
Progress Note  Patient Name: Luis Lyons Date of Encounter: 04/22/2019  Primary Cardiologist: New  Subjective   No chest pain. Breathing improved.   Inpatient Medications    Scheduled Meds: . aspirin EC  81 mg Oral Daily  . atorvastatin  80 mg Oral q1800  . insulin aspart  0-9 Units Subcutaneous Q4H  . metoprolol tartrate  12.5 mg Oral BID  . sodium chloride flush  3 mL Intravenous Q12H   Continuous Infusions: . cefTRIAXone (ROCEPHIN)  IV 2 g (04/21/19 1600)  . heparin 1,150 Units/hr (04/21/19 2304)  . vancomycin 1,250 mg (04/21/19 2145)   PRN Meds: acetaminophen **OR** acetaminophen, levalbuterol, polyethylene glycol   Vital Signs    Vitals:   04/22/19 0001 04/22/19 0416 04/22/19 0451 04/22/19 0522  BP: 130/83 (!) 172/98 (!) 157/80 (!) 150/85  Pulse: 83 (!) 129 (!) 110 (!) 104  Resp: (!) 25 (!) 36 (!) 29 (!) 26  Temp: 97.6 F (36.4 C) (!) 97.5 F (36.4 C)    TempSrc: Oral Oral    SpO2: 95% 90% 100% 100%  Weight:      Height:        Intake/Output Summary (Last 24 hours) at 04/22/2019 0823 Last data filed at 04/22/2019 0500 Gross per 24 hour  Intake 2119.92 ml  Output 825 ml  Net 1294.92 ml   Last 3 Weights 04/21/2019 04/20/2019 09/13/2018  Weight (lbs) 181 lb 14.1 oz 200 lb 205 lb 4 oz  Weight (kg) 82.5 kg 90.719 kg 93.1 kg      Telemetry    SR at 90s with intermittent in 110s- Personally Reviewed  ECG    Sinus tachycardia at rate of 126 bpm, TWI laterally  - Personally Reviewed  Physical Exam   GEN: Thin frail elderly male in no acute distress.   Neck: No JVD Cardiac: RRR, no murmurs, rubs, or gallops.  Respiratory: Clear to auscultation bilaterally. GI: Soft, nontender, non-distended  MS: No edema; No deformity. Neuro: dysarthric Nonfocal  Psych: Normal affect   Labs    High Sensitivity Troponin:   Recent Labs  Lab 04/21/19 0609 04/21/19 0934 04/21/19 1159 04/21/19 1454  TROPONINIHS 3,566* 4,179* 4,027* 4,159*      Chemistry  Recent Labs  Lab 04/20/19 1911 04/21/19 0609 04/22/19 0718  NA 135 138 138  K 3.4* 4.5 4.4  CL 103 109 106  CO2 19* 19* 25  GLUCOSE 235* 175* 128*  BUN 24* 24* 25*  CREATININE 1.69* 1.66* 1.27*  CALCIUM 9.2 8.6* 9.2  PROT 8.2* 7.4  --   ALBUMIN 2.8* 2.4*  --   AST 26 45*  --   ALT 15 17  --   ALKPHOS 86 67  --   BILITOT 1.2 0.5  --   GFRNONAA 39* 39* 55*  GFRAA 45* 46* >60  ANIONGAP 13 10 7      Hematology Recent Labs  Lab 04/20/19 1911 04/22/19 0718  WBC 22.8* 27.7*  RBC 4.32 4.19*  HGB 11.7* 11.4*  HCT 37.9* 36.5*  MCV 87.7 87.1  MCH 27.1 27.2  MCHC 30.9 31.2  RDW 13.7 14.1  PLT 321 319    Radiology    Dg Chest Port 1 View  Result Date: 04/22/2019 CLINICAL DATA:  Shortness of breath EXAM: PORTABLE CHEST 1 VIEW COMPARISON:  Two days ago FINDINGS: New Kerley lines. Asymmetric airspace density at the medial right base. No effusion or pneumothorax. Cardiomegaly. IMPRESSION: 1. New interstitial edema. 2. Continued concern for right base pneumonia. Electronically  Signed   By: Monte Fantasia M.D.   On: 04/22/2019 05:16   Dg Chest Port 1 View  Result Date: 04/20/2019 CLINICAL DATA:  Respiratory distress EXAM: PORTABLE CHEST 1 VIEW COMPARISON:  September 02, 2018 FINDINGS: The patient is rotated which limits evaluation. There airspace opacities in the right middle lobe and at the left lung base. No pneumothorax. Pleuroparenchymal scarring is noted at the lung apices. The heart size is stable. Aortic calcifications are again noted. There is no acute osseous abnormality. No pneumothorax. IMPRESSION: Probable opacities involving the left lower lobe and right middle lobe concerning for pneumonia, aspiration, or atelectasis. Electronically Signed   By: Constance Holster M.D.   On: 04/20/2019 19:49    Cardiac Studies   Echo 04/21/2019 1. The left ventricle has severely reduced systolic function, with an ejection fraction of 25-30%. The cavity size was normal. There is mildly  increased left ventricular wall thickness. Left ventricular diastolic Doppler parameters are consistent with  pseudonormalization. Elevated mean left atrial pressure.  2. The right ventricle has normal systolic function. The cavity was normal.  3. The mitral valve is abnormal. There is severe mitral annular calcification present.  4. The aortic root is normal in size and structure.  5. The aortic valve was not well visualized. No stenosis of the aortic valve.  6. Trivial pericardial effusion is present.  7. The tricuspid valve is grossly normal.  8. Akinesis of the anteroseptal wall and apex with overall severe LV dysfunction; moderate diastolic dysfunction; mild LVH; cannot R/O apical thrombus; possible density in pericardial space most prominent on apical 4 chamber views; suggest cardiac MRI to further assess LV apex and pericardial space.  Patient Profile     Luis Lyons is a 78 y.o. former smoker and nursing home resident with a history of HTN, CVA with residual left sided weakness, who presented with fever and hypoxic respiratory failure in the setting of presumed bacterial pneumonia. Routine EKG yesterday morning revealed 2-3 mm ST elevations in the anterolateral leads (V3-V6) with subtle reciprocal changes inferiorly >> cardiology is consulted>>reviewed with Dr. Gwenlyn Found (interventionalist on call) who felt that given his comorbidities medical conditions, clinical improvement, and lack of symptoms>> medical therapy.   Assessment & Plan    1. STEMI - Resolved ST segment by repeat EKG this morning with Q wave. No chest pain.  - Echo showed severely reduced LVEF at 25-30%; akinesis of the anteroseptal wall and apex with overall severe LV dysfunction; moderate diastolic dysfunction; mild LVH; cannot R/O apical thrombus; possible density in pericardial space most prominent on apical 4 chamber views; suggest cardiac MRI to further assess LV apex and pericardial space. - Continue IV heparin x 72 hr    -- ASA, statin and BB - Further plan per Dr. Ellyn Hack.  EKG much less concerning today - suspect evolutionary changes of Anterior STEMi  (will need to check limited Echo with Definity to exclude apical thrombus - may need Warfarin, if not would add Plavix to ASA),  Will convert from Metoprolol to Coreg 3.125 mg BID  2. Chronic combined CHF - Euvolemic. LVEF of 25-35% with  Antero apical WM abnormality. Breathing improved. Increase metoprolol to 25mg  BID (Consolidate to long active VS change to coreg). Will add low dose ARB/Entresto Per MD.   Adonis Housekeeper to Carvedilol & add low dose ARB (will need to see if BP will tolerate ARB & BB before considering Entresto - especially with potential Palliative Care in OP setting.   Seems euvolemic -  will hold off on diuretic.  3. HTN - BP elevated. Medications changes as above.   Adding low dose ARB & changing BB to Carvedilol   4. Sepsis/ Acute respiratory failure/ multifocal PNA - Per primary  - COVID negative  5. AKI - Scr improving 1.69>>1.66>>1.27      Jarrett Soho, PA  04/22/2019, 8:23 AM     ATTENDING ATTESTATION  I have seen, examined and evaluated the patient this AM along with Mr. Curly [place], [place].  After reviewing all the available data and chart, we discussed the patients laboratory, study & physical findings as well as symptoms in detail. I agree with his findings, examination as well as impression recommendations as per our discussion.    Attending adjustments noted in italics.   Very difficult situation a 78 year old gentleman who I have not 100 since sure if he fully understands was happening.  He did have clear evidence of anterior ST elevation, but had no chest pain symptoms.  His echocardiogram clearly shows a relatively large dense anterior infarct.  We need to recheck a 2D echo with Definity to exclude apical thrombus as this will help [place] decide whether we use warfarin or Plavix.  At this point in time, I  question the benefit of taking him to the Cath Lab in the absence of chest pain, resolution of ST changes and the dense wall motion abnormality suggesting essentially completed anterior infarct.  He has Q waves already now.  I think treating with 72 hours of heparin which will allow [place] to reassess for thrombus is reasonable. We will convert his beta-blocker to carvedilol and add low-dose ARB.  At some point we do need to communicate with his daughter to ensure she understands.  I think with his baseline status of being wheelchair-bound and dysarthria, perhaps medical management is the best option.  I could not get a sense from him whether he would be interested in invasive procedures or not.    Glenetta Hew, M.D., M.S. Interventional Cardiologist   Pager # [phone] Phone # [phone] [address] [place],  [address]    For questions or updates, please contact [place] Please consult [email or web address] for contact info under

## 2019-04-22 NOTE — Progress Notes (Signed)
[place] for Heparin Indication: chest pain/ACS  Allergies  Allergen Reactions  . Bactrim [Sulfamethoxazole-Trimethoprim] Other (See Comments)    Listed on [facility]    Patient Measurements: Height: 6\' 2"  (188 cm) Weight: 181 lb 14.1 oz (82.5 kg) IBW/kg (Calculated) : 82.2  Vital Signs: Temp: 97.5 F (36.4 C) (07/20 0416) Temp Source: Oral (07/20 0416) BP: 150/85 (07/20 0522) Pulse Rate: 104 (07/20 0522)  Labs: Recent Labs    04/20/19 1911  04/21/19 0609 04/21/19 0934 04/21/19 1159 04/21/19 1454 04/22/19 0718  HGB 11.7*  --   --   --   --   --  11.4*  HCT 37.9*  --   --   --   --   --  36.5*  PLT 321  --   --   --   --   --  319  LABPROT 15.1  --   --   --   --   --   --   INR 1.2  --   --   --   --   --   --   HEPARINUNFRC  --   --   --   --  0.27*  --  0.25*  CREATININE 1.69*  --  1.66*  --   --   --  1.27*  TROPONINIHS  --    < > 3,566* 4,179* 4,027* 4,159*  --    < > = values in this interval not displayed.    Estimated Creatinine Clearance: 57.5 mL/min (A) (by C-G formula based on SCr of 1.27 mg/dL (H)).   Medical History: Past Medical History:  Diagnosis Date  . Abdominal hernia   . Cancer ([place])   . Constipation   . Cough   . Depression   . Dry mouth   . Hard of hearing   . Hypertension   . Muscle weakness   . Stroke ([place])   . Urinary incontinence   . Vitamin D deficiency   . Vomiting   . Wheezing     Medications:  Medications Prior to Admission  Medication Sig Dispense Refill Last Dose  . acetaminophen (TYLENOL) 325 MG tablet Take 650 mg by mouth every 4 (four) hours as needed (temp 100.4 or greater).    unk  . albuterol (PROVENTIL) (2.5 MG/3ML) 0.083% nebulizer solution Take 2.5 mg by nebulization 2 (two) times daily as needed for wheezing or shortness of breath.   11/04/2018  . aspirin 81 MG chewable tablet Chew 81 mg by mouth daily.   04/20/2019 at Unknown time  . chlorhexidine (PERIDEX) 0.12 % solution 15  mLs by Mouth Rinse route 2 (two) times a day.    04/19/2019  . cholecalciferol (VITAMIN D3) 25 MCG (1000 UT) tablet Take 1,000 Units by mouth daily.   04/20/2019 at Unknown time  . coal tar (NEUTROGENA T-GEL) 0.5 % shampoo Apply 1 application topically 2 (two) times a week. Tuesday and Friday   04/19/2019  . dextromethorphan (DELSYM) 30 MG/5ML liquid Take 60 mg by mouth 2 (two) times daily as needed for cough.   12/29/2018  . docusate sodium (COLACE) 100 MG capsule Take 200 mg by mouth daily.   04/20/2019 at Unknown time  . fluticasone (FLONASE) 50 MCG/ACT nasal spray Place 1 spray into both nostrils daily.   04/20/2019 at Unknown time  . HYDROcodone-acetaminophen (NORCO/VICODIN) 5-325 MG tablet Take 1 tablet by mouth every 6 (six) hours as needed for severe pain. 10 tablet 0 04/19/2019  .  ibuprofen (ADVIL,MOTRIN) 200 MG tablet Take 400 mg by mouth every 6 (six) hours as needed for moderate pain.   03/08/2019  . ipratropium-albuterol (DUONEB) 0.5-2.5 (3) MG/3ML SOLN Take 3 mLs by nebulization 4 (four) times daily.    04/20/2019 at Unknown time  . magnesium hydroxide (MILK OF MAGNESIA) 400 MG/5ML suspension Take 30 mLs by mouth daily as needed for mild constipation.   03/22/2019  . oxybutynin (DITROPAN-XL) 5 MG 24 hr tablet Take 5 mg by mouth at bedtime.   04/19/2019  . phenazopyridine (PYRIDIUM) 200 MG tablet Take 200 mg by mouth 3 (three) times daily as needed for pain.   04/20/2019 at Unknown time  . polyethylene glycol (MIRALAX / GLYCOLAX) packet Take 17 g by mouth daily.   04/20/2019 at Unknown time  . polyvinyl alcohol (LIQUIFILM TEARS) 1.4 % ophthalmic solution Place 1 drop into both eyes 2 (two) times daily.   04/20/2019 at Unknown time  . Probiotic Product (RISA-BID PROBIOTIC PO) Take 2 tablets by mouth 2 (two) times a day.   04/20/2019 at Unknown time  . senna (SENOKOT) 8.6 MG tablet Take 1 tablet by mouth 2 (two) times daily as needed for constipation.   10/04/2018  . senna (SENOKOT) 8.6 MG tablet Take 2  tablets by mouth at bedtime.   04/19/2019  . Simethicone 180 MG CAPS Take 180 mg by mouth 2 (two) times daily as needed (gas).   unk  . sodium fluoride (DENTAGEL) 1.1 % GEL dental gel Place 1 application onto teeth 2 (two) times daily.   04/20/2019 at Unknown time  . vitamin B-12 (CYANOCOBALAMIN) 1000 MCG tablet Take 1,000 mcg by mouth daily.   04/20/2019 at Unknown time  . zinc oxide 20 % ointment Apply 1 application topically 2 (two) times daily as needed (scrotum for redness).    unk  . zinc sulfate 220 (50 Zn) MG capsule Take 220 mg by mouth daily.   04/20/2019 at Unknown time    Assessment: 78 y.o. male presenting with SOB and fever, now EKG changes showing STEMI.   Heparin level subtherapeutic at 0.25 this AM  Goal of Therapy:  Heparin level 0.3-0.7 units/ml Monitor platelets by anticoagulation protocol: Yes   Plan:  Increase heparin gtt to 1300 units/hr F/u 6 hour heparin level  Bertis Ruddy, PharmD Clinical Pharmacist Please check AMION for all [place] numbers 04/22/2019 8:19 AM

## 2019-04-22 NOTE — Progress Notes (Signed)
Pt diaphoretic and reports nausea.  BP 172/98, HR 131, RR 32, O2 sat 80s on RA with rhonchi t/o.  O2 at 2L applied with sats 90-91%.  EKG no acute changes. CXR modified to stat. [facility] notified.  Orders received.

## 2019-04-22 NOTE — Progress Notes (Addendum)
PROGRESS NOTE  Luis Lyons RXV:[id number] DOB: 08-Aug-1941 DOA: 04/20/2019 PCP: Elson Clan, MD  Brief History   78 year old Caucasian male skilled nursing home resident Prior CVA?  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-previous trach PEG 2014--has had previous episodes of aspiration Prior admission ex lap 09/05/2018 -Came to [facility] 7/18 fever 103-acute hypoxic respiratory failure initially placed on CPAP acute kidney injury creatinine up from 1.1-1.6 WBC 22 Started on empiric treatment healthcare associated pneumonia EKG done confirmed STEMI across anteroseptal leads Cardiology consulted recommending medical management   A & P  Aspiration pneumonia Hypoxic respiratory failure secondary to combination of above and saline resuscitation for sepsis Speech therapy saw appreciate input dysphagia 2 diet Continue vancomycin and ceftriaxone do not de-escalate until cultures finalized Fluids 125 cc/H discontinued given a dose of Lasix 7/19--hypoxia resolved--- is currently +1.5 L giving 80 mg po Lasix today Code STEMI called early morning 7/19 Cardiac event/MI 7/19-poor candidate for intervention Medical management-Coreg 3.125 twice daily, losartan 25, Lipitor 80 Continues on IV heparin Prostate cancer status post prostatectomy- Sees [facility] Dr. Juanda Crumble Foley for strict I/os ?  E. coli cystitis Unclear how urine was collected->100,000 colony-forming units Prior CVA 2013 No overt focal deficits Squamous cell CA mouth status post mandibulectomy, risk for aspiration As above-outpatient follow-up [facility] oncology HTN See above discussion       DVT prophylaxis: IV heparin at this tim Code Status: DNR confirmed Family Communication: called but didn't reach [name] patient daughter--left detailed VM for her Disposition Plan:  needs to be anticoagulated with heparin for 72 hours and then further discussions as per cardiology Need to de-escalate antibiotics not ready for discharge Up with therapy later today   Verneita Griffes, MD [facility] 2:44 PM  04/22/2019, 2:44 PM  LOS: 2 days   Consultants  . Cardiology  Procedures  . Chest x-ray 1 view . Repeat chest x-ray 7/20  Antibiotics  . Vancomycin and cefepime  Interval History/Subjective   Coherent to some degree but does not seem to understand what tests were done Is hungry No other issues at this time   Objective   Vitals:  Vitals:   04/22/19 0841 04/22/19 1307  BP: (!) 130/114 126/66  Pulse: 99 83  Resp:  (!) 24  Temp:  98 F (36.7 C)  SpO2:  97%    Exam:  Awake coherent pleasant no distress slight confusion Chest clear no added sound no rales no egophony Poor dentition postop changes to mandible Neck soft supple cannot appreciate JVD S1-S2 no murmur sinus rhythm on monitors Abdomen soft Condom catheter in place No lower extremity edema     I have personally reviewed the following:   Today's Data  .   Lab Data  . Potassium 4.5 . BUN/creatinine down from 24/1 0.6-20 4/1.6 . Troponin 3566---> peaked at 4179 and is now trending down  [employer]  . Await Legionella . Urine culture growing >100,000 CFU E. Coli . Blood culture X2 gram-positive cocci still pending  Imaging  . X-ray as above . Repeat chest x-ray 7/20 shows some clearing of infiltrates although my over read of x-ray does not really show infiltrates to begin with  Cardiology Data  . EKG shows ST-T wave changes V1 through these 3  Other Data  . None  Scheduled Meds: . aspirin EC  81 mg Oral Daily  . atorvastatin  80 mg Oral q1800  . carvedilol  3.125 mg Oral BID WC  . furosemide  80 mg Oral Once  . insulin aspart  0-9 Units Subcutaneous Q4H  . losartan  25 mg Oral Daily  . sodium chloride flush  3 mL Intravenous Q12H   Continuous Infusions: .  heparin 1,300 Units/hr (04/22/19 0840)    Principal Problem:   Sepsis due to pneumonia ([place]) Active Problems:   Acute respiratory failure with hypoxia (HCC)   History of CVA (cerebrovascular accident)   Hyperglycemia   Hypokalemia   Renal insufficiency   Prolonged QT interval   LOS: 2 days   How to contact the [facility] Attending or Consulting provider [place] or covering provider during after hours [place], for this patient?  1. Check the care team in [facility] and look for a) attending/consulting [facility] provider listed and b) the [facility] team listed 2. Log into [email or web address] and use [place]'s universal password to access. If you do not have the password, please contact the hospital operator. 3. Locate the [facility] provider you are looking for under [facility] and page to a number that you can be directly reached. 4. If you still have difficulty reaching the provider, please page the [facility] (Director on Call) for the Hospitalists listed on amion for assistance.

## 2019-04-23 DIAGNOSIS — Z8673 Personal history of transient ischemic attack (TIA), and cerebral infarction without residual deficits: Secondary | ICD-10-CM

## 2019-04-23 DIAGNOSIS — I4891 Unspecified atrial fibrillation: Secondary | ICD-10-CM

## 2019-04-23 LAB — GLUCOSE, CAPILLARY
Glucose-Capillary: 106 mg/dL — ABNORMAL HIGH (ref 70–99)
Glucose-Capillary: 136 mg/dL — ABNORMAL HIGH (ref 70–99)
Glucose-Capillary: 91 mg/dL (ref 70–99)
Glucose-Capillary: 95 mg/dL (ref 70–99)
Glucose-Capillary: 98 mg/dL (ref 70–99)
Glucose-Capillary: 98 mg/dL (ref 70–99)

## 2019-04-23 LAB — CBC
HCT: 35.3 % — ABNORMAL LOW (ref 39.0–52.0)
Hemoglobin: 11.2 g/dL — ABNORMAL LOW (ref 13.0–17.0)
MCH: 27.5 pg (ref 26.0–34.0)
MCHC: 31.7 g/dL (ref 30.0–36.0)
MCV: 86.5 fL (ref 80.0–100.0)
Platelets: 286 10*3/uL (ref 150–400)
RBC: 4.08 MIL/uL — ABNORMAL LOW (ref 4.22–5.81)
RDW: 14 % (ref 11.5–15.5)
WBC: 11.9 10*3/uL — ABNORMAL HIGH (ref 4.0–10.5)
nRBC: 0 % (ref 0.0–0.2)

## 2019-04-23 LAB — LEGIONELLA PNEUMOPHILA SEROGP 1 UR AG: L. pneumophila Serogp 1 Ur Ag: NEGATIVE

## 2019-04-23 LAB — URINE CULTURE: Culture: >=100000 — AB

## 2019-04-23 LAB — HEPARIN LEVEL (UNFRACTIONATED)
Heparin Unfractionated: 0.29 IU/mL — ABNORMAL LOW (ref 0.30–0.70)
Heparin Unfractionated: 0.37 IU/mL (ref 0.30–0.70)

## 2019-04-23 MED ORDER — AMIODARONE LOAD VIA INFUSION
150.0000 mg | Freq: Once | INTRAVENOUS | Status: AC
  Administered 2019-04-23: 150 mg via INTRAVENOUS
  Filled 2019-04-23: qty 83.34

## 2019-04-23 MED ORDER — AMIODARONE HCL IN DEXTROSE 360-4.14 MG/200ML-% IV SOLN
60.0000 mg/h | INTRAVENOUS | Status: AC
  Administered 2019-04-23 (×2): 60 mg/h via INTRAVENOUS
  Filled 2019-04-23 (×2): qty 200

## 2019-04-23 MED ORDER — AMIODARONE HCL IN DEXTROSE 360-4.14 MG/200ML-% IV SOLN
30.0000 mg/h | INTRAVENOUS | Status: DC
  Administered 2019-04-23: 30 mg/h via INTRAVENOUS
  Filled 2019-04-23: qty 200

## 2019-04-23 MED ORDER — SODIUM CHLORIDE 0.9 % IV SOLN
1000.0000 mg | Freq: Three times a day (TID) | INTRAVENOUS | Status: AC
  Administered 2019-04-23 – 2019-04-26 (×11): 1000 mg via INTRAVENOUS
  Filled 2019-04-23 (×12): qty 1

## 2019-04-23 MED ORDER — WARFARIN SODIUM 5 MG PO TABS
5.0000 mg | ORAL_TABLET | Freq: Once | ORAL | Status: AC
  Administered 2019-04-23: 5 mg via ORAL
  Filled 2019-04-23: qty 1

## 2019-04-23 MED ORDER — WARFARIN - PHARMACIST DOSING INPATIENT
Freq: Every day | Status: DC
  Administered 2019-04-23 – 2019-04-24 (×2)

## 2019-04-23 MED ORDER — ASPIRIN 81 MG PO CHEW
81.0000 mg | CHEWABLE_TABLET | Freq: Every day | ORAL | Status: DC
  Administered 2019-04-23 – 2019-04-26 (×4): 81 mg via ORAL
  Filled 2019-04-23 (×4): qty 1

## 2019-04-23 NOTE — Progress Notes (Addendum)
[place] for Heparin Indication: chest pain/ACS  Allergies  Allergen Reactions  . Bactrim [Sulfamethoxazole-Trimethoprim] Other (See Comments)    Listed on [facility]    Patient Measurements: Height: 6' 2.02" (188 cm) Weight: 181 lb 14.1 oz (82.5 kg) IBW/kg (Calculated) : 82.24  Heparin Dosing Wt: 90.7 kg  Vital Signs: Temp: 97.4 F (36.3 C) (07/21 0101) Temp Source: Oral (07/21 0101) BP: 116/80 (07/21 0040) Pulse Rate: 98 (07/21 0040)  Labs: Recent Labs    04/20/19 1911  04/21/19 0609 04/21/19 0934  04/21/19 1159 04/21/19 1454 04/22/19 0718 04/22/19 1601 04/23/19 0028  HGB 11.7*  --   --   --   --   --   --  11.4*  --   --   HCT 37.9*  --   --   --   --   --   --  36.5*  --   --   PLT 321  --   --   --   --   --   --  319  --   --   LABPROT 15.1  --   --   --   --   --   --   --   --   --   INR 1.2  --   --   --   --   --   --   --   --   --   HEPARINUNFRC  --   --   --   --    < > 0.27*  --  0.25* 0.25* 0.37  CREATININE 1.69*  --  1.66*  --   --   --   --  1.27*  --   --   TROPONINIHS  --    < > 3,566* 4,179*  --  4,027* 4,159*  --   --   --    < > = values in this interval not displayed.    Estimated Creatinine Clearance: 57.5 mL/min (A) (by C-G formula based on SCr of 1.27 mg/dL (H)).   Medical History: Past Medical History:  Diagnosis Date  . Abdominal hernia   . Cancer ([place])   . Constipation   . Cough   . Depression   . Dry mouth   . Hard of hearing   . Hypertension   . Muscle weakness   . Stroke ([place])   . Urinary incontinence   . Vitamin D deficiency   . Vomiting   . Wheezing     Assessment: 78 yr old male presenting with SOB and fever, EKG changes showing STEMI. On heparin.  7/21 AM update: heparin level therapeutic x 1 after rate increase  Goal of Therapy:  Heparin level 0.3-0.7 units/ml Monitor platelets by anticoagulation protocol: Yes   Plan:  Cont heparin at 1450 units/hr Confirmatory heparin  level in 6-8 hours Monitor CBC daily Monitor for signs/symptoms of bleeding  Narda Bonds, PharmD, BCPS Clinical Pharmacist Phone: [phone]

## 2019-04-23 NOTE — Evaluation (Signed)
Physical Therapy Evaluation Patient Details Name: Luis Lyons MRN: [id number] DOB: 12-Sep-1941 Today's Date: 04/23/2019   History of Present Illness  78 yo SNF resident admitted with aspiration PNA, respiratory failure, sepsis with STEMI 7/19. PMhx: prostate CA, mouth CA s/p mandibulectomy, HTN, CVA with right weakness  Clinical Impression  Pt in bed on arrival on 2L but able to maintain 95% on RA throughout session. Pt with impaired speech due to mandibulectomy but providing highly conflicting history. In one statement pt stated he walked a few days ago then the next statement that he doesn't walk and even that staff doesn't help him OOB. Unclear how much movement and assist he truly performs at [facility] as pt was able to stand and pivot last admission in Dec. Today pt able to stand with mod assist and pivot to chair and should do well with stedy for transfers OOB daily with nursing staff. Pt with noted right weakness, impaired balance, decreased strength and transfers who will benefit from acute therapy to maximize mobility for cardiopulmonary and musculoskeletal conditioning.     Follow Up Recommendations SNF    Equipment Recommendations  None recommended by PT    Recommendations for Other Services       Precautions / Restrictions Precautions Precautions: Fall Precaution Comments: right weakness      Mobility  Bed Mobility Overal bed mobility: Needs Assistance Bed Mobility: Supine to Sit     Supine to sit: Mod assist;+2 for physical assistance     General bed mobility comments: mod assist to help move legs off of bed and elevate trunk with 2 person assist for safety and to fully pivot to EOB  Transfers Overall transfer level: Needs assistance   Transfers: Sit to/from Stand;Stand Pivot Transfers Sit to Stand: Mod assist;+2 physical assistance Stand pivot transfers: Mod assist;+2 physical assistance       General transfer comment: Mod +2 assist with cues for hand placement  and assist to perform UE assist on therapist arms. Pt stood x 3 trials from bed with ability to slow shift weight with assist to pivot to chair to left  Ambulation/Gait             General Gait Details: non ambulatory at baseline  Stairs            Wheelchair Mobility    Modified Rankin (Stroke Patients Only)       Balance Overall balance assessment: Needs assistance Sitting-balance support: Feet supported;Single extremity supported Sitting balance-Leahy Scale: Fair Sitting balance - Comments: pt initially min assist for sitting balance but able to achieve minguard for several minutes     Standing balance-Leahy Scale: Poor Standing balance comment: bil UE support and assist in standing                             Pertinent Vitals/Pain Pain Assessment: 0-10 Faces Pain Scale: Hurts little more Pain Location: scrotum with mobility Pain Descriptors / Indicators: Sore Pain Intervention(s): Limited activity within patient's tolerance;Repositioned    Home Living Family/patient expects to be discharged to:: Skilled nursing facility                 Additional Comments: pt has been at [facility] for 2 years per pt    Prior Function Level of Independence: Needs assistance   Gait / Transfers Assistance Needed: per last admission pt stating he transfers to W/C without assist. Today pt with varied report that he doesn't get  up, 2 staff get him up, he has a W/c unsure how much he truly moves at SNF  ADL's / Homemaking Assistance Needed: requires assistance        Hand Dominance        Extremity/Trunk Assessment   Upper Extremity Assessment Upper Extremity Assessment: Defer to OT evaluation    Lower Extremity Assessment Lower Extremity Assessment: Generalized weakness;RLE deficits/detail;LLE deficits/detail(grossly 2/5 Bil LE strength)    Cervical / Trunk Assessment Cervical / Trunk Assessment: Kyphotic  Communication    Communication: Other (comment)(impaired speech due to mandibulectomy)  Cognition Arousal/Alertness: Awake/alert Behavior During Therapy: WFL for tasks assessed/performed Overall Cognitive Status: No family/caregiver present to determine baseline cognitive functioning                                 General Comments: pt with highly varied reports of PLOF. Pt oriented to place and self      General Comments      Exercises     Assessment/Plan    PT Assessment Patient needs continued PT services  PT Problem List Decreased strength;Decreased mobility;Decreased activity tolerance;Decreased balance;Decreased knowledge of use of DME       PT Treatment Interventions DME instruction;Therapeutic activities;Cognitive remediation;Therapeutic exercise;Patient/family education;Balance training;Functional mobility training    PT Goals (Current goals can be found in the Care Plan section)  Acute Rehab PT Goals Patient Stated Goal: pt agreeable to increase mobility and get OOB PT Goal Formulation: With patient Time For Goal Achievement: 05/07/19 Potential to Achieve Goals: Fair    Frequency Min 2X/week   Barriers to discharge        Co-evaluation PT/OT/SLP Co-Evaluation/Treatment: Yes Reason for Co-Treatment: Complexity of the patient's impairments (multi-system involvement);For patient/therapist safety PT goals addressed during session: Mobility/safety with mobility;Balance         AM-PAC PT "6 Clicks" Mobility  Outcome Measure Help needed turning from your back to your side while in a flat bed without using bedrails?: A Lot Help needed moving from lying on your back to sitting on the side of a flat bed without using bedrails?: A Lot Help needed moving to and from a bed to a chair (including a wheelchair)?: A Lot Help needed standing up from a chair using your arms (e.g., wheelchair or bedside chair)?: A Lot Help needed to walk in hospital room?: Total Help needed  climbing 3-5 steps with a railing? : Total 6 Click Score: 10    End of Session Equipment Utilized During Treatment: Gait belt Activity Tolerance: Patient tolerated treatment well Patient left: in chair;with call bell/phone within reach;with chair alarm set Nurse Communication: Need for lift equipment;Mobility status(recommend stedy for use with nursing) PT Visit Diagnosis: Other abnormalities of gait and mobility (R26.89);Muscle weakness (generalized) (M62.81);Unsteadiness on feet (R26.81)    Time: 6270-3500 PT Time Calculation (min) (ACUTE ONLY): 26 min   Charges:   PT Evaluation $PT Eval Moderate Complexity: 1 Mod          [place], PT Acute Rehabilitation Services Pager: [phone] Office: [phone]   Sandy Salaam Timmya Blazier 04/23/2019, 12:23 PM

## 2019-04-23 NOTE — Care Management Important Message (Signed)
Important Message  Patient Details  Name: Zerek Litsey MRN: [id number] Date of Birth: Nov 17, 1940   Medicare Important Message Given:  Yes     Shelda Altes 04/23/2019, 11:21 AM

## 2019-04-23 NOTE — Progress Notes (Signed)
[place] for Heparin Indication: chest pain/ACS  Allergies  Allergen Reactions  . Bactrim [Sulfamethoxazole-Trimethoprim] Other (See Comments)    Listed on [facility]    Patient Measurements: Height: 6' 2.02" (188 cm) Weight: 179 lb 14.3 oz (81.6 kg) IBW/kg (Calculated) : 82.24  Heparin Dosing Wt: 90.7 kg  Vital Signs: Temp: 97.6 F (36.4 C) (07/21 0842) Temp Source: Oral (07/21 0842) BP: 111/84 (07/21 0842) Pulse Rate: 114 (07/21 0901)  Labs: Recent Labs    04/20/19 1911  04/21/19 0609 04/21/19 0934  04/21/19 1159 04/21/19 1454 04/22/19 0718 04/22/19 1601 04/23/19 0028 04/23/19 0814  HGB 11.7*  --   --   --   --   --   --  11.4*  --   --  11.2*  HCT 37.9*  --   --   --   --   --   --  36.5*  --   --  35.3*  PLT 321  --   --   --   --   --   --  319  --   --  286  LABPROT 15.1  --   --   --   --   --   --   --   --   --   --   INR 1.2  --   --   --   --   --   --   --   --   --   --   HEPARINUNFRC  --   --   --   --    < > 0.27*  --  0.25* 0.25* 0.37 0.29*  CREATININE 1.69*  --  1.66*  --   --   --   --  1.27*  --   --   --   TROPONINIHS  --    < > 3,566* 4,179*  --  4,027* 4,159*  --   --   --   --    < > = values in this interval not displayed.    Estimated Creatinine Clearance: 57.1 mL/min (A) (by C-G formula based on SCr of 1.27 mg/dL (H)).  Assessment: 78 yr old male presenting with SOB and fever, EKG changes showing STEMI. He continues on IV heparin. Heparin level is now slightly below goal. No bleeding noted. Cardiology planning medical management with 72 hours of heparin. Also with new afib.   Goal of Therapy:  Heparin level 0.3-0.7 units/ml Monitor platelets by anticoagulation protocol: Yes   Plan:  Increase heparin to 1600 units/hr Daily heparin level and CBC F/u planned LOT   Salome Arnt, PharmD, BCPS Please see AMION for all pharmacy numbers 04/23/2019 10:18 AM

## 2019-04-23 NOTE — NC FL2 (Signed)
[place] MEDICAID FL2 LEVEL OF CARE SCREENING TOOL     IDENTIFICATION  Patient Name: Luis Lyons Birthdate: [birth year] Sex: male Admission Date (Current Location): 04/20/2019  [facility] and [place] Number:  Herbalist and Address:  [facility] [place]. [facility], [place] [address], [place], [place] [address]      Provider Number: [id number]  Attending Physician Name and Address:  Nita Sells, MD  Relative Name and Phone Number:       Current Level of Care: Hospital Recommended Level of Care: [place] Prior Approval Number:    Date Approved/Denied:   PASRR Number:    Discharge Plan: SNF    Current Diagnoses: Patient Active Problem List   Diagnosis Date Noted  . Sepsis due to pneumonia ([place]) 04/20/2019  . Acute respiratory failure with hypoxia ([place]) 04/20/2019  . History of CVA (cerebrovascular accident) 04/20/2019  . Hyperglycemia 04/20/2019  . Hypokalemia 04/20/2019  . Renal insufficiency 04/20/2019  . Prolonged QT interval 04/20/2019  . HCAP (healthcare-associated pneumonia)   . CAP (community acquired pneumonia) 08/29/2018  . Essential hypertension 08/29/2018  . Acute bronchitis 08/29/2018  . SBO (small bowel obstruction) ([place]) 08/28/2018    Orientation RESPIRATION BLADDER Height & Weight     Self, Place, Situation  O2(Nasal Cannula 3L) Incontinent Weight: 179 lb 14.3 oz (81.6 kg) Height:  6' 2.02" (188 cm)  BEHAVIORAL SYMPTOMS/MOOD NEUROLOGICAL BOWEL NUTRITION STATUS      Incontinent Diet(DYS 2 diet, nectar thick liquids)  AMBULATORY STATUS COMMUNICATION OF NEEDS Skin   Extensive Assist Verbally Normal                       Personal Care Assistance Level of Assistance  Bathing, Feeding, Dressing Bathing Assistance: Maximum assistance Feeding assistance: Limited assistance Dressing Assistance: Maximum assistance     Functional Limitations Info  Sight, Speech, Hearing Sight Info: Adequate Hearing Info:  Adequate Speech Info: Adequate    SPECIAL CARE FACTORS FREQUENCY  PT (By licensed PT), OT (By licensed OT)     PT Frequency: 5x OT Frequency: 5x            Contractures Contractures Info: Not present    Additional Factors Info  Code Status, Allergies Code Status Info: DNR Allergies Info: Bactrim (Sulfamethoxazole-trimethoprim)           Current Medications (04/23/2019):  This is the current hospital active medication list Current Facility-Administered Medications  Medication Dose Route Frequency Provider Last Rate Last Dose  . acetaminophen (TYLENOL) tablet 650 mg  650 mg Oral Q6H PRN Opyd, Ilene Qua, MD       Or  . acetaminophen (TYLENOL) suppository 650 mg  650 mg Rectal Q6H PRN Opyd, Ilene Qua, MD      . amiodarone (NEXTERONE PREMIX) 360-4.14 MG/200ML-% (1.8 mg/mL) IV infusion  60 mg/hr Intravenous Continuous Reino Bellis B, NP 33.3 mL/hr at 04/23/19 1031 60 mg/hr at 04/23/19 1031   Followed by  . amiodarone (NEXTERONE PREMIX) 360-4.14 MG/200ML-% (1.8 mg/mL) IV infusion  30 mg/hr Intravenous Continuous Reino Bellis B, NP      . aspirin chewable tablet 81 mg  81 mg Oral Daily Rumbarger, Valeda Malm, RPH   81 mg at 04/23/19 0901  . atorvastatin (LIPITOR) tablet 80 mg  80 mg Oral q1800 Arnoldo Lenis, MD   80 mg at 04/22/19 1813  . carvedilol (COREG) tablet 3.125 mg  3.125 mg Oral BID WC Leonie Man, MD   3.125 mg at  04/23/19 0901  . heparin ADULT infusion 100 units/mL (25000 units/234mL sodium chloride 0.45%)  1,600 Units/hr Intravenous Continuous Rumbarger, Valeda Malm, RPH 16 mL/hr at 04/23/19 1038 1,600 Units/hr at 04/23/19 1038  . insulin aspart (novoLOG) injection 0-9 Units  0-9 Units Subcutaneous Q4H Opyd, Ilene Qua, MD   1 Units at 04/23/19 1306  . levalbuterol (XOPENEX) nebulizer solution 0.63 mg  0.63 mg Nebulization Q8H PRN Lovey Newcomer T, NP   0.63 mg at 04/22/19 0440  . losartan (COZAAR) tablet 25 mg  25 mg Oral Daily Leonie Man, MD   25 mg at  04/23/19 0901  . meropenem (MERREM) 1,000 mg in sodium chloride 0.9 % 100 mL IVPB  1,000 mg Intravenous Q8H Samtani, Jai-Gurmukh, MD      . polyethylene glycol (MIRALAX / GLYCOLAX) packet 17 g  17 g Oral Daily PRN Opyd, Ilene Qua, MD      . Resource ThickenUp Clear   Oral PRN Nita Sells, MD      . sodium chloride flush (NS) 0.9 % injection 3 mL  3 mL Intravenous Q12H Opyd, Ilene Qua, MD   3 mL at 04/23/19 1100  . vancomycin (VANCOCIN) IVPB 750 mg/150 ml premix  750 mg Intravenous Q12H Bertis Ruddy, RPH 150 mL/hr at 04/22/19 2338 750 mg at 04/22/19 2338     Discharge Medications: Please see discharge summary for a list of discharge medications.  Relevant Imaging Results:  Relevant Lab Results:   Additional Information [id number]  Eileen Stanford, LCSW

## 2019-04-23 NOTE — Progress Notes (Addendum)
PROGRESS NOTE  Ramiel Forti VOH:[id number] DOB: 03-25-1941 DOA: 04/20/2019 PCP: Elson Clan, MD  Brief History   78 year old Caucasian male skilled nursing home resident Prior CVA?  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  UC on admit have grown multiple colonies including ESBL--he also has gr+ staph  On BC x 2--poor overall prognosis---will need pallaitve to follow at SNF  A & P  Aspiration pneumonia Hypoxic respiratory failure secondary to combination of above and saline resuscitation for sepsis Speech therapy saw appreciate input dysphagia 2 diet Blood cultures growing gram-positive cocci in 2 bottles keep vancomycin on board, de-escalate/stop on 7/24 Saline lock, given a dose of Lasix 7/19, repeat dose 7/20--hypoxia resolved--- decreased to +800 Desat screen when able Code STEMI called early morning 7/19 New onset atrial fibrillation 7/20 p.m. likely secondary to myocardial infarction Cardiac event/MI 7/19-poor candidate for intervention Medical management-Coreg 3.125 twice daily, losartan 25, Lipitor 80 Continues on IV heparin-timing of DC heparin and outpatient ACS management recommendations per cardiology Cardiology loading with amiodarone 7/21-is rate controlled at this time Prostate cancer status post prostatectomy- Sees [facility] Dr. Durwin Reges ESBL E. 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 Cardiology  Procedures  . Chest x-ray 1 view . Repeat chest x-ray 7/20  Antibiotics  . Vancomycin and cefepime  Interval History/Subjective    Looks fine no distress eating and drinking seems more awake and alert than previously No chest pain No vomiting Note that flipped into atrial fibrillation last p.m.  Objective   Vitals:  Vitals:   04/23/19 0842 04/23/19 0901  BP: 111/84   Pulse: 99 (!) 114  Resp: (!) 32   Temp: 97.6 F (36.4 C)   SpO2: 100%     Exam:  More coherent asking me if I spoke to his daughter Chest clear no added sound no rales no egophony Poor dentition postop changes to mandible Neck soft supple cannot appreciate JVD S1-S2 no murmur sinus rhythm on monitors Abdomen soft, midline abdominal scar to mons pubis No lower extremity edema Neurologically intact no focal deficit  I have personally reviewed the following:   Today's Data  .   Lab Data   . BUN/creatinine d 24/1.6 down to-->25/1.27 . WBC 27--->11   Micro Data  . Await Legionella . Urine culture growing ESBL E. coli and Proteus . Blood culture X2 gram-positive cocci still pending  Imaging  . X-ray as above . Repeat chest x-ray 7/20 shows some clearing of infiltrates although  my over read of x-ray does not really show infiltrates to begin with  Cardiology Data  . EKG shows ST-T wave  changes V1 through these 3  Other Data  . None  Scheduled Meds: . aspirin  81 mg Oral Daily  . atorvastatin  80 mg Oral q1800  . carvedilol  3.125 mg Oral BID WC  . insulin aspart  0-9 Units Subcutaneous Q4H  . losartan  25 mg Oral Daily  . sodium chloride flush  3 mL Intravenous Q12H   Continuous Infusions: . amiodarone 60 mg/hr (04/23/19 1031)   Followed by  . amiodarone    . cefTRIAXone (ROCEPHIN)  IV 2 g (04/22/19 1819)  . heparin 1,600 Units/hr (04/23/19 1038)  . vancomycin 750 mg (04/22/19 2338)    Principal Problem:   Sepsis due to pneumonia [facility]) Active Problems:   Acute respiratory failure with hypoxia (HCC)   History of CVA (cerebrovascular accident)   Hyperglycemia   Hypokalemia   Renal insufficiency   Prolonged QT interval   LOS: 3 days   How to contact the [facility] Attending or Consulting provider [place] or covering provider during after hours [place], for this patient?  1. Check the care team in [facility] and look for a) attending/consulting TRH provider listed and b) the [facility] team listed 2. Log into [email or web address] and use [facility] universal password to access. If you do not have the password, please contact the hospital operator. 3. Locate the [facility] provider you are looking for under [facility] and page to a number that you can be directly reached. 4. If you still have difficulty reaching the provider, please page the [facility] (Director on Call) for the Hospitalists listed on amion for assistance.

## 2019-04-23 NOTE — Progress Notes (Signed)
[place] for Heparin> warfarin Indication: chest pain/ACS/new Afib  Allergies  Allergen Reactions  . Bactrim [Sulfamethoxazole-Trimethoprim] Other (See Comments)    Listed on [facility]    Patient Measurements: Height: 6' 2.02" (188 cm) Weight: 179 lb 14.3 oz (81.6 kg) IBW/kg (Calculated) : 82.24  Heparin Dosing Wt: 90.7 kg  Vital Signs: Temp: 97.5 F (36.4 C) (07/21 1625) Temp Source: Oral (07/21 1625) BP: 122/65 (07/21 1625) Pulse Rate: 84 (07/21 1625)  Labs: Recent Labs    04/20/19 1911  04/21/19 0609 04/21/19 0934  04/21/19 1159 04/21/19 1454 04/22/19 0718 04/22/19 1601 04/23/19 0028 04/23/19 0814  HGB 11.7*  --   --   --   --   --   --  11.4*  --   --  11.2*  HCT 37.9*  --   --   --   --   --   --  36.5*  --   --  35.3*  PLT 321  --   --   --   --   --   --  319  --   --  286  LABPROT 15.1  --   --   --   --   --   --   --   --   --   --   INR 1.2  --   --   --   --   --   --   --   --   --   --   HEPARINUNFRC  --   --   --   --    < > 0.27*  --  0.25* 0.25* 0.37 0.29*  CREATININE 1.69*  --  1.66*  --   --   --   --  1.27*  --   --   --   TROPONINIHS  --    < > 3,566* 4,179*  --  4,027* 4,159*  --   --   --   --    < > = values in this interval not displayed.    Estimated Creatinine Clearance: 57.1 mL/min (A) (by C-G formula based on SCr of 1.27 mg/dL (H)).  Assessment: 78 yr old male presenting with SOB and fever, EKG changes showing STEMI. He continues on IV heparin. Heparin level is now slightly below goal. No bleeding noted. Cardiology planning medical management with 72 hours of heparin. Also with new afib.   Adding warfarin for oral AC  DI with amiodarone   Goal of Therapy:  Heparin level 0.3-0.7 units/ml Monitor platelets by anticoagulation protocol: Yes   Plan:  Warfarin 5mg  x1 tonight Continue  heparin to 1600 units/hr Daily heparin level and CBC, INR Monitor s/s bleeding  Bonnita Nasuti Pharm.D. CPP,  BCPS Clinical Pharmacist [phone] 04/23/2019 5:45 PM

## 2019-04-23 NOTE — Progress Notes (Signed)
  Speech Language Pathology Treatment: Dysphagia  Patient Details Name: Luis Lyons MRN: [id number] DOB: [birth year] Today's Date: 04/23/2019 Time: 3976-7341 SLP Time Calculation (min) (ACUTE ONLY): 28 min  Assessment / Plan / Recommendation Clinical Impression  Pt needed Mod cues during breakfast meal for use of chin tuck, which is more of a precaution with nectar thick liquids. He is likely not ready to advance to thin liquids, which would required more consistent use of this strategy. He tried initially to use a chin tuck with cup sips, with improved accuracy noted with SLP introducing a straw. SLP also provided Min cues for oral holding and additionally for clearance of oral residue at the back of his tongue. A liquid wash worked well. He had a delayed coughing episode x1, when another provider had asked him to take a deep breath to listen to his lung sounds. This could be unrelated, although it could also represent decreased airway protection with suspected pharyngeal residuals. Therefore, HOB was left elevated upon completion of session. Recommend continuing current diet and precautions.    HPI HPI: Pt is a 78 yo male admitted from SNF with CXR concerning for PNA and/or aspiration. Code STEMI was called early morning on 7/19. He has a h/o oral cancer s/p mandibulectomy and XRT (2014), for which he had a PEG that was removed after tx. He has been consuming soft solids (missing bottom dentition) and thin liquids PTA. BSE in December 2019 recommended regular solids and thin liquids with pt managing chronic dysphagia seemingly well. PMH also includes: prostate ca, CVA 2013, HTN, dry mouth, self-reported GER      SLP Plan  Continue with current plan of care       Recommendations  Diet recommendations: Dysphagia 2 (fine chop);Nectar-thick liquid Liquids provided via: Straw Medication Administration: Crushed with puree Supervision: Patient able to self feed;Full supervision/cueing for  compensatory strategies Compensations: Minimize environmental distractions;Slow rate;Small sips/bites;Use straw to facilitate chin tuck Postural Changes and/or Swallow Maneuvers: Seated upright 90 degrees;Upright 30-60 min after meal                Oral Care Recommendations: Oral care BID;Other (Comment)(clear oral cavity after PO intake) Follow up Recommendations: Skilled Nursing facility SLP Visit Diagnosis: Dysphagia, oropharyngeal phase (R13.12) Plan: Continue with current plan of care       GO                Venita Sheffield Alphonsa Brickle 04/23/2019, 9:34 AM  Pollyann Glen, M.A. [place] Acute Environmental education officer [phone] Office [phone]

## 2019-04-23 NOTE — Progress Notes (Addendum)
Progress Note  Patient Name: Luis Lyons Date of Encounter: 04/23/2019  Primary Cardiologist: No primary care provider on file.   Subjective   Developed afib sometime yesterday afternoon. No chest pain, sitting up in bed with PT assisting to eat breakfast.   Inpatient Medications    Scheduled Meds: . aspirin  81 mg Oral Daily  . atorvastatin  80 mg Oral q1800  . carvedilol  3.125 mg Oral BID WC  . insulin aspart  0-9 Units Subcutaneous Q4H  . losartan  25 mg Oral Daily  . sodium chloride flush  3 mL Intravenous Q12H   Continuous Infusions: . cefTRIAXone (ROCEPHIN)  IV 2 g (04/22/19 1819)  . heparin 1,450 Units/hr (04/22/19 2000)  . vancomycin 750 mg (04/22/19 2338)   PRN Meds: acetaminophen **OR** acetaminophen, levalbuterol, polyethylene glycol, Resource ThickenUp Clear   Vital Signs    Vitals:   04/23/19 0101 04/23/19 0418 04/23/19 0842 04/23/19 0901  BP:  136/87 111/84   Pulse:  (!) 112 99 (!) 114  Resp:  (!) 35 (!) 32   Temp: (!) 97.4 F (36.3 C) (!) 97.4 F (36.3 C) 97.6 F (36.4 C)   TempSrc: Oral Oral Oral   SpO2:  100% 100%   Weight:  81.6 kg    Height:        Intake/Output Summary (Last 24 hours) at 04/23/2019 0909 Last data filed at 04/23/2019 0300 Gross per 24 hour  Intake 270 ml  Output 1601 ml  Net -1331 ml   Last 3 Weights 04/23/2019 04/21/2019 04/20/2019  Weight (lbs) 179 lb 14.3 oz 181 lb 14.1 oz 200 lb  Weight (kg) 81.6 kg 82.5 kg 90.719 kg      Telemetry    Afib rates elevated in the 110s-140s - Personally Reviewed  ECG    No new tracing this morning - Personally Reviewed  Physical Exam  Pleasant, frail older WM GEN: No acute distress.   Neck: No JVD Cardiac: Irreg Irreg, no murmurs, rubs, or gallops.  Respiratory: Diminished with scattered rhonchi, coarse.  GI: Soft, nontender, non-distended  MS: No edema; No deformity. Neuro:  dysarthric at times, Nonfocal  Psych: Normal affect   Labs    High Sensitivity Troponin:    Recent Labs  Lab 04/21/19 0609 04/21/19 0934 04/21/19 1159 04/21/19 1454  TROPONINIHS 3,566* 4,179* 4,027* 4,159*      Cardiac EnzymesNo results for input(s): TROPONINI in the last 168 hours. No results for input(s): TROPIPOC in the last 168 hours.   Chemistry Recent Labs  Lab 04/20/19 1911 04/21/19 0609 04/22/19 0718  NA 135 138 138  K 3.4* 4.5 4.4  CL 103 109 106  CO2 19* 19* 25  GLUCOSE 235* 175* 128*  BUN 24* 24* 25*  CREATININE 1.69* 1.66* 1.27*  CALCIUM 9.2 8.6* 9.2  PROT 8.2* 7.4  --   ALBUMIN 2.8* 2.4*  --   AST 26 45*  --   ALT 15 17  --   ALKPHOS 86 67  --   BILITOT 1.2 0.5  --   GFRNONAA 39* 39* 55*  GFRAA 45* 46* >60  ANIONGAP 13 10 7      Hematology Recent Labs  Lab 04/20/19 1911 04/22/19 0718  WBC 22.8* 27.7*  RBC 4.32 4.19*  HGB 11.7* 11.4*  HCT 37.9* 36.5*  MCV 87.7 87.1  MCH 27.1 27.2  MCHC 30.9 31.2  RDW 13.7 14.1  PLT 321 319    BNPNo results for input(s): BNP, PROBNP in the last  168 hours.   DDimer No results for input(s): DDIMER in the last 168 hours.   Radiology    Dg Chest Port 1 View  Result Date: 04/22/2019 CLINICAL DATA:  Shortness of breath EXAM: PORTABLE CHEST 1 VIEW COMPARISON:  04/22/2019 FINDINGS: Heart is borderline in size. Aortic atherosclerosis. Patchy opacities lung bases, improved since prior study. No effusions or acute bony abnormality. IMPRESSION: Patchy bibasilar airspace opacities, improving since prior study. Electronically Signed   By: Rolm Baptise M.D.   On: 04/22/2019 09:06   Dg Chest Port 1 View  Result Date: 04/22/2019 CLINICAL DATA:  Shortness of breath EXAM: PORTABLE CHEST 1 VIEW COMPARISON:  Two days ago FINDINGS: New Kerley lines. Asymmetric airspace density at the medial right base. No effusion or pneumothorax. Cardiomegaly. IMPRESSION: 1. New interstitial edema. 2. Continued concern for right base pneumonia. Electronically Signed   By: Monte Fantasia M.D.   On: 04/22/2019 05:16    Cardiac Studies    TTE: 04/21/19  IMPRESSIONS    1. The left ventricle has severely reduced systolic function, with an ejection fraction of 25-30%. The cavity size was normal. There is mildly increased left ventricular wall thickness. Left ventricular diastolic Doppler parameters are consistent with  pseudonormalization. Elevated mean left atrial pressure.  2. The right ventricle has normal systolic function. The cavity was normal.  3. The mitral valve is abnormal. There is severe mitral annular calcification present.  4. The aortic root is normal in size and structure.  5. The aortic valve was not well visualized. No stenosis of the aortic valve.  6. Trivial pericardial effusion is present.  7. The tricuspid valve is grossly normal.  8. Akinesis of the anteroseptal wall and apex with overall severe LV dysfunction; moderate diastolic dysfunction; mild LVH; cannot R/O apical thrombus; possible density in pericardial space most prominent on apical 4 chamber views; suggest cardiac MRI  to further assess LV apex and pericardial space.  TTE: 04/22/19  IMPRESSIONS    1. Limited study with definity to evaluate LV function; technically difficult; apical akinesis; overall moderate LV dysfunction; doppler not performed.  2. The left ventricle has moderately reduced systolic function, with an ejection fraction of 35-40%.  Patient Profile     78 y.o. male former smoker and nursing home resident with a history of HTN, CVA with residual left sided weakness, who presented with fever and hypoxic respiratory failure in the setting of presumed bacterial pneumonia. Routine EKG yesterday morning revealed 2-3 mm ST elevations in the anterolateral leads (V3-V6) with subtle reciprocal changes inferiorly >> cardiology is consulted>>reviewed with Dr. Gwenlyn Found (interventionalist on call) who felt that given his comorbidities medical conditions, clinical improvement, and lack of symptoms>> medical therapy.   Assessment & Plan     1. STEMI: Repeat EKG with evolving ST changes in the anterior leads. No chest pain overnight.  -- Complete Echo showed severely reduced LVEF at 25-30%; akinesis of the anteroseptal wall and apex with overall severe LV dysfunction; moderate diastolic dysfunction; mild LVH;. Limited with echo with no apical thrombus noted. -- Plan for heparin, 72 hrs total -- continue with medical therapy as he is not a candidate for invasive therapy given chronic co-morbities.  ASA, statin and BB (switched to coreg 3.125mg  BID yesterday) -- with new Afib, will hold on adding plavix for now.   2. Chronic combined CHF:  -- Euvolemic on exam. LVEF of 25-35% with anteroapical WM abnormality, limited echo with slight improvement of LV to 35-40%.  -- on coreg  3.125mg  BID and losartan 25mg  daily.  3. HTN:  -- BP improved. Tolerating coreg and ARB addition.   4. Sepsis/ Acute respiratory failure/ multifocal PNA: - Per primary  - COVID negative  5. AKI: - Scr improving 1.69>>1.66>>1.27  6. New onset Afib RVR: Developed sometime yesterday afternoon. Rates are elevated into the 140s at times. Tolerating the addition of coreg yesterday afternoon. Has been on IV heparin since admission.  -- start IV amiodarone bolus with drip and continue IV heparin. Plan for amiodarone short term.  -- continue coreg -- suspect Afib 2/2 to underlying infection and recent MI. This patients CHA2DS2-VASc Score of at least 7.       Signed, Reino Bellis, NP  04/23/2019, 9:09 AM     ATTENDING ATTESTATION  I have seen, examined and evaluated the patient this AM along with Reino Bellis, NP-C.  After reviewing all the available data and chart, we discussed the patients laboratory, study & physical findings as well as symptoms in detail. I agree with her findings, examination as well as impression recommendations as per our discussion.    Attending adjustments noted in italics.   Mr. Bannan looks relatively stable this morning.   Very difficult to assess if he is having any issues.  He does not seem to notice his heart rate being irregular in A. fib. The presence of A. fib answers the question about whether or not we will continue anticoagulation and using warfarin.  I think initiating warfarin would be reasonable.  I also think that we should consider attempted cardioversion with amiodarone.  This would certainly not be for long-term however.  With blood pressure seems to be doing okay with carvedilol and losartan.  Would not further titrate at this time until he is out of A. Fib. Does not seem to be having any signs or symptoms of congestive heart failure despite reduced EF. Relatively euvolemic.  Hold off on diuretic now, may consider spironolactone.  Basically completed anterior infarct.  At this time I do not know that ischemic evaluation would be beneficial as it seems like he has pretty dense wall motion normality.  Thankfully however the limited echocardiogram would suggest the EF being a little better than was initially described in the original echo.  As he recovers from sepsis and pneumonia, we can consider further titrating medications.  However the presence of A. fib will likely limit our being able to titrate further at this point.    Glenetta Hew, M.D., M.S. Interventional Cardiologist   Pager # [phone] Phone # [phone] [address] [place], [place] [address]    For questions or updates, please contact [place] Please consult [email or web address] for contact info under

## 2019-04-23 NOTE — Evaluation (Signed)
Occupational Therapy Evaluation Patient Details Name: Luis Lyons MRN: [id number] DOB: [birth year] Today's Date: 04/23/2019    History of Present Illness 78 yo SNF resident admitted with aspiration PNA, respiratory failure, sepsis with STEMI 7/19. PMhx: prostate CA, mouth CA s/p mandibulectomy, HTN, CVA with right weakness   Clinical Impression   Pt currently requires mod A +2 for  Bed mobility to sit EOB and min A initially for balance increasing to min guard A after a minute. Pt required total A to donn socks and max A with LB ADLs seated EOB. Pt able to wash face and hands seated EOB with min guard A and comb hair with min A. Pt is a LTC resident of a SNF and gives conflicting info about PLOF, so uncertain at this time what baseline is. Pt will return to SNF when medically stable. All education is completed and no further acute OT is indicated at this time. Any further OT intervention is deferred to the SNF    Follow Up Recommendations  SNF    Equipment Recommendations  None recommended by OT    Recommendations for Other Services       Precautions / Restrictions Precautions Precautions: Fall Precaution Comments: right weakness Restrictions Weight Bearing Restrictions: No      Mobility Bed Mobility Overal bed mobility: Needs Assistance Bed Mobility: Supine to Sit     Supine to sit: Mod assist;+2 for physical assistance     General bed mobility comments: mod assist to help move legs off of bed and elevate trunk with 2 person assist for safety and to fully pivot to EOB  Transfers Overall transfer level: Needs assistance   Transfers: Sit to/from Stand;Stand Pivot Transfers Sit to Stand: Mod assist;+2 physical assistance Stand pivot transfers: Mod assist;+2 physical assistance       General transfer comment: Mod +2 assist with cues for hand placement and assist to perform UE assist on therapist arms. Pt stood x 3 trials from bed with ability to slow shift weight with  assist to pivot to chair to left    Balance Overall balance assessment: Needs assistance Sitting-balance support: Feet supported;Single extremity supported Sitting balance-Leahy Scale: Fair Sitting balance - Comments: pt initially min assist for sitting balance but able to achieve minguard for several minutes   Standing balance support: Bilateral upper extremity supported Standing balance-Leahy Scale: Poor Standing balance comment: bil UE support and assist in standing                           ADL either performed or assessed with clinical judgement   ADL Overall ADL's : Needs assistance/impaired Eating/Feeding: Supervision/ safety;Set up;Sitting   Grooming: Wash/dry hands;Wash/dry face;Min guard;Sitting;Brushing hair;Minimal assistance   Upper Body Bathing: Maximal assistance   Lower Body Bathing: Total assistance   Upper Body Dressing : Maximal assistance   Lower Body Dressing: Total assistance     Toilet Transfer Details (indicate cue type and reason): mod A +2 for SPT simulated to recliner Toileting- Clothing Manipulation and Hygiene: Total assistance         General ADL Comments: pt is a LTC resident of a SNF and uncertain of PLOF as pt giving conflicitng reports     Vision Patient Visual Report: No change from baseline       Perception     Praxis      Pertinent Vitals/Pain Pain Assessment: Faces Faces Pain Scale: Hurts little more Pain Location: scrotum with mobility Pain Descriptors /  Indicators: Sore Pain Intervention(s): Limited activity within patient's tolerance;Monitored during session;Repositioned     Hand Dominance Right   Extremity/Trunk Assessment Upper Extremity Assessment Upper Extremity Assessment: Generalized weakness;RUE deficits/detail   Lower Extremity Assessment Lower Extremity Assessment: Generalized weakness;RLE deficits/detail;LLE deficits/detail(grossly 2/5 Bil LE strength)   Cervical / Trunk Assessment Cervical /  Trunk Assessment: Kyphotic   Communication Communication Communication: Other (comment)(impaired due madibulectomy)   Cognition Arousal/Alertness: Awake/alert Behavior During Therapy: WFL for tasks assessed/performed Overall Cognitive Status: No family/caregiver present to determine baseline cognitive functioning                                 General Comments: pt with highly varied reports of PLOF. Pt oriented to place and self   General Comments       Exercises     Shoulder Instructions      Home Living Family/patient expects to be discharged to:: Skilled nursing facility                                 Additional Comments: pt has been at [facility] for 2 years per pt      Prior Functioning/Environment Level of Independence: Needs assistance  Gait / Transfers Assistance Needed: per last admission pt stating he transfers to W/C without assist. Today pt with varied report that he doesn't get up, 2 staff get him up, he has a W/c unsure how much he truly moves at SNF ADL's / Homemaking Assistance Needed: requires assistance            OT Problem List:        OT Treatment/Interventions:      OT Goals(Current goals can be found in the care plan section) Acute Rehab OT Goals Patient Stated Goal: none stated OT Goal Formulation: With patient  OT Frequency:     Barriers to D/C:            Co-evaluation PT/OT/SLP Co-Evaluation/Treatment: Yes Reason for Co-Treatment: Complexity of the patient's impairments (multi-system involvement);For patient/therapist safety;To address functional/ADL transfers PT goals addressed during session: Mobility/safety with mobility;Balance OT goals addressed during session: ADL's and self-care;Proper use of Adaptive equipment and DME      AM-PAC OT "6 Clicks" Daily Activity     Outcome Measure                 End of Session    Activity Tolerance:   Patient left:                     Time:  6222-9798 OT Time Calculation (min): 26 min Charges:  OT General Charges $OT Visit: 1 Visit OT Evaluation $OT Eval Moderate Complexity: 1 Mod     Britt Bottom 04/23/2019, 1:02 PM

## 2019-04-24 ENCOUNTER — Inpatient Hospital Stay (HOSPITAL_COMMUNITY): Payer: Medicare Other

## 2019-04-24 DIAGNOSIS — I10 Essential (primary) hypertension: Secondary | ICD-10-CM

## 2019-04-24 DIAGNOSIS — I213 ST elevation (STEMI) myocardial infarction of unspecified site: Secondary | ICD-10-CM

## 2019-04-24 LAB — CULTURE, BLOOD (ROUTINE X 2)
Special Requests: ADEQUATE
Special Requests: ADEQUATE

## 2019-04-24 LAB — CBC WITH DIFFERENTIAL/PLATELET
Abs Immature Granulocytes: 0.03 10*3/uL (ref 0.00–0.07)
Basophils Absolute: 0 10*3/uL (ref 0.0–0.1)
Basophils Relative: 0 %
Eosinophils Absolute: 0.2 10*3/uL (ref 0.0–0.5)
Eosinophils Relative: 2 %
HCT: 33.3 % — ABNORMAL LOW (ref 39.0–52.0)
Hemoglobin: 10.6 g/dL — ABNORMAL LOW (ref 13.0–17.0)
Immature Granulocytes: 0 %
Lymphocytes Relative: 19 %
Lymphs Abs: 1.8 10*3/uL (ref 0.7–4.0)
MCH: 27.4 pg (ref 26.0–34.0)
MCHC: 31.8 g/dL (ref 30.0–36.0)
MCV: 86 fL (ref 80.0–100.0)
Monocytes Absolute: 0.8 10*3/uL (ref 0.1–1.0)
Monocytes Relative: 8 %
Neutro Abs: 7 10*3/uL (ref 1.7–7.7)
Neutrophils Relative %: 71 %
Platelets: 276 10*3/uL (ref 150–400)
RBC: 3.87 MIL/uL — ABNORMAL LOW (ref 4.22–5.81)
RDW: 14 % (ref 11.5–15.5)
WBC: 9.8 10*3/uL (ref 4.0–10.5)
nRBC: 0 % (ref 0.0–0.2)

## 2019-04-24 LAB — COMPREHENSIVE METABOLIC PANEL
ALT: 21 U/L (ref 0–44)
AST: 19 U/L (ref 15–41)
Albumin: 2.3 g/dL — ABNORMAL LOW (ref 3.5–5.0)
Alkaline Phosphatase: 62 U/L (ref 38–126)
Anion gap: 9 (ref 5–15)
BUN: 23 mg/dL (ref 8–23)
CO2: 25 mmol/L (ref 22–32)
Calcium: 9.2 mg/dL (ref 8.9–10.3)
Chloride: 105 mmol/L (ref 98–111)
Creatinine, Ser: 1.07 mg/dL (ref 0.61–1.24)
GFR calc Af Amer: >60 mL/min (ref >60)
GFR calc non Af Amer: >60 mL/min (ref >60)
Glucose, Bld: 115 mg/dL — ABNORMAL HIGH (ref 70–99)
Potassium: 4 mmol/L (ref 3.5–5.1)
Sodium: 139 mmol/L (ref 135–145)
Total Bilirubin: 0.4 mg/dL (ref 0.3–1.2)
Total Protein: 6.7 g/dL (ref 6.5–8.1)

## 2019-04-24 LAB — GLUCOSE, CAPILLARY
Glucose-Capillary: 106 mg/dL — ABNORMAL HIGH (ref 70–99)
Glucose-Capillary: 108 mg/dL — ABNORMAL HIGH (ref 70–99)
Glucose-Capillary: 114 mg/dL — ABNORMAL HIGH (ref 70–99)
Glucose-Capillary: 138 mg/dL — ABNORMAL HIGH (ref 70–99)
Glucose-Capillary: 141 mg/dL — ABNORMAL HIGH (ref 70–99)
Glucose-Capillary: 91 mg/dL (ref 70–99)

## 2019-04-24 LAB — HEPARIN LEVEL (UNFRACTIONATED): Heparin Unfractionated: 0.42 IU/mL (ref 0.30–0.70)

## 2019-04-24 LAB — MAGNESIUM: Magnesium: 2.1 mg/dL (ref 1.7–2.4)

## 2019-04-24 LAB — PROTIME-INR
INR: 1.1 (ref 0.8–1.2)
Prothrombin Time: 14.1 seconds (ref 11.4–15.2)

## 2019-04-24 LAB — TSH: TSH: 1.447 u[IU]/mL (ref 0.350–4.500)

## 2019-04-24 MED ORDER — SPIRONOLACTONE 12.5 MG HALF TABLET
12.5000 mg | ORAL_TABLET | Freq: Every day | ORAL | Status: DC
  Administered 2019-04-24 – 2019-04-25 (×2): 12.5 mg via ORAL
  Filled 2019-04-24 (×3): qty 1

## 2019-04-24 MED ORDER — COUMADIN BOOK
Freq: Once | Status: DC
  Filled 2019-04-24: qty 1

## 2019-04-24 MED ORDER — SODIUM CHLORIDE 0.9% FLUSH
10.0000 mL | Freq: Two times a day (BID) | INTRAVENOUS | Status: DC
  Administered 2019-04-24 – 2019-04-26 (×4): 10 mL
  Administered 2019-04-26: 20 mL
  Administered 2019-04-27: 22:00:00 10 mL
  Administered 2019-04-27: 20 mL
  Administered 2019-04-28: 10 mL

## 2019-04-24 MED ORDER — VANCOMYCIN HCL 10 G IV SOLR
1750.0000 mg | INTRAVENOUS | Status: DC
  Administered 2019-04-24 – 2019-04-27 (×4): 1750 mg via INTRAVENOUS
  Filled 2019-04-24 (×5): qty 1750

## 2019-04-24 MED ORDER — WARFARIN SODIUM 5 MG PO TABS
5.0000 mg | ORAL_TABLET | Freq: Once | ORAL | Status: AC
  Administered 2019-04-24: 5 mg via ORAL
  Filled 2019-04-24: qty 1

## 2019-04-24 MED ORDER — SODIUM CHLORIDE 0.9% FLUSH: 10.0000 mL | INTRAVENOUS | Status: DC | PRN

## 2019-04-24 MED ORDER — FUROSEMIDE 40 MG PO TABS
40.0000 mg | ORAL_TABLET | ORAL | Status: DC
  Administered 2019-04-24 – 2019-04-28 (×3): 40 mg via ORAL
  Filled 2019-04-24 (×4): qty 1

## 2019-04-24 NOTE — Progress Notes (Signed)
Pharmacy Antibiotic Note  Luis Lyons is a 78 y.o. male admitted on 04/20/2019 with sepsis. Presented with acute respiratory distress (transfer from [facility]).  Pharmacy has been consulted for vancomycin dosing.  WBC 9.8, afebrile. Scr down from 1.69 to 1.07 now (CrCl ~68 mL/min).   Plan: Change Vancomycin to 1750 mg IV every 24 hours Monitor renal fx, cx results, clinical pic, and vanc levels as appropriate.  Height: 6' 2.02" (188 cm) Weight: 179 lb 14.3 oz (81.6 kg) IBW/kg (Calculated) : 82.24  Temp (24hrs), Avg:97.8 F (36.6 C), Min:97.5 F (36.4 C), Max:98.2 F (36.8 C)  Recent Labs  Lab 04/20/19 1911 04/20/19 2329 04/21/19 0609 04/22/19 0718 04/23/19 0814 04/24/19 0349  WBC 22.8*  --   --  27.7* 11.9* 9.8  CREATININE 1.69*  --  1.66* 1.27*  --  1.07  LATICACIDVEN 2.1* 1.2  --   --   --   --     Estimated Creatinine Clearance: 67.8 mL/min (by C-G formula based on SCr of 1.07 mg/dL).    Allergies  Allergen Reactions  . Bactrim [Sulfamethoxazole-Trimethoprim] Other (See Comments)    Listed on MAR    Antimicrobials this admission: Meropenem 7/21 >>  Vanco 7/18 >>  Zosyn 7/18 >> 7/19 Ceftriaxone 7/19 >>7/21  Microbiology results: 7/18 BCx: GPC  7/18 BCID: MRSE 7/18 UCx: 100K ESBL Ecoli + 50K proteus 7/18 COVID PCR: neg 7/19 MRSA PCR: neg  Richardine Service, PharmD PGY1 Pharmacy Resident Phone: [phone] 04/24/2019  12:29 PM  Please check [email or web address] for unit-specific pharmacy phone numbers.

## 2019-04-24 NOTE — Progress Notes (Addendum)
PROGRESS NOTE  Luis Lyons AYT:[id number] DOB: 1940-12-28 DOA: 04/20/2019 PCP: Elson Clan, MD  Brief History   78 year old Caucasian male skilled nursing home resident Prior CVA?  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  UC on admit have grown multiple colonies including ESBL--he also has staph hominis On BC x 2--poor overall prognosis---will need palliative to follow at SNF  A & P  Aspiration pneumonia Hypoxic respiratory failure secondary to combination of above and saline resuscitation for sepsis Staphylococcus hominis bacteremia Speech therapy saw appreciate input dysphagia 2 diet keep vancomycin on board, repeat surveillance cultures 7/22, de-escalate/stop on 7/24 (7 days) if repeat cultures [-] Lasix given 7/19-7/20-I's/O even Giving oral Lasix 401 today and would dose from here on out every other day-no consolidation on x-ray Code STEMI called early morning 7/19 New onset atrial fibrillation [place] score 7 on 7/20 p.m. likely secondary to myocardial infarction-converted to NSR 7/22 Cardiac event/MI 7/19-poor candidate for intervention Current meds-Coreg 3.125 twice daily, losartan 25, Lipitor 80, Aldactone 12.5 Converted to sinus rhythm early a.m. 7/22-transition heparin bridge-->warfarin Cardiology changed Plavix to aspirin-watch blood counts if stable can continue Plavix and warfarin Prostate cancer status post prostatectomy- Sees  [facility] Dr. Durwin Reges ESBL E. coli, Proteus on INO cath 7/17 Changing ceftriaxone to meropenem stop date 7/24 Prior CVA 2013 No overt focal deficits Squamous cell CA mouth status post mandibulectomy, risk for aspiration As above-outpatient follow-up [facility] oncology HTN See above discussion   DVT prophylaxis:  currently on aspirin  heparin bridge to Coumadin-7/22 Code Status: DNR confirmed Family Communication: long discussion with Karena Addison dee daughter on phone on 7/22. Disposition Plan: need hospice and palliative care when d/c to snf--likely can be at the end of week once [facility] neg   Verneita Griffes, MD [facility] 2:36 PM  04/24/2019, 2:36 PM  LOS: 4 days   Consultants  . Cardiology  Procedures  . Chest x-ray 1 view . Repeat chest x-ray 7/20  Antibiotics  . Vancomycin and cefepime  Interval History/Subjective   Sitting up not eating much 50% of meal entry More coherent-not many questions No fevers No cough noted by sitter who is feeding him however patient tells me the food "backed up in his mouth" yesterday   Objective   Vitals:  Vitals:   04/24/19 0833 04/24/19 1215  BP:  (!) 143/64  Pulse: 85 74  Resp: (!) 37 (!) 26  Temp: 97.9 F (36.6 C) 97.8 F (36.6 C)  SpO2: 99% 96%    Exam:  Coherent Chest clear-no egophony no fremitus however some rales Poor dentition postop changes to mandible Neck soft supple, cannot appreciate JVD S1-S2 no murmur sinus rhythm on monitors Abdomen soft, midline abdominal scar to mons pubis No lower extremity edema Neurologically intact no focal deficit  I have personally reviewed the following:   Today's Data  .   Lab Data   . BUN/creatinine d 24/1.6 down to-->25/1.27-->23/1.0 . WBC 27--->11-->9.8 . INR 1.1 . TSH 1.44   Micro Data  . Await Legionella . Urine culture growing ESBL E. coli and  Proteus . Staph hominis on BCx 2  Imaging  . X-ray as above . Repeat chest x-ray 7/20 shows some clearing of  infiltrates although my over read of x-ray does not really show infiltrates to begin with . CXR my read 7/22 shows no distinct changes from prior x-ray with hyper infiltration and may be some atelectasis on the right side  Cardiology Data  . EKG shows ST-T wave changes V1 through these 3  Other Data  . None  Scheduled Meds: . aspirin  81 mg Oral Daily  . atorvastatin  80 mg Oral q1800  . carvedilol  3.125 mg Oral BID WC  . coumadin book   Does not apply Once  . furosemide  40 mg Oral QODAY  . insulin aspart  0-9 Units Subcutaneous Q4H  . losartan  25 mg Oral Daily  . sodium chloride flush  3 mL Intravenous Q12H  . spironolactone  12.5 mg Oral Daily  . warfarin  5 mg Oral ONCE-1800  . Warfarin - Pharmacist Dosing Inpatient   Does not apply q1800   Continuous Infusions: . heparin 1,600 Units/hr (04/24/19 0537)  . meropenem (MERREM) IV 1,000 mg (04/24/19 1253)  . vancomycin Stopped (04/24/19 1352)    Principal Problem:   Sepsis due to pneumonia [facility]) Active Problems:   Acute respiratory failure with hypoxia (HCC)   History of CVA (cerebrovascular accident)   Hyperglycemia   Hypokalemia   Renal insufficiency   Prolonged QT interval   LOS: 4 days   How to contact the [facility] Attending or Consulting provider [place] or covering provider during after hours [place], for this patient?  1. Check the care team in [facility] and look for a) attending/consulting TRH provider listed and b) the [facility] team listed 2. Log into [email or web address] and use [place]'s universal password to access. If you do not have the password, please contact the hospital operator. 3. Locate the [facility] provider you are looking for under [facility] and page to a number that you can be directly reached. 4. If you still have difficulty reaching the provider, please page the [facility] (Director on Call) for the Hospitalists listed on amion for assistance.

## 2019-04-24 NOTE — Progress Notes (Signed)
[place] for Heparin > warfarin Indication: chest pain/ACS/new Afib  Allergies  Allergen Reactions  . Bactrim [Sulfamethoxazole-Trimethoprim] Other (See Comments)    Listed on [facility]    Patient Measurements: Height: 6' 2.02" (188 cm) Weight: 179 lb 14.3 oz (81.6 kg) IBW/kg (Calculated) : 82.24  Heparin Dosing Wt: 90.7 kg  Vital Signs: Temp: 97.9 F (36.6 C) (07/22 0500) Temp Source: Oral (07/22 0500) BP: 155/77 (07/22 0805) Pulse Rate: 86 (07/22 0805)  Labs: Recent Labs    04/21/19 0934  04/21/19 1159 04/21/19 1454  04/22/19 0718  04/23/19 0028 04/23/19 0814 04/24/19 0349  HGB  --   --   --   --    < > 11.4*  --   --  11.2* 10.6*  HCT  --   --   --   --   --  36.5*  --   --  35.3* 33.3*  PLT  --   --   --   --   --  319  --   --  286 276  LABPROT  --   --   --   --   --   --   --   --   --  14.1  INR  --   --   --   --   --   --   --   --   --  1.1  HEPARINUNFRC  --    < > 0.27*  --   --  0.25*   < > 0.37 0.29* 0.42  CREATININE  --   --   --   --   --  1.27*  --   --   --  1.07  TROPONINIHS 4,179*  --  4,027* 4,159*  --   --   --   --   --   --    < > = values in this interval not displayed.    Estimated Creatinine Clearance: 67.8 mL/min (by C-G formula based on SCr of 1.07 mg/dL).  Assessment: 78 yr old male presenting with SOB and fever. EKG changes showing STEMI. Patient was being medically managed as he is not a candidate for interventional therapy. New afib on 7/20, started amiodarone short-term. No anticoagulation PTA. Pharmacy consulted to dose warfarin with heparin bridge.  INR (1.1) subtherapeutic after one dose of warfarin. Heparin level (0.42) therapeutic at drip rate 1600 units/hr. CBC stable. No bleeding noted.  Goal of Therapy:  Heparin level 0.3-0.7 units/ml Monitor platelets by anticoagulation protocol: Yes   Plan:  Warfarin 5mg  x1 tonight Continue  heparin to 1600 units/hr Daily heparin level and CBC,  INR Monitor s/s bleeding  Richardine Service, PharmD PGY1 Pharmacy Resident Phone: [phone] 04/24/2019  8:28 AM  Please check [email or web address] for unit-specific pharmacy phone numbers.

## 2019-04-24 NOTE — TOC Initial Note (Signed)
Transition of Care [facility]) - Initial/Assessment Note    Patient Details  Name: Luis Lyons MRN: [id number] Date of Birth: 01/29/1941  Transition of Care [facility]) CM/SW Contact:    Eileen Stanford, LCSW Phone Number: 04/24/2019, 11:23 AM  Clinical Narrative:  Pt is only alert and oriented x2. CSW spoke with pt's daughter [name]. Pt's daughter confirmed pt is from [facility] and the plan would be for her to return at d/c. CSW to confirm with facility.            Expected Discharge Plan: Skilled Nursing Facility Barriers to Discharge: Continued Medical Work up   Patient Goals and CMS Choice Patient states their goals for this hospitalization and ongoing recovery are:: to get mom back to [facility]   Choice offered to / list presented to : Adult Children  Expected Discharge Plan and Services Expected Discharge Plan: [place] In-house Referral: NA Discharge Planning Services: NA Post Acute Care Choice: NA Living arrangements for the past 2 months: [place]: NA          Prior Living Arrangements/Services Living arrangements for the past 2 months: [place] Lives with:: Facility Resident, Self Patient language and need for interpreter reviewed:: Yes Do you feel safe going back to the place where you live?: Yes      Need for Family Participation in Patient Care: No (Comment) Care giver support system in place?: Yes (comment)(daughter)   Criminal Activity/Legal Involvement Pertinent to Current Situation/Hospitalization: No - Comment as needed  Activities of Daily Living      Permission Sought/Granted Permission sought to share information with : Family Supports, Pharmacist, community Information with NAME: [name]  Permission granted to share info w AGENCY: [facility]  Permission granted to share info w Relationship: Daughter     Emotional  Assessment Appearance:: Appears stated age Attitude/Demeanor/Rapport: Unable to Assess Affect (typically observed): Unable to Assess Orientation: : Oriented to Self, Oriented to Place Alcohol / Substance Use: Not Applicable Psych Involvement: No (comment)  Admission diagnosis:  Hypoxia [R09.02] HCAP (healthcare-associated pneumonia) [J18.9] Acute dyspnea [R06.00] Sepsis due to pneumonia ([place]) [J18.9, A41.9] Patient Active Problem List   Diagnosis Date Noted  . Sepsis due to pneumonia ([place]) 04/20/2019  . Acute respiratory failure with hypoxia ([place]) 04/20/2019  . History of CVA (cerebrovascular accident) 04/20/2019  . Hyperglycemia 04/20/2019  . Hypokalemia 04/20/2019  . Renal insufficiency 04/20/2019  . Prolonged QT interval 04/20/2019  . HCAP (healthcare-associated pneumonia)   . CAP (community acquired pneumonia) 08/29/2018  . Essential hypertension 08/29/2018  . Acute bronchitis 08/29/2018  . SBO (small bowel obstruction) (H[facility]) 08/28/2018   PCP:  Elson Clan, MD Pharmacy:   [facility] [place], [place] [address] [place] [address] Phone: [phone] Fax: [phone]     Social Determinants of Health (SDOH) Interventions    Readmission Risk Interventions No flowsheet data found.

## 2019-04-24 NOTE — Progress Notes (Addendum)
Progress Note  Patient Name: Luis Lyons Date of Encounter: 04/24/2019  Primary Cardiologist: No primary care provider on file.   Subjective   No complaints, sitting up in bed.  Difficult to understand his answers, but does not seem to have any complaints of chest pain or pressure.  No complaints of dyspnea.  Coughing a lot this morning (concern for aspirating)  Converted to sinus rhythm overnight  Inpatient Medications    Scheduled Meds: . aspirin  81 mg Oral Daily  . atorvastatin  80 mg Oral q1800  . carvedilol  3.125 mg Oral BID WC  . insulin aspart  0-9 Units Subcutaneous Q4H  . losartan  25 mg Oral Daily  . sodium chloride flush  3 mL Intravenous Q12H  . warfarin  5 mg Oral ONCE-1800  . Warfarin - Pharmacist Dosing Inpatient   Does not apply q1800   Continuous Infusions: . heparin 1,600 Units/hr (04/24/19 0537)  . meropenem (MERREM) IV 1,000 mg (04/24/19 0357)  . vancomycin 750 mg (04/23/19 2305)   PRN Meds: acetaminophen **OR** acetaminophen, levalbuterol, polyethylene glycol, Resource ThickenUp Clear   Vital Signs    Vitals:   04/24/19 0500 04/24/19 0804 04/24/19 0805 04/24/19 0833  BP:  (!) 155/77 (!) 155/77   Pulse: 79 81 86 85  Resp: (!) 29 (!) 29  (!) 37  Temp: 97.9 F (36.6 C)   97.9 F (36.6 C)  TempSrc: Oral   Oral  SpO2: 97% 97%  99%  Weight:      Height:        Intake/Output Summary (Last 24 hours) at 04/24/2019 0957 Last data filed at 04/24/2019 7371 Gross per 24 hour  Intake 1458.44 ml  Output 1050 ml  Net 408.44 ml   Last 3 Weights 04/23/2019 04/21/2019 04/20/2019  Weight (lbs) 179 lb 14.3 oz 181 lb 14.1 oz 200 lb  Weight (kg) 81.6 kg 82.5 kg 90.719 kg      Telemetry    SR - Personally Reviewed -> converted to sinus rhythm in the evening yesterday.  ECG    No new tracing - Personally Reviewed  Physical Exam   GEN: No acute distress.  Georgianne Fick older WM chronically ill-appearing. Neck: No JVD (difficult to assess because of  anatomy) Cardiac: RRR, no murmurs, rubs, or gallops.  Respiratory: Diminished bilaterally, scattered rhonchi.  No real rales GI: Soft, nontender, non-distended  MS: No edema; No deformity. Neuro:  Nonfocal  Psych: Normal affect   Labs    High Sensitivity Troponin:   Recent Labs  Lab 04/21/19 0609 04/21/19 0934 04/21/19 1159 04/21/19 1454  TROPONINIHS 3,566* 4,179* 4,027* 4,159*      Cardiac EnzymesNo results for input(s): TROPONINI in the last 168 hours. No results for input(s): TROPIPOC in the last 168 hours.   Chemistry Recent Labs  Lab 04/20/19 1911 04/21/19 0609 04/22/19 0718 04/24/19 0349  NA 135 138 138 139  K 3.4* 4.5 4.4 4.0  CL 103 109 106 105  CO2 19* 19* 25 25  GLUCOSE 235* 175* 128* 115*  BUN 24* 24* 25* 23  CREATININE 1.69* 1.66* 1.27* 1.07  CALCIUM 9.2 8.6* 9.2 9.2  PROT 8.2* 7.4  --  6.7  ALBUMIN 2.8* 2.4*  --  2.3*  AST 26 45*  --  19  ALT 15 17  --  21  ALKPHOS 86 67  --  62  BILITOT 1.2 0.5  --  0.4  GFRNONAA 39* 39* 55* >60  GFRAA 45* 46* >60 >60  ANIONGAP _0 Hematology Recent Labs  Lab 04/22/19 0718 04/23/19 0814 04/24/19 0349  WBC 27.7* 11.9* 9.8  RBC 4.19* 4.08* 3.87*  HGB 11.4* 11.2* 10.6*  HCT 36.5* 35.3* 33.3*  MCV 87.1 86.5 86.0  MCH 27.2 27.5 27.4  MCHC 31.2 31.7 31.8  RDW 14.1 14.0 14.0  PLT 319 286 276    BNPNo results for input(s): BNP, PROBNP in the last 168 hours.   DDimer No results for input(s): DDIMER in the last 168 hours.   Radiology    No results found.  Cardiac Studies    TTE: 04/21/19: Severely reduced EF 25 to 30%.  Grade 2 diastolic dysfunction.  Elevated LAP.  Akinesis of the anteroseptal and apical wall.  Cannot rule out apical thrombus..  Severe MAC.   Limited TTE: 04/22/19: Difficult study: EF estimated 35 to 40%.  Apical hypokinesis.   Patient Profile     78 y.o. male former smoker and nursing home resident with a history of HTN, CVA with residual left sided weakness, who  presented with fever and hypoxic respiratory failure in the setting of presumed bacterial pneumonia. Routine EKGyesterdaymorning revealed 2-3 mm ST elevations in the anterolateral leads (V3-V6) with subtle reciprocal changes inferiorly>>cardiology is consulted>>reviewed with Dr. Gwenlyn Found (interventionalist on call) who felt that given his comorbidities medical conditions, clinical improvement, and lack of symptoms>> medical therapy.  Assessment & Plan    1. STEMI: Repeat EKG with evolving ST changes in the anterior leads. No further episodes of chest pain. -- Complete Echo showed severely reduced LVEF at 25-30%; akinesis of the anteroseptal wall and apex with overall severe LV dysfunction; moderate diastolic dysfunction; mild LVH;. Limited with echo with no apical thrombus noted. -- Plan for heparin, 72 hrs total, now transitioning to Coumadin given his Afib. -Continue heparin bridge (could consider converting to Lovenox to conserve volume) -- continue with medical therapy as he is not a candidate for invasive therapy given chronic co-morbities.   ASA, statin and BB (switched to coreg 3.178m BID yesterday) -- with new Afib, and [facility] plavix as he is high risk for triple therapy. -->  Warfarin started last night, continue heparin bridge  2. Chronic combined CHF:  -- Euvolemic on exam. LVEF of 25-35%with anteroapicalWM abnormality, limited echo with slight improvement of LV to 35-40%.  -- on coreg 3.1234mBID and losartan 2586maily. -- --Add spironolactone today.  3. HTN:  -- BP improved. Tolerating coreg and ARB addition. Will add spirolactone today.   4. Sepsis/ Acute respiratory failure/ multifocal PNA: - Per primary  - COVID negative  5. AKI: - Scr improving 1.69>>1.66>>1.27>>1.07  6. New onset Afib RVR: converted yesterday after amiodarone bolus and drip. Maintaining SR. Will stop amiodarone today and plan to further titrate coreg as HR/BP tolerates.   -- bridging IV heparin to coumadin -- suspect Afib 2/2 to underlying infection and recent MI. This patients CHA2DS2-VASc Score of at least 7.        Signed, LinReino BellisP  04/24/2019, 9:57 AM     ATTENDING ATTESTATION  I have seen, examined and evaluated the patient this morning along with LinReino BellisA.  After reviewing all the available data and chart, we discussed the patients laboratory, study & physical findings as well as symptoms in detail. I agree with her findings, examination as well as impression recommendations as per our discussion.    Attending adjustments noted in italics.   Thankfully,  he converted from A. fib to sinus rhythm.  Stop amiodarone continue beta-blocker for now. Started warfarin last night, continue IV heparin for bridging. With the need for warfarin, would simply use aspirin and not Plavix for now.  However if there is no evidence of bleeding on aspirin plus warfarin, we can consider switching aspirin to Plavix.  Seems euvolemic on exam although he does have significant rhonchi.  Will check chest x-ray today just to ensure no evidence of pulmonary edema.  I suspect this is been more related to his airspace disease. -> If evidence pulmonary, would probably benefit from at least 1 dose of IV Lasix.  We will follow along and provide additional recommendations as warranted, but will likely around every other day.    Glenetta Hew, M.D., M.S. Interventional Cardiologist   Pager # [phone] Phone # [phone] [address] [place], [place] [address]     For questions or updates, please contact [place] Please consult [email or web address] for contact info under

## 2019-04-24 NOTE — Discharge Instructions (Signed)

## 2019-04-24 NOTE — Progress Notes (Signed)
Pt converted back to Sinus Rhythm.

## 2019-04-25 LAB — GLUCOSE, CAPILLARY
Glucose-Capillary: 101 mg/dL — ABNORMAL HIGH (ref 70–99)
Glucose-Capillary: 103 mg/dL — ABNORMAL HIGH (ref 70–99)
Glucose-Capillary: 106 mg/dL — ABNORMAL HIGH (ref 70–99)
Glucose-Capillary: 110 mg/dL — ABNORMAL HIGH (ref 70–99)
Glucose-Capillary: 112 mg/dL — ABNORMAL HIGH (ref 70–99)
Glucose-Capillary: 162 mg/dL — ABNORMAL HIGH (ref 70–99)
Glucose-Capillary: 64 mg/dL — ABNORMAL LOW (ref 70–99)

## 2019-04-25 LAB — MAGNESIUM: Magnesium: 1.8 mg/dL (ref 1.7–2.4)

## 2019-04-25 LAB — CBC WITH DIFFERENTIAL/PLATELET
Abs Immature Granulocytes: 0.05 10*3/uL (ref 0.00–0.07)
Basophils Absolute: 0 10*3/uL (ref 0.0–0.1)
Basophils Relative: 0 %
Eosinophils Absolute: 0.3 10*3/uL (ref 0.0–0.5)
Eosinophils Relative: 3 %
HCT: 33 % — ABNORMAL LOW (ref 39.0–52.0)
Hemoglobin: 10.5 g/dL — ABNORMAL LOW (ref 13.0–17.0)
Immature Granulocytes: 1 %
Lymphocytes Relative: 15 %
Lymphs Abs: 1.5 10*3/uL (ref 0.7–4.0)
MCH: 27.3 pg (ref 26.0–34.0)
MCHC: 31.8 g/dL (ref 30.0–36.0)
MCV: 85.7 fL (ref 80.0–100.0)
Monocytes Absolute: 0.6 10*3/uL (ref 0.1–1.0)
Monocytes Relative: 6 %
Neutro Abs: 7.3 10*3/uL (ref 1.7–7.7)
Neutrophils Relative %: 75 %
Platelets: 312 10*3/uL (ref 150–400)
RBC: 3.85 MIL/uL — ABNORMAL LOW (ref 4.22–5.81)
RDW: 14 % (ref 11.5–15.5)
WBC: 9.7 10*3/uL (ref 4.0–10.5)
nRBC: 0 % (ref 0.0–0.2)

## 2019-04-25 LAB — RENAL FUNCTION PANEL
Albumin: 2.1 g/dL — ABNORMAL LOW (ref 3.5–5.0)
Anion gap: 8 (ref 5–15)
BUN: 15 mg/dL (ref 8–23)
CO2: 25 mmol/L (ref 22–32)
Calcium: 8.6 mg/dL — ABNORMAL LOW (ref 8.9–10.3)
Chloride: 105 mmol/L (ref 98–111)
Creatinine, Ser: 1.05 mg/dL (ref 0.61–1.24)
GFR calc Af Amer: >60 mL/min (ref >60)
GFR calc non Af Amer: >60 mL/min (ref >60)
Glucose, Bld: 190 mg/dL — ABNORMAL HIGH (ref 70–99)
Phosphorus: 2.6 mg/dL (ref 2.5–4.6)
Potassium: 3.2 mmol/L — ABNORMAL LOW (ref 3.5–5.1)
Sodium: 138 mmol/L (ref 135–145)

## 2019-04-25 LAB — PROTIME-INR
INR: 1.3 — ABNORMAL HIGH (ref 0.8–1.2)
Prothrombin Time: 16.3 seconds — ABNORMAL HIGH (ref 11.4–15.2)

## 2019-04-25 LAB — HEPARIN LEVEL (UNFRACTIONATED): Heparin Unfractionated: 0.38 IU/mL (ref 0.30–0.70)

## 2019-04-25 MED ORDER — POTASSIUM CHLORIDE CRYS ER 20 MEQ PO TBCR
40.0000 meq | EXTENDED_RELEASE_TABLET | Freq: Once | ORAL | Status: AC
  Administered 2019-04-25: 40 meq via ORAL
  Filled 2019-04-25: qty 2

## 2019-04-25 MED ORDER — MAGNESIUM OXIDE 400 (241.3 MG) MG PO TABS
800.0000 mg | ORAL_TABLET | Freq: Once | ORAL | Status: AC
  Administered 2019-04-25: 800 mg via ORAL
  Filled 2019-04-25: qty 2

## 2019-04-25 MED ORDER — DEXTROSE 50 % IV SOLN
INTRAVENOUS | Status: AC
  Administered 2019-04-25: 25 mL
  Filled 2019-04-25: qty 50

## 2019-04-25 MED ORDER — CARVEDILOL 6.25 MG PO TABS
6.2500 mg | ORAL_TABLET | Freq: Two times a day (BID) | ORAL | Status: DC
  Administered 2019-04-25 – 2019-04-29 (×8): 6.25 mg via ORAL
  Filled 2019-04-25 (×8): qty 1

## 2019-04-25 MED ORDER — SENNOSIDES-DOCUSATE SODIUM 8.6-50 MG PO TABS
2.0000 | ORAL_TABLET | Freq: Every evening | ORAL | Status: DC | PRN
  Administered 2019-04-28: 2 via ORAL
  Filled 2019-04-25: qty 2

## 2019-04-25 MED ORDER — WARFARIN SODIUM 7.5 MG PO TABS
7.5000 mg | ORAL_TABLET | Freq: Once | ORAL | Status: AC
  Administered 2019-04-25: 7.5 mg via ORAL
  Filled 2019-04-25: qty 1

## 2019-04-25 MED ORDER — ONDANSETRON HCL 4 MG/2ML IJ SOLN
4.0000 mg | Freq: Three times a day (TID) | INTRAMUSCULAR | Status: DC | PRN
  Administered 2019-04-25: 4 mg via INTRAVENOUS
  Filled 2019-04-25: qty 2

## 2019-04-25 NOTE — Progress Notes (Signed)
[place] for Heparin > warfarin Indication: chest pain/ACS/new Afib  Allergies  Allergen Reactions  . Bactrim [Sulfamethoxazole-Trimethoprim] Other (See Comments)    Listed on [facility]    Patient Measurements: Height: 6' 2.02" (188 cm) Weight: 182 lb 1.6 oz (82.6 kg) IBW/kg (Calculated) : 82.24  Heparin Dosing Wt: 90.7 kg  Vital Signs: Temp: 98.7 F (37.1 C) (07/23 0652) Temp Source: Oral (07/23 0652) BP: 138/68 (07/23 0823) Pulse Rate: 83 (07/23 0823)  Labs: Recent Labs    04/23/19 0814 04/24/19 0349 04/25/19 0520  HGB 11.2* 10.6* 10.5*  HCT 35.3* 33.3* 33.0*  PLT 286 276 312  LABPROT  --  14.1 16.3*  INR  --  1.1 1.3*  HEPARINUNFRC 0.29* 0.42 0.38  CREATININE  --  1.07 1.05    Estimated Creatinine Clearance: 69.6 mL/min (by C-G formula based on SCr of 1.05 mg/dL).  Assessment: 78 yr old male presenting with SOB and fever. EKG changes showing STEMI. Patient was being medically managed as he is not a candidate for interventional therapy. New afib on 7/20, started amiodarone short-term but now discontinued. No anticoagulation PTA. Pharmacy consulted to dose warfarin with heparin bridge.  Heparin remains therapeutic and INR is subtherapeutic but now with slight trend up. No bleeding noted.  Goal of Therapy:  Heparin level 0.3-0.7 units/ml Monitor platelets by anticoagulation protocol: Yes   Plan:  Warfarin 7.5mg  x1 tonight Continue heparin to 1600 units/hr Daily heparin level and CBC, INR Monitor s/s bleeding  Salome Arnt, PharmD, BCPS Please see AMION for all pharmacy numbers 04/25/2019 9:02 AM

## 2019-04-25 NOTE — Progress Notes (Signed)
Hypoglycemic Event  CBG: 64  Treatment: D50 25 mL (12.5 gm)  Symptoms: Pale  Follow-up CBG: Time:0525 CBG Result:162  Possible Reasons for Event: Inadequate meal intake  Comments/MD notified:per protocol    Luis Lyons L

## 2019-04-25 NOTE — Progress Notes (Signed)
  Speech Language Pathology Treatment: Dysphagia  Patient Details Name: Luis Lyons MRN: [id number] DOB: 11-12-1940 Today's Date: 04/25/2019 Time: 7867-5449 SLP Time Calculation (min) (ACUTE ONLY): 17 min  Assessment / Plan / Recommendation Clinical Impression  Pt still needs Mod cues to maintain upright positioning and implement consistent, accurate use of chin tuck with liquid intake, which are both important before attempting advanced trials of thin liquids. He did not have overt signs of aspiration with trials with SLP, although he does still have mild, audible congestion at baseline. He has remained afebrile and has transitioned to room air though. Recommend continuing current diet and precautions for now.   HPI HPI: Pt is a 77 yo male admitted from SNF with CXR concerning for PNA and/or aspiration. Code STEMI was called early morning on 7/19. He has a h/o oral cancer s/p mandibulectomy and XRT (2014), for which he had a PEG that was removed after tx. He has been consuming soft solids (missing bottom dentition) and thin liquids PTA. BSE in December 2019 recommended regular solids and thin liquids with pt managing chronic dysphagia seemingly well. PMH also includes: prostate ca, CVA 2013, HTN, dry mouth, self-reported GER      SLP Plan  Continue with current plan of care       Recommendations  Diet recommendations: Dysphagia 2 (fine chop);Nectar-thick liquid Liquids provided via: Straw Medication Administration: Crushed with puree Supervision: Patient able to self feed;Full supervision/cueing for compensatory strategies Compensations: Minimize environmental distractions;Slow rate;Small sips/bites;Use straw to facilitate chin tuck Postural Changes and/or Swallow Maneuvers: Seated upright 90 degrees;Upright 30-60 min after meal                Oral Care Recommendations: Oral care BID Follow up Recommendations: Skilled Nursing facility SLP Visit Diagnosis: Dysphagia, oropharyngeal  phase (R13.12) Plan: Continue with current plan of care       GO                Luis Lyons Luis Lyons 04/25/2019, 12:00 PM  Luis Lyons, Luis Lyons Acute Environmental education officer [phone] Office [phone]

## 2019-04-25 NOTE — Progress Notes (Signed)
Progress Note  Patient Name: Luis Lyons Date of Encounter: 04/25/2019  Primary Cardiologist: No primary care provider on file.   Subjective   Still has no complaints.  Coughing a little bit this morning.  But no chest pain or pressure.  Does not complain of dyspnea.   Inpatient Medications    Scheduled Meds: . aspirin  81 mg Oral Daily  . atorvastatin  80 mg Oral q1800  . carvedilol  3.125 mg Oral BID WC  . coumadin book   Does not apply Once  . furosemide  40 mg Oral QODAY  . insulin aspart  0-9 Units Subcutaneous Q4H  . losartan  25 mg Oral Daily  . magnesium oxide  800 mg Oral Once  . potassium chloride  40 mEq Oral Once  . sodium chloride flush  10-40 mL Intracatheter Q12H  . sodium chloride flush  3 mL Intravenous Q12H  . spironolactone  12.5 mg Oral Daily  . warfarin  7.5 mg Oral ONCE-1800  . Warfarin - Pharmacist Dosing Inpatient   Does not apply q1800   Continuous Infusions: . heparin 1,600 Units/hr (04/24/19 2139)  . meropenem (MERREM) IV 1,000 mg (04/25/19 0413)  . vancomycin 250 mL/hr at 04/24/19 1832   PRN Meds: acetaminophen **OR** acetaminophen, levalbuterol, polyethylene glycol, Resource ThickenUp Clear, senna-docusate, sodium chloride flush   Vital Signs    Vitals:   04/25/19 0039 04/25/19 0652 04/25/19 0653 04/25/19 0823  BP:   138/68 138/68  Pulse: 69  84 83  Resp: (!) 28  (!) 39   Temp: 98.2 F (36.8 C) 98.7 F (37.1 C)    TempSrc: Oral Oral    SpO2: 97%  98%   Weight:   82.6 kg   Height:        Intake/Output Summary (Last 24 hours) at 04/25/2019 0919 Last data filed at 04/25/2019 0853 Gross per 24 hour  Intake 1821.46 ml  Output 2200 ml  Net -378.54 ml   Last 3 Weights 04/25/2019 04/23/2019 04/21/2019  Weight (lbs) 182 lb 1.6 oz 179 lb 14.3 oz 181 lb 14.1 oz  Weight (kg) 82.6 kg 81.6 kg 82.5 kg      Telemetry    Mostly sinus rhythm with rates in the 60s to 80s but has frequent PACs.- Personally Reviewed -> converted to sinus  rhythm in the evening yesterday.  ECG    None new tracing- Personally Reviewed  Physical Exam   GEN:  Frail, elderly gentleman chronically ill-appearing.  No obvious distress.  Clear dysarthria.   Neck:  No obvious JVD.  He does have somewhat unusual anatomy almost scaphoid neck. Cardiac:  Regular rate and rhythm with ectopy.  No M/R/G. Respiratory:  Diffuse coarse breath sounds but clear with cough.  Scattered rhonchi but no rales or wheezing. GI:  Soft/NT/ND NABS.  No HSM.. MS:  No C/C/E.  No edema Neuro:   Dysarthric speech  Labs    High Sensitivity Troponin:   Recent Labs  Lab 04/21/19 0609 04/21/19 0934 04/21/19 1159 04/21/19 1454  TROPONINIHS 3,566* 4,179* 4,027* 4,159*      Cardiac EnzymesNo results for input(s): TROPONINI in the last 168 hours. No results for input(s): TROPIPOC in the last 168 hours.   Chemistry Recent Labs  Lab 04/20/19 1911 04/21/19 0609 04/22/19 0718 04/24/19 0349 04/25/19 0520  NA 135 138 138 139 138  K 3.4* 4.5 4.4 4.0 3.2*  CL 103 109 106 105 105  CO2 19* 19* _0 GLUCOSE 235*  175* 128* 115* 190*  BUN 24* 24* 25* 23 15  CREATININE 1.69* 1.66* 1.27* 1.07 1.05  CALCIUM 9.2 8.6* 9.2 9.2 8.6*  PROT 8.2* 7.4  --  6.7  --   ALBUMIN 2.8* 2.4*  --  2.3* 2.1*  AST 26 45*  --  19  --   ALT 15 17  --  21  --   ALKPHOS 86 67  --  62  --   BILITOT 1.2 0.5  --  0.4  --   GFRNONAA 39* 39* 55* >60 >60  GFRAA 45* 46* >60 >60 >60  ANIONGAP _0 Hematology Recent Labs  Lab 04/23/19 0814 04/24/19 0349 04/25/19 0520  WBC 11.9* 9.8 9.7  RBC 4.08* 3.87* 3.85*  HGB 11.2* 10.6* 10.5*  HCT 35.3* 33.3* 33.0*  MCV 86.5 86.0 85.7  MCH 27.5 27.4 27.3  MCHC 31.7 31.8 31.8  RDW 14.0 14.0 14.0  PLT 286 276 312    BNPNo results for input(s): BNP, PROBNP in the last 168 hours.   DDimer No results for input(s): DDIMER in the last 168 hours.   Radiology    Dg Chest Port 1 View  Result Date: 04/24/2019 CLINICAL DATA:  Cough,  hypertension, history stroke, former smoker EXAM: PORTABLE CHEST 1 VIEW COMPARISON:  Portable exam 1211 hours compared to 04/22/2019 FINDINGS: Normal heart size, mediastinal contours, and pulmonary vascularity. Atherosclerotic calcification aorta. Emphysematous changes with chronic elevation of RIGHT diaphragm with minimal RIGHT basilar atelectasis. Accentuated interstitial markings at both lung bases, unchanged. No definite pleural effusion or pneumothorax. Bones demineralized. IMPRESSION: Persistent bibasilar opacities question infiltrate, unchanged. Electronically Signed   By: Lavonia Dana M.D.   On: 04/24/2019 14:11    Cardiac Studies    TTE: 04/21/19: Severely reduced EF 25 to 30%.  Grade 2 diastolic dysfunction.  Elevated LAP.  Akinesis of the anteroseptal and apical wall.  Cannot rule out apical thrombus..  Severe MAC.   Limited TTE: 04/22/19: Difficult study: EF estimated 35 to 40%.  Apical hypokinesis.   Patient Profile     78 y.o. male former smoker and nursing home resident with a history of HTN, CVA with residual left sided weakness, who presented with fever and hypoxic respiratory failure in the setting of presumed bacterial pneumonia. Routine EKGyesterdaymorning revealed 2-3 mm ST elevations in the anterolateral leads (V3-V6) with subtle reciprocal changes inferiorly>>cardiology is consulted>>reviewed with Dr. Gwenlyn Found (interventionalist on call) who felt that given his comorbidities medical conditions, clinical improvement, and lack of symptoms>> medical therapy.  Assessment & Plan    1. STEMI:  Over the weekend, had clear anterior celebrations on EKG they are now resolved.  Troponin peaked at roughly 4000 and then trended down.  Initial EKG showed severely reduced EF 25 to 30% with anteroseptal and apical akinesis with severe dysfunction.  Follow-up limited echo with contrast showed slightly better EF of 35 to 40% with anteroapical hypokinesis but no thrombus. --Was seen by  interventional cardiology at the time of the EKG, was not having any chest pain, and it was admitted with essentially sepsis with UTI and pneumonia.  Plan was for medical management.  Not felt to be a good invasive candidate given his other comorbidities. ->  Recommended medical management.  Remains on IV heparin because of A. fib and conversion to warfarin..   On aspirin statin along with beta-blocker.  We converted to carvedilol and will increase to 6.25 mg twice daily today. --Once stable on  warfarin, would probably convert from aspirin to Plavix for discharge given ACS.  For now continue aspirin until therapeutic INR.  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  Initial EF estimated 25 to 30% improved to 35 to 40% on follow-up echo (this could argue that some of the wall motion normality and reduced EF is related to stress-induced cardiomyopathy)  Still seems euvolemic on exam, but did have some diuresis yesterday -> agree with standing dose of Lasix for now.  Tolerating spironolactone at current dose, if blood pressure tolerates after initial increase of carvedilol, will increase to 25 mg daily.  Increase carvedilol to 625 mg twice daily  3. HTN: -- BP improved. Tolerating coreg and ARB addition.   Added spironolactone on 7/22  Especially because of more ectopy on exam and telemetry, we will increase beta-blocker to 6.25 mg twice daily  If tolerating increased dose of beta-blocker, would titrate up spironolactone 25 mg daily prior to discharge.  4. Sepsis/ Acute respiratory failure/ multifocal PNA: Per [facility] hospitalist. - COVID negative  5. AKI: - Scr improving 1.69>>1.66>>1.27>>1.07  6. New onset Afib RVR: converted on 7/21 with amiodarone bolus and drip now maintaining sinus rhythm, but has more frequent ectopy.  . -- suspect Afib 2/2 to underlying infection and recent MI. This patients CHA2DS2-VASc Score of at least 7.   Continue bridging with IV heparin  to Coumadin (could use Lovenox to save volume)  Increase carvedilol to 6.25 mg twice daily today.   We will follow along.       Signed, Glenetta Hew, M.D., M.S. Interventional Cardiologist   04/25/2019 9:29 AM    For questions or updates, please contact [place] Please consult [email or web address] for contact info under

## 2019-04-25 NOTE — Progress Notes (Signed)
PROGRESS NOTE    Luis Lyons  UVO:[id number] DOB: [birth year] DOA: 04/20/2019 PCP: Elson Clan, MD   Brief Narrative:  78 year old with history of essential hypertension, CVA with residual left-sided weaknessHe resides at a nursing home presented to the hospital with acute hypoxic respiratory failure diagnosed with bacterial pneumonia.  Hospital course complicated by ST elevations requiring cardiology evaluation.  Echocardiogram showed ejection fraction 25-30% and it was deemed that patient is not a candidate for left heart catheterization and recommended medical management.  Started on IV heparin which was transitioned to Coumadin.  Medication adjustments were made according to cardiology service.  Blood cultures positive for Staphylococcus hominis, currently on IV vancomycin.  Repeat cultures no growth.   Assessment & Plan:   Principal Problem:   Sepsis due to pneumonia [facility]) Active Problems:   Acute respiratory failure with hypoxia (HCC)   History of CVA (cerebrovascular accident)   Hyperglycemia   Hypokalemia   Renal insufficiency   Prolonged QT interval  Acute hypoxic respiratory failure likely aspiration pneumonia Staphylococcus hominis bacteremia - Speech therapy recommended dysphagia 2 diet -Continue IV vancomycin until repeat cultures are negative. - Bronchodilators as necessary.  Supportive care.  Supplemental oxygen as needed.  ST elevation MI -Medical management at this point.  On Coumadin and aspirin. -On Coreg, Lipitor, Aldactone and losartan - On heparin drip transitioning to Coumadin.  History of prostate cancer status post prostatectomy -Followed at [facility].  ESBL E. 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  Therefore on a dysphagia diet.  Essential hypertension -Continue current meds.  Cardiac medications  been adjusted by cardiology service.  Acute kidney injury, resolved -Admission creatinine 1.69, trended down to 1.07.  DVT prophylaxis: Coumadin Code Status: DNR Family Communication: None at bedside Disposition Plan: Transition to nursing home once determined he is stable from cardiac standpoint and his INR is therapeutic.  Consultants:   Cardiology  Procedures:   None  Antimicrobials:   Vancomycin  Meropenem   Subjective: Patient remains afebrile, does not have any complaints for me.  Denies any chest pain.  Review of Systems Otherwise negative except as per HPI, including: General: Denies fever, chills, night sweats or unintended weight loss. Resp: Denies cough, wheezing, shortness of breath. Cardiac: Denies chest pain, palpitations, orthopnea, paroxysmal nocturnal dyspnea. GI: Denies abdominal pain, nausea, vomiting, diarrhea or constipation GU: Denies dysuria, frequency, hesitancy or incontinence MS: Denies muscle aches, joint pain or swelling Neuro: Denies headache, neurologic deficits (focal weakness, numbness, tingling), abnormal gait Psych: Denies anxiety, depression, SI/HI/AVH Skin: Denies new rashes or lesions ID: Denies sick contacts, exotic exposures, travel  Objective: Vitals:   04/25/19 0039 04/25/19 0652 04/25/19 0653 04/25/19 0823  BP:   138/68 138/68  Pulse: 69  84 83  Resp: (!) 28  (!) 39   Temp: 98.2 F (36.8 C) 98.7 F (37.1 C)    TempSrc: Oral Oral    SpO2: 97%  98%   Weight:   82.6 kg   Height:        Intake/Output Summary (Last 24 hours) at 04/25/2019 1241 Last data filed at 04/25/2019 0853 Gross per 24 hour  Intake 1821.46 ml  Output 2200 ml  Net -378.54 ml   Filed Weights   04/21/19 0412 04/23/19 0418 04/25/19 0653  Weight: 82.5 kg 81.6 kg 82.6 kg    Examination:  General exam: Calm.  Previous  surgical scars noted on his face and neck. Respiratory system: Clear to auscultation. Respiratory effort normal. Cardiovascular  system: S1 & S2 heard, RRR. No JVD, murmurs, rubs, gallops or clicks. No pedal edema. Gastrointestinal system: Abdomen is nondistended, soft and nontender. No organomegaly or masses felt. Normal bowel sounds heard.Abdominal surgical scars noted. Central nervous system: Alert and oriented to name and place. No focal neurological deficits. Extremities: Symmetric 4 x 5 power. Skin: No rashes, lesions or ulcers Psychiatry: Judgement and insight appear normal. Mood & affect appropriate.     Data Reviewed:   CBC: Recent Labs  Lab 04/20/19 1911 04/22/19 0718 04/23/19 0814 04/24/19 0349 04/25/19 0520  WBC 22.8* 27.7* 11.9* 9.8 9.7  NEUTROABS 20.7* 24.7*  --  7.0 7.3  HGB 11.7* 11.4* 11.2* 10.6* 10.5*  HCT 37.9* 36.5* 35.3* 33.3* 33.0*  MCV 87.7 87.1 86.5 86.0 85.7  PLT 321 319 286 276 673   Basic Metabolic Panel: Recent Labs  Lab 04/20/19 1911 04/21/19 0609 04/22/19 0718 04/24/19 0349 04/25/19 0520  NA 135 138 138 139 138  K 3.4* 4.5 4.4 4.0 3.2*  CL 103 109 106 105 105  CO2 19* 19* 25 25 25   GLUCOSE 235* 175* 128* 115* 190*  BUN 24* 24* 25* 23 15  CREATININE 1.69* 1.66* 1.27* 1.07 1.05  CALCIUM 9.2 8.6* 9.2 9.2 8.6*  MG  --  2.5*  --  2.1 1.8  PHOS  --   --   --   --  2.6   GFR: Estimated Creatinine Clearance: 69.6 mL/min (by C-G formula based on SCr of 1.05 mg/dL). Liver Function Tests: Recent Labs  Lab 04/20/19 1911 04/21/19 0609 04/24/19 0349 04/25/19 0520  AST 26 45* 19  --   ALT 15 17 21   --   ALKPHOS 86 67 62  --   BILITOT 1.2 0.5 0.4  --   PROT 8.2* 7.4 6.7  --   ALBUMIN 2.8* 2.4* 2.3* 2.1*   No results for input(s): LIPASE, AMYLASE in the last 168 hours. No results for input(s): AMMONIA in the last 168 hours. Coagulation Profile: Recent Labs  Lab 04/20/19 1911 04/24/19 0349 04/25/19 0520  INR 1.2 1.1 1.3*   Cardiac Enzymes: No results for input(s): CKTOTAL, CKMB, CKMBINDEX, TROPONINI in the last 168 hours. BNP (last 3 results) No results for  input(s): PROBNP in the last 8760 hours. HbA1C: No results for input(s): HGBA1C in the last 72 hours. CBG: Recent Labs  Lab 04/25/19 0031 04/25/19 0455 04/25/19 0525 04/25/19 0826 04/25/19 1120  GLUCAP 103* 64* 162* 101* 106*   Lipid Profile: No results for input(s): CHOL, HDL, LDLCALC, TRIG, CHOLHDL, LDLDIRECT in the last 72 hours. Thyroid Function Tests: Recent Labs    04/24/19 0349  TSH 1.447   Anemia Panel: No results for input(s): VITAMINB12, FOLATE, FERRITIN, TIBC, IRON, RETICCTPCT in the last 72 hours. Sepsis Labs: Recent Labs  Lab 04/20/19 1911 04/20/19 2329  LATICACIDVEN 2.1* 1.2    Recent Results (from the past 240 hour(s))  Blood Culture (routine x 2)     Status: Abnormal   Collection Time: 04/20/19  7:20 PM   Specimen: BLOOD LEFT WRIST  Result Value Ref Range Status   Specimen Description BLOOD LEFT WRIST  Final   Special Requests   Final    BOTTLES DRAWN AEROBIC AND ANAEROBIC Blood Culture adequate volume   Culture  Setup Time   Final    GRAM POSITIVE COCCI IN BOTH AEROBIC AND ANAEROBIC BOTTLES CRITICAL RESULT CALLED  TO, READ BACK BY AND VERIFIED WITH: PHARMD T BAUMEYSTER [id number] AT 1408 BY CM    Culture (A)  Final    STAPHYLOCOCCUS HOMINIS SUSCEPTIBILITIES PERFORMED ON PREVIOUS CULTURE WITHIN THE LAST 5 DAYS. WITH MULTIPLE  STAPHYLOCOCCUS SPECIES (COAGULASE NEGATIVE) THE SIGNIFICANCE OF ISOLATING THIS ORGANISM FROM A SINGLE SET OF BLOOD CULTURES WHEN MULTIPLE SETS ARE DRAWN IS UNCERTAIN. PLEASE NOTIFY THE MICROBIOLOGY DEPARTMENT WITHIN ONE WEEK IF SPECIATION AND SENSITIVITIES ARE REQUIRED. Performed at [facility], [place] [address], [place], [place] [address]    Report Status 04/24/2019 FINAL  Final  Blood Culture ID Panel (Reflexed)     Status: Abnormal   Collection Time: 04/20/19  7:20 PM  Result Value Ref Range Status   Enterococcus species NOT DETECTED NOT DETECTED Final   Listeria monocytogenes NOT DETECTED NOT DETECTED Final    Staphylococcus species DETECTED (A) NOT DETECTED Final    Comment: Methicillin (oxacillin) resistant coagulase negative staphylococcus. Possible blood culture contaminant (unless isolated from more than one blood culture draw or clinical case suggests pathogenicity). No antibiotic treatment is indicated for blood  culture contaminants. CRITICAL RESULT CALLED TO, READ BACK BY AND VERIFIED WITH: PHARMD T BAUMEYSTER [id number] AT 1408 BY CM    Staphylococcus aureus (BCID) NOT DETECTED NOT DETECTED Final   Methicillin resistance DETECTED (A) NOT DETECTED Final    Comment: CRITICAL RESULT CALLED TO, READ BACK BY AND VERIFIED WITH: PHARMD T BAUMEYSTER [id number] AT 1408 BY CM    Streptococcus species NOT DETECTED NOT DETECTED Final   Streptococcus agalactiae NOT DETECTED NOT DETECTED Final   Streptococcus pneumoniae NOT DETECTED NOT DETECTED Final   Streptococcus pyogenes NOT DETECTED NOT DETECTED Final   Acinetobacter baumannii NOT DETECTED NOT DETECTED Final   Enterobacteriaceae species NOT DETECTED NOT DETECTED Final   Enterobacter cloacae complex NOT DETECTED NOT DETECTED Final   Escherichia coli NOT DETECTED NOT DETECTED Final   Klebsiella oxytoca NOT DETECTED NOT DETECTED Final   Klebsiella pneumoniae NOT DETECTED NOT DETECTED Final   Proteus species NOT DETECTED NOT DETECTED Final   Serratia marcescens NOT DETECTED NOT DETECTED Final   Haemophilus influenzae NOT DETECTED NOT DETECTED Final   Neisseria meningitidis NOT DETECTED NOT DETECTED Final   Pseudomonas aeruginosa NOT DETECTED NOT DETECTED Final   Candida albicans NOT DETECTED NOT DETECTED Final   Candida glabrata NOT DETECTED NOT DETECTED Final   Candida krusei NOT DETECTED NOT DETECTED Final   Candida parapsilosis NOT DETECTED NOT DETECTED Final   Candida tropicalis NOT DETECTED NOT DETECTED Final    Comment: Performed at [facility], [address] [place], [place] [address]  Blood Culture (routine x 2)     Status: Abnormal    Collection Time: 04/20/19  7:23 PM   Specimen: BLOOD  Result Value Ref Range Status   Specimen Description BLOOD RIGHT ANTECUBITAL  Final   Special Requests   Final    BOTTLES DRAWN AEROBIC AND ANAEROBIC Blood Culture adequate volume   Culture  Setup Time   Final    GRAM POSITIVE COCCI IN BOTH AEROBIC AND ANAEROBIC BOTTLES CRITICAL RESULT CALLED TO, READ BACK BY AND VERIFIED WITH: Miles [id number] AT 1408 BY CM Performed at [facility], [place] [address], [place], [place] [address]    Culture STAPHYLOCOCCUS HOMINIS (A)  Final   Report Status 04/24/2019 FINAL  Final   Organism ID, Bacteria STAPHYLOCOCCUS HOMINIS  Final      Susceptibility   Staphylococcus hominis - MIC*  CIPROFLOXACIN >=8 RESISTANT Resistant     ERYTHROMYCIN >=8 RESISTANT Resistant     GENTAMICIN <=0.5 SENSITIVE Sensitive     OXACILLIN >=4 RESISTANT Resistant     TETRACYCLINE <=1 SENSITIVE Sensitive     VANCOMYCIN <=0.5 SENSITIVE Sensitive     TRIMETH/SULFA <=10 SENSITIVE Sensitive     CLINDAMYCIN RESISTANT Resistant     RIFAMPIN <=0.5 SENSITIVE Sensitive     Inducible Clindamycin POSITIVE Resistant     * STAPHYLOCOCCUS HOMINIS  SARS Coronavirus 2 (CEPHEID- Performed in [place] hospital lab), Hosp Order     Status: None   Collection Time: 04/20/19  7:58 PM   Specimen: Nasopharyngeal Swab  Result Value Ref Range Status   SARS Coronavirus 2 NEGATIVE NEGATIVE Final    Comment: (NOTE) If result is NEGATIVE SARS-CoV-2 target nucleic acids are NOT DETECTED. The SARS-CoV-2 RNA is generally detectable in upper and lower  respiratory specimens during the acute phase of infection. The lowest  concentration of SARS-CoV-2 viral copies this assay can detect is 250  copies / mL. A negative result does not preclude SARS-CoV-2 infection  and should not be used as the sole basis for treatment or other  patient management decisions.  A negative result may occur with  improper specimen collection /  handling, submission of specimen other  than nasopharyngeal swab, presence of viral mutation(s) within the  areas targeted by this assay, and inadequate number of viral copies  (<250 copies / mL). A negative result must be combined with clinical  observations, patient history, and epidemiological information. If result is POSITIVE SARS-CoV-2 target nucleic acids are DETECTED. The SARS-CoV-2 RNA is generally detectable in upper and lower  respiratory specimens dur ing the acute phase of infection.  Positive  results are indicative of active infection with SARS-CoV-2.  Clinical  correlation with patient history and other diagnostic information is  necessary to determine patient infection status.  Positive results do  not rule out bacterial infection or co-infection with other viruses. If result is PRESUMPTIVE POSTIVE SARS-CoV-2 nucleic acids MAY BE PRESENT.   A presumptive positive result was obtained on the submitted specimen  and confirmed on repeat testing.  While 2019 novel coronavirus  (SARS-CoV-2) nucleic acids may be present in the submitted sample  additional confirmatory testing may be necessary for epidemiological  and / or clinical management purposes  to differentiate between  SARS-CoV-2 and other Sarbecovirus currently known to infect humans.  If clinically indicated additional testing with an alternate test  methodology [phone]) is advised. The SARS-CoV-2 RNA is generally  detectable in upper and lower respiratory sp ecimens during the acute  phase of infection. The expected result is Negative. Fact Sheet for Patients:  StrictlyIdeas.no Fact Sheet for Healthcare Providers: BankingDealers.co.za This test is not yet approved or cleared by the [place] FDA and has been authorized for detection and/or diagnosis of SARS-CoV-2 by FDA under an Emergency Use Authorization (EUA).  This EUA will remain in effect (meaning this  test can be used) for the duration of the COVID-19 declaration under Section 564(b)(1) of the Act, 21 U.S.C. section 360bbb-3(b)(1), unless the authorization is terminated or revoked sooner. Performed at [facility], [place] [address], [place], [place] [address]   Urine culture     Status: Abnormal   Collection Time: 04/20/19  8:28 PM   Specimen: In/Out Cath Urine  Result Value Ref Range Status   Specimen Description IN/OUT CATH URINE  Final   Special Requests   Final  NONE Performed at [facility], [place] [address], [place], [place] [address]    Culture (A)  Final    >=100,000 COLONIES/mL ESCHERICHIA COLI 50,000 COLONIES/mL PROTEUS MIRABILIS ESCHERICHIA COLI is Confirmed Extended Spectrum Beta-Lactamase Producer (ESBL).  In bloodstream infections from ESBL organisms, carbapenems are preferred over piperacillin/tazobactam. They are shown to have a lower risk of mortality.    Report Status 04/23/2019 FINAL  Final   Organism ID, Bacteria ESCHERICHIA COLI (A)  Final   Organism ID, Bacteria PROTEUS MIRABILIS (A)  Final      Susceptibility   Escherichia coli - MIC*    AMPICILLIN >=32 RESISTANT Resistant     CEFAZOLIN >=64 RESISTANT Resistant     CEFTRIAXONE >=64 RESISTANT Resistant     CIPROFLOXACIN >=4 RESISTANT Resistant     GENTAMICIN 8 INTERMEDIATE Intermediate     IMIPENEM <=0.25 SENSITIVE Sensitive     NITROFURANTOIN <=16 SENSITIVE Sensitive     TRIMETH/SULFA >=320 RESISTANT Resistant     AMPICILLIN/SULBACTAM >=32 RESISTANT Resistant     PIP/TAZO 64 INTERMEDIATE Intermediate     Extended ESBL POSITIVE Resistant     * >=100,000 COLONIES/mL ESCHERICHIA COLI   Proteus mirabilis - MIC*    AMPICILLIN >=32 RESISTANT Resistant     CEFAZOLIN 8 SENSITIVE Sensitive     CEFTRIAXONE <=1 SENSITIVE Sensitive     CIPROFLOXACIN >=4 RESISTANT Resistant     GENTAMICIN 8 INTERMEDIATE Intermediate     IMIPENEM 1 SENSITIVE Sensitive     NITROFURANTOIN 256 RESISTANT Resistant      TRIMETH/SULFA >=320 RESISTANT Resistant     AMPICILLIN/SULBACTAM 8 SENSITIVE Sensitive     PIP/TAZO <=4 SENSITIVE Sensitive     * 50,000 COLONIES/mL PROTEUS MIRABILIS  MRSA PCR Screening     Status: None   Collection Time: 04/21/19  4:55 AM   Specimen: Nasopharyngeal  Result Value Ref Range Status   MRSA by PCR NEGATIVE NEGATIVE Final    Comment:        The GeneXpert MRSA Assay (FDA approved for NASAL specimens only), is one component of a comprehensive MRSA colonization surveillance program. It is not intended to diagnose MRSA infection nor to guide or monitor treatment for MRSA infections. Performed at [facility], [place] [address], [place], [place] [address]   Culture, blood (Routine X 2) w Reflex to ID Panel     Status: None (Preliminary result)   Collection Time: 04/24/19  2:54 PM   Specimen: BLOOD LEFT HAND  Result Value Ref Range Status   Specimen Description BLOOD LEFT HAND  Final   Special Requests   Final    BOTTLES DRAWN AEROBIC AND ANAEROBIC Blood Culture results may not be optimal due to an excessive volume of blood received in culture bottles   Culture   Final    NO GROWTH < 24 HOURS Performed at [facility], [place] [address], [place], [place] [address]    Report Status PENDING  Incomplete  Culture, blood (Routine X 2) w Reflex to ID Panel     Status: None (Preliminary result)   Collection Time: 04/24/19  3:03 PM   Specimen: BLOOD  Result Value Ref Range Status   Specimen Description BLOOD RIGHT ANTECUBITAL  Final   Special Requests   Final    BOTTLES DRAWN AEROBIC AND ANAEROBIC Blood Culture results may not be optimal due to an excessive volume of blood received in culture bottles   Culture   Final    NO GROWTH <  24 HOURS Performed at [facility], [place] [address], [place], [place] [address]    Report Status PENDING  Incomplete         Radiology Studies: Dg Chest Port 1 View  Result Date: 04/24/2019 CLINICAL DATA:  Cough,  hypertension, history stroke, former smoker EXAM: PORTABLE CHEST 1 VIEW COMPARISON:  Portable exam 1211 hours compared to 04/22/2019 FINDINGS: Normal heart size, mediastinal contours, and pulmonary vascularity. Atherosclerotic calcification aorta. Emphysematous changes with chronic elevation of RIGHT diaphragm with minimal RIGHT basilar atelectasis. Accentuated interstitial markings at both lung bases, unchanged. No definite pleural effusion or pneumothorax. Bones demineralized. IMPRESSION: Persistent bibasilar opacities question infiltrate, unchanged. Electronically Signed   By: Lavonia Dana M.D.   On: 04/24/2019 14:11        Scheduled Meds: . aspirin  81 mg Oral Daily  . atorvastatin  80 mg Oral q1800  . carvedilol  6.25 mg Oral BID WC  . coumadin book   Does not apply Once  . furosemide  40 mg Oral QODAY  . insulin aspart  0-9 Units Subcutaneous Q4H  . losartan  25 mg Oral Daily  . sodium chloride flush  10-40 mL Intracatheter Q12H  . sodium chloride flush  3 mL Intravenous Q12H  . spironolactone  12.5 mg Oral Daily  . warfarin  7.5 mg Oral ONCE-1800  . Warfarin - Pharmacist Dosing Inpatient   Does not apply q1800   Continuous Infusions: . heparin 1,600 Units/hr (04/24/19 2139)  . meropenem (MERREM) IV 1,000 mg (04/25/19 0413)  . vancomycin 250 mL/hr at 04/24/19 1832     LOS: 5 days   Time spent= 35 mins    Nafis Farnan Arsenio Loader, MD [facility]  If 7PM-7AM, please contact night-coverage [email or web address] 04/25/2019, 12:41 PM

## 2019-04-26 LAB — COMPREHENSIVE METABOLIC PANEL
ALT: 20 U/L (ref 0–44)
AST: 20 U/L (ref 15–41)
Albumin: 2.1 g/dL — ABNORMAL LOW (ref 3.5–5.0)
Alkaline Phosphatase: 57 U/L (ref 38–126)
Anion gap: 8 (ref 5–15)
BUN: 12 mg/dL (ref 8–23)
CO2: 27 mmol/L (ref 22–32)
Calcium: 8.8 mg/dL — ABNORMAL LOW (ref 8.9–10.3)
Chloride: 103 mmol/L (ref 98–111)
Creatinine, Ser: 1.08 mg/dL (ref 0.61–1.24)
GFR calc Af Amer: >60 mL/min (ref >60)
GFR calc non Af Amer: >60 mL/min (ref >60)
Glucose, Bld: 106 mg/dL — ABNORMAL HIGH (ref 70–99)
Potassium: 4 mmol/L (ref 3.5–5.1)
Sodium: 138 mmol/L (ref 135–145)
Total Bilirubin: 0.6 mg/dL (ref 0.3–1.2)
Total Protein: 6.4 g/dL — ABNORMAL LOW (ref 6.5–8.1)

## 2019-04-26 LAB — GLUCOSE, CAPILLARY
Glucose-Capillary: 102 mg/dL — ABNORMAL HIGH (ref 70–99)
Glucose-Capillary: 109 mg/dL — ABNORMAL HIGH (ref 70–99)
Glucose-Capillary: 122 mg/dL — ABNORMAL HIGH (ref 70–99)
Glucose-Capillary: 132 mg/dL — ABNORMAL HIGH (ref 70–99)
Glucose-Capillary: 92 mg/dL (ref 70–99)
Glucose-Capillary: 96 mg/dL (ref 70–99)

## 2019-04-26 LAB — CBC
HCT: 32.2 % — ABNORMAL LOW (ref 39.0–52.0)
Hemoglobin: 9.9 g/dL — ABNORMAL LOW (ref 13.0–17.0)
MCH: 26.8 pg (ref 26.0–34.0)
MCHC: 30.7 g/dL (ref 30.0–36.0)
MCV: 87 fL (ref 80.0–100.0)
Platelets: 319 10*3/uL (ref 150–400)
RBC: 3.7 MIL/uL — ABNORMAL LOW (ref 4.22–5.81)
RDW: 14.2 % (ref 11.5–15.5)
WBC: 11.5 10*3/uL — ABNORMAL HIGH (ref 4.0–10.5)
nRBC: 0 % (ref 0.0–0.2)

## 2019-04-26 LAB — MAGNESIUM: Magnesium: 2 mg/dL (ref 1.7–2.4)

## 2019-04-26 LAB — PROTIME-INR
INR: 1.8 — ABNORMAL HIGH (ref 0.8–1.2)
Prothrombin Time: 20.2 seconds — ABNORMAL HIGH (ref 11.4–15.2)

## 2019-04-26 LAB — HEPARIN LEVEL (UNFRACTIONATED): Heparin Unfractionated: 0.49 IU/mL (ref 0.30–0.70)

## 2019-04-26 MED ORDER — WARFARIN SODIUM 5 MG PO TABS
5.0000 mg | ORAL_TABLET | Freq: Once | ORAL | Status: AC
  Administered 2019-04-26: 18:00:00 5 mg via ORAL
  Filled 2019-04-26: qty 1

## 2019-04-26 MED ORDER — SPIRONOLACTONE 25 MG PO TABS
25.0000 mg | ORAL_TABLET | Freq: Every day | ORAL | Status: DC
  Administered 2019-04-26 – 2019-04-29 (×4): 25 mg via ORAL
  Filled 2019-04-26 (×4): qty 1

## 2019-04-26 NOTE — Progress Notes (Signed)
[place] for Heparin > warfarin Indication: chest pain/ACS/new Afib  Allergies  Allergen Reactions  . Bactrim [Sulfamethoxazole-Trimethoprim] Other (See Comments)    Listed on [facility]    Patient Measurements: Height: 6' 2.02" (188 cm) Weight: 189 lb 9.5 oz (86 kg) IBW/kg (Calculated) : 82.24  Heparin Dosing Wt: 90.7 kg  Vital Signs: Temp: 98.1 F (36.7 C) (07/24 0543) Temp Source: Oral (07/24 0543) BP: 148/64 (07/24 0543) Pulse Rate: 71 (07/24 0543)  Labs: Recent Labs    04/24/19 0349 04/25/19 0520 04/26/19 0500  HGB 10.6* 10.5* 9.9*  HCT 33.3* 33.0* 32.2*  PLT 276 312 319  LABPROT 14.1 16.3* 20.2*  INR 1.1 1.3* 1.8*  HEPARINUNFRC 0.42 0.38 0.49  CREATININE 1.07 1.05 1.08    Estimated Creatinine Clearance: 67.7 mL/min (by C-G formula based on SCr of 1.08 mg/dL).  Assessment: 78 yr old male presenting with SOB and fever. EKG changes showing STEMI. Patient was being medically managed as he is not a candidate for interventional therapy. New afib on 7/20, started amiodarone short-term but now discontinued. No anticoagulation PTA. Pharmacy consulted to dose warfarin with heparin bridge.  Heparin level (0.49) remains therapeutic and INR (1.8) is subtherapeutic, trending up after three doses of warfarin. CBC stable. No bleeding noted.  Goal of Therapy:  Heparin level 0.3-0.7 units/ml Monitor platelets by anticoagulation protocol: Yes   Plan:  Warfarin 5 mg x1 tonight Continue heparin to 1600 units/hr Daily heparin level and CBC, INR Monitor s/s bleeding  Richardine Service, PharmD PGY1 Pharmacy Resident Phone: [phone] 04/26/2019  8:29 AM  Please check [email or web address] for unit-specific pharmacy phone numbers.

## 2019-04-26 NOTE — Progress Notes (Signed)
Pharmacy Antibiotic Note  Luis Lyons is a 78 y.o. male admitted on 04/20/2019 with sepsis. Presented with acute respiratory distress (transfer from [facility]).  Pharmacy has been consulted for vancomycin dosing.  WBC 11.5, afebrile. Scr down from 1.69 to 1.08 now (CrCl ~68 mL/min).   Plan: Continue Vancomycin 1750 mg IV every 24 hours Monitor renal fx, cx results, clinical pic, and vanc levels as appropriate. Follow-up antibiotic plan pending repeat BCx  Height: 6' 2.02" (188 cm) Weight: 189 lb 9.5 oz (86 kg) IBW/kg (Calculated) : 82.24  Temp (24hrs), Avg:98.2 F (36.8 C), Min:98.1 F (36.7 C), Max:98.2 F (36.8 C)  Recent Labs  Lab 04/20/19 1911 04/20/19 2329 04/21/19 0609 04/22/19 0718 04/23/19 0814 04/24/19 0349 04/25/19 0520 04/26/19 0500  WBC 22.8*  --   --  27.7* 11.9* 9.8 9.7 11.5*  CREATININE 1.69*  --  1.66* 1.27*  --  1.07 1.05 1.08  LATICACIDVEN 2.1* 1.2  --   --   --   --   --   --     Estimated Creatinine Clearance: 67.7 mL/min (by C-G formula based on SCr of 1.08 mg/dL).    Allergies  Allergen Reactions  . Bactrim [Sulfamethoxazole-Trimethoprim] Other (See Comments)    Listed on MAR    Antimicrobials this admission: Meropenem 7/21 >>  Vanco 7/18 >>  Zosyn 7/18 >> 7/19 Ceftriaxone 7/19 >>7/21  Microbiology results: 7/22 BCx: ng <24h 7/18 BCx: GPC  7/18 BCID: MRSE 7/18 UCx: 100K ESBL Ecoli + 50K proteus 7/18 COVID PCR: neg 7/19 MRSA PCR: neg  Richardine Service, PharmD PGY1 Pharmacy Resident Phone: [phone] 04/26/2019  8:42 AM  Please check [email or web address] for unit-specific pharmacy phone numbers.

## 2019-04-26 NOTE — Progress Notes (Signed)
PROGRESS NOTE    Luis Lyons  TIR:[id number] DOB: [birth year] DOA: 04/20/2019 PCP: Elson Clan, MD   Brief Narrative:  78 year old with history of essential hypertension, CVA with residual left-sided weaknessHe resides at a nursing home presented to the hospital with acute hypoxic respiratory failure diagnosed with bacterial pneumonia.  Hospital course complicated by ST elevations requiring cardiology evaluation.  Echocardiogram showed ejection fraction 25-30% and it was deemed that patient is not a candidate for left heart catheterization and recommended medical management.  Started on IV heparin which was transitioned to Coumadin.  Medication adjustments were made according to cardiology service.  Blood cultures positive for Staphylococcus hominis, currently on IV vancomycin.  Repeat cultures no growth.   Assessment & Plan:   Principal Problem:   Sepsis due to pneumonia [facility]) Active Problems:   Acute respiratory failure with hypoxia (HCC)   History of CVA (cerebrovascular accident)   Hyperglycemia   Hypokalemia   Renal insufficiency   Prolonged QT interval  Acute hypoxic respiratory failure likely aspiration pneumonia Staphylococcus hominis bacteremia - Speech therapy recommended dysphagia 2 diet -Continue IV vancomycin until repeat cultures are negative. - Bronchodilators as necessary.  Supportive care.  Supplemental oxygen as needed.  ST elevation MI -Medical management at this point.  On Coumadin and aspirin. INR 1.8 -On Coreg, Lipitor, Aldactone and losartan - On heparin drip transitioning to Coumadin.  History of prostate cancer status post prostatectomy -Followed at [facility].  ESBL E. coli, Proteus - Meropenem stop date 7/24. Last day today.   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  Therefore on a dysphagia diet.  Essential hypertension -Continue current  meds.  Cardiac medications been adjusted by cardiology service.  Acute kidney injury, resolved -Admission creatinine 1.69, trended down to 1.08.  DVT prophylaxis: Coumadin Code Status: DNR Family Communication: None at bedside Disposition Plan: Awaiting INR to become therapeutic over 2.0.  I am hopeful it will be therapeutic range tomorrow and will be able to discharge him to skilled nursing facility palliative care team to follow there.  Consultants:   Cardiology  Procedures:   None  Antimicrobials:   Vancomycin  Meropenem stopped today on 7/24   Subjective: Feels better, no complaints.  Remains afebrile.  Review of Systems Otherwise negative except as per HPI, including: General = no fevers, chills, dizziness, malaise, fatigue HEENT/EYES = negative for pain, redness, loss of vision, double vision, blurred vision, loss of hearing, sore throat, hoarseness, dysphagia Cardiovascular= negative for chest pain, palpitation, murmurs, lower extremity swelling Respiratory/lungs= negative for shortness of breath, cough, hemoptysis, wheezing, mucus production Gastrointestinal= negative for nausea, vomiting,, abdominal pain, melena, hematemesis Genitourinary= negative for Dysuria, Hematuria, Change in Urinary Frequency MSK = Negative for arthralgia, myalgias, Back Pain, Joint swelling  Neurology= Negative for headache, seizures, numbness, tingling  Psychiatry= Negative for anxiety, depression, suicidal and homocidal ideation Allergy/Immunology= Medication/Food allergy as listed  Skin= Negative for Rash, lesions, ulcers, itching   Objective: Vitals:   04/25/19 2000 04/25/19 2004 04/26/19 0543 04/26/19 1133  BP:  (!) 119/58 (!) 148/64 129/72  Pulse: 62 61 71 68  Resp: (!) 28   (!) 31  Temp:  98.2 F (36.8 C) 98.1 F (36.7 C) 97.8 F (36.6 C)  TempSrc:  Oral Oral Oral  SpO2: 93% 95% 95% 93%  Weight:   86 kg   Height:        Intake/Output Summary (Last 24 hours) at  04/26/2019 1330 Last data filed at 04/26/2019 1134 Gross per 24 hour  Intake 1453.95 ml  Output 900 ml  Net 553.95 ml   Filed Weights   04/23/19 0418 04/25/19 0653 04/26/19 0543  Weight: 81.6 kg 82.6 kg 86 kg    Examination:  Constitutional: NAD, calm, comfortable Eyes: PERRL, lids and conjunctivae normal ENMT: Previous surgical scars noted on his head and neck. Neck: normal, supple, no masses, no thyromegaly Respiratory: clear to auscultation bilaterally, no wheezing, no crackles. Normal respiratory effort. No accessory muscle use.  Cardiovascular: Regular rate and rhythm, no murmurs / rubs / gallops. No extremity edema. 2+ pedal pulses. No carotid bruits.  Abdomen: no tenderness, no masses palpated. No hepatosplenomegaly. Bowel sounds positive.  Multiple abdominal surgical scars noted Musculoskeletal: no clubbing / cyanosis. No joint deformity upper and lower extremities. Good ROM, no contractures. Normal muscle tone.  Skin: no rashes, lesions, ulcers. No induration Neurologic: CN 2-12 grossly intact. Sensation intact, DTR normal. Strength 4/5 in all 4.  Some dysarthria from his previous CVA Psychiatric: Normal judgment and insight. Alert and oriented x 3. Normal mood.   External Foley catheter in place. Left upper extremity midline in place.  Data Reviewed:   CBC: Recent Labs  Lab 04/20/19 1911 04/22/19 0718 04/23/19 0814 04/24/19 0349 04/25/19 0520 04/26/19 0500  WBC 22.8* 27.7* 11.9* 9.8 9.7 11.5*  NEUTROABS 20.7* 24.7*  --  7.0 7.3  --   HGB 11.7* 11.4* 11.2* 10.6* 10.5* 9.9*  HCT 37.9* 36.5* 35.3* 33.3* 33.0* 32.2*  MCV 87.7 87.1 86.5 86.0 85.7 87.0  PLT 321 319 286 276 312 811   Basic Metabolic Panel: Recent Labs  Lab 04/21/19 0609 04/22/19 0718 04/24/19 0349 04/25/19 0520 04/26/19 0500  NA 138 138 139 138 138  K 4.5 4.4 4.0 3.2* 4.0  CL 109 106 105 105 103  CO2 19* 25 25 25 27   GLUCOSE 175* 128* 115* 190* 106*  BUN 24* 25* 23 15 12   CREATININE 1.66*  1.27* 1.07 1.05 1.08  CALCIUM 8.6* 9.2 9.2 8.6* 8.8*  MG 2.5*  --  2.1 1.8 2.0  PHOS  --   --   --  2.6  --    GFR: Estimated Creatinine Clearance: 67.7 mL/min (by C-G formula based on SCr of 1.08 mg/dL). Liver Function Tests: Recent Labs  Lab 04/20/19 1911 04/21/19 0609 04/24/19 0349 04/25/19 0520 04/26/19 0500  AST 26 45* 19  --  20  ALT 15 17 21   --  20  ALKPHOS 86 67 62  --  57  BILITOT 1.2 0.5 0.4  --  0.6  PROT 8.2* 7.4 6.7  --  6.4*  ALBUMIN 2.8* 2.4* 2.3* 2.1* 2.1*   No results for input(s): LIPASE, AMYLASE in the last 168 hours. No results for input(s): AMMONIA in the last 168 hours. Coagulation Profile: Recent Labs  Lab 04/20/19 1911 04/24/19 0349 04/25/19 0520 04/26/19 0500  INR 1.2 1.1 1.3* 1.8*   Cardiac Enzymes: No results for input(s): CKTOTAL, CKMB, CKMBINDEX, TROPONINI in the last 168 hours. BNP (last 3 results) No results for input(s): PROBNP in the last 8760 hours. HbA1C: No results for input(s): HGBA1C in the last 72 hours. CBG: Recent Labs  Lab 04/25/19 2006 04/26/19 0003 04/26/19 0414 04/26/19 0816 04/26/19 1127  GLUCAP 110* 96 102* 92 109*   Lipid Profile: No results for input(s): CHOL, HDL, LDLCALC, TRIG, CHOLHDL, LDLDIRECT in the last 72 hours. Thyroid Function Tests: Recent Labs    04/24/19 0349  TSH 1.447   Anemia Panel: No results for input(s): VITAMINB12, FOLATE, FERRITIN, TIBC, IRON, RETICCTPCT in the last 72 hours. Sepsis Labs: Recent Labs  Lab 04/20/19 1911 04/20/19 2329  LATICACIDVEN 2.1* 1.2    Recent Results (from the past 240 hour(s))  Blood Culture (routine x 2)     Status: Abnormal   Collection Time: 04/20/19  7:20 PM   Specimen: BLOOD LEFT WRIST  Result Value Ref Range Status   Specimen Description BLOOD LEFT WRIST  Final   Special Requests   Final    BOTTLES DRAWN AEROBIC AND ANAEROBIC Blood Culture adequate volume   Culture  Setup Time   Final    GRAM POSITIVE COCCI IN BOTH AEROBIC AND ANAEROBIC  BOTTLES CRITICAL RESULT CALLED TO, READ BACK BY AND VERIFIED WITH: PHARMD T [place] [id number] AT 1408 BY CM    Culture (A)  Final    STAPHYLOCOCCUS HOMINIS SUSCEPTIBILITIES PERFORMED ON PREVIOUS CULTURE WITHIN THE LAST 5 DAYS. WITH MULTIPLE  STAPHYLOCOCCUS SPECIES (COAGULASE NEGATIVE) THE SIGNIFICANCE OF ISOLATING THIS ORGANISM FROM A SINGLE SET OF BLOOD CULTURES WHEN MULTIPLE SETS ARE DRAWN IS UNCERTAIN. PLEASE NOTIFY THE MICROBIOLOGY DEPARTMENT WITHIN ONE WEEK IF SPECIATION AND SENSITIVITIES ARE REQUIRED. Performed at [facility], [place] [address] [place], [place] [address]    Report Status 04/24/2019 FINAL  Final  Blood Culture ID Panel (Reflexed)     Status: Abnormal   Collection Time: 04/20/19  7:20 PM  Result Value Ref Range Status   Enterococcus species NOT DETECTED NOT DETECTED Final   Listeria monocytogenes NOT DETECTED NOT DETECTED Final   Staphylococcus species DETECTED (A) NOT DETECTED Final    Comment: Methicillin (oxacillin) resistant coagulase negative staphylococcus. Possible blood culture contaminant (unless isolated from more than one blood culture draw or clinical case suggests pathogenicity). No antibiotic treatment is indicated for blood  culture contaminants. CRITICAL RESULT CALLED TO, READ BACK BY AND VERIFIED WITH: PHARMD T [place] [id number] AT 1408 BY CM    Staphylococcus aureus (BCID) NOT DETECTED NOT DETECTED Final   Methicillin resistance DETECTED (A) NOT DETECTED Final    Comment: CRITICAL RESULT CALLED TO, READ BACK BY AND VERIFIED WITH: PHARMD T BAUMEYSTER [id number] AT 1408 BY CM    Streptococcus species NOT DETECTED NOT DETECTED Final   Streptococcus agalactiae NOT DETECTED NOT DETECTED Final   Streptococcus pneumoniae NOT DETECTED NOT DETECTED Final   Streptococcus pyogenes NOT DETECTED NOT DETECTED Final   Acinetobacter baumannii NOT DETECTED NOT DETECTED Final   Enterobacteriaceae species NOT DETECTED NOT DETECTED Final   Enterobacter cloacae  complex NOT DETECTED NOT DETECTED Final   Escherichia coli NOT DETECTED NOT DETECTED Final   Klebsiella oxytoca NOT DETECTED NOT DETECTED Final   Klebsiella pneumoniae NOT DETECTED NOT DETECTED Final   Proteus species NOT DETECTED NOT DETECTED Final   Serratia marcescens NOT DETECTED NOT DETECTED Final   Haemophilus influenzae NOT DETECTED NOT DETECTED Final   Neisseria meningitidis NOT DETECTED NOT DETECTED Final   Pseudomonas aeruginosa NOT DETECTED NOT DETECTED Final   Candida albicans NOT DETECTED NOT DETECTED Final   Candida glabrata NOT DETECTED NOT DETECTED Final   Candida krusei NOT DETECTED NOT DETECTED Final   Candida parapsilosis NOT DETECTED NOT DETECTED Final   Candida tropicalis NOT DETECTED NOT DETECTED Final    Comment: Performed at [facility], [address] [place], [place] [address]  Blood Culture (routine x 2)     Status: Abnormal   Collection Time: 04/20/19  7:23  PM   Specimen: BLOOD  Result Value Ref Range Status   Specimen Description BLOOD RIGHT ANTECUBITAL  Final   Special Requests   Final    BOTTLES DRAWN AEROBIC AND ANAEROBIC Blood Culture adequate volume   Culture  Setup Time   Final    GRAM POSITIVE COCCI IN BOTH AEROBIC AND ANAEROBIC BOTTLES CRITICAL RESULT CALLED TO, READ BACK BY AND VERIFIED WITH: [place] [id number] AT 1408 BY CM Performed at [facility], [place] [address] [place], [place] [address]    Culture STAPHYLOCOCCUS HOMINIS (A)  Final   Report Status 04/24/2019 FINAL  Final   Organism ID, Bacteria STAPHYLOCOCCUS HOMINIS  Final      Susceptibility   Staphylococcus hominis - MIC*    CIPROFLOXACIN >=8 RESISTANT Resistant     ERYTHROMYCIN >=8 RESISTANT Resistant     GENTAMICIN <=0.5 SENSITIVE Sensitive     OXACILLIN >=4 RESISTANT Resistant     TETRACYCLINE <=1 SENSITIVE Sensitive     VANCOMYCIN <=0.5 SENSITIVE Sensitive     TRIMETH/SULFA <=10 SENSITIVE Sensitive     CLINDAMYCIN RESISTANT Resistant     RIFAMPIN <=0.5  SENSITIVE Sensitive     Inducible Clindamycin POSITIVE Resistant     * STAPHYLOCOCCUS HOMINIS  SARS Coronavirus 2 (CEPHEID- Performed in [place] hospital lab), Hosp Order     Status: None   Collection Time: 04/20/19  7:58 PM   Specimen: Nasopharyngeal Swab  Result Value Ref Range Status   SARS Coronavirus 2 NEGATIVE NEGATIVE Final    Comment: (NOTE) If result is NEGATIVE SARS-CoV-2 target nucleic acids are NOT DETECTED. The SARS-CoV-2 RNA is generally detectable in upper and lower  respiratory specimens during the acute phase of infection. The lowest  concentration of SARS-CoV-2 viral copies this assay can detect is 250  copies / mL. A negative result does not preclude SARS-CoV-2 infection  and should not be used as the sole basis for treatment or other  patient management decisions.  A negative result may occur with  improper specimen collection / handling, submission of specimen other  than nasopharyngeal swab, presence of viral mutation(s) within the  areas targeted by this assay, and inadequate number of viral copies  (<250 copies / mL). A negative result must be combined with clinical  observations, patient history, and epidemiological information. If result is POSITIVE SARS-CoV-2 target nucleic acids are DETECTED. The SARS-CoV-2 RNA is generally detectable in upper and lower  respiratory specimens dur ing the acute phase of infection.  Positive  results are indicative of active infection with SARS-CoV-2.  Clinical  correlation with patient history and other diagnostic information is  necessary to determine patient infection status.  Positive results do  not rule out bacterial infection or co-infection with other viruses. If result is PRESUMPTIVE POSTIVE SARS-CoV-2 nucleic acids MAY BE PRESENT.   A presumptive positive result was obtained on the submitted specimen  and confirmed on repeat testing.  While 2019 novel coronavirus  (SARS-CoV-2) nucleic acids may be present in  the submitted sample  additional confirmatory testing may be necessary for epidemiological  and / or clinical management purposes  to differentiate between  SARS-CoV-2 and other Sarbecovirus currently known to infect humans.  If clinically indicated additional testing with an alternate test  methodology [id number]) is advised. The SARS-CoV-2 RNA is generally  detectable in upper and lower respiratory sp ecimens during the acute  phase of infection. The expected result is Negative. Fact Sheet for Patients:  StrictlyIdeas.no Fact Sheet for  Healthcare Providers: BankingDealers.co.za This test is not yet approved or cleared by the [place] and has been authorized for detection and/or diagnosis of SARS-CoV-2 by FDA under an Emergency Use Authorization (EUA).  This EUA will remain in effect (meaning this test can be used) for the duration of the COVID-19 declaration under Section 564(b)(1) of the Act, 21 U.S.C. section 360bbb-3(b)(1), unless the authorization is terminated or revoked sooner. Performed at [facility], [place] [address], [place], [place] [address]   Urine culture     Status: Abnormal   Collection Time: 04/20/19  8:28 PM   Specimen: In/Out Cath Urine  Result Value Ref Range Status   Specimen Description IN/OUT CATH URINE  Final   Special Requests   Final    NONE Performed at [facility], [place] [address] [place], [place] [address]    Culture (A)  Final    >=100,000 COLONIES/mL ESCHERICHIA COLI 50,000 COLONIES/mL PROTEUS MIRABILIS ESCHERICHIA COLI is Confirmed Extended Spectrum Beta-Lactamase Producer (ESBL).  In bloodstream infections from ESBL organisms, carbapenems are preferred over piperacillin/tazobactam. They are shown to have a lower risk of mortality.    Report Status 04/23/2019 FINAL  Final   Organism ID, Bacteria ESCHERICHIA COLI (A)  Final   Organism ID, Bacteria PROTEUS MIRABILIS (A)  Final       Susceptibility   Escherichia coli - MIC*    AMPICILLIN >=32 RESISTANT Resistant     CEFAZOLIN >=64 RESISTANT Resistant     CEFTRIAXONE >=64 RESISTANT Resistant     CIPROFLOXACIN >=4 RESISTANT Resistant     GENTAMICIN 8 INTERMEDIATE Intermediate     IMIPENEM <=0.25 SENSITIVE Sensitive     NITROFURANTOIN <=16 SENSITIVE Sensitive     TRIMETH/SULFA >=320 RESISTANT Resistant     AMPICILLIN/SULBACTAM >=32 RESISTANT Resistant     PIP/TAZO 64 INTERMEDIATE Intermediate     Extended ESBL POSITIVE Resistant     * >=100,000 COLONIES/mL ESCHERICHIA COLI   Proteus mirabilis - MIC*    AMPICILLIN >=32 RESISTANT Resistant     CEFAZOLIN 8 SENSITIVE Sensitive     CEFTRIAXONE <=1 SENSITIVE Sensitive     CIPROFLOXACIN >=4 RESISTANT Resistant     GENTAMICIN 8 INTERMEDIATE Intermediate     IMIPENEM 1 SENSITIVE Sensitive     NITROFURANTOIN 256 RESISTANT Resistant     TRIMETH/SULFA >=320 RESISTANT Resistant     AMPICILLIN/SULBACTAM 8 SENSITIVE Sensitive     PIP/TAZO <=4 SENSITIVE Sensitive     * 50,000 COLONIES/mL PROTEUS MIRABILIS  MRSA PCR Screening     Status: None   Collection Time: 04/21/19  4:55 AM   Specimen: Nasopharyngeal  Result Value Ref Range Status   MRSA by PCR NEGATIVE NEGATIVE Final    Comment:        The GeneXpert MRSA Assay (FDA approved for NASAL specimens only), is one component of a comprehensive MRSA colonization surveillance program. It is not intended to diagnose MRSA infection nor to guide or monitor treatment for MRSA infections. Performed at [facility], [place] [address], [place], [place] [address]   Culture, blood (Routine X 2) w Reflex to ID Panel     Status: None (Preliminary result)   Collection Time: 04/24/19  2:54 PM   Specimen: BLOOD LEFT HAND  Result Value Ref Range Status   Specimen Description BLOOD LEFT HAND  Final   Special Requests   Final    BOTTLES DRAWN AEROBIC AND ANAEROBIC Blood Culture results may not be optimal due to an  excessive volume of  blood received in culture bottles   Culture   Final    NO GROWTH 2 DAYS Performed at [facility], [place] [address] [place], [place] [address]    Report Status PENDING  Incomplete  Culture, blood (Routine X 2) w Reflex to ID Panel     Status: None (Preliminary result)   Collection Time: 04/24/19  3:03 PM   Specimen: BLOOD  Result Value Ref Range Status   Specimen Description BLOOD RIGHT ANTECUBITAL  Final   Special Requests   Final    BOTTLES DRAWN AEROBIC AND ANAEROBIC Blood Culture results may not be optimal due to an excessive volume of blood received in culture bottles   Culture   Final    NO GROWTH 2 DAYS Performed at [facility], [place] [address] [place], [place] [address]    Report Status PENDING  Incomplete         Radiology Studies: No results found.      Scheduled Meds: . aspirin  81 mg Oral Daily  . atorvastatin  80 mg Oral q1800  . carvedilol  6.25 mg Oral BID WC  . coumadin book   Does not apply Once  . furosemide  40 mg Oral QODAY  . insulin aspart  0-9 Units Subcutaneous Q4H  . losartan  25 mg Oral Daily  . sodium chloride flush  10-40 mL Intracatheter Q12H  . sodium chloride flush  3 mL Intravenous Q12H  . spironolactone  25 mg Oral Daily  . warfarin  5 mg Oral ONCE-1800  . Warfarin - Pharmacist Dosing Inpatient   Does not apply q1800   Continuous Infusions: . heparin 1,600 Units/hr (04/25/19 1520)  . meropenem (MERREM) IV Stopped (04/26/19 0558)  . vancomycin 1,750 mg (04/25/19 1800)     LOS: 6 days   Time spent= 35 mins    Zackrey Dyar Arsenio Loader, MD [facility]  If 7PM-7AM, please contact night-coverage [email or web address] 04/26/2019, 1:30 PM

## 2019-04-26 NOTE — Progress Notes (Signed)
    Maintaining sinus rhythm. Continue carvedilol 6.25 mg twice daily plus ARB. Blood pressure would likely tolerate increasing to spironolactone 25 mg daily. ->  Have changed to 25 mg.  Currently seems relatively stable overall from a cardiac standpoint we will monitor and be available for assistance if required.  If discharge is pending today, will be available.  Recommendations for discharge planning and to establish follow-up.  If not, will round tomorrow   Glenetta Hew, MD

## 2019-04-27 LAB — COMPREHENSIVE METABOLIC PANEL
ALT: 24 U/L (ref 0–44)
AST: 21 U/L (ref 15–41)
Albumin: 2.2 g/dL — ABNORMAL LOW (ref 3.5–5.0)
Alkaline Phosphatase: 60 U/L (ref 38–126)
Anion gap: 9 (ref 5–15)
BUN: 14 mg/dL (ref 8–23)
CO2: 27 mmol/L (ref 22–32)
Calcium: 8.8 mg/dL — ABNORMAL LOW (ref 8.9–10.3)
Chloride: 100 mmol/L (ref 98–111)
Creatinine, Ser: 1.03 mg/dL (ref 0.61–1.24)
GFR calc Af Amer: >60 mL/min (ref >60)
GFR calc non Af Amer: >60 mL/min (ref >60)
Glucose, Bld: 103 mg/dL — ABNORMAL HIGH (ref 70–99)
Potassium: 3.6 mmol/L (ref 3.5–5.1)
Sodium: 136 mmol/L (ref 135–145)
Total Bilirubin: 0.9 mg/dL (ref 0.3–1.2)
Total Protein: 6.6 g/dL (ref 6.5–8.1)

## 2019-04-27 LAB — MAGNESIUM: Magnesium: 2 mg/dL (ref 1.7–2.4)

## 2019-04-27 LAB — GLUCOSE, CAPILLARY
Glucose-Capillary: 102 mg/dL — ABNORMAL HIGH (ref 70–99)
Glucose-Capillary: 103 mg/dL — ABNORMAL HIGH (ref 70–99)
Glucose-Capillary: 109 mg/dL — ABNORMAL HIGH (ref 70–99)
Glucose-Capillary: 111 mg/dL — ABNORMAL HIGH (ref 70–99)
Glucose-Capillary: 114 mg/dL — ABNORMAL HIGH (ref 70–99)
Glucose-Capillary: 95 mg/dL (ref 70–99)

## 2019-04-27 LAB — PROTIME-INR
INR: 2.5 — ABNORMAL HIGH (ref 0.8–1.2)
Prothrombin Time: 26.7 seconds — ABNORMAL HIGH (ref 11.4–15.2)

## 2019-04-27 LAB — CBC
HCT: 33.1 % — ABNORMAL LOW (ref 39.0–52.0)
Hemoglobin: 10.3 g/dL — ABNORMAL LOW (ref 13.0–17.0)
MCH: 26.8 pg (ref 26.0–34.0)
MCHC: 31.1 g/dL (ref 30.0–36.0)
MCV: 86.2 fL (ref 80.0–100.0)
Platelets: 344 10*3/uL (ref 150–400)
RBC: 3.84 MIL/uL — ABNORMAL LOW (ref 4.22–5.81)
RDW: 14.5 % (ref 11.5–15.5)
WBC: 12.2 10*3/uL — ABNORMAL HIGH (ref 4.0–10.5)
nRBC: 0 % (ref 0.0–0.2)

## 2019-04-27 LAB — HEPARIN LEVEL (UNFRACTIONATED): Heparin Unfractionated: 0.61 IU/mL (ref 0.30–0.70)

## 2019-04-27 MED ORDER — FUROSEMIDE 40 MG PO TABS: 40.0000 mg | ORAL_TABLET | ORAL | Status: AC

## 2019-04-27 MED ORDER — SPIRONOLACTONE 25 MG PO TABS: 25.0000 mg | ORAL_TABLET | Freq: Every day | ORAL | Status: AC

## 2019-04-27 MED ORDER — LOSARTAN POTASSIUM 25 MG PO TABS: 25.0000 mg | ORAL_TABLET | Freq: Every day | ORAL | Status: AC

## 2019-04-27 MED ORDER — WARFARIN SODIUM 2.5 MG PO TABS
2.5000 mg | ORAL_TABLET | Freq: Once | ORAL | Status: AC
  Administered 2019-04-27: 2.5 mg via ORAL
  Filled 2019-04-27: qty 1

## 2019-04-27 MED ORDER — ATORVASTATIN CALCIUM 80 MG PO TABS: 80.0000 mg | ORAL_TABLET | Freq: Every day | ORAL | Status: AC

## 2019-04-27 MED ORDER — CLOPIDOGREL BISULFATE 75 MG PO TABS
75.0000 mg | ORAL_TABLET | Freq: Every day | ORAL | Status: DC
  Administered 2019-04-27 – 2019-04-29 (×3): 75 mg via ORAL
  Filled 2019-04-27 (×3): qty 1

## 2019-04-27 MED ORDER — HYDROCODONE-ACETAMINOPHEN 5-325 MG PO TABS: 1.0000 | ORAL_TABLET | Freq: Four times a day (QID) | ORAL | 0 refills | Status: AC | PRN

## 2019-04-27 MED ORDER — CLOPIDOGREL BISULFATE 75 MG PO TABS: 75.0000 mg | ORAL_TABLET | Freq: Every day | ORAL | Status: AC

## 2019-04-27 MED ORDER — WARFARIN SODIUM 2.5 MG PO TABS: 2.5000 mg | ORAL_TABLET | Freq: Every day | ORAL | Status: DC

## 2019-04-27 MED ORDER — CARVEDILOL 6.25 MG PO TABS: 6.2500 mg | ORAL_TABLET | Freq: Two times a day (BID) | ORAL | Status: AC

## 2019-04-27 NOTE — Progress Notes (Deleted)
[place] for Heparin > warfarin Indication: chest pain/ACS/new Afib  Allergies  Allergen Reactions  . Bactrim [Sulfamethoxazole-Trimethoprim] Other (See Comments)    Listed on [facility]    Patient Measurements: Height: 6' 2.02" (188 cm) Weight: 187 lb 2.7 oz (84.9 kg) IBW/kg (Calculated) : 82.24  Heparin Dosing Wt: 90.7 kg  Vital Signs: Temp: 99.1 F (37.3 C) (07/25 0743) Temp Source: Oral (07/25 0743) BP: 158/65 (07/25 0743) Pulse Rate: 72 (07/25 0743)  Labs: Recent Labs    04/25/19 0520 04/26/19 0500 04/27/19 0411  HGB 10.5* 9.9* 10.3*  HCT 33.0* 32.2* 33.1*  PLT 312 319 344  LABPROT 16.3* 20.2* 26.7*  INR 1.3* 1.8* 2.5*  HEPARINUNFRC 0.38 0.49 0.61  CREATININE 1.05 1.08 1.03    Estimated Creatinine Clearance: 70.9 mL/min (by C-G formula based on SCr of 1.03 mg/dL).  Assessment: 78 yr old male presenting with SOB and fever. EKG changes showing STEMI. Patient was being medically managed as he is not a candidate for interventional therapy. New afib on 7/20, started amiodarone short-term but now discontinued. No anticoagulation PTA. Pharmacy consulted to dose warfarin with heparin bridge.  - HL 0.61 (therapeutic) - INR 1.8>>2.5 (now therapeutic) - No bleeding noted per RN - Hgb 10.3, Plt 344 (stable)  Goal of Therapy:  INR goal (2-3) Monitor platelets by anticoagulation protocol: Yes   Plan:  - Will discontinue heparin now that INR therapeutic  - Warfarin 4 mg x1 tonight - Daily INR - Monitor s/sx bleeding  Agnes Lawrence, PharmD PGY1 Pharmacy Resident [phone]

## 2019-04-27 NOTE — Progress Notes (Signed)
Luis Lyons for Warfarin Indication: chest pain/ACS/new Afib  Allergies  Allergen Reactions  . Bactrim [Sulfamethoxazole-Trimethoprim] Other (See Comments)    Listed on [facility]    Patient Measurements: Height: 6' 2.02" (188 cm) Weight: 187 lb 2.7 oz (84.9 kg) IBW/kg (Calculated) : 82.24  Heparin Dosing Wt: 90.7 kg  Vital Signs: Temp: 99.1 F (37.3 C) (07/25 0743) Temp Source: Oral (07/25 0743) BP: 158/65 (07/25 0743) Pulse Rate: 72 (07/25 0743)  Labs: Recent Labs    04/25/19 0520 04/26/19 0500 04/27/19 0411  HGB 10.5* 9.9* 10.3*  HCT 33.0* 32.2* 33.1*  PLT 312 319 344  LABPROT 16.3* 20.2* 26.7*  INR 1.3* 1.8* 2.5*  HEPARINUNFRC 0.38 0.49 0.61  CREATININE 1.05 1.08 1.03    Estimated Creatinine Clearance: 70.9 mL/min (by C-G formula based on SCr of 1.03 mg/dL).  Assessment: 78 yr old male presenting with SOB and fever. EKG changes showing STEMI. Patient was being medically managed as he is not a candidate for interventional therapy. New afib on 7/20, started amiodarone short-term but now discontinued. No anticoagulation PTA. Pharmacy consulted to dose warfarin with heparin bridge.  Heparin level (0.61) remains therapeutic. INR (2.5) is therapeutic, trending up after four doses of warfarin. CBC stable. No bleeding noted. Will lower warfarin dose tonight to try to avoid significant increase in INR.  Goal of Therapy:  INR 2-3 Monitor platelets by anticoagulation protocol: Yes   Plan:  Warfarin 2.5 mg x1 tonight Discontinue heparin Daily PT/INR Monitor s/s bleeding  Richardine Service, PharmD PGY1 Pharmacy Resident Phone: [phone] 04/27/2019  8:40 AM  Please check [email or web address] for unit-specific pharmacy phone numbers.

## 2019-04-27 NOTE — Discharge Summary (Addendum)
Physician Discharge Summary  Luis Lyons FFM:[id number] DOB: 03-08-1941 DOA: 04/20/2019  PCP: Elson Clan, MD  Admit date: 04/20/2019 Discharge date: 04/29/19 Admitted From: SNF Disposition: SNF  Recommendations for Outpatient Follow-up:  1. Follow up with PCP in 1-2 weeks 2. Please obtain BMP/CBC in one week your next doctors visit.  3. Continue Coumadin, check INR level 04/29/2019.  INR goal 2.0-3.0. 4. Cardiac medications- carvedilol 6.25 mg twice daily, Lasix 40 mg daily, losartan 25 mg daily, spironolactone 25 mg daily. 5. Lipitor daily. 6. Follow-up outpatient with cardiology, follow-up appointment to be made by their service. 7. ASA discontinued. Coumadin and Plavix for now. Future long term plan per Cardiology.    Discharge Condition: Stable CODE STATUS: DNR Diet recommendation: Cardiac  Brief/Interim Summary: 78 year old with history of essential hypertension, CVA with residual left-sided weaknessHe resides at a nursing home presented to the hospital with acute hypoxic respiratory failure diagnosed with bacterial pneumonia.  Hospital course complicated by ST elevations requiring cardiology evaluation.  Echocardiogram showed ejection fraction 25-30% and it was deemed that patient is not a candidate for left heart catheterization and recommended medical management.  Started on IV heparin which was transitioned to Coumadin.  Medication adjustments were made according to cardiology service.  Blood cultures positive for Staphylococcus hominis, currently on IV vancomycin.  Repeat cultures no growth.  During the hospitalization patient completed course for meropenem for ESBL.  Also completed course with IV vancomycin for Staphylococcus bacteremia.  Repeat cultures remain negative.  Speech and swallow therapist recommended dysphagia 2 diet. Final medication adjustments were made by cardiac service-recommendations as stated above. INR is therapeutic today, stable for discharge with outpatient  follow-up recommendations as stated above.  Doing well on the day of discharge.   Discharge Diagnoses:  Principal Problem:   Sepsis due to pneumonia [facility]) Active Problems:   Acute respiratory failure with hypoxia (HCC)   History of CVA (cerebrovascular accident)   Hyperglycemia   Hypokalemia   Renal insufficiency   Prolonged QT interval   Acute hypoxic respiratory failure likely aspiration pneumonia Staphylococcus hominis bacteremia, resolved -  Recommending dysphagia 2 diet -Repeat cultures remain negative, will discontinue vancomycin upon discharge. - Bronchodilators as necessary.  Supportive care.  Supplemental oxygen as needed.  ST elevation MI -Medical management at this point.  On Coumadin and aspirin.  INR is 2.5.  Goal INR 2.0-3.0. Cont Plavix. Stop ASA Per Cardiology. -On Coreg, Lipitor, Aldactone and losartan -  Discontinue heparin drip today.  History of prostate cancer status post prostatectomy -Followed at [facility].  ESBL E. coli, Proteus - Meropenem stop date 7/24.  Completed course in the hospital.  Prior history of CVA with left-sided residual deficit0 stop ASA, now on Coumadin and Plavix.  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  Therefore on a dysphagia diet.  Essential hypertension -Continue current meds.  Cardiac medications been adjusted by cardiology service.  Acute kidney injury, resolved -Admission creatinine 1.69, trended down to 1.03  Given patient's high risk for aspiration, he remains at high risk for recurrent admission for pneumonia especially.  Consultations:  Cardiology  Subjective: Feels well, patient does not have any complaints today.  Discharge Exam: Vitals:   04/27/19 0946 04/27/19 1125  BP: (!) 158/65 (!) 149/68  Pulse: 71 65  Resp:  (!) 26  Temp:  98.9 F (37.2 C)  SpO2:  94%   Vitals:   04/27/19 0526 04/27/19 0743 04/27/19 0946 04/27/19 1125  BP: 136/72  Marland Kitchen)  158/65 (!) 158/65 (!) 149/68  Pulse: 72 72 71 65  Resp:  (!) 30  (!) 26  Temp: 97.8 F (36.6 C) 99.1 F (37.3 C)  98.9 F (37.2 C)  TempSrc: Oral Oral  Oral  SpO2: 96% 93%  94%  Weight: 84.9 kg     Height:        General: Pt is alert, awake, not in acute distress Cardiovascular: RRR, S1/S2 +, no rubs, no gallops Respiratory: Minimal bibasilar crackles. Abdominal: Soft, NT, ND, bowel sounds + Extremities: no edema, no cyanosis Old surgical facial scars noted around the mandibular region.  Discharge Instructions   Allergies as of 04/27/2019      Reactions   Bactrim [sulfamethoxazole-trimethoprim] Other (See Comments)   Listed on MAR      Medication List    STOP taking these medications   aspirin 81 MG chewable tablet     TAKE these medications   acetaminophen 325 MG tablet Commonly known as: TYLENOL Take 650 mg by mouth every 4 (four) hours as needed (temp 100.4 or greater).   albuterol (2.5 MG/3ML) 0.083% nebulizer solution Commonly known as: PROVENTIL Take 2.5 mg by nebulization 2 (two) times daily as needed for wheezing or shortness of breath.   atorvastatin 80 MG tablet Commonly known as: LIPITOR Take 1 tablet (80 mg total) by mouth daily at 6 PM.   carvedilol 6.25 MG tablet Commonly known as: COREG Take 1 tablet (6.25 mg total) by mouth 2 (two) times daily with a meal.   chlorhexidine 0.12 % solution Commonly known as: PERIDEX 15 mLs by Mouth Rinse route 2 (two) times a day.   cholecalciferol 25 MCG (1000 UT) tablet Commonly known as: VITAMIN D3 Take 1,000 Units by mouth daily.   clopidogrel 75 MG tablet Commonly known as: PLAVIX Take 1 tablet (75 mg total) by mouth daily. Start taking on: April 28, 2019   coal tar 0.5 % shampoo Commonly known as: NEUTROGENA T-GEL Apply 1 application topically 2 (two) times a week. Tuesday and Friday   Delsym 30 MG/5ML liquid Generic drug: dextromethorphan Take 60 mg by mouth 2 (two) times daily as needed  for cough.   DentaGel 1.1 % Gel dental gel Generic drug: sodium fluoride Place 1 application onto teeth 2 (two) times daily.   docusate sodium 100 MG capsule Commonly known as: COLACE Take 200 mg by mouth daily.   fluticasone 50 MCG/ACT nasal spray Commonly known as: FLONASE Place 1 spray into both nostrils daily.   furosemide 40 MG tablet Commonly known as: LASIX Take 1 tablet (40 mg total) by mouth every other day. Start taking on: April 28, 2019   HYDROcodone-acetaminophen 5-325 MG tablet Commonly known as: NORCO/VICODIN Take 1 tablet by mouth every 6 (six) hours as needed for up to 3 days for severe pain.   ibuprofen 200 MG tablet Commonly known as: ADVIL Take 400 mg by mouth every 6 (six) hours as needed for moderate pain.   ipratropium-albuterol 0.5-2.5 (3) MG/3ML Soln Commonly known as: DUONEB Take 3 mLs by nebulization 4 (four) times daily.   losartan 25 MG tablet Commonly known as: COZAAR Take 1 tablet (25 mg total) by mouth daily. Start taking on: April 28, 2019   magnesium hydroxide 400 MG/5ML suspension Commonly known as: MILK OF MAGNESIA Take 30 mLs by mouth daily as needed for mild constipation.   oxybutynin 5 MG 24 hr tablet Commonly known as: DITROPAN-XL Take 5 mg by mouth at bedtime.   phenazopyridine 200  MG tablet Commonly known as: PYRIDIUM Take 200 mg by mouth 3 (three) times daily as needed for pain.   polyethylene glycol 17 g packet Commonly known as: MIRALAX / GLYCOLAX Take 17 g by mouth daily.   polyvinyl alcohol 1.4 % ophthalmic solution Commonly known as: LIQUIFILM TEARS Place 1 drop into both eyes 2 (two) times daily.   RISA-BID PROBIOTIC PO Take 2 tablets by mouth 2 (two) times a day.   senna 8.6 MG tablet Commonly known as: SENOKOT Take 1 tablet by mouth 2 (two) times daily as needed for constipation.   senna 8.6 MG tablet Commonly known as: SENOKOT Take 2 tablets by mouth at bedtime.   Simethicone 180 MG Caps Take 180 mg  by mouth 2 (two) times daily as needed (gas).   spironolactone 25 MG tablet Commonly known as: ALDACTONE Take 1 tablet (25 mg total) by mouth daily. Start taking on: April 28, 2019   vitamin B-12 1000 MCG tablet Commonly known as: CYANOCOBALAMIN Take 1,000 mcg by mouth daily.   warfarin 2.5 MG tablet Commonly known as: COUMADIN Take 1 tablet (2.5 mg total) by mouth daily at 6 PM.   zinc oxide 20 % ointment Apply 1 application topically 2 (two) times daily as needed (scrotum for redness).   zinc sulfate 220 (50 Zn) MG capsule Take 220 mg by mouth daily.       Contact information for follow-up providers    Elson Clan, MD. Schedule an appointment as soon as possible for a visit in 1 week(s).   Specialty: Family Medicine Contact information: [address] [place] [address] [phone]        Leonie Man, MD .   Specialty: Cardiology Contact information: [address] [place] [address] [phone]            Contact information for after-discharge care    Destination    [facility] .   Service: Skilled Nursing Contact information: [address] [place] [phone]                 Allergies  Allergen Reactions  . Bactrim [Sulfamethoxazole-Trimethoprim] Other (See Comments)    Listed on MAR    You were cared for by a hospitalist during your hospital stay. If you have any questions about your discharge medications or the care you received while you were in the hospital after you are discharged, you can call the unit and asked to speak with the hospitalist on call if the hospitalist that took care of you is not available. Once you are discharged, your primary care physician will handle any further medical issues. Please note that no refills for any discharge medications will be authorized once you are discharged, as it is imperative that you return to  your primary care physician (or establish a relationship with a primary care physician if you do not have one) for your aftercare needs so that they can reassess your need for medications and monitor your lab values.   Procedures/Studies: Dg Chest Port 1 View  Result Date: 04/24/2019 CLINICAL DATA:  Cough, hypertension, history stroke, former smoker EXAM: PORTABLE CHEST 1 VIEW COMPARISON:  Portable exam 1211 hours compared to 04/22/2019 FINDINGS: Normal heart size, mediastinal contours, and pulmonary vascularity. Atherosclerotic calcification aorta. Emphysematous changes with chronic elevation of RIGHT diaphragm with minimal RIGHT basilar atelectasis. Accentuated interstitial markings at both lung bases, unchanged. No definite pleural effusion  or pneumothorax. Bones demineralized. IMPRESSION: Persistent bibasilar opacities question infiltrate, unchanged. Electronically Signed   By: Lavonia Dana M.D.   On: 04/24/2019 14:11   Dg Chest Port 1 View  Result Date: 04/22/2019 CLINICAL DATA:  Shortness of breath EXAM: PORTABLE CHEST 1 VIEW COMPARISON:  04/22/2019 FINDINGS: Heart is borderline in size. Aortic atherosclerosis. Patchy opacities lung bases, improved since prior study. No effusions or acute bony abnormality. IMPRESSION: Patchy bibasilar airspace opacities, improving since prior study. Electronically Signed   By: Rolm Baptise M.D.   On: 04/22/2019 09:06   Dg Chest Port 1 View  Result Date: 04/22/2019 CLINICAL DATA:  Shortness of breath EXAM: PORTABLE CHEST 1 VIEW COMPARISON:  Two days ago FINDINGS: New Kerley lines. Asymmetric airspace density at the medial right base. No effusion or pneumothorax. Cardiomegaly. IMPRESSION: 1. New interstitial edema. 2. Continued concern for right base pneumonia. Electronically Signed   By: Monte Fantasia M.D.   On: 04/22/2019 05:16   Dg Chest Port 1 View  Result Date: 04/20/2019 CLINICAL DATA:  Respiratory distress EXAM: PORTABLE CHEST 1 VIEW COMPARISON:   September 02, 2018 FINDINGS: The patient is rotated which limits evaluation. There airspace opacities in the right middle lobe and at the left lung base. No pneumothorax. Pleuroparenchymal scarring is noted at the lung apices. The heart size is stable. Aortic calcifications are again noted. There is no acute osseous abnormality. No pneumothorax. IMPRESSION: Probable opacities involving the left lower lobe and right middle lobe concerning for pneumonia, aspiration, or atelectasis. Electronically Signed   By: Constance Holster M.D.   On: 04/20/2019 19:49     The results of significant diagnostics from this hospitalization (including imaging, microbiology, ancillary and laboratory) are listed below for reference.     Microbiology: Recent Results (from the past 240 hour(s))  Blood Culture (routine x 2)     Status: Abnormal   Collection Time: 04/20/19  7:20 PM   Specimen: BLOOD LEFT WRIST  Result Value Ref Range Status   Specimen Description BLOOD LEFT WRIST  Final   Special Requests   Final    BOTTLES DRAWN AEROBIC AND ANAEROBIC Blood Culture adequate volume   Culture  Setup Time   Final    GRAM POSITIVE COCCI IN BOTH AEROBIC AND ANAEROBIC BOTTLES CRITICAL RESULT CALLED TO, READ BACK BY AND VERIFIED WITH: PHARMD T BAUMEYSTER [id number] AT 54 BY CM    Culture (A)  Final    STAPHYLOCOCCUS HOMINIS SUSCEPTIBILITIES PERFORMED ON PREVIOUS CULTURE WITHIN THE LAST 5 DAYS. WITH MULTIPLE  STAPHYLOCOCCUS SPECIES (COAGULASE NEGATIVE) THE SIGNIFICANCE OF ISOLATING THIS ORGANISM FROM A SINGLE SET OF BLOOD CULTURES WHEN MULTIPLE SETS ARE DRAWN IS UNCERTAIN. PLEASE NOTIFY THE MICROBIOLOGY DEPARTMENT WITHIN ONE WEEK IF SPECIATION AND SENSITIVITIES ARE REQUIRED. Performed at [facility], [place] [address] [place],  [address]    Report Status 04/24/2019 FINAL  Final  Blood Culture ID Panel (Reflexed)     Status: Abnormal   Collection Time: 04/20/19  7:20 PM  Result Value Ref Range Status    Enterococcus species NOT DETECTED NOT DETECTED Final   Listeria monocytogenes NOT DETECTED NOT DETECTED Final   Staphylococcus species DETECTED (A) NOT DETECTED Final    Comment: Methicillin (oxacillin) resistant coagulase negative staphylococcus. Possible blood culture contaminant (unless isolated from more than one blood culture draw or clinical case suggests pathogenicity). No antibiotic treatment is indicated for blood  culture contaminants. CRITICAL RESULT CALLED TO, READ BACK BY AND VERIFIED WITH: Jaclyn Shaggy [id number] AT  1408 BY CM    Staphylococcus aureus (BCID) NOT DETECTED NOT DETECTED Final   Methicillin resistance DETECTED (A) NOT DETECTED Final    Comment: CRITICAL RESULT CALLED TO, READ BACK BY AND VERIFIED WITH: PHARMD T BAUMEYSTER [id number] AT 1408 BY CM    Streptococcus species NOT DETECTED NOT DETECTED Final   Streptococcus agalactiae NOT DETECTED NOT DETECTED Final   Streptococcus pneumoniae NOT DETECTED NOT DETECTED Final   Streptococcus pyogenes NOT DETECTED NOT DETECTED Final   Acinetobacter baumannii NOT DETECTED NOT DETECTED Final   Enterobacteriaceae species NOT DETECTED NOT DETECTED Final   Enterobacter cloacae complex NOT DETECTED NOT DETECTED Final   Escherichia coli NOT DETECTED NOT DETECTED Final   Klebsiella oxytoca NOT DETECTED NOT DETECTED Final   Klebsiella pneumoniae NOT DETECTED NOT DETECTED Final   Proteus species NOT DETECTED NOT DETECTED Final   Serratia marcescens NOT DETECTED NOT DETECTED Final   Haemophilus influenzae NOT DETECTED NOT DETECTED Final   Neisseria meningitidis NOT DETECTED NOT DETECTED Final   Pseudomonas aeruginosa NOT DETECTED NOT DETECTED Final   Candida albicans NOT DETECTED NOT DETECTED Final   Candida glabrata NOT DETECTED NOT DETECTED Final   Candida krusei NOT DETECTED NOT DETECTED Final   Candida parapsilosis NOT DETECTED NOT DETECTED Final   Candida tropicalis NOT DETECTED NOT DETECTED Final    Comment: Performed at  [facility], [place] [address], [place], [place] [address]  Blood Culture (routine x 2)     Status: Abnormal   Collection Time: 04/20/19  7:23 PM   Specimen: BLOOD  Result Value Ref Range Status   Specimen Description BLOOD RIGHT ANTECUBITAL  Final   Special Requests   Final    BOTTLES DRAWN AEROBIC AND ANAEROBIC Blood Culture adequate volume   Culture  Setup Time   Final    GRAM POSITIVE COCCI IN BOTH AEROBIC AND ANAEROBIC BOTTLES CRITICAL RESULT CALLED TO, READ BACK BY AND VERIFIED WITH: [place] [id number] AT 1408 BY CM Performed at [facility], [place] [address], [place],  [address]    Culture STAPHYLOCOCCUS HOMINIS (A)  Final   Report Status 04/24/2019 FINAL  Final   Organism ID, Bacteria STAPHYLOCOCCUS HOMINIS  Final      Susceptibility   Staphylococcus hominis - MIC*    CIPROFLOXACIN >=8 RESISTANT Resistant     ERYTHROMYCIN >=8 RESISTANT Resistant     GENTAMICIN <=0.5 SENSITIVE Sensitive     OXACILLIN >=4 RESISTANT Resistant     TETRACYCLINE <=1 SENSITIVE Sensitive     VANCOMYCIN <=0.5 SENSITIVE Sensitive     TRIMETH/SULFA <=10 SENSITIVE Sensitive     CLINDAMYCIN RESISTANT Resistant     RIFAMPIN <=0.5 SENSITIVE Sensitive     Inducible Clindamycin POSITIVE Resistant     * STAPHYLOCOCCUS HOMINIS  SARS Coronavirus 2 (CEPHEID- Performed in [place] hospital lab), Hosp Order     Status: None   Collection Time: 04/20/19  7:58 PM   Specimen: Nasopharyngeal Swab  Result Value Ref Range Status   SARS Coronavirus 2 NEGATIVE NEGATIVE Final    Comment: (NOTE) If result is NEGATIVE SARS-CoV-2 target nucleic acids are NOT DETECTED. The SARS-CoV-2 RNA is generally detectable in upper and lower  respiratory specimens during the acute phase of infection. The lowest  concentration of SARS-CoV-2 viral copies this assay can detect is 250  copies / mL. A negative result does not preclude SARS-CoV-2 infection  and should not be used as the sole basis for treatment  or other  patient management decisions.  A negative result may occur with  improper specimen collection / handling, submission of specimen other  than nasopharyngeal swab, presence of viral mutation(s) within the  areas targeted by this assay, and inadequate number of viral copies  (<250 copies / mL). A negative result must be combined with clinical  observations, patient history, and epidemiological information. If result is POSITIVE SARS-CoV-2 target nucleic acids are DETECTED. The SARS-CoV-2 RNA is generally detectable in upper and lower  respiratory specimens dur ing the acute phase of infection.  Positive  results are indicative of active infection with SARS-CoV-2.  Clinical  correlation with patient history and other diagnostic information is  necessary to determine patient infection status.  Positive results do  not rule out bacterial infection or co-infection with other viruses. If result is PRESUMPTIVE POSTIVE SARS-CoV-2 nucleic acids MAY BE PRESENT.   A presumptive positive result was obtained on the submitted specimen  and confirmed on repeat testing.  While 2019 novel coronavirus  (SARS-CoV-2) nucleic acids may be present in the submitted sample  additional confirmatory testing may be necessary for epidemiological  and / or clinical management purposes  to differentiate between  SARS-CoV-2 and other Sarbecovirus currently known to infect humans.  If clinically indicated additional testing with an alternate test  methodology [phone]) is advised. The SARS-CoV-2 RNA is generally  detectable in upper and lower respiratory sp ecimens during the acute  phase of infection. The expected result is Negative. Fact Sheet for Patients:  StrictlyIdeas.no Fact Sheet for Healthcare Providers: BankingDealers.co.za This test is not yet approved or cleared by the [place] FDA and has been authorized for detection and/or diagnosis of  SARS-CoV-2 by FDA under an Emergency Use Authorization (EUA).  This EUA will remain in effect (meaning this test can be used) for the duration of the COVID-19 declaration under Section 564(b)(1) of the Act, 21 U.S.C. section 360bbb-3(b)(1), unless the authorization is terminated or revoked sooner. Performed at [facility], [place] [address] [place], [place] [address]   Urine culture     Status: Abnormal   Collection Time: 04/20/19  8:28 PM   Specimen: In/Out Cath Urine  Result Value Ref Range Status   Specimen Description IN/OUT CATH URINE  Final   Special Requests   Final    NONE Performed at [facility], [place] [address] [place], [place] [address]    Culture (A)  Final    >=100,000 COLONIES/mL ESCHERICHIA COLI 50,000 COLONIES/mL PROTEUS MIRABILIS ESCHERICHIA COLI is Confirmed Extended Spectrum Beta-Lactamase Producer (ESBL).  In bloodstream infections from ESBL organisms, carbapenems are preferred over piperacillin/tazobactam. They are shown to have a lower risk of mortality.    Report Status 04/23/2019 FINAL  Final   Organism ID, Bacteria ESCHERICHIA COLI (A)  Final   Organism ID, Bacteria PROTEUS MIRABILIS (A)  Final      Susceptibility   Escherichia coli - MIC*    AMPICILLIN >=32 RESISTANT Resistant     CEFAZOLIN >=64 RESISTANT Resistant     CEFTRIAXONE >=64 RESISTANT Resistant     CIPROFLOXACIN >=4 RESISTANT Resistant     GENTAMICIN 8 INTERMEDIATE Intermediate     IMIPENEM <=0.25 SENSITIVE Sensitive     NITROFURANTOIN <=16 SENSITIVE Sensitive     TRIMETH/SULFA >=320 RESISTANT Resistant     AMPICILLIN/SULBACTAM >=32 RESISTANT Resistant     PIP/TAZO 64 INTERMEDIATE Intermediate     Extended ESBL POSITIVE Resistant     * >=100,000 COLONIES/mL ESCHERICHIA COLI   Proteus  mirabilis - MIC*    AMPICILLIN >=32 RESISTANT Resistant     CEFAZOLIN 8 SENSITIVE Sensitive     CEFTRIAXONE <=1 SENSITIVE Sensitive     CIPROFLOXACIN >=4 RESISTANT Resistant     GENTAMICIN 8  INTERMEDIATE Intermediate     IMIPENEM 1 SENSITIVE Sensitive     NITROFURANTOIN 256 RESISTANT Resistant     TRIMETH/SULFA >=320 RESISTANT Resistant     AMPICILLIN/SULBACTAM 8 SENSITIVE Sensitive     PIP/TAZO <=4 SENSITIVE Sensitive     * 50,000 COLONIES/mL PROTEUS MIRABILIS  MRSA PCR Screening     Status: None   Collection Time: 04/21/19  4:55 AM   Specimen: Nasopharyngeal  Result Value Ref Range Status   MRSA by PCR NEGATIVE NEGATIVE Final    Comment:        The GeneXpert MRSA Assay (FDA approved for NASAL specimens only), is one component of a comprehensive MRSA colonization surveillance program. It is not intended to diagnose MRSA infection nor to guide or monitor treatment for MRSA infections. Performed at [facility], [place] [address], [place], [place] [address]   Culture, blood (Routine X 2) w Reflex to ID Panel     Status: None (Preliminary result)   Collection Time: 04/24/19  2:54 PM   Specimen: BLOOD LEFT HAND  Result Value Ref Range Status   Specimen Description BLOOD LEFT HAND  Final   Special Requests   Final    BOTTLES DRAWN AEROBIC AND ANAEROBIC Blood Culture results may not be optimal due to an excessive volume of blood received in culture bottles   Culture   Final    NO GROWTH 3 DAYS Performed at [facility], [place] [address] [place], [place] [address]    Report Status PENDING  Incomplete  Culture, blood (Routine X 2) w Reflex to ID Panel     Status: None (Preliminary result)   Collection Time: 04/24/19  3:03 PM   Specimen: BLOOD  Result Value Ref Range Status   Specimen Description BLOOD RIGHT ANTECUBITAL  Final   Special Requests   Final    BOTTLES DRAWN AEROBIC AND ANAEROBIC Blood Culture results may not be optimal due to an excessive volume of blood received in culture bottles   Culture   Final    NO GROWTH 3 DAYS Performed at [facility], [place] [address], [place], [place] [address]    Report Status PENDING  Incomplete      Labs: BNP (last 3 results) No results for input(s): BNP in the last 8760 hours. Basic Metabolic Panel: Recent Labs  Lab 04/21/19 0609 04/22/19 0718 04/24/19 0349 04/25/19 0520 04/26/19 0500 04/27/19 0411  NA 138 138 139 138 138 136  K 4.5 4.4 4.0 3.2* 4.0 3.6  CL 109 106 105 105 103 100  CO2 19* 25 25 25 27 27   GLUCOSE 175* 128* 115* 190* 106* 103*  BUN 24* 25* 23 15 12 14   CREATININE 1.66* 1.27* 1.07 1.05 1.08 1.03  CALCIUM 8.6* 9.2 9.2 8.6* 8.8* 8.8*  MG 2.5*  --  2.1 1.8 2.0 2.0  PHOS  --   --   --  2.6  --   --    Liver Function Tests: Recent Labs  Lab 04/20/19 1911 04/21/19 0609 04/24/19 0349 04/25/19 0520 04/26/19 0500 04/27/19 0411  AST 26 45* 19  --  20 21  ALT 15 17 21   --  20 24  ALKPHOS 86 67 62  --  57 60  BILITOT 1.2 0.5 0.4  --  0.6 0.9  PROT 8.2* 7.4 6.7  --  6.4* 6.6  ALBUMIN 2.8* 2.4* 2.3* 2.1* 2.1* 2.2*   No results for input(s): LIPASE, AMYLASE in the last 168 hours. No results for input(s): AMMONIA in the last 168 hours. CBC: Recent Labs  Lab 04/20/19 1911 04/22/19 0718 04/23/19 0814 04/24/19 0349 04/25/19 0520 04/26/19 0500 04/27/19 0411  WBC 22.8* 27.7* 11.9* 9.8 9.7 11.5* 12.2*  NEUTROABS 20.7* 24.7*  --  7.0 7.3  --   --   HGB 11.7* 11.4* 11.2* 10.6* 10.5* 9.9* 10.3*  HCT 37.9* 36.5* 35.3* 33.3* 33.0* 32.2* 33.1*  MCV 87.7 87.1 86.5 86.0 85.7 87.0 86.2  PLT 321 319 286 276 312 319 344   Cardiac Enzymes: No results for input(s): CKTOTAL, CKMB, CKMBINDEX, TROPONINI in the last 168 hours. BNP: Invalid input(s): POCBNP CBG: Recent Labs  Lab 04/26/19 2023 04/27/19 0014 04/27/19 0426 04/27/19 0740 04/27/19 1123  GLUCAP 132* 103* 109* 95 102*   D-Dimer No results for input(s): DDIMER in the last 72 hours. Hgb A1c No results for input(s): HGBA1C in the last 72 hours. Lipid Profile No results for input(s): CHOL, HDL, LDLCALC, TRIG, CHOLHDL, LDLDIRECT in the last 72 hours. Thyroid function studies No results for  input(s): TSH, T4TOTAL, T3FREE, THYROIDAB in the last 72 hours.  Invalid input(s): FREET3 Anemia work up No results for input(s): VITAMINB12, FOLATE, FERRITIN, TIBC, IRON, RETICCTPCT in the last 72 hours. Urinalysis    Component Value Date/Time   COLORURINE YELLOW 04/20/2019 2031   APPEARANCEUR CLOUDY (A) 04/20/2019 2031   LABSPEC 1.020 04/20/2019 2031   PHURINE 7.5 04/20/2019 2031   GLUCOSEU 100 (A) 04/20/2019 2031   HGBUR LARGE (A) 04/20/2019 2031   BILIRUBINUR SMALL (A) 04/20/2019 2031   KETONESUR 15 (A) 04/20/2019 2031   PROTEINUR >300 (A) 04/20/2019 2031   NITRITE POSITIVE (A) 04/20/2019 2031   LEUKOCYTESUR MODERATE (A) 04/20/2019 2031   Sepsis Labs Invalid input(s): PROCALCITONIN,  WBC,  LACTICIDVEN Microbiology Recent Results (from the past 240 hour(s))  Blood Culture (routine x 2)     Status: Abnormal   Collection Time: 04/20/19  7:20 PM   Specimen: BLOOD LEFT WRIST  Result Value Ref Range Status   Specimen Description BLOOD LEFT WRIST  Final   Special Requests   Final    BOTTLES DRAWN AEROBIC AND ANAEROBIC Blood Culture adequate volume   Culture  Setup Time   Final    GRAM POSITIVE COCCI IN BOTH AEROBIC AND ANAEROBIC BOTTLES CRITICAL RESULT CALLED TO, READ BACK BY AND VERIFIED WITH: PHARMD T [place] [id number] AT 1408 BY CM    Culture (A)  Final    STAPHYLOCOCCUS HOMINIS SUSCEPTIBILITIES PERFORMED ON PREVIOUS CULTURE WITHIN THE LAST 5 DAYS. WITH MULTIPLE  STAPHYLOCOCCUS SPECIES (COAGULASE NEGATIVE) THE SIGNIFICANCE OF ISOLATING THIS ORGANISM FROM A SINGLE SET OF BLOOD CULTURES WHEN MULTIPLE SETS ARE DRAWN IS UNCERTAIN. PLEASE NOTIFY THE MICROBIOLOGY DEPARTMENT WITHIN ONE WEEK IF SPECIATION AND SENSITIVITIES ARE REQUIRED. Performed at [facility], [place] [address] [place], [place] [address]    Report Status 04/24/2019 FINAL  Final  Blood Culture ID Panel (Reflexed)     Status: Abnormal   Collection Time: 04/20/19  7:20 PM  Result Value Ref Range Status    Enterococcus species NOT DETECTED NOT DETECTED Final   Listeria monocytogenes NOT DETECTED NOT DETECTED Final   Staphylococcus species DETECTED (A) NOT DETECTED Final    Comment: Methicillin (oxacillin) resistant coagulase negative  staphylococcus. Possible blood culture contaminant (unless isolated from more than one blood culture draw or clinical case suggests pathogenicity). No antibiotic treatment is indicated for blood  culture contaminants. CRITICAL RESULT CALLED TO, READ BACK BY AND VERIFIED WITH: PHARMD T BAUMEYSTER [id number] AT 1408 BY CM    Staphylococcus aureus (BCID) NOT DETECTED NOT DETECTED Final   Methicillin resistance DETECTED (A) NOT DETECTED Final    Comment: CRITICAL RESULT CALLED TO, READ BACK BY AND VERIFIED WITH: PHARMD T BAUMEYSTER [id number] AT 1408 BY CM    Streptococcus species NOT DETECTED NOT DETECTED Final   Streptococcus agalactiae NOT DETECTED NOT DETECTED Final   Streptococcus pneumoniae NOT DETECTED NOT DETECTED Final   Streptococcus pyogenes NOT DETECTED NOT DETECTED Final   Acinetobacter baumannii NOT DETECTED NOT DETECTED Final   Enterobacteriaceae species NOT DETECTED NOT DETECTED Final   Enterobacter cloacae complex NOT DETECTED NOT DETECTED Final   Escherichia coli NOT DETECTED NOT DETECTED Final   Klebsiella oxytoca NOT DETECTED NOT DETECTED Final   Klebsiella pneumoniae NOT DETECTED NOT DETECTED Final   Proteus species NOT DETECTED NOT DETECTED Final   Serratia marcescens NOT DETECTED NOT DETECTED Final   Haemophilus influenzae NOT DETECTED NOT DETECTED Final   Neisseria meningitidis NOT DETECTED NOT DETECTED Final   Pseudomonas aeruginosa NOT DETECTED NOT DETECTED Final   Candida albicans NOT DETECTED NOT DETECTED Final   Candida glabrata NOT DETECTED NOT DETECTED Final   Candida krusei NOT DETECTED NOT DETECTED Final   Candida parapsilosis NOT DETECTED NOT DETECTED Final   Candida tropicalis NOT DETECTED NOT DETECTED Final    Comment: Performed at  [facility], [address] [place], [place] [address]  Blood Culture (routine x 2)     Status: Abnormal   Collection Time: 04/20/19  7:23 PM   Specimen: BLOOD  Result Value Ref Range Status   Specimen Description BLOOD RIGHT ANTECUBITAL  Final   Special Requests   Final    BOTTLES DRAWN AEROBIC AND ANAEROBIC Blood Culture adequate volume   Culture  Setup Time   Final    GRAM POSITIVE COCCI IN BOTH AEROBIC AND ANAEROBIC BOTTLES CRITICAL RESULT CALLED TO, READ BACK BY AND VERIFIED WITH: [place] [id number] AT 1408 BY CM Performed at [facility], [place] [address] [place], [place] [address]    Culture STAPHYLOCOCCUS HOMINIS (A)  Final   Report Status 04/24/2019 FINAL  Final   Organism ID, Bacteria STAPHYLOCOCCUS HOMINIS  Final      Susceptibility   Staphylococcus hominis - MIC*    CIPROFLOXACIN >=8 RESISTANT Resistant     ERYTHROMYCIN >=8 RESISTANT Resistant     GENTAMICIN <=0.5 SENSITIVE Sensitive     OXACILLIN >=4 RESISTANT Resistant     TETRACYCLINE <=1 SENSITIVE Sensitive     VANCOMYCIN <=0.5 SENSITIVE Sensitive     TRIMETH/SULFA <=10 SENSITIVE Sensitive     CLINDAMYCIN RESISTANT Resistant     RIFAMPIN <=0.5 SENSITIVE Sensitive     Inducible Clindamycin POSITIVE Resistant     * STAPHYLOCOCCUS HOMINIS  SARS Coronavirus 2 (CEPHEID- Performed in [place] [facility] lab), Hosp Order     Status: None   Collection Time: 04/20/19  7:58 PM   Specimen: Nasopharyngeal Swab  Result Value Ref Range Status   SARS Coronavirus 2 NEGATIVE NEGATIVE Final    Comment: (NOTE) If result is NEGATIVE SARS-CoV-2 target nucleic acids are NOT DETECTED. The SARS-CoV-2 RNA is generally detectable in upper and lower  respiratory specimens during the acute  phase of infection. The lowest  concentration of SARS-CoV-2 viral copies this assay can detect is 250  copies / mL. A negative result does not preclude SARS-CoV-2 infection  and should not be used as the sole basis for treatment  or other  patient management decisions.  A negative result may occur with  improper specimen collection / handling, submission of specimen other  than nasopharyngeal swab, presence of viral mutation(s) within the  areas targeted by this assay, and inadequate number of viral copies  (<250 copies / mL). A negative result must be combined with clinical  observations, patient history, and epidemiological information. If result is POSITIVE SARS-CoV-2 target nucleic acids are DETECTED. The SARS-CoV-2 RNA is generally detectable in upper and lower  respiratory specimens dur ing the acute phase of infection.  Positive  results are indicative of active infection with SARS-CoV-2.  Clinical  correlation with patient history and other diagnostic information is  necessary to determine patient infection status.  Positive results do  not rule out bacterial infection or co-infection with other viruses. If result is PRESUMPTIVE POSTIVE SARS-CoV-2 nucleic acids MAY BE PRESENT.   A presumptive positive result was obtained on the submitted specimen  and confirmed on repeat testing.  While 2019 novel coronavirus  (SARS-CoV-2) nucleic acids may be present in the submitted sample  additional confirmatory testing may be necessary for epidemiological  and / or clinical management purposes  to differentiate between  SARS-CoV-2 and other Sarbecovirus currently known to infect humans.  If clinically indicated additional testing with an alternate test  methodology [id number]) is advised. The SARS-CoV-2 RNA is generally  detectable in upper and lower respiratory sp ecimens during the acute  phase of infection. The expected result is Negative. Fact Sheet for Patients:  StrictlyIdeas.no Fact Sheet for Healthcare Providers: BankingDealers.co.za This test is not yet approved or cleared by the [place] FDA and has been authorized for detection and/or diagnosis of  SARS-CoV-2 by FDA under an Emergency Use Authorization (EUA).  This EUA will remain in effect (meaning this test can be used) for the duration of the COVID-19 declaration under Section 564(b)(1) of the Act, 21 U.S.C. section 360bbb-3(b)(1), unless the authorization is terminated or revoked sooner. Performed at [facility], [place] [address] [place], [place] [address]   Urine culture     Status: Abnormal   Collection Time: 04/20/19  8:28 PM   Specimen: In/Out Cath Urine  Result Value Ref Range Status   Specimen Description IN/OUT CATH URINE  Final   Special Requests   Final    NONE Performed at [facility], [place] [address], [place], [place] [address]    Culture (A)  Final    >=100,000 COLONIES/mL ESCHERICHIA COLI 50,000 COLONIES/mL PROTEUS MIRABILIS ESCHERICHIA COLI is Confirmed Extended Spectrum Beta-Lactamase Producer (ESBL).  In bloodstream infections from ESBL organisms, carbapenems are preferred over piperacillin/tazobactam. They are shown to have a lower risk of mortality.    Report Status 04/23/2019 FINAL  Final   Organism ID, Bacteria ESCHERICHIA COLI (A)  Final   Organism ID, Bacteria PROTEUS MIRABILIS (A)  Final      Susceptibility   Escherichia coli - MIC*    AMPICILLIN >=32 RESISTANT Resistant     CEFAZOLIN >=64 RESISTANT Resistant     CEFTRIAXONE >=64 RESISTANT Resistant     CIPROFLOXACIN >=4 RESISTANT Resistant     GENTAMICIN 8 INTERMEDIATE Intermediate     IMIPENEM <=0.25 SENSITIVE Sensitive     NITROFURANTOIN <=16 SENSITIVE  Sensitive     TRIMETH/SULFA >=320 RESISTANT Resistant     AMPICILLIN/SULBACTAM >=32 RESISTANT Resistant     PIP/TAZO 64 INTERMEDIATE Intermediate     Extended ESBL POSITIVE Resistant     * >=100,000 COLONIES/mL ESCHERICHIA COLI   Proteus mirabilis - MIC*    AMPICILLIN >=32 RESISTANT Resistant     CEFAZOLIN 8 SENSITIVE Sensitive     CEFTRIAXONE <=1 SENSITIVE Sensitive     CIPROFLOXACIN >=4 RESISTANT Resistant     GENTAMICIN 8  INTERMEDIATE Intermediate     IMIPENEM 1 SENSITIVE Sensitive     NITROFURANTOIN 256 RESISTANT Resistant     TRIMETH/SULFA >=320 RESISTANT Resistant     AMPICILLIN/SULBACTAM 8 SENSITIVE Sensitive     PIP/TAZO <=4 SENSITIVE Sensitive     * 50,000 COLONIES/mL PROTEUS MIRABILIS  MRSA PCR Screening     Status: None   Collection Time: 04/21/19  4:55 AM   Specimen: Nasopharyngeal  Result Value Ref Range Status   MRSA by PCR NEGATIVE NEGATIVE Final    Comment:        The GeneXpert MRSA Assay (FDA approved for NASAL specimens only), is one component of a comprehensive MRSA colonization surveillance program. It is not intended to diagnose MRSA infection nor to guide or monitor treatment for MRSA infections. Performed at [facility], [place] [address], [place], [place] [address]   Culture, blood (Routine X 2) w Reflex to ID Panel     Status: None (Preliminary result)   Collection Time: 04/24/19  2:54 PM   Specimen: BLOOD LEFT HAND  Result Value Ref Range Status   Specimen Description BLOOD LEFT HAND  Final   Special Requests   Final    BOTTLES DRAWN AEROBIC AND ANAEROBIC Blood Culture results may not be optimal due to an excessive volume of blood received in culture bottles   Culture   Final    NO GROWTH 3 DAYS Performed at [facility], [place] [address] [place], [place] [address]    Report Status PENDING  Incomplete  Culture, blood (Routine X 2) w Reflex to ID Panel     Status: None (Preliminary result)   Collection Time: 04/24/19  3:03 PM   Specimen: BLOOD  Result Value Ref Range Status   Specimen Description BLOOD RIGHT ANTECUBITAL  Final   Special Requests   Final    BOTTLES DRAWN AEROBIC AND ANAEROBIC Blood Culture results may not be optimal due to an excessive volume of blood received in culture bottles   Culture   Final    NO GROWTH 3 DAYS Performed at [facility], [place] [address] [place], [place] [address]    Report Status PENDING  Incomplete     Time  coordinating discharge:  I have spent 35 minutes face to face with the patient and on the ward discussing the patients care, assessment, plan and disposition with other care givers. >50% of the time was devoted counseling the patient about the risks and benefits of treatment/Discharge disposition and coordinating care.   SIGNED:   Damita Lack, MD  [facility] 04/27/2019, 3:25 PM   If 7PM-7AM, please contact night-coverage [email or web address]

## 2019-04-27 NOTE — Progress Notes (Addendum)
Progress Note  Patient Name: Luis Lyons Date of Encounter: 04/27/2019  Primary Cardiologist: Glenetta Hew, MD  Subjective   Did not speak, was able to nod head to questions. No chest pain or shortness of breath.  Inpatient Medications    Scheduled Meds: . aspirin  81 mg Oral Daily  . atorvastatin  80 mg Oral q1800  . carvedilol  6.25 mg Oral BID WC  . coumadin book   Does not apply Once  . furosemide  40 mg Oral QODAY  . insulin aspart  0-9 Units Subcutaneous Q4H  . losartan  25 mg Oral Daily  . sodium chloride flush  10-40 mL Intracatheter Q12H  . sodium chloride flush  3 mL Intravenous Q12H  . spironolactone  25 mg Oral Daily  . Warfarin - Pharmacist Dosing Inpatient   Does not apply q1800   Continuous Infusions: . heparin 1,600 Units/hr (04/27/19 0009)  . vancomycin Stopped (04/26/19 2035)   PRN Meds: acetaminophen **OR** acetaminophen, levalbuterol, ondansetron (ZOFRAN) IV, polyethylene glycol, Resource ThickenUp Clear, senna-docusate, sodium chloride flush   Vital Signs    Vitals:   04/26/19 1920 04/26/19 2021 04/27/19 0526 04/27/19 0743  BP:  (!) 119/53 136/72 (!) 158/65  Pulse: 64 62 72 72  Resp: (!) 25   (!) 30  Temp:  98 F (36.7 C) 97.8 F (36.6 C) 99.1 F (37.3 C)  TempSrc:  Oral Oral Oral  SpO2: 92% 95% 96% 93%  Weight:   84.9 kg   Height:        Intake/Output Summary (Last 24 hours) at 04/27/2019 0851 Last data filed at 04/27/2019 0749 Gross per 24 hour  Intake 1077.3 ml  Output 2900 ml  Net -1822.7 ml   Last 3 Weights 04/27/2019 04/26/2019 04/25/2019  Weight (lbs) 187 lb 2.7 oz 189 lb 9.5 oz 182 lb 1.6 oz  Weight (kg) 84.9 kg 86 kg 82.6 kg      Telemetry    NSR without significant ventricular ectopy or recurrent afib - Personally Reviewed  ECG    Last EKG 7/20, NSR with Q wave and ST elevation in anterior leads - Personally Reviewed  Physical Exam   GEN: No acute distress. Chronically ill appearing. Did not speak  Neck: No JVD  Cardiac: RRR, no murmurs, rubs, or gallops.  Respiratory: Clear to auscultation bilaterally. GI: Soft, nontender, non-distended  MS: No edema; No deformity. Neuro:  Nonfocal  Psych: Normal affect   Labs    High Sensitivity Troponin:   Recent Labs  Lab 04/21/19 0609 04/21/19 0934 04/21/19 1159 04/21/19 1454  TROPONINIHS 3,566* 4,179* 4,027* 4,159*      Cardiac EnzymesNo results for input(s): TROPONINI in the last 168 hours. No results for input(s): TROPIPOC in the last 168 hours.   Chemistry Recent Labs  Lab 04/24/19 0349 04/25/19 0520 04/26/19 0500 04/27/19 0411  NA 139 138 138 136  K 4.0 3.2* 4.0 3.6  CL 105 105 103 100  CO2 25 25 27 27   GLUCOSE 115* 190* 106* 103*  BUN 23 15 12 14   CREATININE 1.07 1.05 1.08 1.03  CALCIUM 9.2 8.6* 8.8* 8.8*  PROT 6.7  --  6.4* 6.6  ALBUMIN 2.3* 2.1* 2.1* 2.2*  AST 19  --  20 21  ALT 21  --  20 24  ALKPHOS 62  --  57 60  BILITOT 0.4  --  0.6 0.9  GFRNONAA >60 >60 >60 >60  GFRAA >60 >60 >60 >60  ANIONGAP 9 8 8  9     Hematology Recent Labs  Lab 04/25/19 0520 04/26/19 0500 04/27/19 0411  WBC 9.7 11.5* 12.2*  RBC 3.85* 3.70* 3.84*  HGB 10.5* 9.9* 10.3*  HCT 33.0* 32.2* 33.1*  MCV 85.7 87.0 86.2  MCH 27.3 26.8 26.8  MCHC 31.8 30.7 31.1  RDW 14.0 14.2 14.5  PLT 312 319 344    BNPNo results for input(s): BNP, PROBNP in the last 168 hours.   DDimer No results for input(s): DDIMER in the last 168 hours.   Radiology    No results found.  Cardiac Studies    Echo 04/21/2019 IMPRESSIONS    1. The left ventricle has severely reduced systolic function, with an ejection fraction of 25-30%. The cavity size was normal. There is mildly increased left ventricular wall thickness. Left ventricular diastolic Doppler parameters are consistent with  pseudonormalization. Elevated mean left atrial pressure.  2. The right ventricle has normal systolic function. The cavity was normal.  3. The mitral valve is abnormal. There is  severe mitral annular calcification present.  4. The aortic root is normal in size and structure.  5. The aortic valve was not well visualized. No stenosis of the aortic valve.  6. Trivial pericardial effusion is present.  7. The tricuspid valve is grossly normal.  8. Akinesis of the anteroseptal wall and apex with overall severe LV dysfunction; moderate diastolic dysfunction; mild LVH; cannot R/O apical thrombus; possible density in pericardial space most prominent on apical 4 chamber views; suggest cardiac MRI  to further assess LV apex and pericardial space.    Echo 04/22/2019 IMPRESSIONS    1. Limited study with definity to evaluate LV function; technically difficult; apical akinesis; overall moderate LV dysfunction; doppler not performed.  2. The left ventricle has moderately reduced systolic function, with an ejection fraction of 35-40%.  Patient Profile     78 y.o. male with PMH of HTN, CVA s/p residual L sided weakness presented with fever and hypoxic respiratory failure in the setting of PNA. EKG demonstrated 2-3 mm ST elevation in anterolateral leads. EKG reviewed by Dr, Gwenlyn Found, given his comorbiditis and lack of symptom, recommended medical therapy  Assessment & Plan    1. STEMI:  - EF 25-35% with anteroseptal and apical akinesis. Followup limited echo with contrast showed EF 35-40%  - EKG showed anterolateral ST elevation which later resolved. Plan for medical management as she is not a good invasive candidate.   - switch ASA to plavix given ACS Ok to discharge from cardiac perspective. Followup in office in 2 weeks. Not a candidate for virtual visit  2. Chronic combined systolic and diastolic heart failure  - coreg, losartan and spironolactone.   - lasix 40mg  every other day.  3. HTN  4. PNA: increased aspiration risk. Speech therapist recommend dysphagia 2 diet. COVID 19 negative  5. AKI: improved  6. New onset afib: converted on amiodarone 7/21. afib likely  triggered by underlying PNA and recent MI  - CHA2DS2-Vasc score 7 (CHF, age > 35, CAD, HTN, CVA)  - started on coumadin, INR 2.5 today.       For questions or updates, please contact [place] Please consult [email or web address] for contact info under        Signed, Almyra Deforest, [place]  04/27/2019, 8:51 AM    --------------------------------------------------------------------------------------------- History and all data above reviewed.  Patient examined.  I agree with the findings as above.  Luis Lyons no acute distress, nods head, no CP or SOB.  Gen: NAD,  COR: RRR, no murmurs, Lungs: clear Abd: soft Extrem: no edema All available labs, radiology testing, previous records reviewed. Agree with documented assessment and plan of my colleague as stated above with the following additions or changes:  Principal Problem:   Sepsis due to pneumonia [facility]) Active Problems:   Acute respiratory failure with hypoxia ([place])   History of CVA (cerebrovascular accident)   Hyperglycemia   Hypokalemia   Renal insufficiency   Prolonged QT interval    Plan: Continue Plavix and warfarin, plan for close outpatient f/u.  Continue atorvastatin for CAD, carvedilol 6.25 mg BID, furosemide 40 mg QOD. Continue losartan 25 mg daily, spironolactone 25 mg daily, Cr stable.  Length of Stay:  LOS: 7 days   Luis Munroe, MD [facility] 9:42 AM  04/27/2019

## 2019-04-28 ENCOUNTER — Inpatient Hospital Stay (HOSPITAL_COMMUNITY): Payer: Medicare Other

## 2019-04-28 LAB — COMPREHENSIVE METABOLIC PANEL
ALT: 24 U/L (ref 0–44)
AST: 29 U/L (ref 15–41)
Albumin: 2.3 g/dL — ABNORMAL LOW (ref 3.5–5.0)
Alkaline Phosphatase: 66 U/L (ref 38–126)
Anion gap: 6 (ref 5–15)
BUN: 13 mg/dL (ref 8–23)
CO2: 29 mmol/L (ref 22–32)
Calcium: 8.8 mg/dL — ABNORMAL LOW (ref 8.9–10.3)
Chloride: 100 mmol/L (ref 98–111)
Creatinine, Ser: 0.96 mg/dL (ref 0.61–1.24)
GFR calc Af Amer: >60 mL/min (ref >60)
GFR calc non Af Amer: >60 mL/min (ref >60)
Glucose, Bld: 109 mg/dL — ABNORMAL HIGH (ref 70–99)
Potassium: 4.6 mmol/L (ref 3.5–5.1)
Sodium: 135 mmol/L (ref 135–145)
Total Bilirubin: 1.2 mg/dL (ref 0.3–1.2)
Total Protein: 6.8 g/dL (ref 6.5–8.1)

## 2019-04-28 LAB — NOVEL CORONAVIRUS, NAA (HOSP ORDER, SEND-OUT TO REF LAB; TAT 18-24 HRS): SARS-CoV-2, NAA: NOT DETECTED

## 2019-04-28 LAB — CBC
HCT: 33.7 % — ABNORMAL LOW (ref 39.0–52.0)
Hemoglobin: 10.7 g/dL — ABNORMAL LOW (ref 13.0–17.0)
MCH: 27.3 pg (ref 26.0–34.0)
MCHC: 31.8 g/dL (ref 30.0–36.0)
MCV: 86 fL (ref 80.0–100.0)
Platelets: 377 10*3/uL (ref 150–400)
RBC: 3.92 MIL/uL — ABNORMAL LOW (ref 4.22–5.81)
RDW: 14.7 % (ref 11.5–15.5)
WBC: 14.5 10*3/uL — ABNORMAL HIGH (ref 4.0–10.5)
nRBC: 0 % (ref 0.0–0.2)

## 2019-04-28 LAB — GLUCOSE, CAPILLARY
Glucose-Capillary: 100 mg/dL — ABNORMAL HIGH (ref 70–99)
Glucose-Capillary: 102 mg/dL — ABNORMAL HIGH (ref 70–99)
Glucose-Capillary: 104 mg/dL — ABNORMAL HIGH (ref 70–99)
Glucose-Capillary: 105 mg/dL — ABNORMAL HIGH (ref 70–99)
Glucose-Capillary: 106 mg/dL — ABNORMAL HIGH (ref 70–99)
Glucose-Capillary: 117 mg/dL — ABNORMAL HIGH (ref 70–99)

## 2019-04-28 LAB — BRAIN NATRIURETIC PEPTIDE: B Natriuretic Peptide: 401.3 pg/mL — ABNORMAL HIGH (ref 0.0–100.0)

## 2019-04-28 LAB — PROTIME-INR
INR: 2.6 — ABNORMAL HIGH (ref 0.8–1.2)
Prothrombin Time: 27.2 seconds — ABNORMAL HIGH (ref 11.4–15.2)

## 2019-04-28 LAB — MAGNESIUM: Magnesium: 2.2 mg/dL (ref 1.7–2.4)

## 2019-04-28 MED ORDER — BISACODYL 10 MG RE SUPP
10.0000 mg | Freq: Every day | RECTAL | Status: DC | PRN
  Administered 2019-04-28: 10 mg via RECTAL
  Filled 2019-04-28: qty 1

## 2019-04-28 MED ORDER — WARFARIN SODIUM 2.5 MG PO TABS
2.5000 mg | ORAL_TABLET | Freq: Once | ORAL | Status: AC
  Administered 2019-04-28: 2.5 mg via ORAL
  Filled 2019-04-28: qty 1

## 2019-04-28 NOTE — TOC Progression Note (Signed)
Transition of Care [facility]) - Progression Note    Patient Details  Name: Luis Lyons MRN: [id number] Date of Birth: [birth year]  Transition of Care [facility]) CM/SW [place], [place] Phone Number: [phone] 04/28/2019, 10:06 AM  Clinical Narrative:     CSW called [facility] yesterday and this morning to let them know that patient was ready for discharge. CSW left messages both times. CSW is awaiting a return call.  CSW updated RN about status of SNF placement.  CSW will continue to monitor for discharge.  Expected Discharge Plan: [place] Barriers to Discharge: Continued Medical Work up  Expected Discharge Plan and Services Expected Discharge Plan: [place] In-house Referral: NA Discharge Planning Services: NA Post Acute Care Choice: NA Living arrangements for the past 2 months: [place] Expected Discharge Date: 04/28/19                         [facility] Arranged: NA           Social Determinants of Health (SDOH) Interventions    Readmission Risk Interventions No flowsheet data found.

## 2019-04-28 NOTE — Progress Notes (Signed)
Patient discharged yesterday but pending COVID test.  COVID has resulted to be negative today.  INR is still therapeutic. Patient seen and examined at bedside, no complaints.  Feels okay. Vital signs stable  Proceed with discharge as planned yesterday.  Call with questions as needed.  Gerlean Ren MD

## 2019-04-28 NOTE — Progress Notes (Signed)
ANTICOAGULATION CONSULT NOTE - Follow-Up Consult  Pharmacy Consult for Warfarin Indication: chest pain/ACS/new Afib  Allergies  Allergen Reactions  . Bactrim [Sulfamethoxazole-Trimethoprim] Other (See Comments)    Listed on [facility]    Patient Measurements: Height: 6' 2.02" (188 cm) Weight: 181 lb 10.5 oz (82.4 kg) IBW/kg (Calculated) : 82.24  Heparin Dosing Wt: 90.7 kg  Vital Signs: Temp: 98.7 F (37.1 C) (07/26 0918) Temp Source: Oral (07/26 0918) BP: 132/58 (07/26 1154) Pulse Rate: 65 (07/26 1154)  Labs: Recent Labs    04/26/19 0500 04/27/19 0411 04/28/19 0450  HGB 9.9* 10.3* 10.7*  HCT 32.2* 33.1* 33.7*  PLT 319 344 377  LABPROT 20.2* 26.7* 27.2*  INR 1.8* 2.5* 2.6*  HEPARINUNFRC 0.49 0.61  --   CREATININE 1.08 1.03 0.96    Estimated Creatinine Clearance: 76.1 mL/min (by C-G formula based on SCr of 0.96 mg/dL).  Assessment: 78 yr old male presenting with SOB and fever. EKG changes showing STEMI. Patient was being medically managed as he is not a candidate for interventional therapy. New afib on 7/20, started amiodarone short-term but now discontinued. No anticoagulation PTA. Pharmacy consulted to dose warfarin.  INR (2.6) is therapeutic, trending up after five doses of warfarin. Lowered the dose last night to avoid significant increase in INR. CBC stable. No bleeding noted. Will continue this lower dose as his baby aspirin was changed to Plavix.   Discharge tomorrow, pending SNF bed availability. INR will be followed outpatient next week.  Goal of Therapy:  INR 2-3 Monitor platelets by anticoagulation protocol: Yes   Plan:  Warfarin 2.5 mg x1 tonight Daily PT/INR Monitor s/s bleeding  Richardine Service, PharmD PGY1 Pharmacy Resident Phone: [phone] 04/28/2019  3:26 PM  Please check [email or web address] for unit-specific pharmacy phone numbers.

## 2019-04-29 LAB — PROTIME-INR
INR: 2.7 — ABNORMAL HIGH (ref 0.8–1.2)
Prothrombin Time: 28.5 seconds — ABNORMAL HIGH (ref 11.4–15.2)

## 2019-04-29 LAB — CBC
HCT: 34.4 % — ABNORMAL LOW (ref 39.0–52.0)
Hemoglobin: 11 g/dL — ABNORMAL LOW (ref 13.0–17.0)
MCH: 27.2 pg (ref 26.0–34.0)
MCHC: 32 g/dL (ref 30.0–36.0)
MCV: 85.1 fL (ref 80.0–100.0)
Platelets: 383 10*3/uL (ref 150–400)
RBC: 4.04 MIL/uL — ABNORMAL LOW (ref 4.22–5.81)
RDW: 14.7 % (ref 11.5–15.5)
WBC: 14.3 10*3/uL — ABNORMAL HIGH (ref 4.0–10.5)
nRBC: 0 % (ref 0.0–0.2)

## 2019-04-29 LAB — CULTURE, BLOOD (ROUTINE X 2)
Culture: NO GROWTH
Culture: NO GROWTH

## 2019-04-29 LAB — GLUCOSE, CAPILLARY
Glucose-Capillary: 101 mg/dL — ABNORMAL HIGH (ref 70–99)
Glucose-Capillary: 89 mg/dL (ref 70–99)
Glucose-Capillary: 96 mg/dL (ref 70–99)
Glucose-Capillary: 98 mg/dL (ref 70–99)

## 2019-04-29 NOTE — TOC Transition Note (Signed)
Transition of Care [facility]) - CM/SW Discharge Note   Patient Details  Name: Luis Lyons MRN: [id number] Date of Birth: 1941/01/12  Transition of Care [facility]) CM/SW Contact:  Eileen Stanford, LCSW Phone Number: 04/29/2019, 11:42 AM   Clinical Narrative:   Clinical Social Worker facilitated patient discharge including contacting patient family and facility to confirm patient discharge plans.  Clinical information faxed to facility and family agreeable with plan.  CSW arranged ambulance transport via [facility] to [facility].  RN to call [phone] for report prior to discharge.   Final next level of care: Skilled Nursing Facility Barriers to Discharge: Continued Medical Work up   Patient Goals and CMS Choice Patient states their goals for this hospitalization and ongoing recovery are:: to get mom back to [place]   Choice offered to / list presented to : Adult Children  Discharge Placement              Patient chooses bed at: [facility] Patient to be transferred to facility by: [place] Name of family member notified: [name], daughter Patient and family notified of of transfer: 04/29/19  Discharge Plan and Services In-house Referral: NA Discharge Planning Services: NA Post Acute Care Choice: NA                    HH Arranged: NA          Social Determinants of Health (SDOH) Interventions     Readmission Risk Interventions No flowsheet data found.

## 2019-04-29 NOTE — Progress Notes (Signed)
Discharge summary done on 7/25.  Doing well, no complaints.  Currently awaiting placement at a skilled nursing facility.  INR today is 2.7.  No further adjustment.  Antibiotics discontinued.  Call with further questions as needed. Discharge paperwork done and updated. RN updated. Gerlean Ren MD [facility]

## 2019-04-30 ENCOUNTER — Telehealth: Payer: Self-pay | Admitting: Cardiology

## 2019-04-30 NOTE — Telephone Encounter (Signed)
Follow up   Luis Lyons from [facility] is returning call. Please call.

## 2019-04-30 NOTE — Telephone Encounter (Signed)
Received a call back from [place] at [facility] calling to schedule post hospital appointment.Appointment scheduled with Jory Sims DNP 05/13/19 at 10:45 am.

## 2019-04-30 NOTE — Telephone Encounter (Signed)
Returned call to [facility].

## 2019-04-30 NOTE — Telephone Encounter (Signed)
New Message           Juliann Pulse with [facility] is calling for a 10 day Hospital f/u, there is nothing available, pls call to advsie.

## 2019-05-13 ENCOUNTER — Ambulatory Visit: Payer: Medicare Other | Admitting: Adult Health

## 2019-05-13 ENCOUNTER — Telehealth: Payer: Self-pay | Admitting: Cardiology

## 2019-05-13 NOTE — Telephone Encounter (Signed)
Patient's daughter called stating that when her father was in the hospital they had discuss with him about palliative care, she wants to know who she can talk to about that.

## 2019-05-13 NOTE — Telephone Encounter (Signed)
Called patient daughter, LVM, advised that NP appointment they could discuss Palliative care. Left call back number if questions.

## 2019-05-15 NOTE — Progress Notes (Signed)
Error. No Show 

## 2019-05-16 ENCOUNTER — Encounter: Payer: Self-pay | Admitting: Adult Health

## 2019-05-16 ENCOUNTER — Encounter: Payer: Medicare Other | Admitting: Adult Health

## 2019-05-17 ENCOUNTER — Non-Acute Institutional Stay: Payer: Medicare Other | Admitting: Internal Medicine

## 2019-05-20 ENCOUNTER — Other Ambulatory Visit: Payer: Self-pay

## 2019-06-21 ENCOUNTER — Non-Acute Institutional Stay: Payer: Medicare Other | Admitting: Internal Medicine

## 2019-06-21 ENCOUNTER — Other Ambulatory Visit: Payer: Self-pay

## 2019-06-21 DIAGNOSIS — Z515 Encounter for palliative care: Secondary | ICD-10-CM

## 2019-06-24 NOTE — Progress Notes (Signed)
Luis Lyons Consult Note Telephone: [phone]  Fax: [phone]  PATIENT NAME: Luis Lyons DOB: [birth year] MRN: [id number]  PRIMARY CARE PROVIDER:   Dr. Gerald Dexter REFERRING PROVIDER:  No referring provider defined for this encounter.  RESPONSIBLE PARTY:   [place]. (son/POA) [phone] Emergency Contact:  [name] (daughter) [phone]  RECOMMENDATIONS and PLAN:  Palliative Care Encounter Z51.5  1.  Advance Care Planning:  Introduced Palliative care to son via phone and pt at previous visit. Goals of care reviewed which include residing at SNF long term due to multiple comorbidities and need for assistance with total care. Living will previously created by patient.  Reviewed MOST and DNAR forms with patient and son.  No clear decision at this time as to re-hospitalization or not.  Palliative care will re-address with son/POA and daughter.   2.  Dysphagia:  Chronic.  Aspiration precautions encouraged to patient and clinical staff. Follow recommendations of speech therapy for dietary modifications.   At risk of pneumonia.    3.  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  Complete physical therapy in facility for strengthening.  Near baseline.  Supportive care as needed.   I spent 40 minutes providing this consultation,  from 1130 to 1210. More than 50% of the time in this consultation was spent coordinating communication with patient, clinical staff and review of medical records.     HISTORY OF PRESENT ILLNESS:  Luis Lyons is a 78 y.o. year old male with multiple medical problems including CVA with L hemiparesis, hx of oral(mandibulectomy) and prostate cancer(prostatectomy) and numerous hospitalization related to recurrent pneumonia.  He was last hospitalized in July 2020 due to a aspiration pneumonia sepsis and an MI.  His EF is 25-30%  Staff reports that he does not normally follow recommendations of a dysphagia diet, is  wheelchair/bed bound, requires assistance with all ADLs, is incontinent of B&B but can feed self with some assistance.  Palliative Care was asked to help address goals of care.   CODE STATUS: DNAR  PPS: 30% HOSPICE ELIGIBILITY/DIAGNOSIS: TBD  PAST MEDICAL HISTORY:  Past Medical History:  Diagnosis Date  . Abdominal hernia   . Cancer ([place])   . Constipation   . Cough   . Depression   . Dry mouth   . Hard of hearing   . Hypertension   . Muscle weakness   . Stroke ([place])   . Urinary incontinence   . Vitamin D deficiency   . Vomiting   . Wheezing      PERTINENT MEDICATIONS:  Outpatient Encounter Medications as of 06/21/2019  Medication Sig  . acetaminophen (TYLENOL) 325 MG tablet Take 650 mg by mouth every 4 (four) hours as needed (temp 100.4 or greater).   Marland Kitchen albuterol (PROVENTIL) (2.5 MG/3ML) 0.083% nebulizer solution Take 2.5 mg by nebulization 2 (two) times daily as needed for wheezing or shortness of breath.  Marland Kitchen atorvastatin (LIPITOR) 80 MG tablet Take 1 tablet (80 mg total) by mouth daily at 6 PM.  . carvedilol (COREG) 6.25 MG tablet Take 1 tablet (6.25 mg total) by mouth 2 (two) times daily with a meal.  . chlorhexidine (PERIDEX) 0.12 % solution 15 mLs by Mouth Rinse route 2 (two) times a day.   . cholecalciferol (VITAMIN D3) 25 MCG (1000 UT) tablet Take 1,000 Units by mouth daily.  . clopidogrel (PLAVIX) 75 MG tablet Take 1 tablet (75 mg total) by mouth daily.  . coal tar (NEUTROGENA T-GEL)  0.5 % shampoo Apply 1 application topically 2 (two) times a week. Tuesday and Friday  . dextromethorphan (DELSYM) 30 MG/5ML liquid Take 60 mg by mouth 2 (two) times daily as needed for cough.  . docusate sodium (COLACE) 100 MG capsule Take 200 mg by mouth daily.  . fluticasone (FLONASE) 50 MCG/ACT nasal spray Place 1 spray into both nostrils daily.  . furosemide (LASIX) 40 MG tablet Take 1 tablet (40 mg total) by mouth every other day.  . ibuprofen (ADVIL,MOTRIN) 200 MG tablet Take 400 mg  by mouth every 6 (six) hours as needed for moderate pain.  Marland Kitchen ipratropium-albuterol (DUONEB) 0.5-2.5 (3) MG/3ML SOLN Take 3 mLs by nebulization 4 (four) times daily.   Marland Kitchen losartan (COZAAR) 25 MG tablet Take 1 tablet (25 mg total) by mouth daily.  . magnesium hydroxide (MILK OF MAGNESIA) 400 MG/5ML suspension Take 30 mLs by mouth daily as needed for mild constipation.  Marland Kitchen oxybutynin (DITROPAN-XL) 5 MG 24 hr tablet Take 5 mg by mouth at bedtime.  . phenazopyridine (PYRIDIUM) 200 MG tablet Take 200 mg by mouth 3 (three) times daily as needed for pain.  . polyethylene glycol (MIRALAX / GLYCOLAX) packet Take 17 g by mouth daily.  . polyvinyl alcohol (LIQUIFILM TEARS) 1.4 % ophthalmic solution Place 1 drop into both eyes 2 (two) times daily.  . Probiotic Product (RISA-BID PROBIOTIC PO) Take 2 tablets by mouth 2 (two) times a day.  . senna (SENOKOT) 8.6 MG tablet Take 2 tablets by mouth at bedtime.  . Simethicone 180 MG CAPS Take 180 mg by mouth 2 (two) times daily as needed (gas).  . sodium fluoride (DENTAGEL) 1.1 % GEL dental gel Place 1 application onto teeth 2 (two) times daily.  Marland Kitchen spironolactone (ALDACTONE) 25 MG tablet Take 1 tablet (25 mg total) by mouth daily.  . vitamin B-12 (CYANOCOBALAMIN) 1000 MCG tablet Take 1,000 mcg by mouth daily.  Marland Kitchen warfarin (COUMADIN) 2.5 MG tablet Take 1 tablet (2.5 mg total) by mouth daily at 6 PM.  . zinc oxide 20 % ointment Apply 1 application topically 2 (two) times daily as needed (scrotum for redness).   . zinc sulfate 220 (50 Zn) MG capsule Take 220 mg by mouth daily.   No facility-administered encounter medications on file as of 06/21/2019.     PHYSICAL EXAM:   General: NAD, frail appearing, thin elderly male in wheelchair Cardiovascular: regular rate and rhythm Pulmonary: rhonchi of bilat upper lobes.  No increased work of breathing Abdomen: soft, nontender, + bowel sounds Extremities: no edema Skin: exposed skin is intact Neurological: Weakness. Alert  and oriented to person.    Luis Lex, NP-C

## 2019-06-30 ENCOUNTER — Emergency Department (HOSPITAL_COMMUNITY): Payer: Medicare Other

## 2019-06-30 ENCOUNTER — Inpatient Hospital Stay (HOSPITAL_COMMUNITY): Payer: Medicare Other

## 2019-06-30 ENCOUNTER — Inpatient Hospital Stay (HOSPITAL_COMMUNITY)
Admission: EM | Admit: 2019-06-30 | Discharge: 2019-07-09 | DRG: 388 | Disposition: A | Discharge status: Skilled Nursing Facility | Payer: Medicare Other | Source: Skilled Nursing Facility | Attending: Internal Medicine | Admitting: Internal Medicine

## 2019-06-30 DIAGNOSIS — R918 Other nonspecific abnormal finding of lung field: Secondary | ICD-10-CM | POA: Diagnosis present

## 2019-06-30 DIAGNOSIS — Z66 Do not resuscitate: Secondary | ICD-10-CM | POA: Diagnosis present

## 2019-06-30 DIAGNOSIS — Z85819 Personal history of malignant neoplasm of unspecified site of lip, oral cavity, and pharynx: Secondary | ICD-10-CM | POA: Diagnosis not present

## 2019-06-30 DIAGNOSIS — Z8546 Personal history of malignant neoplasm of prostate: Secondary | ICD-10-CM

## 2019-06-30 DIAGNOSIS — K56609 Unspecified intestinal obstruction, unspecified as to partial versus complete obstruction: Secondary | ICD-10-CM | POA: Diagnosis present

## 2019-06-30 DIAGNOSIS — Z882 Allergy status to sulfonamides status: Secondary | ICD-10-CM

## 2019-06-30 DIAGNOSIS — C61 Malignant neoplasm of prostate: Secondary | ICD-10-CM | POA: Diagnosis present

## 2019-06-30 DIAGNOSIS — Z7901 Long term (current) use of anticoagulants: Secondary | ICD-10-CM

## 2019-06-30 DIAGNOSIS — Z923 Personal history of irradiation: Secondary | ICD-10-CM

## 2019-06-30 DIAGNOSIS — I69354 Hemiplegia and hemiparesis following cerebral infarction affecting left non-dominant side: Secondary | ICD-10-CM | POA: Diagnosis not present

## 2019-06-30 DIAGNOSIS — I482 Chronic atrial fibrillation, unspecified: Secondary | ICD-10-CM | POA: Diagnosis present

## 2019-06-30 DIAGNOSIS — R9431 Abnormal electrocardiogram [ECG] [EKG]: Secondary | ICD-10-CM | POA: Diagnosis present

## 2019-06-30 DIAGNOSIS — I4891 Unspecified atrial fibrillation: Secondary | ICD-10-CM | POA: Diagnosis present

## 2019-06-30 DIAGNOSIS — Z6824 Body mass index (BMI) 24.0-24.9, adult: Secondary | ICD-10-CM

## 2019-06-30 DIAGNOSIS — R8281 Pyuria: Secondary | ICD-10-CM | POA: Diagnosis not present

## 2019-06-30 DIAGNOSIS — E871 Hypo-osmolality and hyponatremia: Secondary | ICD-10-CM | POA: Diagnosis present

## 2019-06-30 DIAGNOSIS — Z20828 Contact with and (suspected) exposure to other viral communicable diseases: Secondary | ICD-10-CM | POA: Diagnosis present

## 2019-06-30 DIAGNOSIS — N179 Acute kidney failure, unspecified: Secondary | ICD-10-CM | POA: Diagnosis present

## 2019-06-30 DIAGNOSIS — Z9079 Acquired absence of other genital organ(s): Secondary | ICD-10-CM | POA: Diagnosis not present

## 2019-06-30 DIAGNOSIS — K920 Hematemesis: Secondary | ICD-10-CM

## 2019-06-30 DIAGNOSIS — E669 Obesity, unspecified: Secondary | ICD-10-CM | POA: Diagnosis present

## 2019-06-30 DIAGNOSIS — I252 Old myocardial infarction: Secondary | ICD-10-CM

## 2019-06-30 DIAGNOSIS — R0682 Tachypnea, not elsewhere classified: Secondary | ICD-10-CM

## 2019-06-30 DIAGNOSIS — I5022 Chronic systolic (congestive) heart failure: Secondary | ICD-10-CM | POA: Diagnosis present

## 2019-06-30 DIAGNOSIS — D62 Acute posthemorrhagic anemia: Secondary | ICD-10-CM | POA: Diagnosis present

## 2019-06-30 DIAGNOSIS — Z85818 Personal history of malignant neoplasm of other sites of lip, oral cavity, and pharynx: Secondary | ICD-10-CM | POA: Diagnosis not present

## 2019-06-30 DIAGNOSIS — K922 Gastrointestinal hemorrhage, unspecified: Secondary | ICD-10-CM | POA: Diagnosis not present

## 2019-06-30 DIAGNOSIS — K5651 Intestinal adhesions [bands], with partial obstruction: Secondary | ICD-10-CM | POA: Diagnosis present

## 2019-06-30 DIAGNOSIS — L89322 Pressure ulcer of left buttock, stage 2: Secondary | ICD-10-CM | POA: Diagnosis present

## 2019-06-30 DIAGNOSIS — Z8673 Personal history of transient ischemic attack (TIA), and cerebral infarction without residual deficits: Secondary | ICD-10-CM | POA: Diagnosis not present

## 2019-06-30 DIAGNOSIS — D72829 Elevated white blood cell count, unspecified: Secondary | ICD-10-CM | POA: Diagnosis not present

## 2019-06-30 DIAGNOSIS — E872 Acidosis: Secondary | ICD-10-CM | POA: Diagnosis present

## 2019-06-30 DIAGNOSIS — E278 Other specified disorders of adrenal gland: Secondary | ICD-10-CM

## 2019-06-30 DIAGNOSIS — I110 Hypertensive heart disease with heart failure: Secondary | ICD-10-CM | POA: Diagnosis present

## 2019-06-30 DIAGNOSIS — L8990 Pressure ulcer of unspecified site, unspecified stage: Secondary | ICD-10-CM | POA: Diagnosis present

## 2019-06-30 DIAGNOSIS — R1310 Dysphagia, unspecified: Secondary | ICD-10-CM | POA: Diagnosis not present

## 2019-06-30 DIAGNOSIS — Z87891 Personal history of nicotine dependence: Secondary | ICD-10-CM

## 2019-06-30 DIAGNOSIS — Z791 Long term (current) use of non-steroidal anti-inflammatories (NSAID): Secondary | ICD-10-CM

## 2019-06-30 DIAGNOSIS — E875 Hyperkalemia: Secondary | ICD-10-CM | POA: Diagnosis present

## 2019-06-30 DIAGNOSIS — Z7401 Bed confinement status: Secondary | ICD-10-CM | POA: Diagnosis not present

## 2019-06-30 DIAGNOSIS — R32 Unspecified urinary incontinence: Secondary | ICD-10-CM | POA: Diagnosis present

## 2019-06-30 DIAGNOSIS — H9190 Unspecified hearing loss, unspecified ear: Secondary | ICD-10-CM | POA: Diagnosis present

## 2019-06-30 DIAGNOSIS — Z7902 Long term (current) use of antithrombotics/antiplatelets: Secondary | ICD-10-CM

## 2019-06-30 DIAGNOSIS — K2971 Gastritis, unspecified, with bleeding: Secondary | ICD-10-CM | POA: Diagnosis present

## 2019-06-30 DIAGNOSIS — Z79899 Other long term (current) drug therapy: Secondary | ICD-10-CM

## 2019-06-30 DIAGNOSIS — I5020 Unspecified systolic (congestive) heart failure: Secondary | ICD-10-CM | POA: Diagnosis not present

## 2019-06-30 DIAGNOSIS — Z0189 Encounter for other specified special examinations: Secondary | ICD-10-CM

## 2019-06-30 DIAGNOSIS — R791 Abnormal coagulation profile: Secondary | ICD-10-CM | POA: Diagnosis present

## 2019-06-30 DIAGNOSIS — K429 Umbilical hernia without obstruction or gangrene: Secondary | ICD-10-CM | POA: Diagnosis present

## 2019-06-30 DIAGNOSIS — K565 Intestinal adhesions [bands], unspecified as to partial versus complete obstruction: Secondary | ICD-10-CM | POA: Diagnosis not present

## 2019-06-30 DIAGNOSIS — Z881 Allergy status to other antibiotic agents status: Secondary | ICD-10-CM | POA: Diagnosis not present

## 2019-06-30 LAB — CBC
HCT: 38.4 % — ABNORMAL LOW (ref 39.0–52.0)
Hemoglobin: 12.6 g/dL — ABNORMAL LOW (ref 13.0–17.0)
MCH: 28.8 pg (ref 26.0–34.0)
MCHC: 32.8 g/dL (ref 30.0–36.0)
MCV: 87.9 fL (ref 80.0–100.0)
Platelets: 631 10*3/uL — ABNORMAL HIGH (ref 150–400)
RBC: 4.37 MIL/uL (ref 4.22–5.81)
RDW: 16.1 % — ABNORMAL HIGH (ref 11.5–15.5)
WBC: 40.5 10*3/uL — ABNORMAL HIGH (ref 4.0–10.5)
nRBC: 0 % (ref 0.0–0.2)

## 2019-06-30 LAB — COMPREHENSIVE METABOLIC PANEL
ALT: 16 U/L (ref 0–44)
AST: 19 U/L (ref 15–41)
Albumin: 3.1 g/dL — ABNORMAL LOW (ref 3.5–5.0)
Alkaline Phosphatase: 83 U/L (ref 38–126)
Anion gap: 15 (ref 5–15)
BUN: 30 mg/dL — ABNORMAL HIGH (ref 8–23)
CO2: 21 mmol/L — ABNORMAL LOW (ref 22–32)
Calcium: 9.8 mg/dL (ref 8.9–10.3)
Chloride: 97 mmol/L — ABNORMAL LOW (ref 98–111)
Creatinine, Ser: 2.26 mg/dL — ABNORMAL HIGH (ref 0.61–1.24)
GFR calc Af Amer: 31 mL/min — ABNORMAL LOW (ref >60)
GFR calc non Af Amer: 27 mL/min — ABNORMAL LOW (ref >60)
Glucose, Bld: 183 mg/dL — ABNORMAL HIGH (ref 70–99)
Potassium: 5.2 mmol/L — ABNORMAL HIGH (ref 3.5–5.1)
Sodium: 133 mmol/L — ABNORMAL LOW (ref 135–145)
Total Bilirubin: 0.6 mg/dL (ref 0.3–1.2)
Total Protein: 9 g/dL — ABNORMAL HIGH (ref 6.5–8.1)

## 2019-06-30 LAB — TYPE AND SCREEN
ABO/RH(D): A POS
Antibody Screen: NEGATIVE

## 2019-06-30 LAB — LACTIC ACID, PLASMA
Lactic Acid, Venous: 2.2 mmol/L (ref 0.5–1.9)
Lactic Acid, Venous: 1.6 mmol/L (ref 0.5–1.9)

## 2019-06-30 LAB — APTT: aPTT: 48 seconds — ABNORMAL HIGH (ref 24–36)

## 2019-06-30 LAB — POC OCCULT BLOOD, ED: Fecal Occult Bld: NEGATIVE

## 2019-06-30 LAB — PROTIME-INR
INR: 3.4 — ABNORMAL HIGH (ref 0.8–1.2)
Prothrombin Time: 33.7 seconds — ABNORMAL HIGH (ref 11.4–15.2)

## 2019-06-30 LAB — ABO/RH: ABO/RH(D): A POS

## 2019-06-30 LAB — SARS CORONAVIRUS 2 BY RT PCR (HOSPITAL ORDER, PERFORMED IN ~~LOC~~ HOSPITAL LAB): SARS Coronavirus 2: NEGATIVE

## 2019-06-30 MED ORDER — VANCOMYCIN HCL IN DEXTROSE 1-5 GM/200ML-% IV SOLN
1000.0000 mg | Freq: Once | INTRAVENOUS | Status: DC
  Filled 2019-06-30: qty 200

## 2019-06-30 MED ORDER — LACTATED RINGERS IV BOLUS (SEPSIS)
1000.0000 mL | Freq: Once | INTRAVENOUS | Status: AC
  Administered 2019-06-30: 1000 mL via INTRAVENOUS

## 2019-06-30 MED ORDER — PANTOPRAZOLE SODIUM 40 MG IV SOLR
40.0000 mg | Freq: Two times a day (BID) | INTRAVENOUS | Status: DC
  Administered 2019-06-30 – 2019-07-04 (×10): 40 mg via INTRAVENOUS
  Filled 2019-06-30 (×11): qty 40

## 2019-06-30 MED ORDER — POLYVINYL ALCOHOL 1.4 % OP SOLN
1.0000 [drp] | Freq: Two times a day (BID) | OPHTHALMIC | Status: DC
  Administered 2019-06-30 – 2019-07-09 (×17): 1 [drp] via OPHTHALMIC
  Filled 2019-06-30: qty 15

## 2019-06-30 MED ORDER — ACETAMINOPHEN 325 MG PO TABS
650.0000 mg | ORAL_TABLET | Freq: Four times a day (QID) | ORAL | Status: DC | PRN
  Administered 2019-07-06: 650 mg via ORAL
  Filled 2019-06-30: qty 2

## 2019-06-30 MED ORDER — SODIUM CHLORIDE 0.9 % IV SOLN
INTRAVENOUS | Status: AC
  Administered 2019-06-30 – 2019-07-01 (×2): via INTRAVENOUS

## 2019-06-30 MED ORDER — VITAMIN K1 10 MG/ML IJ SOLN
5.0000 mg | Freq: Once | INTRAVENOUS | Status: AC
  Administered 2019-06-30: 5 mg via INTRAVENOUS
  Filled 2019-06-30: qty 0.5

## 2019-06-30 MED ORDER — SODIUM CHLORIDE 0.9 % IV SOLN
2.0000 g | Freq: Two times a day (BID) | INTRAVENOUS | Status: DC
  Administered 2019-06-30: 2 g via INTRAVENOUS
  Filled 2019-06-30: qty 2

## 2019-06-30 MED ORDER — LACTATED RINGERS IV BOLUS (SEPSIS)
500.0000 mL | Freq: Once | INTRAVENOUS | Status: AC
  Administered 2019-06-30: 500 mL via INTRAVENOUS

## 2019-06-30 MED ORDER — METRONIDAZOLE IN NACL 5-0.79 MG/ML-% IV SOLN
500.0000 mg | Freq: Once | INTRAVENOUS | Status: AC
  Administered 2019-06-30: 500 mg via INTRAVENOUS
  Filled 2019-06-30: qty 100

## 2019-06-30 MED ORDER — SODIUM CHLORIDE 0.9 % IV SOLN
2.0000 g | Freq: Two times a day (BID) | INTRAVENOUS | Status: DC
  Administered 2019-07-01 – 2019-07-03 (×4): 2 g via INTRAVENOUS
  Filled 2019-06-30 (×5): qty 2

## 2019-06-30 MED ORDER — VANCOMYCIN HCL IN DEXTROSE 750-5 MG/150ML-% IV SOLN
750.0000 mg | INTRAVENOUS | Status: DC
  Administered 2019-06-30 – 2019-07-03 (×3): 750 mg via INTRAVENOUS
  Filled 2019-06-30 (×4): qty 150

## 2019-06-30 MED ORDER — VANCOMYCIN HCL 10 G IV SOLR
1500.0000 mg | Freq: Once | INTRAVENOUS | Status: AC
  Administered 2019-06-30: 05:00:00 1500 mg via INTRAVENOUS
  Filled 2019-06-30: qty 1500

## 2019-06-30 MED ORDER — ACETAMINOPHEN 650 MG RE SUPP: 650.0000 mg | Freq: Four times a day (QID) | RECTAL | Status: DC | PRN

## 2019-06-30 MED ORDER — DIATRIZOATE MEGLUMINE & SODIUM 66-10 % PO SOLN
90.0000 mL | Freq: Once | ORAL | Status: AC
  Administered 2019-06-30: 10:00:00 90 mL via NASOGASTRIC
  Filled 2019-06-30: qty 90

## 2019-06-30 MED ORDER — SODIUM CHLORIDE 0.9 % IV SOLN
2.0000 g | Freq: Once | INTRAVENOUS | Status: AC
  Administered 2019-06-30: 2 g via INTRAVENOUS
  Filled 2019-06-30: qty 2

## 2019-06-30 NOTE — Consult Note (Addendum)
[facility] Gastroenterology Consult  Referring Provider: Internal medicine teaching service Primary Care Physician:  System, Pcp Not In Primary Gastroenterologist: Unassigned  Reason for Consultation: Coffee-ground return on NG tube  HPI: Luis Lyons is a 78 y.o. male was brought to the ED from nursing facility with complains of abdominal pain, nausea and vomiting and had an NG tube placed which showed coffee-ground return. Patient has history of mouth cancer, surgery and radiation in 2014 and is difficult to understand.  Most of the history is obtained from documentation.  He has history of stroke with left-sided weakness, was on Plavix, has history of prostate cancer with prostatectomy in the past, CHF with an EF of 25 to 30%, STEMI in 04/2019, medically managed without left heart catheterization, atrial fibrillation on Coumadin and is DNR.  He had small bowel obstruction and had an exploratory laparotomy with lysis of adhesions in 09/2018. In the ED he was found to have elevated leukocytosis of 40.5, INR of 3.4, thrombocytosis 631, renal impairment, mild hyponatremia and hyperkalemia with mild acidosis and lactic acid of 2.2. CT scan performed for coffee-ground emesis and possible history of black stool showed a 6.1 x 4.4 cm soft tissue mass in infrahilar medial right lower lobe. Stomach appeared unremarkable, no gastric wall thickening was noted, no evidence of outlet obstruction, with normal duodenum however proximal small bowel loops were dilated up to 4.7 cm with a transition zone identified just distal to ligament of Treitz, more dilated and fluid-filled small bowel loops distally with a second transition zone in the central pelvis.  No colonic wall thickening, normal-appearing terminal ileum. 0.3 cm right adrenal no dual concerning for metastases. Surgical consultation was obtained and NG tube placement with small bowel obstruction protocol recommended.  Patient deemed to be a poor surgical  candidate.   Past Medical History:  Diagnosis Date  . Abdominal hernia   . Cancer ([place])   . Constipation   . Cough   . Depression   . Dry mouth   . Hard of hearing   . Hypertension   . Muscle weakness   . Stroke ([place])   . Urinary incontinence   . Vitamin D deficiency   . Vomiting   . Wheezing     Past Surgical History:  Procedure Laterality Date  . ABDOMINAL SURGERY    . LAPAROTOMY N/A 09/05/2018   Procedure: EXPLORATORY LAPAROTOMY;  Surgeon: Georganna Skeans, MD;  Location: [facility];  Service: General;  Laterality: N/A;    Prior to Admission medications   Medication Sig Start Date End Date Taking? Authorizing Provider  acetaminophen (TYLENOL) 325 MG tablet Take 650 mg by mouth 3 (three) times daily.    Yes [provider]  albuterol (PROVENTIL) (2.5 MG/3ML) 0.083% nebulizer solution Take 2.5 mg by nebulization 2 (two) times daily as needed for wheezing or shortness of breath.   Yes [provider]  atorvastatin (LIPITOR) 80 MG tablet Take 1 tablet (80 mg total) by mouth daily at 6 PM. 04/27/19  Yes Amin, Ankit Chirag, MD  carvedilol (COREG) 6.25 MG tablet Take 1 tablet (6.25 mg total) by mouth 2 (two) times daily with a meal. 04/27/19  Yes Amin, Ankit Chirag, MD  chlorhexidine (PERIDEX) 0.12 % solution 15 mLs by Mouth Rinse route 2 (two) times a day.    Yes [provider]  clopidogrel (PLAVIX) 75 MG tablet Take 1 tablet (75 mg total) by mouth daily. 04/28/19  Yes Amin, Ankit Chirag, MD  coal tar (NEUTROGENA T-GEL) 0.5 %  shampoo Apply 1 application topically 2 (two) times a week. Tuesday and Friday   Yes [provider]  dextromethorphan (DELSYM) 30 MG/5ML liquid Take 60 mg by mouth 2 (two) times daily as needed for cough.   Yes [provider]  diphenhydrAMINE (BENADRYL) 25 mg capsule Take 25 mg by mouth every 8 (eight) hours as needed for itching.   Yes [provider]  docusate sodium (COLACE) 100 MG capsule Take 200 mg by mouth  daily.   Yes [provider]  fluticasone (FLONASE) 50 MCG/ACT nasal spray Place 1 spray into both nostrils daily.   Yes [provider]  furosemide (LASIX) 40 MG tablet Take 1 tablet (40 mg total) by mouth every other day. 04/28/19  Yes Amin, Ankit Chirag, MD  guaifenesin (HUMIBID E) 400 MG TABS tablet Take 400 mg by mouth every 6 (six) hours as needed (cough/congestion).   Yes [provider]  guaifenesin (HUMIBID E) 400 MG TABS tablet Take 400 mg by mouth every 4 (four) hours.   Yes [provider]  ibuprofen (ADVIL,MOTRIN) 200 MG tablet Take 400 mg by mouth every 6 (six) hours as needed for moderate pain.   Yes [provider]  ipratropium-albuterol (DUONEB) 0.5-2.5 (3) MG/3ML SOLN Take 3 mLs by nebulization 4 (four) times daily.    Yes [provider]  ketoconazole (NIZORAL) 2 % cream Apply 1 application topically 2 (two) times daily. Apply to face twice daily for 4 weeks 06/04/19 07/02/19 Yes [provider]  losartan (COZAAR) 25 MG tablet Take 1 tablet (25 mg total) by mouth daily. 04/28/19  Yes Amin, Jeanella Flattery, MD  magnesium hydroxide (MILK OF MAGNESIA) 400 MG/5ML suspension Take 30 mLs by mouth daily as needed for mild constipation.   Yes [provider]  nystatin (NYSTATIN) powder Apply 1 Bottle topically every 6 (six) hours as needed (diaper changes).   Yes [provider]  ondansetron (ZOFRAN) 4 MG tablet Take 4 mg by mouth every 6 (six) hours as needed for nausea.   Yes [provider]  oxybutynin (DITROPAN-XL) 5 MG 24 hr tablet Take 5 mg by mouth at bedtime.   Yes [provider]  phenazopyridine (PYRIDIUM) 200 MG tablet Take 200 mg by mouth 3 (three) times daily as needed for pain.   Yes [provider]  polyethylene glycol (MIRALAX / GLYCOLAX) packet Take 17 g by mouth daily.   Yes [provider]  polyvinyl alcohol (LIQUIFILM TEARS) 1.4 % ophthalmic solution Place 1 drop  into both eyes 2 (two) times daily.   Yes [provider]  Probiotic Product (RISA-BID PROBIOTIC PO) Take 2 tablets by mouth 2 (two) times a day.   Yes [provider]  senna (SENOKOT) 8.6 MG tablet Take 2 tablets by mouth at bedtime.   Yes [provider]  Simethicone 180 MG CAPS Take 180 mg by mouth 2 (two) times daily as needed (gas).   Yes [provider]  sodium fluoride (DENTAGEL) 1.1 % GEL dental gel Place 1 application onto teeth 2 (two) times daily.   Yes [provider]  spironolactone (ALDACTONE) 25 MG tablet Take 1 tablet (25 mg total) by mouth daily. 04/28/19  Yes Amin, Jeanella Flattery, MD  vitamin B-12 (CYANOCOBALAMIN) 1000 MCG tablet Take 1,000 mcg by mouth daily.   Yes [provider]  warfarin (COUMADIN) 4 MG tablet Take 4 mg by mouth every evening.   Yes [provider]  warfarin (COUMADIN) 2.5 MG tablet Take  1 tablet (2.5 mg total) by mouth daily at 6 PM. Patient not taking: Reported on 06/30/2019 04/27/19   Damita Lack, MD    Current Facility-Administered Medications  Medication Dose Route Frequency Provider Last Rate Last Dose  . 0.9 %  sodium chloride infusion   Intravenous Continuous Asencion Noble, MD 75 mL/hr at 06/30/19 0930    . acetaminophen (TYLENOL) tablet 650 mg  650 mg Oral Q6H PRN Asencion Noble, MD       Or  . acetaminophen (TYLENOL) suppository 650 mg  650 mg Rectal Q6H PRN Asencion Noble, MD      . ceFEPIme (MAXIPIME) 2 g in sodium chloride 0.9 % 100 mL IVPB  2 g Intravenous Q12H Lonia Skinner M, MD      . polyvinyl alcohol (LIQUIFILM TEARS) 1.4 % ophthalmic solution 1 drop  1 drop Both Eyes BID Sherry Ruffing, Marissa M, MD      . vancomycin (VANCOCIN) IVPB 750 mg/150 ml premix  750 mg Intravenous Q24H Asencion Noble, MD       Current Outpatient Medications  Medication Sig Dispense Refill  . acetaminophen (TYLENOL) 325 MG tablet Take 650 mg by mouth 3 (three) times daily.     Marland Kitchen  albuterol (PROVENTIL) (2.5 MG/3ML) 0.083% nebulizer solution Take 2.5 mg by nebulization 2 (two) times daily as needed for wheezing or shortness of breath.    Marland Kitchen atorvastatin (LIPITOR) 80 MG tablet Take 1 tablet (80 mg total) by mouth daily at 6 PM.    . carvedilol (COREG) 6.25 MG tablet Take 1 tablet (6.25 mg total) by mouth 2 (two) times daily with a meal.    . chlorhexidine (PERIDEX) 0.12 % solution 15 mLs by Mouth Rinse route 2 (two) times a day.     . clopidogrel (PLAVIX) 75 MG tablet Take 1 tablet (75 mg total) by mouth daily.    . coal tar (NEUTROGENA T-GEL) 0.5 % shampoo Apply 1 application topically 2 (two) times a week. Tuesday and Friday    . dextromethorphan (DELSYM) 30 MG/5ML liquid Take 60 mg by mouth 2 (two) times daily as needed for cough.    . diphenhydrAMINE (BENADRYL) 25 mg capsule Take 25 mg by mouth every 8 (eight) hours as needed for itching.    . docusate sodium (COLACE) 100 MG capsule Take 200 mg by mouth daily.    . fluticasone (FLONASE) 50 MCG/ACT nasal spray Place 1 spray into both nostrils daily.    . furosemide (LASIX) 40 MG tablet Take 1 tablet (40 mg total) by mouth every other day. 30 tablet   . guaifenesin (HUMIBID E) 400 MG TABS tablet Take 400 mg by mouth every 6 (six) hours as needed (cough/congestion).    Marland Kitchen guaifenesin (HUMIBID E) 400 MG TABS tablet Take 400 mg by mouth every 4 (four) hours.    Marland Kitchen ibuprofen (ADVIL,MOTRIN) 200 MG tablet Take 400 mg by mouth every 6 (six) hours as needed for moderate pain.    Marland Kitchen ipratropium-albuterol (DUONEB) 0.5-2.5 (3) MG/3ML SOLN Take 3 mLs by nebulization 4 (four) times daily.     Marland Kitchen ketoconazole (NIZORAL) 2 % cream Apply 1 application topically 2 (two) times daily. Apply to face twice daily for 4 weeks    . losartan (COZAAR) 25 MG tablet Take 1 tablet (25 mg total) by mouth daily.    . magnesium hydroxide (MILK OF MAGNESIA) 400 MG/5ML suspension Take 30 mLs by mouth daily as needed for mild constipation.    Marland Kitchen  nystatin (NYSTATIN)  powder Apply 1 Bottle topically every 6 (six) hours as needed (diaper changes).    . ondansetron (ZOFRAN) 4 MG tablet Take 4 mg by mouth every 6 (six) hours as needed for nausea.    Marland Kitchen oxybutynin (DITROPAN-XL) 5 MG 24 hr tablet Take 5 mg by mouth at bedtime.    . phenazopyridine (PYRIDIUM) 200 MG tablet Take 200 mg by mouth 3 (three) times daily as needed for pain.    . polyethylene glycol (MIRALAX / GLYCOLAX) packet Take 17 g by mouth daily.    . polyvinyl alcohol (LIQUIFILM TEARS) 1.4 % ophthalmic solution Place 1 drop into both eyes 2 (two) times daily.    . Probiotic Product (RISA-BID PROBIOTIC PO) Take 2 tablets by mouth 2 (two) times a day.    . senna (SENOKOT) 8.6 MG tablet Take 2 tablets by mouth at bedtime.    . Simethicone 180 MG CAPS Take 180 mg by mouth 2 (two) times daily as needed (gas).    . sodium fluoride (DENTAGEL) 1.1 % GEL dental gel Place 1 application onto teeth 2 (two) times daily.    Marland Kitchen spironolactone (ALDACTONE) 25 MG tablet Take 1 tablet (25 mg total) by mouth daily.    . vitamin B-12 (CYANOCOBALAMIN) 1000 MCG tablet Take 1,000 mcg by mouth daily.    Marland Kitchen warfarin (COUMADIN) 4 MG tablet Take 4 mg by mouth every evening.    . warfarin (COUMADIN) 2.5 MG tablet Take 1 tablet (2.5 mg total) by mouth daily at 6 PM. (Patient not taking: Reported on 06/30/2019)      Allergies as of 06/30/2019 - Review Complete 06/30/2019  Allergen Reaction Noted  . Bactrim [sulfamethoxazole-trimethoprim] Other (See Comments) 04/21/2019    Family History  Family history unknown: Yes    Social History   Socioeconomic History  . Marital status: Widowed    Spouse name: Not on file  . Number of children: Not on file  . Years of education: Not on file  . Highest education level: Not on file  Occupational History  . Not on file  Social Needs  . Financial resource strain: Not on file  . Food insecurity    Worry: Not on file    Inability: Not on file  . Transportation needs    Medical:  Not on file    Non-medical: Not on file  Tobacco Use  . Smoking status: Former Research scientist (life sciences)  . Smokeless tobacco: Never Used  Substance and Sexual Activity  . Alcohol use: Not Currently  . Drug use: Never  . Sexual activity: Not on file  Lifestyle  . Physical activity    Days per week: Not on file    Minutes per session: Not on file  . Stress: Not on file  Relationships  . Social Herbalist on phone: Not on file    Gets together: Not on file    Attends religious service: Not on file    Active member of club or organization: Not on file    Attends meetings of clubs or organizations: Not on file    Relationship status: Not on file  . Intimate partner violence    Fear of current or ex partner: Not on file    Emotionally abused: Not on file    Physically abused: Not on file    Forced sexual activity: Not on file  Other Topics Concern  . Not on file  Social History Narrative  . Not on file  Review of Systems: Unable to obtain  Physical Exam: Vital signs in last 24 hours: Temp:  [97.5 F (36.4 C)-99.4 F (37.4 C)] 99.4 F (37.4 C) (09/27 0420) Pulse Rate:  [53-137] 82 (09/27 1300) Resp:  [19-37] 23 (09/27 1300) BP: (93-146)/(40-101) 112/48 (09/27 1300) SpO2:  [91 %-100 %] 98 % (09/27 1300) Weight:  [83.9 kg] 83.9 kg (09/27 0402)    General:   Frail, thinly built, chronically ill-appearing Head: No dentures noted in lower jaw.  NG tube draining coffee-ground material about 500 cc in the canister Eyes:  Sclera clear, no icterus.   Conjunctiva pink. Ears: Hard of hearing Nose:  No deformity, discharge,  or lesions. Mouth: Very difficult to understand  neck: Prior tracheostomy scar noted ,supple; no masses or thyromegaly. Lungs:  Clear throughout to auscultation.   No wheezes, crackles, or rhonchi. No acute distress. Heart:  Regular rate and rhythm; no murmurs, clicks, rubs,  or gallops. Extremities:  Without clubbing or edema. Neurologic: Awake, follows  commands, able to move all 4 extremities Skin:  Intact without significant lesions or rashes. Psych:  Alert and cooperative. Normal mood and affect. Abdomen: Midline healed surgical scar with a palpable hernial defect mild distention, bowel sounds audible, nontender, no guarding or rigidity        Lab Results: Recent Labs    06/30/19 0223  WBC 40.5*  HGB 12.6*  HCT 38.4*  PLT 631*   BMET Recent Labs    06/30/19 0223  NA 133*  K 5.2*  CL 97*  CO2 21*  GLUCOSE 183*  BUN 30*  CREATININE 2.26*  CALCIUM 9.8   LFT Recent Labs    06/30/19 0223  PROT 9.0*  ALBUMIN 3.1*  AST 19  ALT 16  ALKPHOS 83  BILITOT 0.6   PT/INR Recent Labs    06/30/19 0223  LABPROT 33.7*  INR 3.4*    Studies/Results: Ct Abdomen Pelvis Wo Contrast  Result Date: 06/30/2019 CLINICAL DATA:  Coffee ground emesis.  Black stools. EXAM: CT ABDOMEN AND PELVIS WITHOUT CONTRAST TECHNIQUE: Multidetector CT imaging of the abdomen and pelvis was performed following the standard protocol without IV contrast. COMPARISON:  08/28/2018 FINDINGS: Lower chest: 6.1 x 4.4 cm soft tissue mass is identified in the infrahilar medial right lower lobe. There is some minimal dependent airspace opacity in the lower lobes bilaterally suggesting pneumonia. Imaging of the lung bases is motion degraded, but underlying emphysema suspected. Hepatobiliary: No focal abnormality in the liver on this study without intravenous contrast. Calcified gallstones evident. No intrahepatic or extrahepatic biliary dilation. Pancreas: No focal mass lesion. No dilatation of the main duct. No intraparenchymal cyst. No peripancreatic edema. Spleen: No splenomegaly. No focal mass lesion. Adrenals/Urinary Tract: Left adrenal gland unremarkable. 2.3 cm right adrenal nodule is new in the interval. Marked atrophy noted upper pole right kidney, similar to prior with nonobstructive stone disease evident. 9 mm nonobstructing stone identified in the lower pole  left kidney with nonobstructing smaller stone noted in the upper pole left kidney. No evidence for hydroureter. Bladder is nondistended which may account for the circumferential bladder wall thickening. Stomach/Bowel: Stomach is unremarkable. No gastric wall thickening. No evidence of outlet obstruction. Duodenum is normally positioned as is the ligament of Treitz. Proximal small bowel loops are dilated up to 4.7 cm diameter. A transition zone is identified just distal to the ligament of Treitz (axial image 38/3 and coronal image 46/6). Proximal jejunal loops represent the dilated small bowel. These loops remain dilated and become  fluid-filled more distally where a second transition zone is identified in the central pelvis (axial 63/3 and coronal 49/6). Small bowel loops distal to this second transition zone are largely decompressed. Terminal ileum unremarkable. The appendix is not visualized, but there is no edema or inflammation in the region of the cecum. No gross colonic mass. No colonic wall thickening. Vascular/Lymphatic: There is abdominal aortic atherosclerosis without aneurysm. There is no gastrohepatic or hepatoduodenal ligament lymphadenopathy. No intraperitoneal or retroperitoneal lymphadenopathy. No pelvic sidewall lymphadenopathy. Reproductive: Prostate gland not clearly visualized suggesting prostatectomy. Other: No substantial intraperitoneal free fluid. Musculoskeletal: Status post ORIF for left femoral neck fracture. Bones are diffusely demineralized. IMPRESSION: 1. Small bowel obstruction with jejunal loops dilated up to 4.7 cm diameter. 2 separate transition zones are identified, 1 in the proximal jejunum just distal to the ligament of Treitz. The second is in the mid to distal small bowel and located in the central pelvis. Given the presence of 2 transition zones, closed loop obstruction is a distinct concern. No substantial bowel wall thickening or evidence of pneumatosis. No substantial  mesenteric edema, interloop mesenteric fluid, or free fluid in the peritoneal cavity. 2. 6.1 x 4.4 cm soft tissue mass in the medial right lower lobe highly concerning for neoplasm. 3. 2.3 cm right adrenal nodule.  Metastatic disease a concern. 4.  Aortic Atherosclerois (ICD10-170.0) Electronically Signed   By: Misty Stanley M.D.   On: 06/30/2019 06:33   Dg Chest Port 1 View  Result Date: 06/30/2019 CLINICAL DATA:  Initial evaluation for acute shortness of breath. EXAM: PORTABLE CHEST 1 VIEW COMPARISON:  Prior radiograph from 04/28/2019. FINDINGS: Cardiac and mediastinal silhouettes are stable in size and contour, and remain within normal limits. Lungs mildly hypoinflated. Mild chronic bronchitic changes. No consolidative airspace disease. No edema or effusion. No pneumothorax. No acute osseous finding. IMPRESSION: 1. Mild chronic bronchitic changes, most notable at the lung bases. 2. No other superimposed active cardiopulmonary disease. Electronically Signed   By: Jeannine Boga M.D.   On: 06/30/2019 04:36   Dg Abd Portable 1v  Result Date: 06/30/2019 CLINICAL DATA:  NG tube placement. Verification of placement of NG Tube EXAM: PORTABLE ABDOMEN - 1 VIEW COMPARISON:  None. FINDINGS: NG tube in stomach. Side port GE junction. Dilated loops of small bowel. IMPRESSION: 1. NG tube in stomach.  Side port GE junction 2. Dilated loops small bowel. Electronically Signed   By: Suzy Bouchard M.D.   On: 06/30/2019 09:12    Impression: Coffee-ground return in NG tube-Hb 12.6(likely hemoconcentrated, baseline hemoglobin 11)  Small bowel obstruction, history of prior exploratory laparotomy with lysis of adhesions. CVA with left-sided weakness on Plavix History of mouth cancer, status post radiation and surgery in 2014 History of prostate cancer, status post prostatectomy Recent STEMI in 04/2019, medically treated A. fib on Coumadin, elevated INR Right lung mass suspicious for neoplasm- adrenal nodule  could be metastatic   Plan: Coffee-ground return from NG tube could be related to erosive gastritis, or Mallory-Weiss tear. As patient has small bowel obstruction- endoscopy is contraindicated at this time. Recommend placing patient on PPI twice daily and monitoring H&H.  Plavix and Coumadin to be on hold in case patient requires surgical management.  On IV cefepime, received 1 dose of IV vancomycin and IV Flagyl, has received vitamin K 5 mg 1 dose and 2.5 L of lactated Ringer's.  Endoscopic intervention not planned, recommend conservative management with PPI and transfusion if needed.  At one point after resolution of small  bowel obstruction, endoscopy may be needed on an elective basis.   LOS: 0 days   Ronnette Juniper, MD  06/30/2019, 1:35 PM

## 2019-06-30 NOTE — Progress Notes (Signed)
Pharmacy Antibiotic Note  Luis Lyons is a 78 y.o. male admitted on 06/30/2019 with emesis.  Pharmacy has been consulted for Vancomycin/Cefepime dosing for r/o sepsis, unknown source. WBC is markedly elevated. Noted renal dysfunction.   Plan: Vancomycin 1500 mg IV x 1, then 750 mg IV q24h >>Estimated AUC: 412 Cefepime 2g IV q12h Trend WBC, temp, renal function  F/U infectious work-up Drug levels as indicated   Height: 6\' 1"  (185.4 cm) Weight: 185 lb (83.9 kg) IBW/kg (Calculated) : 79.9  Temp (24hrs), Avg:98.5 F (36.9 C), Min:97.5 F (36.4 C), Max:99.4 F (37.4 C)  Recent Labs  Lab 06/30/19 0223 06/30/19 0227  WBC 40.5*  --   CREATININE 2.26*  --   LATICACIDVEN  --  2.2*    Estimated Creatinine Clearance: 30.9 mL/min (A) (by C-G formula based on SCr of 2.26 mg/dL (H)).    Allergies  Allergen Reactions  . Bactrim [Sulfamethoxazole-Trimethoprim] Other (See Comments)    Listed on [facility]    Narda Bonds, PharmD, [place] Pharmacist Phone: [phone]

## 2019-06-30 NOTE — ED Provider Notes (Signed)
[facility] EMERGENCY DEPARTMENT Provider Note   CSN: [id number] Arrival date & time: 06/30/19  0216     History   Chief Complaint Chief Complaint  Patient presents with   Emesis    HPI Luis Lyons is a 78 y.o. male with past medical history significant for CVA, cancer, hypertension, and hard-of-hearing who presents to the ED from his assisted care facility via EMS for episode of coffee-ground emesis and "possible "black tarry stools.  Patient states that he consumed chocolate milkshake immediately prior to emesis which is why it appeared dark in character.  He states that he believes he has a UTI and he also admits to a headache that started yesterday.  He continues to feel nauseated and has had multiple episodes of emesis.  He also has some abdominal tenderness, but states that is a chronic issue for him.  Evidently there recently was a COVID positive resident of his care facility and he admits to worsening shortness of breath symptoms.  He denies any chest pain, cough, dizziness, lightheadedness, focal neurologic deficits, new or worsening abdominal pain, or changes in bowel habits.     HPI  Past Medical History:  Diagnosis Date   Abdominal hernia    Cancer (HCC)    Constipation    Cough    Depression    Dry mouth    Hard of hearing    Hypertension    Muscle weakness    Stroke [facility])    Urinary incontinence    Vitamin D deficiency    Vomiting    Wheezing     Patient Active Problem List   Diagnosis Date Noted   Sepsis due to pneumonia ([place]) 04/20/2019   Acute respiratory failure with hypoxia ([place]) 04/20/2019   History of CVA (cerebrovascular accident) 04/20/2019   Hyperglycemia 04/20/2019   Hypokalemia 04/20/2019   Renal insufficiency 04/20/2019   Prolonged QT interval 04/20/2019   HCAP (healthcare-associated pneumonia)    CAP (community acquired pneumonia) 08/29/2018   Essential hypertension 08/29/2018   Acute  bronchitis 08/29/2018   SBO (small bowel obstruction) ([place]) 08/28/2018    Past Surgical History:  Procedure Laterality Date   ABDOMINAL SURGERY     LAPAROTOMY N/A 09/05/2018   Procedure: EXPLORATORY LAPAROTOMY;  Surgeon: Georganna Skeans, MD;  Location: [facility];  Service: General;  Laterality: N/A;        Home Medications    Prior to Admission medications   Medication Sig Start Date End Date Taking? Authorizing Provider  acetaminophen (TYLENOL) 325 MG tablet Take 650 mg by mouth every 4 (four) hours as needed (temp 100.4 or greater).     [provider]  albuterol (PROVENTIL) (2.5 MG/3ML) 0.083% nebulizer solution Take 2.5 mg by nebulization 2 (two) times daily as needed for wheezing or shortness of breath.    [provider]  atorvastatin (LIPITOR) 80 MG tablet Take 1 tablet (80 mg total) by mouth daily at 6 PM. 04/27/19   Amin, Jeanella Flattery, MD  carvedilol (COREG) 6.25 MG tablet Take 1 tablet (6.25 mg total) by mouth 2 (two) times daily with a meal. 04/27/19   Amin, Jeanella Flattery, MD  chlorhexidine (PERIDEX) 0.12 % solution 15 mLs by Mouth Rinse route 2 (two) times a day.     [provider]  cholecalciferol (VITAMIN D3) 25 MCG (1000 UT) tablet Take 1,000 Units by mouth daily.    [provider]  clopidogrel (PLAVIX) 75 MG tablet Take 1 tablet (75 mg total) by mouth daily.  04/28/19   Amin, Jeanella Flattery, MD  coal tar (NEUTROGENA T-GEL) 0.5 % shampoo Apply 1 application topically 2 (two) times a week. Tuesday and Friday    [provider]  dextromethorphan (DELSYM) 30 MG/5ML liquid Take 60 mg by mouth 2 (two) times daily as needed for cough.    [provider]  docusate sodium (COLACE) 100 MG capsule Take 200 mg by mouth daily.    [provider]  fluticasone (FLONASE) 50 MCG/ACT nasal spray Place 1 spray into both nostrils daily.    [provider]  furosemide (LASIX) 40 MG tablet Take 1 tablet (40 mg total) by mouth  every other day. 04/28/19   Amin, Jeanella Flattery, MD  ibuprofen (ADVIL,MOTRIN) 200 MG tablet Take 400 mg by mouth every 6 (six) hours as needed for moderate pain.    [provider]  ipratropium-albuterol (DUONEB) 0.5-2.5 (3) MG/3ML SOLN Take 3 mLs by nebulization 4 (four) times daily.     [provider]  losartan (COZAAR) 25 MG tablet Take 1 tablet (25 mg total) by mouth daily. 04/28/19   Amin, Jeanella Flattery, MD  magnesium hydroxide (MILK OF MAGNESIA) 400 MG/5ML suspension Take 30 mLs by mouth daily as needed for mild constipation.    [provider]  oxybutynin (DITROPAN-XL) 5 MG 24 hr tablet Take 5 mg by mouth at bedtime.    [provider]  phenazopyridine (PYRIDIUM) 200 MG tablet Take 200 mg by mouth 3 (three) times daily as needed for pain.    [provider]  polyethylene glycol (MIRALAX / GLYCOLAX) packet Take 17 g by mouth daily.    [provider]  polyvinyl alcohol (LIQUIFILM TEARS) 1.4 % ophthalmic solution Place 1 drop into both eyes 2 (two) times daily.    [provider]  Probiotic Product (RISA-BID PROBIOTIC PO) Take 2 tablets by mouth 2 (two) times a day.    [provider]  senna (SENOKOT) 8.6 MG tablet Take 2 tablets by mouth at bedtime.    [provider]  Simethicone 180 MG CAPS Take 180 mg by mouth 2 (two) times daily as needed (gas).    [provider]  sodium fluoride (DENTAGEL) 1.1 % GEL dental gel Place 1 application onto teeth 2 (two) times daily.    [provider]  spironolactone (ALDACTONE) 25 MG tablet Take 1 tablet (25 mg total) by mouth daily. 04/28/19   Amin, Jeanella Flattery, MD  vitamin B-12 (CYANOCOBALAMIN) 1000 MCG tablet Take 1,000 mcg by mouth daily.    [provider]  warfarin (COUMADIN) 2.5 MG tablet Take 1 tablet (2.5 mg total) by mouth daily at 6 PM. 04/27/19   Amin, Jeanella Flattery, MD  zinc oxide 20 % ointment Apply 1 application topically 2 (two) times daily  as needed (scrotum for redness).     [provider]  zinc sulfate 220 (50 Zn) MG capsule Take 220 mg by mouth daily.    [provider]    Family History Family History  Family history unknown: Yes    Social History Social History   Tobacco Use   Smoking status: Former Smoker   Smokeless tobacco: Never Used  Substance Use Topics   Alcohol use: Not Currently   Drug use: Never     Allergies   Bactrim [sulfamethoxazole-trimethoprim]   Review of Systems Review of Systems  All other systems reviewed and are negative.    Physical Exam Updated Vital Signs BP 129/62    Pulse 98  Temp 99.4 F (37.4 C) (Rectal)    Resp (!) 27    Ht 6\' 1"  (1.854 m)    Wt 83.9 kg    SpO2 97%    BMI 24.41 kg/m   Physical Exam Vitals signs and nursing note reviewed. Exam conducted with a chaperone present.  Constitutional:      Appearance: Normal appearance.  HENT:     Head: Normocephalic and atraumatic.  Eyes:     General: No scleral icterus.    Extraocular Movements: Extraocular movements intact.     Conjunctiva/sclera: Conjunctivae normal.     Pupils: Pupils are equal, round, and reactive to light.  Neck:     Musculoskeletal: Normal range of motion.  Cardiovascular:     Rate and Rhythm: Tachycardia present.  Pulmonary:     Breath sounds: Normal breath sounds.     Comments: Slightly increased work of breathing. Abdominal:     Comments: Abdomen appears mildly distended, but no masses appreciated.  Not rigid.  Bowel sounds normoactive.  Significant tenderness to palpation diffusely.  No overlying erythema or discoloration.  Genitourinary:    Comments: Yellowish milky penile discharge. Skin:    General: Skin is dry.  Neurological:     Mental Status: He is alert and oriented to person, place, and time.     GCS: GCS eye subscore is 4. GCS verbal subscore is 5. GCS motor subscore is 6.  Psychiatric:        Mood and Affect: Mood normal.        Behavior: Behavior  normal.        Thought Content: Thought content normal.      ED Treatments / Results  Labs (all labs ordered are listed, but only abnormal results are displayed) Labs Reviewed  COMPREHENSIVE METABOLIC PANEL - Abnormal; Notable for the following components:      Result Value   Sodium 133 (*)    Potassium 5.2 (*)    Chloride 97 (*)    CO2 21 (*)    Glucose, Bld 183 (*)    BUN 30 (*)    Creatinine, Ser 2.26 (*)    Total Protein 9.0 (*)    Albumin 3.1 (*)    GFR calc non Af Amer 27 (*)    GFR calc Af Amer 31 (*)    All other components within normal limits  CBC - Abnormal; Notable for the following components:   WBC 40.5 (*)    Hemoglobin 12.6 (*)    HCT 38.4 (*)    RDW 16.1 (*)    Platelets 631 (*)    All other components within normal limits  PROTIME-INR - Abnormal; Notable for the following components:   Prothrombin Time 33.7 (*)    INR 3.4 (*)    All other components within normal limits  LACTIC ACID, PLASMA - Abnormal; Notable for the following components:   Lactic Acid, Venous 2.2 (*)    All other components within normal limits  APTT - Abnormal; Notable for the following components:   aPTT 48 (*)    All other components within normal limits  SARS CORONAVIRUS 2 (HOSPITAL ORDER, [place] [facility])  CULTURE, BLOOD (ROUTINE X 2)  CULTURE, BLOOD (ROUTINE X 2)  URINE CULTURE  LACTIC ACID, PLASMA  URINALYSIS, ROUTINE W REFLEX MICROSCOPIC  POC OCCULT BLOOD, ED  TYPE AND SCREEN  ABO/RH    EKG EKG Interpretation  Date/Time:  Sunday June 30 2019 02:21:25 EDT Ventricular Rate:  135  PR Interval:    QRS Duration: 108 QT Interval:  385 QTC Calculation: 578 R Axis:   -53 Text Interpretation:  Sinus tachycardia Incomplete left bundle branch block Prolonged QT interval Confirmed by Addison Lank [id number]) on 06/30/2019 3:07:41 AM   Radiology Ct Abdomen Pelvis Wo Contrast  Result Date: 06/30/2019 CLINICAL DATA:  Coffee ground emesis.  Black  stools. EXAM: CT ABDOMEN AND PELVIS WITHOUT CONTRAST TECHNIQUE: Multidetector CT imaging of the abdomen and pelvis was performed following the standard protocol without IV contrast. COMPARISON:  08/28/2018 FINDINGS: Lower chest: 6.1 x 4.4 cm soft tissue mass is identified in the infrahilar medial right lower lobe. There is some minimal dependent airspace opacity in the lower lobes bilaterally suggesting pneumonia. Imaging of the lung bases is motion degraded, but underlying emphysema suspected. Hepatobiliary: No focal abnormality in the liver on this study without intravenous contrast. Calcified gallstones evident. No intrahepatic or extrahepatic biliary dilation. Pancreas: No focal mass lesion. No dilatation of the main duct. No intraparenchymal cyst. No peripancreatic edema. Spleen: No splenomegaly. No focal mass lesion. Adrenals/Urinary Tract: Left adrenal gland unremarkable. 2.3 cm right adrenal nodule is new in the interval. Marked atrophy noted upper pole right kidney, similar to prior with nonobstructive stone disease evident. 9 mm nonobstructing stone identified in the lower pole left kidney with nonobstructing smaller stone noted in the upper pole left kidney. No evidence for hydroureter. Bladder is nondistended which may account for the circumferential bladder wall thickening. Stomach/Bowel: Stomach is unremarkable. No gastric wall thickening. No evidence of outlet obstruction. Duodenum is normally positioned as is the ligament of Treitz. Proximal small bowel loops are dilated up to 4.7 cm diameter. A transition zone is identified just distal to the ligament of Treitz (axial image 38/3 and coronal image 46/6). Proximal jejunal loops represent the dilated small bowel. These loops remain dilated and become fluid-filled more distally where a second transition zone is identified in the central pelvis (axial 63/3 and coronal 49/6). Small bowel loops distal to this second transition zone are largely  decompressed. Terminal ileum unremarkable. The appendix is not visualized, but there is no edema or inflammation in the region of the cecum. No gross colonic mass. No colonic wall thickening. Vascular/Lymphatic: There is abdominal aortic atherosclerosis without aneurysm. There is no gastrohepatic or hepatoduodenal ligament lymphadenopathy. No intraperitoneal or retroperitoneal lymphadenopathy. No pelvic sidewall lymphadenopathy. Reproductive: Prostate gland not clearly visualized suggesting prostatectomy. Other: No substantial intraperitoneal free fluid. Musculoskeletal: Status post ORIF for left femoral neck fracture. Bones are diffusely demineralized. IMPRESSION: 1. Small bowel obstruction with jejunal loops dilated up to 4.7 cm diameter. 2 separate transition zones are identified, 1 in the proximal jejunum just distal to the ligament of Treitz. The second is in the mid to distal small bowel and located in the central pelvis. Given the presence of 2 transition zones, closed loop obstruction is a distinct concern. No substantial bowel wall thickening or evidence of pneumatosis. No substantial mesenteric edema, interloop mesenteric fluid, or free fluid in the peritoneal cavity. 2. 6.1 x 4.4 cm soft tissue mass in the medial right lower lobe highly concerning for neoplasm. 3. 2.3 cm right adrenal nodule.  Metastatic disease a concern. 4.  Aortic Atherosclerois (ICD10-170.0) Electronically Signed   By: Misty Stanley M.D.   On: 06/30/2019 06:33   Dg Chest Port 1 View  Result Date: 06/30/2019 CLINICAL DATA:  Initial evaluation for acute shortness of breath. EXAM: PORTABLE CHEST 1 VIEW COMPARISON:  Prior radiograph from 04/28/2019. FINDINGS: Cardiac and  mediastinal silhouettes are stable in size and contour, and remain within normal limits. Lungs mildly hypoinflated. Mild chronic bronchitic changes. No consolidative airspace disease. No edema or effusion. No pneumothorax. No acute osseous finding. IMPRESSION: 1. Mild  chronic bronchitic changes, most notable at the lung bases. 2. No other superimposed active cardiopulmonary disease. Electronically Signed   By: Jeannine Boga M.D.   On: 06/30/2019 04:36    Procedures Procedures (including critical care time)  Medications Ordered in ED Medications  vancomycin (VANCOCIN) IVPB 750 mg/150 ml premix (has no administration in time range)  ceFEPIme (MAXIPIME) 2 g in sodium chloride 0.9 % 100 mL IVPB (has no administration in time range)  lactated ringers bolus 1,000 mL (0 mLs Intravenous Stopped 06/30/19 0443)    And  lactated ringers bolus 1,000 mL (0 mLs Intravenous Stopped 06/30/19 0643)    And  lactated ringers bolus 500 mL (0 mLs Intravenous Stopped 06/30/19 0645)  ceFEPIme (MAXIPIME) 2 g in sodium chloride 0.9 % 100 mL IVPB (0 g Intravenous Stopped 06/30/19 0642)  metroNIDAZOLE (FLAGYL) IVPB 500 mg (0 mg Intravenous Stopped 06/30/19 0643)  vancomycin (VANCOCIN) 1,500 mg in sodium chloride 0.9 % 500 mL IVPB (1,500 mg Intravenous New Bag/Given 06/30/19 0442)     Initial Impression / Assessment and Plan / ED Course  I have reviewed the triage vital signs and the nursing notes.  Pertinent labs & imaging results that were available during my care of the patient were reviewed by me and considered in my medical decision making (see chart for details).       Patient had white count of 40.5 and was significantly tachycardic on his presentation.  Placed ED sepsis orders.  Patient is adamant that he has gastric spasms is why he is tender to palpation diffusely in his abdomen.  Denies any changes in his abdominal discomfort.  Patient denies any coffee-ground emesis and instead states that he was simply consuming mostly prior to his emesis.  His fecal occult was negative and his feces were brown.  His hemoglobin was only slightly low and I do not suspect GI bleed as the cause of her symptoms.  While he endorses increasing shortness of breath, he denies any  cough.  Chest x-ray obtained was reviewed and does not demonstrate any evidence of an acute cardiopulmonary process.  Given the patient's significant abdominal tenderness to palpation diffusely in addition to his lab values and vitals, obtained CT abdomen to rule out obstructive or infectious process.  CT abdomen was reviewed and demonstrates a small bowel obstruction as well as a mass in the right lower lobe and an adrenal nodule that are concerning for metastatic disease.   At shift change care was transferred to [facility], PA-C who will follow pending studies, re-evaluate, and determine disposition.      CRITICAL CARE Performed by: Krista Blue Patient was septic and required STAT fluids, cultures, and IV antibiotics.  Total critical care time: 40 minutes  Critical care time was exclusive of separately billable procedures and treating other patients.  Critical care was necessary to treat or prevent imminent or life-threatening deterioration.  Critical care was time spent personally by me on the following activities: development of treatment plan with patient and/or surrogate as well as nursing, discussions with consultants, evaluation of patient's response to treatment, examination of patient, obtaining history from patient or surrogate, ordering and performing treatments and interventions, ordering and review of laboratory studies, ordering and review of radiographic studies, pulse oximetry and re-evaluation of  patient's condition.   Final Clinical Impressions(s) / ED Diagnoses   Final diagnoses:  None    ED Discharge Orders    None       Corena Herter, PA-C 06/30/19 0741    Fatima Blank, MD 06/30/19 1645

## 2019-06-30 NOTE — Progress Notes (Addendum)
1701 Received pt into 5w room 28. 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 Of note, no need for airborn/contact as COVID swab negative per Epic.  Pt placed on low intermittent suction with immediate return of yellow, tan material. Pt on 2L [place]. Pt asking for something to drink, discussed need for NPO status.  Skin check performed with Jenny Reichmann, RN and Barnetta Chapel, RN. Stage 2 noted to patient's LEFT buttock - approx 1 cm diameter, no drainage noted on sacral foam placed by ED. Foam soiled, replaced. Reddened area in linear pattern noted to patient's LEFT neck; assessed and cleaned. Skin dry to neck and folds. Some redness noted to scrotum, all intact.  Pt placed on telemetry monitor. Patient very uncomfortable with any pressure to legs (such as bathing) and somewhat to abdomen and skin. Legs are stiff.  1729 - Central monitoring called that patient had low SpO2 reading: number reading 70% but poor waveform. Pt awake, alert, oriented (pt is very difficult to understand and slightly hard of hearing), color good. Switched pulse ox sites. Of note, pt has some clubbing of fingernails and sluggish cap refill. I got temporary good waveform and 93% on 2L . Pt with strong, productive cough, [place] oral suction offered. Pt declined at this time. Swab stick given.

## 2019-06-30 NOTE — ED Notes (Addendum)
ED TO INPATIENT HANDOFF REPORT  ED Nurse Name and Phone #: Thurmond Butts [place] Name/Age/Gender Luis Lyons 78 y.o. male Room/Bed: 033C/033C  Code Status   Code Status: DNR  Home/SNF/Other Skilled nursing facility Patient oriented to: self, place, time and situation Is this baseline? Yes   Triage Complete: Triage complete  Chief Complaint GI bleeding  Triage Note Pt bib by ems from facility due to an episode of coffee ground emesis and "possible" black tarry stools. On Plavix and recently started on coumadin. Pt arrives alert hr in 130's.   Allergies Allergies  Allergen Reactions  . Bactrim [Sulfamethoxazole-Trimethoprim] Other (See Comments)    Listed on MAR    Level of Care/Admitting Diagnosis ED Disposition    ED Disposition Condition [place]: [facility] [100100]  Level of Care: Progressive [102]  Covid Evaluation: Asymptomatic Screening Protocol (No Symptoms)  Diagnosis: SBO (small bowel obstruction) [facility]) [id number]  Admitting Physician: Oda Kilts [id number]  Attending Physician: Oda Kilts [id number]  Estimated length of stay: past midnight tomorrow  Certification:: I certify this patient will need inpatient services for at least 2 midnights  PT Class (Do Not Modify): Inpatient [101]  PT Acc Code (Do Not Modify): Private [1]       B Medical/Surgery History Past Medical History:  Diagnosis Date  . Abdominal hernia   . Cancer ([place])   . Constipation   . Cough   . Depression   . Dry mouth   . Hard of hearing   . Hypertension   . Muscle weakness   . Stroke ([place])   . Urinary incontinence   . Vitamin D deficiency   . Vomiting   . Wheezing    Past Surgical History:  Procedure Laterality Date  . ABDOMINAL SURGERY    . LAPAROTOMY N/A 09/05/2018   Procedure: EXPLORATORY LAPAROTOMY;  Surgeon: Georganna Skeans, MD;  Location: [facility];  Service: General;  Laterality: N/A;     A IV  Location/Drains/Wounds Patient Lines/Drains/Airways Status   Active Line/Drains/Airways    Name:   Placement date:   Placement time:   Site:   Days:   Peripheral IV 04/25/19 Left;Anterior Forearm   04/25/19    2303    Forearm   66   Peripheral IV 06/30/19 Right Antecubital   06/30/19    0401    Antecubital   less than 1   Midline Single Lumen [id number] Midline Basilic 8 cm 0 cm   [id number]    [id number]    Basilic   67   NG/OG Tube Nasogastric 16 Fr. Right nare Aucultation   06/30/19    0807    Right nare   less than 1   External Urinary Catheter   09/07/18    1030    -   296   Incision (Closed) 09/05/18 Abdomen Other (Comment)   09/05/18    1230     298          Intake/Output Last 24 hours  Intake/Output Summary (Last 24 hours) at 06/30/2019 1519 Last data filed at 06/30/2019 1106 Gross per 24 hour  Intake 1550 ml  Output -  Net 1550 ml    Labs/Imaging Results for orders placed or performed during the hospital encounter of 06/30/19 (from the past 48 hour(s))  Comprehensive metabolic panel     Status: Abnormal   Collection Time: 06/30/19  2:23 AM  Result Value Ref Range   Sodium 133 (L)  135 - 145 mmol/L   Potassium 5.2 (H) 3.5 - 5.1 mmol/L   Chloride 97 (L) 98 - 111 mmol/L   CO2 21 (L) 22 - 32 mmol/L   Glucose, Bld 183 (H) 70 - 99 mg/dL   BUN 30 (H) 8 - 23 mg/dL   Creatinine, Ser 2.26 (H) 0.61 - 1.24 mg/dL   Calcium 9.8 8.9 - 10.3 mg/dL   Total Protein 9.0 (H) 6.5 - 8.1 g/dL   Albumin 3.1 (L) 3.5 - 5.0 g/dL   AST 19 15 - 41 U/L   ALT 16 0 - 44 U/L   Alkaline Phosphatase 83 38 - 126 U/L   Total Bilirubin 0.6 0.3 - 1.2 mg/dL   GFR calc non Af Amer 27 (L) >60 mL/min   GFR calc Af Amer 31 (L) >60 mL/min   Anion gap 15 5 - 15    Comment: Performed at [facility] [facility] [address], [place], [place] [address]  CBC     Status: Abnormal   Collection Time: 06/30/19  2:23 AM  Result Value Ref Range   WBC 40.5 (H) 4.0 - 10.5 K/uL   RBC 4.37 4.22 - 5.81 MIL/uL   Hemoglobin 12.6 (L)  13.0 - 17.0 g/dL   HCT 38.4 (L) 39.0 - 52.0 %   MCV 87.9 80.0 - 100.0 fL   MCH 28.8 26.0 - 34.0 pg   MCHC 32.8 30.0 - 36.0 g/dL   RDW 16.1 (H) 11.5 - 15.5 %   Platelets 631 (H) 150 - 400 K/uL   nRBC 0.0 0.0 - 0.2 %    Comment: Performed at [facility], [place] [address] [place], [place] [address]  Protime-INR - (order if Patient is taking Coumadin / Warfarin)     Status: Abnormal   Collection Time: 06/30/19  2:23 AM  Result Value Ref Range   Prothrombin Time 33.7 (H) 11.4 - 15.2 seconds   INR 3.4 (H) 0.8 - 1.2    Comment: (NOTE) INR goal varies based on device and disease states. Performed at [facility], [place] [address], [place], [place] [address]   APTT     Status: Abnormal   Collection Time: 06/30/19  2:23 AM  Result Value Ref Range   aPTT 48 (H) 24 - 36 seconds    Comment:        IF BASELINE aPTT IS ELEVATED, SUGGEST PATIENT RISK ASSESSMENT BE USED TO DETERMINE APPROPRIATE ANTICOAGULANT THERAPY. Performed at [facility], [place] [address], [place], [place] [address]   Type and screen [place]     Status: None   Collection Time: 06/30/19  2:25 AM  Result Value Ref Range   ABO/RH(D) A POS    Antibody Screen NEG    Sample Expiration      07/03/2019,2359 Performed at [facility], [place] [address], [place],  [address]   ABO/Rh     Status: None   Collection Time: 06/30/19  2:25 AM  Result Value Ref Range   ABO/RH(D)      A POS Performed at [facility] [address] [place], [place] [address]   Lactic acid, plasma     Status: Abnormal   Collection Time: 06/30/19  2:27 AM  Result Value Ref Range   Lactic Acid, Venous 2.2 (HH) 0.5 - 1.9 mmol/L    Comment: CRITICAL RESULT CALLED TO, READ BACK BY AND VERIFIED WITH: BROOKS M,RN 06/30/19 [place] Performed  at [facility], [place] [address] [place], [place] [address]   POC occult blood, ED     Status: None   Collection Time: 06/30/19  3:13 AM  Result Value  Ref Range   Fecal Occult Bld NEGATIVE NEGATIVE  SARS Coronavirus 2 [facility] order, Performed in [facility] hospital lab) Nasopharyngeal Nasopharyngeal Swab     Status: None   Collection Time: 06/30/19  4:20 AM   Specimen: Nasopharyngeal Swab  Result Value Ref Range   SARS Coronavirus 2 NEGATIVE NEGATIVE    Comment: (NOTE) If result is NEGATIVE SARS-CoV-2 target nucleic acids are NOT DETECTED. The SARS-CoV-2 RNA is generally detectable in upper and lower  respiratory specimens during the acute phase of infection. The lowest  concentration of SARS-CoV-2 viral copies this assay can detect is 250  copies / mL. A negative result does not preclude SARS-CoV-2 infection  and should not be used as the sole basis for treatment or other  patient management decisions.  A negative result may occur with  improper specimen collection / handling, submission of specimen other  than nasopharyngeal swab, presence of viral mutation(s) within the  areas targeted by this assay, and inadequate number of viral copies  (<250 copies / mL). A negative result must be combined with clinical  observations, patient history, and epidemiological information. If result is POSITIVE SARS-CoV-2 target nucleic acids are DETECTED. The SARS-CoV-2 RNA is generally detectable in upper and lower  respiratory specimens dur ing the acute phase of infection.  Positive  results are indicative of active infection with SARS-CoV-2.  Clinical  correlation with patient history and other diagnostic information is  necessary to determine patient infection status.  Positive results do  not rule out bacterial infection or co-infection with other viruses. If result is PRESUMPTIVE POSTIVE SARS-CoV-2 nucleic acids MAY BE PRESENT.   A presumptive positive result was obtained on the submitted specimen  and confirmed on repeat testing.  While 2019 novel coronavirus  (SARS-CoV-2) nucleic acids may be present in the submitted sample   additional confirmatory testing may be necessary for epidemiological  and / or clinical management purposes  to differentiate between  SARS-CoV-2 and other Sarbecovirus currently known to infect humans.  If clinically indicated additional testing with an alternate test  methodology [phone]) is advised. The SARS-CoV-2 RNA is generally  detectable in upper and lower respiratory sp ecimens during the acute  phase of infection. The expected result is Negative. Fact Sheet for Patients:  StrictlyIdeas.no Fact Sheet for Healthcare Providers: BankingDealers.co.za This test is not yet approved or cleared by the [place] FDA and has been authorized for detection and/or diagnosis of SARS-CoV-2 by FDA under an Emergency Use Authorization (EUA).  This EUA will remain in effect (meaning this test can be used) for the duration of the COVID-19 declaration under Section 564(b)(1) of the Act, 21 U.S.C. section 360bbb-3(b)(1), unless the authorization is terminated or revoked sooner. Performed at [facility], [place] [address], [place], [place] [address]   Lactic acid, plasma     Status: None   Collection Time: 06/30/19  8:19 AM  Result Value Ref Range   Lactic Acid, Venous 1.6 0.5 - 1.9 mmol/L    Comment: Performed at [place] [address], [place], [place] [address]   Ct Abdomen Pelvis Wo Contrast  Result Date: 06/30/2019 CLINICAL DATA:  Coffee ground emesis.  Black stools. EXAM: CT ABDOMEN AND PELVIS WITHOUT CONTRAST TECHNIQUE: Multidetector CT imaging of the abdomen  and pelvis was performed following the standard protocol without IV contrast. COMPARISON:  08/28/2018 FINDINGS: Lower chest: 6.1 x 4.4 cm soft tissue mass is identified in the infrahilar medial right lower lobe. There is some minimal dependent airspace opacity in the lower lobes bilaterally suggesting pneumonia. Imaging of the lung bases is motion degraded, but  underlying emphysema suspected. Hepatobiliary: No focal abnormality in the liver on this study without intravenous contrast. Calcified gallstones evident. No intrahepatic or extrahepatic biliary dilation. Pancreas: No focal mass lesion. No dilatation of the main duct. No intraparenchymal cyst. No peripancreatic edema. Spleen: No splenomegaly. No focal mass lesion. Adrenals/Urinary Tract: Left adrenal gland unremarkable. 2.3 cm right adrenal nodule is new in the interval. Marked atrophy noted upper pole right kidney, similar to prior with nonobstructive stone disease evident. 9 mm nonobstructing stone identified in the lower pole left kidney with nonobstructing smaller stone noted in the upper pole left kidney. No evidence for hydroureter. Bladder is nondistended which may account for the circumferential bladder wall thickening. Stomach/Bowel: Stomach is unremarkable. No gastric wall thickening. No evidence of outlet obstruction. Duodenum is normally positioned as is the ligament of Treitz. Proximal small bowel loops are dilated up to 4.7 cm diameter. A transition zone is identified just distal to the ligament of Treitz (axial image 38/3 and coronal image 46/6). Proximal jejunal loops represent the dilated small bowel. These loops remain dilated and become fluid-filled more distally where a second transition zone is identified in the central pelvis (axial 63/3 and coronal 49/6). Small bowel loops distal to this second transition zone are largely decompressed. Terminal ileum unremarkable. The appendix is not visualized, but there is no edema or inflammation in the region of the cecum. No gross colonic mass. No colonic wall thickening. Vascular/Lymphatic: There is abdominal aortic atherosclerosis without aneurysm. There is no gastrohepatic or hepatoduodenal ligament lymphadenopathy. No intraperitoneal or retroperitoneal lymphadenopathy. No pelvic sidewall lymphadenopathy. Reproductive: Prostate gland not clearly  visualized suggesting prostatectomy. Other: No substantial intraperitoneal free fluid. Musculoskeletal: Status post ORIF for left femoral neck fracture. Bones are diffusely demineralized. IMPRESSION: 1. Small bowel obstruction with jejunal loops dilated up to 4.7 cm diameter. 2 separate transition zones are identified, 1 in the proximal jejunum just distal to the ligament of Treitz. The second is in the mid to distal small bowel and located in the central pelvis. Given the presence of 2 transition zones, closed loop obstruction is a distinct concern. No substantial bowel wall thickening or evidence of pneumatosis. No substantial mesenteric edema, interloop mesenteric fluid, or free fluid in the peritoneal cavity. 2. 6.1 x 4.4 cm soft tissue mass in the medial right lower lobe highly concerning for neoplasm. 3. 2.3 cm right adrenal nodule.  Metastatic disease a concern. 4.  Aortic Atherosclerois (ICD10-170.0) Electronically Signed   By: Misty Stanley M.D.   On: 06/30/2019 06:33   Dg Chest Port 1 View  Result Date: 06/30/2019 CLINICAL DATA:  Initial evaluation for acute shortness of breath. EXAM: PORTABLE CHEST 1 VIEW COMPARISON:  Prior radiograph from 04/28/2019. FINDINGS: Cardiac and mediastinal silhouettes are stable in size and contour, and remain within normal limits. Lungs mildly hypoinflated. Mild chronic bronchitic changes. No consolidative airspace disease. No edema or effusion. No pneumothorax. No acute osseous finding. IMPRESSION: 1. Mild chronic bronchitic changes, most notable at the lung bases. 2. No other superimposed active cardiopulmonary disease. Electronically Signed   By: Jeannine Boga M.D.   On: 06/30/2019 04:36   Dg Abd Portable 1v  Result Date: 06/30/2019  CLINICAL DATA:  NG tube placement. Verification of placement of NG Tube EXAM: PORTABLE ABDOMEN - 1 VIEW COMPARISON:  None. FINDINGS: NG tube in stomach. Side port GE junction. Dilated loops of small bowel. IMPRESSION: 1. NG tube  in stomach.  Side port GE junction 2. Dilated loops small bowel. Electronically Signed   By: Suzy Bouchard M.D.   On: 06/30/2019 09:12    Pending Labs Unresulted Labs (From admission, onward)    Start     Ordered   07/01/19 0500  Comprehensive metabolic panel  Tomorrow morning,   R     06/30/19 0929   07/01/19 0500  CBC  Tomorrow morning,   R     06/30/19 0929   06/30/19 0321  Blood Culture (routine x 2)  BLOOD CULTURE X 2,   STAT     06/30/19 0324   06/30/19 0321  Urinalysis, Routine w reflex microscopic  ONCE - STAT,   STAT     06/30/19 0324   06/30/19 0321  Urine culture  ONCE - STAT,   STAT     06/30/19 0324          Vitals/Pain Today's Vitals   06/30/19 1215 06/30/19 1230 06/30/19 1245 06/30/19 1300  BP: (!) 105/45 (!) 124/44 (!) 127/51 (!) 112/48  Pulse: 73 74 82 82  Resp: (!) 26 (!) 24 (!) 24 (!) 23  Temp:      TempSrc:      SpO2: 98% 98% 99% 98%  Weight:      Height:        Isolation Precautions Airborne and Contact precautions  Medications Medications  vancomycin (VANCOCIN) IVPB 750 mg/150 ml premix (has no administration in time range)  ceFEPIme (MAXIPIME) 2 g in sodium chloride 0.9 % 100 mL IVPB (has no administration in time range)  polyvinyl alcohol (LIQUIFILM TEARS) 1.4 % ophthalmic solution 1 drop (has no administration in time range)  0.9 %  sodium chloride infusion ( Intravenous New Bag/Given 06/30/19 0930)  acetaminophen (TYLENOL) tablet 650 mg (has no administration in time range)    Or  acetaminophen (TYLENOL) suppository 650 mg (has no administration in time range)  pantoprazole (PROTONIX) injection 40 mg (40 mg Intravenous Given 06/30/19 1417)  lactated ringers bolus 1,000 mL (0 mLs Intravenous Stopped 06/30/19 0443)    And  lactated ringers bolus 1,000 mL (0 mLs Intravenous Stopped 06/30/19 0643)    And  lactated ringers bolus 500 mL (0 mLs Intravenous Stopped 06/30/19 0645)  ceFEPIme (MAXIPIME) 2 g in sodium chloride 0.9 % 100 mL IVPB (0 g  Intravenous Stopped 06/30/19 0642)  metroNIDAZOLE (FLAGYL) IVPB 500 mg (0 mg Intravenous Stopped 06/30/19 0643)  vancomycin (VANCOCIN) 1,500 mg in sodium chloride 0.9 % 500 mL IVPB (0 mg Intravenous Stopped 06/30/19 0743)  phytonadione (VITAMIN K) 5 mg in dextrose 5 % 50 mL IVPB (0 mg Intravenous Stopped 06/30/19 1106)  diatrizoate meglumine-sodium (GASTROGRAFIN) 66-10 % solution 90 mL (90 mLs Per NG tube Given 06/30/19 1008)    Mobility non-ambulatory Moderate fall risk   Focused Assessments    R Recommendations: See Admitting Provider Note  Report given to: Steele Sizer RN  Additional Notes:

## 2019-06-30 NOTE — ED Provider Notes (Signed)
Received patient at signout from [place].  Refer to provider note for full history and physical examination.  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  Initially thought that his emesis was actually an abnormal color after drinking a chocolate milkshake that he has had an episode of coffee-ground emesis in the ED.  Found to have a leukocytosis and elevated lactate so code sepsis was initiated and he received 30 cc/kg bolus of LR and started on broad-spectrum antibiotics.   CT of the abdomen pelvis shows small bowel obstruction with 2 transition points concerning for possible closed-loop obstruction, also notes soft tissue mass in the medial right lower lobe concerning for neoplasm as well as right adrenal nodule which could be concerning for metastatic disease.  Will need general surgery consult, hospital admission, will place NG tube and start Protonix for hematemesis and small bowel obstruction.  He is DNR.  Physical Exam  BP (!) 120/101   Pulse 87   Temp 99.4 F (37.4 C) (Rectal)   Resp 20   Ht 6\' 1"  (1.854 m)   Wt 83.9 kg   SpO2 94%   BMI 24.41 kg/m   Physical Exam Vitals signs and nursing note reviewed.  Constitutional:      General: He is not in acute distress.    Appearance: He is well-developed.     Comments: Elderly, chronically ill in appearance, hard of hearing  HENT:     Head: Normocephalic and atraumatic.  Eyes:     General:        Right eye: No discharge.        Left eye: No discharge.     Conjunctiva/sclera: Conjunctivae normal.  Neck:     Vascular: No JVD.     Trachea: No tracheal deviation.  Cardiovascular:     Rate and Rhythm: Normal rate.  Pulmonary:     Effort: Pulmonary effort is normal.  Abdominal:     General: There is no distension.  Skin:    General: Skin is warm and dry.     Findings: No erythema.   Neurological:     Mental Status: He is alert.  Psychiatric:        Behavior: Behavior normal.     ED Course/Procedures     Procedures  MDM  Patient is COVID negative.  CONSULT: Spoke with Margie Billet, general surgery PA; general surgery to consult while patient is admitted to the hospital.   Spoke with Dr. Sherry Ruffing with internal medicine teaching service who agrees to assume care of patient and bring him to the hospital for further evaluation and management.     Debroah Baller 06/30/19 1009    Virgel Manifold, MD 06/30/19 1115

## 2019-06-30 NOTE — Progress Notes (Signed)
Call from [place], Nurse Supervisor at [facility] [phone]), checking on patient. They shared that patient had large BM yesterday. They wanted him to know they were thinking about him. I conveyed this to patient.

## 2019-06-30 NOTE — Consult Note (Addendum)
[facility] Surgery Consult Note  Luis Lyons Feb 01, 1941  [id number].    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 Patient presented to ED from nursing facility complaining of abdominal pain, nausea, vomiting that started yesterday around 1300. He does have a prior h/o SBO requiring exploratory laparotomy lysis of adhesions 09/2018. States that his pain is mostly in his lower abdomen. The pain is intermittent, crampy, and not severe. Unsure when his last BM was, and he is not passing any flatus.  In the ED code sepsis was called due to WBC 40.5, lactic acid 2.2, resp 37, BP 93/65. He has been given multiple fluid boluses and was started on IV maxipime/vancomycin. Vital signs now stable.  CT abdomen shows SBO with 2 possible transition zones, no bowel wall thickening or evidence of pneumatosis. NG tube was placed and he was started on IV protonix. General surgery asked to see.  ROS: Review of Systems  Constitutional: Negative.  Negative for chills and fever.  HENT: Positive for hearing loss.   Eyes: Negative.   Respiratory: Positive for cough and sputum production. Negative for wheezing.   Cardiovascular: Negative.  Negative for chest pain.  Gastrointestinal: Positive for abdominal pain, constipation, nausea and vomiting.  Genitourinary: Negative.   Musculoskeletal: Positive for joint pain.  Skin: Negative.   Neurological: Positive for focal weakness.    All systems reviewed and otherwise negative except for as above  Family History  Family history unknown: Yes    Past Medical History:   Diagnosis Date  . Abdominal hernia   . Cancer ([place])   . Constipation   . Cough   . Depression   . Dry mouth   . Hard of hearing   . Hypertension   . Muscle weakness   . Stroke ([place])   . Urinary incontinence   . Vitamin D deficiency   . Vomiting   . Wheezing     Past Surgical History:  Procedure Laterality Date  . ABDOMINAL SURGERY    . LAPAROTOMY N/A 09/05/2018   Procedure: EXPLORATORY LAPAROTOMY;  Surgeon: Georganna Skeans, MD;  Location: [facility];  Service: General;  Laterality: N/A;    Social History:  reports that he has quit smoking. He has never used smokeless tobacco. He reports previous alcohol use. He reports that he does not use drugs.  Allergies:  Allergies  Allergen Reactions  . Bactrim [Sulfamethoxazole-Trimethoprim] Other (See Comments)    Listed on MAR    (Not in a hospital admission)   Prior to Admission medications   Medication Sig Start Date End Date Taking? Authorizing Provider  acetaminophen (TYLENOL) 325 MG tablet Take 650 mg by mouth every 4 (four) hours as needed (temp 100.4 or greater).     [provider]  albuterol (PROVENTIL) (2.5 MG/3ML) 0.083% nebulizer solution Take 2.5 mg by nebulization 2 (two) times daily as needed for wheezing or shortness of breath.    [provider]  atorvastatin (LIPITOR) 80 MG tablet Take 1 tablet (80 mg total) by mouth daily at 6 PM. 04/27/19   Amin, Jeanella Flattery, MD  carvedilol (COREG) 6.25 MG tablet Take 1 tablet (6.25 mg total)  by mouth 2 (two) times daily with a meal. 04/27/19   Amin, Jeanella Flattery, MD  chlorhexidine (PERIDEX) 0.12 % solution 15 mLs by Mouth Rinse route 2 (two) times a day.     [provider]  cholecalciferol (VITAMIN D3) 25 MCG (1000 UT) tablet Take 1,000 Units by mouth daily.    [provider]  clopidogrel (PLAVIX) 75 MG tablet Take 1 tablet (75 mg total) by mouth daily. 04/28/19   Amin, Jeanella Flattery, MD  coal tar (NEUTROGENA T-GEL) 0.5 % shampoo Apply 1  application topically 2 (two) times a week. Tuesday and Friday    [provider]  dextromethorphan (DELSYM) 30 MG/5ML liquid Take 60 mg by mouth 2 (two) times daily as needed for cough.    [provider]  docusate sodium (COLACE) 100 MG capsule Take 200 mg by mouth daily.    [provider]  fluticasone (FLONASE) 50 MCG/ACT nasal spray Place 1 spray into both nostrils daily.    [provider]  furosemide (LASIX) 40 MG tablet Take 1 tablet (40 mg total) by mouth every other day. 04/28/19   Amin, Jeanella Flattery, MD  ibuprofen (ADVIL,MOTRIN) 200 MG tablet Take 400 mg by mouth every 6 (six) hours as needed for moderate pain.    [provider]  ipratropium-albuterol (DUONEB) 0.5-2.5 (3) MG/3ML SOLN Take 3 mLs by nebulization 4 (four) times daily.     [provider]  losartan (COZAAR) 25 MG tablet Take 1 tablet (25 mg total) by mouth daily. 04/28/19   Amin, Jeanella Flattery, MD  magnesium hydroxide (MILK OF MAGNESIA) 400 MG/5ML suspension Take 30 mLs by mouth daily as needed for mild constipation.    [provider]  oxybutynin (DITROPAN-XL) 5 MG 24 hr tablet Take 5 mg by mouth at bedtime.    [provider]  phenazopyridine (PYRIDIUM) 200 MG tablet Take 200 mg by mouth 3 (three) times daily as needed for pain.    [provider]  polyethylene glycol (MIRALAX / GLYCOLAX) packet Take 17 g by mouth daily.    [provider]  polyvinyl alcohol (LIQUIFILM TEARS) 1.4 % ophthalmic solution Place 1 drop into both eyes 2 (two) times daily.    [provider]  Probiotic Product (RISA-BID PROBIOTIC PO) Take 2 tablets by mouth 2 (two) times a day.    [provider]  senna (SENOKOT) 8.6 MG tablet Take 2 tablets by mouth at bedtime.    [provider]  Simethicone 180 MG CAPS Take 180 mg by mouth 2 (two) times daily as needed (gas).    [provider]  sodium fluoride (DENTAGEL) 1.1 % GEL dental  gel Place 1 application onto teeth 2 (two) times daily.    [provider]  spironolactone (ALDACTONE) 25 MG tablet Take 1 tablet (25 mg total) by mouth daily. 04/28/19   Amin, Jeanella Flattery, MD  vitamin B-12 (CYANOCOBALAMIN) 1000 MCG tablet Take 1,000 mcg by mouth daily.    [provider]  warfarin (COUMADIN) 2.5 MG tablet Take 1 tablet (2.5 mg total) by mouth daily at 6 PM. 04/27/19   Amin, Jeanella Flattery, MD  zinc oxide 20 % ointment Apply 1 application topically 2 (two) times daily as needed (scrotum for redness).     [provider]  zinc sulfate 220 (50 Zn) MG capsule Take 220 mg by mouth daily.    [provider]    Blood pressure (!) 111/95, pulse 91, temperature 99.4 F (37.4 C),  temperature source Rectal, resp. rate (!) 26, height 6\' 1"  (1.854 m), weight 83.9 kg, SpO2 96 %. Physical Exam: General: pleasant, chronically ill appearing white male who is laying in bed in NAD HEENT: head is normocephalic, atraumatic.  Sclera are noninjected.  Pupils equal and round.  Ears and nose without any masses or lesions.  Mouth is dry. Dentition poor. Scar from prior trach Heart: regular, rate, and rhythm.  No obvious murmurs, gallops, or rubs noted.  Palpable pedal pulses bilaterally Lungs: few rhonchi bilaterally, no wheezes or rales.  Respiratory effort nonlabored Abd: well healed midline incision with palpable hernia defect, soft, mild distension, +BS, TTP lower quadrants without guarding or peritonitis MS: calves soft and nontender Skin: warm and dry with no masses, lesions, or rashes Psych: A&Ox3 with an appropriate affect.  Results for orders placed or performed during the hospital encounter of 06/30/19 (from the past 48 hour(s))  Comprehensive metabolic panel     Status: Abnormal   Collection Time: 06/30/19  2:23 AM  Result Value Ref Range   Sodium 133 (L) 135 - 145 mmol/L   Potassium 5.2 (H) 3.5 - 5.1 mmol/L   Chloride 97 (L) 98 - 111 mmol/L   CO2 21 (L)  22 - 32 mmol/L   Glucose, Bld 183 (H) 70 - 99 mg/dL   BUN 30 (H) 8 - 23 mg/dL   Creatinine, Ser 2.26 (H) 0.61 - 1.24 mg/dL   Calcium 9.8 8.9 - 10.3 mg/dL   Total Protein 9.0 (H) 6.5 - 8.1 g/dL   Albumin 3.1 (L) 3.5 - 5.0 g/dL   AST 19 15 - 41 U/L   ALT 16 0 - 44 U/L   Alkaline Phosphatase 83 38 - 126 U/L   Total Bilirubin 0.6 0.3 - 1.2 mg/dL   GFR calc non Af Amer 27 (L) >60 mL/min   GFR calc Af Amer 31 (L) >60 mL/min   Anion gap 15 5 - 15    Comment: Performed at [facility], [address], [place], [place] [address]  CBC     Status: Abnormal   Collection Time: 06/30/19  2:23 AM  Result Value Ref Range   WBC 40.5 (H) 4.0 - 10.5 K/uL   RBC 4.37 4.22 - 5.81 MIL/uL   Hemoglobin 12.6 (L) 13.0 - 17.0 g/dL   HCT 38.4 (L) 39.0 - 52.0 %   MCV 87.9 80.0 - 100.0 fL   MCH 28.8 26.0 - 34.0 pg   MCHC 32.8 30.0 - 36.0 g/dL   RDW 16.1 (H) 11.5 - 15.5 %   Platelets 631 (H) 150 - 400 K/uL   nRBC 0.0 0.0 - 0.2 %    Comment: Performed at [facility], [place] [address] [place], [place] [address]  Protime-INR - (order if Patient is taking Coumadin / Warfarin)     Status: Abnormal   Collection Time: 06/30/19  2:23 AM  Result Value Ref Range   Prothrombin Time 33.7 (H) 11.4 - 15.2 seconds   INR 3.4 (H) 0.8 - 1.2    Comment: (NOTE) INR goal varies based on device and disease states. Performed at [facility], [place] [address] [place], [place] [address]   APTT     Status: Abnormal   Collection Time: 06/30/19  2:23 AM  Result Value Ref Range   aPTT 48 (H) 24 - 36 seconds    Comment:        IF BASELINE aPTT IS ELEVATED, SUGGEST PATIENT RISK  ASSESSMENT BE USED TO DETERMINE APPROPRIATE ANTICOAGULANT THERAPY. Performed at [facility], [place] [address], [place], [place] [address]   Type and screen [place]     Status: None   Collection Time: 06/30/19  2:25 AM  Result Value Ref Range   ABO/RH(D) A POS    Antibody Screen NEG    Sample Expiration       07/03/2019,2359 Performed at [facility], [place] [address] [place], [place] [address]   ABO/Rh     Status: None (Preliminary result)   Collection Time: 06/30/19  2:25 AM  Result Value Ref Range   ABO/RH(D)      A POS Performed at [facility] [address] [place], [place] [address]   Lactic acid, plasma     Status: Abnormal   Collection Time: 06/30/19  2:27 AM  Result Value Ref Range   Lactic Acid, Venous 2.2 (HH) 0.5 - 1.9 mmol/L    Comment: CRITICAL RESULT CALLED TO, READ BACK BY AND VERIFIED WITH: BROOKS M,RN 06/30/19 0359 WAYK Performed at [facility], [place] [address], [place], [place] [address]   POC occult blood, ED     Status: None   Collection Time: 06/30/19  3:13 AM  Result Value Ref Range   Fecal Occult Bld NEGATIVE NEGATIVE  SARS Coronavirus 2 [facility] order, Performed in [facility] hospital lab) Nasopharyngeal Nasopharyngeal Swab     Status: None   Collection Time: 06/30/19  4:20 AM   Specimen: Nasopharyngeal Swab  Result Value Ref Range   SARS Coronavirus 2 NEGATIVE NEGATIVE    Comment: (NOTE) If result is NEGATIVE SARS-CoV-2 target nucleic acids are NOT DETECTED. The SARS-CoV-2 RNA is generally detectable in upper and lower  respiratory specimens during the acute phase of infection. The lowest  concentration of SARS-CoV-2 viral copies this assay can detect is 250  copies / mL. A negative result does not preclude SARS-CoV-2 infection  and should not be used as the sole basis for treatment or other  patient management decisions.  A negative result may occur with  improper specimen collection / handling, submission of specimen other  than nasopharyngeal swab, presence of viral mutation(s) within the  areas targeted by this assay, and inadequate number of viral copies  (<250 copies / mL). A negative result must be combined with clinical  observations, patient history, and epidemiological information. If result is POSITIVE SARS-CoV-2 target  nucleic acids are DETECTED. The SARS-CoV-2 RNA is generally detectable in upper and lower  respiratory specimens dur ing the acute phase of infection.  Positive  results are indicative of active infection with SARS-CoV-2.  Clinical  correlation with patient history and other diagnostic information is  necessary to determine patient infection status.  Positive results do  not rule out bacterial infection or co-infection with other viruses. If result is PRESUMPTIVE POSTIVE SARS-CoV-2 nucleic acids MAY BE PRESENT.   A presumptive positive result was obtained on the submitted specimen  and confirmed on repeat testing.  While 2019 novel coronavirus  (SARS-CoV-2) nucleic acids may be present in the submitted sample  additional confirmatory testing may be necessary for epidemiological  and / or clinical management purposes  to differentiate between  SARS-CoV-2 and other Sarbecovirus currently known to infect humans.  If clinically indicated additional testing with an alternate test  methodology [phone]) is advised. The SARS-CoV-2 RNA is generally  detectable in upper and lower respiratory sp ecimens during the acute  phase  of infection. The expected result is Negative. Fact Sheet for Patients:  StrictlyIdeas.no Fact Sheet for Healthcare Providers: BankingDealers.co.za This test is not yet approved or cleared by the [place] FDA and has been authorized for detection and/or diagnosis of SARS-CoV-2 by FDA under an Emergency Use Authorization (EUA).  This EUA will remain in effect (meaning this test can be used) for the duration of the COVID-19 declaration under Section 564(b)(1) of the Act, 21 U.S.C. section 360bbb-3(b)(1), unless the authorization is terminated or revoked sooner. Performed at [facility], [place] [address], [place], [place] [address]   Lactic acid, plasma     Status: None   Collection Time: 06/30/19  8:19 AM  Result  Value Ref Range   Lactic Acid, Venous 1.6 0.5 - 1.9 mmol/L    Comment: Performed at [facility] [address], [place], [place] [address]   Ct Abdomen Pelvis Wo Contrast  Result Date: 06/30/2019 CLINICAL DATA:  Coffee ground emesis.  Black stools. EXAM: CT ABDOMEN AND PELVIS WITHOUT CONTRAST TECHNIQUE: Multidetector CT imaging of the abdomen and pelvis was performed following the standard protocol without IV contrast. COMPARISON:  08/28/2018 FINDINGS: Lower chest: 6.1 x 4.4 cm soft tissue mass is identified in the infrahilar medial right lower lobe. There is some minimal dependent airspace opacity in the lower lobes bilaterally suggesting pneumonia. Imaging of the lung bases is motion degraded, but underlying emphysema suspected. Hepatobiliary: No focal abnormality in the liver on this study without intravenous contrast. Calcified gallstones evident. No intrahepatic or extrahepatic biliary dilation. Pancreas: No focal mass lesion. No dilatation of the main duct. No intraparenchymal cyst. No peripancreatic edema. Spleen: No splenomegaly. No focal mass lesion. Adrenals/Urinary Tract: Left adrenal gland unremarkable. 2.3 cm right adrenal nodule is new in the interval. Marked atrophy noted upper pole right kidney, similar to prior with nonobstructive stone disease evident. 9 mm nonobstructing stone identified in the lower pole left kidney with nonobstructing smaller stone noted in the upper pole left kidney. No evidence for hydroureter. Bladder is nondistended which may account for the circumferential bladder wall thickening. Stomach/Bowel: Stomach is unremarkable. No gastric wall thickening. No evidence of outlet obstruction. Duodenum is normally positioned as is the ligament of Treitz. Proximal small bowel loops are dilated up to 4.7 cm diameter. A transition zone is identified just distal to the ligament of Treitz (axial image 38/3 and coronal image 46/6). Proximal jejunal loops represent the dilated  small bowel. These loops remain dilated and become fluid-filled more distally where a second transition zone is identified in the central pelvis (axial 63/3 and coronal 49/6). Small bowel loops distal to this second transition zone are largely decompressed. Terminal ileum unremarkable. The appendix is not visualized, but there is no edema or inflammation in the region of the cecum. No gross colonic mass. No colonic wall thickening. Vascular/Lymphatic: There is abdominal aortic atherosclerosis without aneurysm. There is no gastrohepatic or hepatoduodenal ligament lymphadenopathy. No intraperitoneal or retroperitoneal lymphadenopathy. No pelvic sidewall lymphadenopathy. Reproductive: Prostate gland not clearly visualized suggesting prostatectomy. Other: No substantial intraperitoneal free fluid. Musculoskeletal: Status post ORIF for left femoral neck fracture. Bones are diffusely demineralized. IMPRESSION: 1. Small bowel obstruction with jejunal loops dilated up to 4.7 cm diameter. 2 separate transition zones are identified, 1 in the proximal jejunum just distal to the ligament of Treitz. The second is in the mid to distal small bowel and located in the central pelvis. Given the presence of 2 transition zones, closed loop obstruction is  a distinct concern. No substantial bowel wall thickening or evidence of pneumatosis. No substantial mesenteric edema, interloop mesenteric fluid, or free fluid in the peritoneal cavity. 2. 6.1 x 4.4 cm soft tissue mass in the medial right lower lobe highly concerning for neoplasm. 3. 2.3 cm right adrenal nodule.  Metastatic disease a concern. 4.  Aortic Atherosclerois (ICD10-170.0) Electronically Signed   By: Misty Stanley M.D.   On: 06/30/2019 06:33   Dg Chest Port 1 View  Result Date: 06/30/2019 CLINICAL DATA:  Initial evaluation for acute shortness of breath. EXAM: PORTABLE CHEST 1 VIEW COMPARISON:  Prior radiograph from 04/28/2019. FINDINGS: Cardiac and mediastinal silhouettes  are stable in size and contour, and remain within normal limits. Lungs mildly hypoinflated. Mild chronic bronchitic changes. No consolidative airspace disease. No edema or effusion. No pneumothorax. No acute osseous finding. IMPRESSION: 1. Mild chronic bronchitic changes, most notable at the lung bases. 2. No other superimposed active cardiopulmonary disease. Electronically Signed   By: Jeannine Boga M.D.   On: 06/30/2019 04:36    Anti-infectives (From admission, onward)   Start     Dose/Rate Route Frequency Ordered Stop   06/30/19 2200  vancomycin (VANCOCIN) IVPB 750 mg/150 ml premix     750 mg 150 mL/hr over 60 Minutes Intravenous Every 24 hours 06/30/19 0554     06/30/19 1800  ceFEPIme (MAXIPIME) 2 g in sodium chloride 0.9 % 100 mL IVPB     2 g 200 mL/hr over 30 Minutes Intravenous Every 12 hours 06/30/19 0554     06/30/19 0345  vancomycin (VANCOCIN) 1,500 mg in sodium chloride 0.9 % 500 mL IVPB     1,500 mg 250 mL/hr over 120 Minutes Intravenous  Once 06/30/19 0338 06/30/19 0743   06/30/19 0330  ceFEPIme (MAXIPIME) 2 g in sodium chloride 0.9 % 100 mL IVPB     2 g 200 mL/hr over 30 Minutes Intravenous  Once 06/30/19 0324 06/30/19 0642   06/30/19 0330  metroNIDAZOLE (FLAGYL) IVPB 500 mg     500 mg 100 mL/hr over 60 Minutes Intravenous  Once 06/30/19 0324 06/30/19 0643   06/30/19 0330  vancomycin (VANCOCIN) IVPB 1000 mg/200 mL premix  Status:  Discontinued     1,000 mg 200 mL/hr over 60 Minutes Intravenous  Once 06/30/19 0324 06/30/19 [id number]       Assessment/Plan 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 Vital signs now stable - Agree with medicine admission for resuscitation. CT scan was reviewed with MD. He does have an SBO but no peritonitis on exam. No role for acute surgical intervention. Will start patient on small bowel protocol with NG tube decompression, gastrograffin administration and delayed abdominal film. Will give 5mg  IV vitamin K to start reversing coumadin in case surgery is needed. Hold coumadin and plavix.  ID - maxipime/vancomycin 9/27 VTE - SCDs FEN - IVF, NPO/NGT Foley - none Follow up - TBD  Brooke  Callie Fielding, [facility] Surgery 06/30/2019, 8:51 AM Pager: [phone] Mon-Thurs 7:00 am-4:30 pm Fri 7:00 am -11:30 AM Sat-Sun 7:00 am-11:30 am

## 2019-06-30 NOTE — ED Notes (Signed)
Attempted to in/out patient with 107fr, catheter size too large due to patient anatomy. Yellowish Discharge also noticed coming from urethra. PA aware will attempt with a PEDS cath.

## 2019-06-30 NOTE — H&P (Addendum)
Date: 06/30/2019               Patient Name:  Luis Lyons MRN: [id number]  DOB: 1941-08-21 Age / Sex: 78 y.o., male   PCP: System, Pcp Not In         Medical Service: Internal Medicine Teaching Service         Attending Physician: Dr. Rebeca Alert, Raynaldo Opitz, MD    First Contact: Dr. Gilford Rile Pager: [phone]  Second Contact: Dr. Sherry Ruffing Pager: [phone]       After Hours (After 5p/  First Contact Pager: [phone]  weekends / holidays): Second Contact Pager: [phone]   Chief Complaint: Vomiting  History of Present Illness:  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 Patient states that last evening, he was going about his normal day when he began to vomit "chocolate" looking blood. The staff described  his vomit as looking like "coffee grounds." Patient states he had multiple bouts of vomiting, and had one bout in the ED. He states that he does have some abdominal pain that is a chronic issue for him. He states that his last bowel movement was right before we entered the room. He denies chest pain, vision changes, headaches, fevers, or myalgias.   During his ED course it was noted on CT scan that he has a small bowel obstruction. Surgery was consulted. It was determined that he had no clinic signs of closed loop obstruction and that he is a poor surgical candidate. An NG tube was placed per SBO protocol.   Meds:  Current Meds  Medication Sig  . acetaminophen (TYLENOL) 325 MG tablet Take 650 mg by mouth 3 (three) times daily.   Marland Kitchen albuterol (PROVENTIL) (2.5 MG/3ML) 0.083% nebulizer solution Take 2.5 mg by nebulization 2 (two) times daily as needed for wheezing or shortness of breath.  Marland Kitchen atorvastatin (LIPITOR) 80 MG tablet Take 1 tablet (80 mg total) by mouth daily at 6 PM.  . carvedilol (COREG) 6.25 MG tablet Take 1 tablet (6.25 mg total) by mouth 2 (two) times daily with a meal.   . chlorhexidine (PERIDEX) 0.12 % solution 15 mLs by Mouth Rinse route 2 (two) times a day.   . clopidogrel (PLAVIX) 75 MG tablet Take 1 tablet (75 mg total) by mouth daily.  . coal tar (NEUTROGENA T-GEL) 0.5 % shampoo Apply 1 application topically 2 (two) times a week. Tuesday and Friday  . dextromethorphan (DELSYM) 30 MG/5ML liquid Take 60 mg by mouth 2 (two) times daily as needed for cough.  . diphenhydrAMINE (BENADRYL) 25 mg capsule Take 25 mg by mouth every 8 (eight) hours as needed for itching.  . docusate sodium (COLACE) 100 MG capsule Take 200 mg by mouth daily.  . fluticasone (FLONASE) 50 MCG/ACT nasal spray Place 1 spray into both nostrils daily.  . furosemide (LASIX) 40 MG tablet Take 1 tablet (40 mg total) by mouth every other day.  . guaifenesin (HUMIBID E) 400 MG TABS tablet Take 400 mg by mouth every 6 (six) hours as needed (cough/congestion).  Marland Kitchen guaifenesin (HUMIBID E) 400 MG TABS tablet Take 400 mg by mouth every 4 (four) hours.  Marland Kitchen ibuprofen (ADVIL,MOTRIN) 200 MG tablet Take 400 mg by mouth every 6 (six) hours as needed for moderate pain.  Marland Kitchen ipratropium-albuterol (DUONEB) 0.5-2.5 (3) MG/3ML SOLN Take 3 mLs by nebulization 4 (four) times daily.   Marland Kitchen  ketoconazole (NIZORAL) 2 % cream Apply 1 application topically 2 (two) times daily. Apply to face twice daily for 4 weeks  . losartan (COZAAR) 25 MG tablet Take 1 tablet (25 mg total) by mouth daily.  . magnesium hydroxide (MILK OF MAGNESIA) 400 MG/5ML suspension Take 30 mLs by mouth daily as needed for mild constipation.  Marland Kitchen nystatin (NYSTATIN) powder Apply 1 Bottle topically every 6 (six) hours as needed (diaper changes).  . ondansetron (ZOFRAN) 4 MG tablet Take 4 mg by mouth every 6 (six) hours as needed for nausea.  Marland Kitchen oxybutynin (DITROPAN-XL) 5 MG 24 hr tablet Take 5 mg by mouth at bedtime.  . phenazopyridine (PYRIDIUM) 200 MG tablet Take 200 mg by mouth 3 (three) times daily as needed for pain.  . polyethylene glycol (MIRALAX /  GLYCOLAX) packet Take 17 g by mouth daily.  . polyvinyl alcohol (LIQUIFILM TEARS) 1.4 % ophthalmic solution Place 1 drop into both eyes 2 (two) times daily.  . Probiotic Product (RISA-BID PROBIOTIC PO) Take 2 tablets by mouth 2 (two) times a day.  . senna (SENOKOT) 8.6 MG tablet Take 2 tablets by mouth at bedtime.  . Simethicone 180 MG CAPS Take 180 mg by mouth 2 (two) times daily as needed (gas).  . sodium fluoride (DENTAGEL) 1.1 % GEL dental gel Place 1 application onto teeth 2 (two) times daily.  Marland Kitchen spironolactone (ALDACTONE) 25 MG tablet Take 1 tablet (25 mg total) by mouth daily.  . vitamin B-12 (CYANOCOBALAMIN) 1000 MCG tablet Take 1,000 mcg by mouth daily.  Marland Kitchen warfarin (COUMADIN) 4 MG tablet Take 4 mg by mouth every evening.     Allergies: Allergies as of 06/30/2019 - Review Complete 06/30/2019  Allergen Reaction Noted  . Bactrim [sulfamethoxazole-trimethoprim] Other (See Comments) 04/21/2019   Past Medical History:  Diagnosis Date  . Abdominal hernia   . Cancer ([place])   . Constipation   . Cough   . Depression   . Dry mouth   . Hard of hearing   . Hypertension   . Muscle weakness   . Stroke ([place])   . Urinary incontinence   . Vitamin D deficiency   . Vomiting   . Wheezing     Family History:  Unable to attain  Social History:  States that he stopped smoking 2 years ago. Not sure how long he had been smoking or how many packs a day he smoked.  - Denies EtOH use. - Denies illicit drug use.   Review of Systems: A complete ROS was negative except as per HPI.  Physical Exam: Blood pressure (!) 117/53, pulse 84, temperature 99.4 F (37.4 C), temperature source Rectal, resp. rate (!) 24, height 6\' 1"  (1.854 m), weight 83.9 kg, SpO2 93 %. Physical Exam Vitals signs and nursing note reviewed.  Constitutional:      Comments: 78 year old male, blood stained gown, cooperative during interview.   HENT:     Head: Normocephalic and atraumatic.  Eyes:     General:         Right eye: No discharge.        Left eye: No discharge.     Conjunctiva/sclera: Conjunctivae normal.     Pupils: Pupils are equal, round, and reactive to light.  Cardiovascular:     Rate and Rhythm: Normal rate and regular rhythm.     Pulses: Normal pulses.     Heart sounds: Normal heart sounds. No murmur. No friction rub. No gallop.   Pulmonary:  Effort: Pulmonary effort is normal.     Breath sounds: Normal breath sounds. No wheezing, rhonchi or rales.  Abdominal:     General: Abdomen is flat. Bowel sounds are normal.     Tenderness: There is no abdominal tenderness. There is no guarding or rebound.  Skin:    General: Skin is warm.  Neurological:     Mental Status: He is alert and oriented to person, place, and time.     EKG: personally reviewed my interpretation is Sinus tachycardia with LBBB with a prolonged QTc interval.   CT Abd and pelvis:  IMPRESSION: 1. Small bowel obstruction with jejunal loops dilated up to 4.7 cm diameter. 2 separate transition zones are identified, 1 in the proximal jejunum just distal to the ligament of Treitz. The second is in the mid to distal small bowel and located in the central pelvis. Given the presence of 2 transition zones, closed loop obstruction is a distinct concern. No substantial bowel wall thickening or evidence of pneumatosis. No substantial mesenteric edema, interloop mesenteric fluid, or free fluid in the peritoneal cavity. 2. 6.1 x 4.4 cm soft tissue mass in the medial right lower lobe highly concerning for neoplasm. 3. 2.3 cm right adrenal nodule.  Metastatic disease a concern. 4.  Aortic Atherosclerois (ICD10-170.0)  Assessment & Plan by Problem: Active Problems:   SBO (small bowel obstruction) [facility]) Mr. Nafi is a 78 year old male with a PMH for SBO 2/2 to adhesions, urinary incontinence, stroke, and depression who presents with an SBO.   Mr. Fragoza has had sudden onset vomiting with CT scan signs showing jejunal loops  dilated up to 4.7 cm in diameter are consistent with SBO. He has two separate zones identified on CT. One in the proximal jejunum, and the second in the mid to distal small bowel and located in the central pelvis. Incidentally, he was also found to have a 6.1 x 4.4 cm soft tissue mass in the medial RLL. His is indicative of an upper GI bleed. GI was consulted, and their advice was that this could be 2/2 to his SBO and could represent a mallory weiss tear and to treat the SBO first. They will think about endoscopy at a later date. While his PMH of previous SBO due to adhesions is the most likely cause of his SBO, he has a PMH of squamous cell carcinoma of the mouth, his SBO could be due to malignancy.   Plan:   SBO:  - Surgery consulted. It was determined that he was a poor surgical candidate and an NG tube was placed. We appreciate their assistance.  - GI: Consulted and determined that investigation with endoscopy should be held until resolution of SBO. We appreciate their assistance.  - NG placed for decompression.  - IV protonix - Lactic Acid 1.6 - Metronidazole IVPB 500 mg at 100 mL/hr - Cefepime 2 g, IV 200 mL/hr - Vancomycin IV - 2.5 mL LR bolus given in ED.  - 0.9% NaCl infusion 77ml/hr ordered - Diet: NPO  - CMP ordered for AM  - Continue pulse oximetry and monitor vitals.  - Urinalysis and culture ordered - Blood cultures ordered.     Dispo: Admit patient to Inpatient with expected length of stay greater than 2 midnights.  Signed: Maudie Mercury, MD 06/30/2019, 6:01 PM  Pager: [phone]

## 2019-06-30 NOTE — ED Triage Notes (Signed)
Pt bib by ems from facility due to an episode of coffee ground emesis and "possible" black tarry stools. On Plavix and recently started on coumadin. Pt arrives alert hr in 130's.

## 2019-06-30 NOTE — Progress Notes (Signed)
Patient's daughter [name] called and wanted an update on her father's status since the ED had not called her to update her about what unit her father was admitted to. Patient gave permission for this RN to talk to his daughter [name] about his hospitalization. Daughter updated on reason for patient's admission and that his vital signs are stable and that her father has been resting. Coe,MD with [facility] notified and given the patient's daughter phone number for her to talk to him about her dad and the plan of care. Coe,MD informed this RN that he would call her later this evening. This RN relayed this message to the patient's daughter. The patient's daughter said that if the doctors had any questions about her father's past medical history, they could call her at any time with any questions. This message was also relayed to the MD. Will continue to monitor and treat per MD orders.

## 2019-07-01 ENCOUNTER — Inpatient Hospital Stay (HOSPITAL_COMMUNITY): Payer: Medicare Other

## 2019-07-01 DIAGNOSIS — Z8673 Personal history of transient ischemic attack (TIA), and cerebral infarction without residual deficits: Secondary | ICD-10-CM

## 2019-07-01 DIAGNOSIS — K922 Gastrointestinal hemorrhage, unspecified: Secondary | ICD-10-CM

## 2019-07-01 DIAGNOSIS — D62 Acute posthemorrhagic anemia: Secondary | ICD-10-CM | POA: Diagnosis present

## 2019-07-01 DIAGNOSIS — Z85818 Personal history of malignant neoplasm of other sites of lip, oral cavity, and pharynx: Secondary | ICD-10-CM

## 2019-07-01 DIAGNOSIS — N179 Acute kidney failure, unspecified: Secondary | ICD-10-CM | POA: Diagnosis present

## 2019-07-01 DIAGNOSIS — K56609 Unspecified intestinal obstruction, unspecified as to partial versus complete obstruction: Secondary | ICD-10-CM

## 2019-07-01 LAB — CBC
HCT: 27.5 % — ABNORMAL LOW (ref 39.0–52.0)
Hemoglobin: 8.9 g/dL — ABNORMAL LOW (ref 13.0–17.0)
MCH: 28.7 pg (ref 26.0–34.0)
MCHC: 32.4 g/dL (ref 30.0–36.0)
MCV: 88.7 fL (ref 80.0–100.0)
Platelets: 397 10*3/uL (ref 150–400)
RBC: 3.1 MIL/uL — ABNORMAL LOW (ref 4.22–5.81)
RDW: 16.1 % — ABNORMAL HIGH (ref 11.5–15.5)
WBC: 16.3 10*3/uL — ABNORMAL HIGH (ref 4.0–10.5)
nRBC: 0 % (ref 0.0–0.2)

## 2019-07-01 LAB — COMPREHENSIVE METABOLIC PANEL
ALT: 11 U/L (ref 0–44)
AST: 13 U/L — ABNORMAL LOW (ref 15–41)
Albumin: 2.2 g/dL — ABNORMAL LOW (ref 3.5–5.0)
Alkaline Phosphatase: 60 U/L (ref 38–126)
Anion gap: 10 (ref 5–15)
BUN: 30 mg/dL — ABNORMAL HIGH (ref 8–23)
CO2: 23 mmol/L (ref 22–32)
Calcium: 9.2 mg/dL (ref 8.9–10.3)
Chloride: 108 mmol/L (ref 98–111)
Creatinine, Ser: 1.34 mg/dL — ABNORMAL HIGH (ref 0.61–1.24)
GFR calc Af Amer: 59 mL/min — ABNORMAL LOW (ref >60)
GFR calc non Af Amer: 51 mL/min — ABNORMAL LOW (ref >60)
Glucose, Bld: 110 mg/dL — ABNORMAL HIGH (ref 70–99)
Potassium: 4.3 mmol/L (ref 3.5–5.1)
Sodium: 141 mmol/L (ref 135–145)
Total Bilirubin: 0.8 mg/dL (ref 0.3–1.2)
Total Protein: 6.9 g/dL (ref 6.5–8.1)

## 2019-07-01 LAB — HEMOGLOBIN AND HEMATOCRIT, BLOOD
HCT: 28.4 % — ABNORMAL LOW (ref 39.0–52.0)
Hemoglobin: 8.9 g/dL — ABNORMAL LOW (ref 13.0–17.0)

## 2019-07-01 LAB — PROTIME-INR
INR: 1.3 — ABNORMAL HIGH (ref 0.8–1.2)
Prothrombin Time: 15.9 seconds — ABNORMAL HIGH (ref 11.4–15.2)

## 2019-07-01 NOTE — Progress Notes (Signed)
No Bowel Movement during day shift [phone] on 07/01/2019.  Also no Nausea and no Vomitting with NGT clamped since 0945.  Will pass on to night shift.

## 2019-07-01 NOTE — Progress Notes (Signed)
  Date: 07/01/2019  Patient name: Luis Lyons  Medical record number: [id number]  Date of birth: March 12, 1941   I have seen and evaluated this patient and I have discussed the plan of care with the house staff. Please see their note for complete details. I concur with their findings with the following additions/corrections:   78 yo man with a history of SCC of the mouth, CVA, and multiple abdominal surgeries admitted for SBO with coffee ground emesis and AKI. CT shows SBO, but delayed abd XR shows contrast progression, suggesting partial SBO. Appreciate surgery and GI consult, plan continued NG tube today, clamped per GI, may consider attempting to advance diet tomorrow.   Upper GI bleed possibly due to Mallory-Weiss tear, Hgb down to 8.9 from baseline 11, but stable on repeat this afternoon at 8.9. Will recheck in am, GI recommends no EGD for now unless destabilizing bleed. We will continue to hold warfarin.   AKI has improved after IV fluids, will continue to support with IV fluids until able to stay hydrated on his own.   Lenice Pressman, M.D., Ph.D. 07/01/2019, 5:43 PM

## 2019-07-01 NOTE — Progress Notes (Signed)
CSW notes patient resides at [facility]) SNF under long term care. CSW will continue to follow for discharge planning needs.   Luis Locus Alexandera Kuntzman LCSW [phone]

## 2019-07-01 NOTE — Progress Notes (Signed)
   Subjective:  [name] was seen at bedside. Patient reports that he is still having some abdominal pain that is worsening. He reports that he has had a bowel movement. He reports that he is hungry, hasn't eaten for 2 days. We discussed not proceeding with a diet as it could aggravate his SBO. He was agreeable. All questions and concerns were addressed.   Objective:  Vital signs in last 24 hours: Vitals:   07/01/19 0527 07/01/19 0528 07/01/19 0727 07/01/19 1141  BP: 126/73     Pulse: 74 72    Resp: (!) 22 (!) 22    Temp: 98.4 F (36.9 C)  98.6 F (37 C) 98.9 F (37.2 C)  TempSrc: Axillary  Axillary Axillary  SpO2: 100% 98%    Weight:      Height:       Physical Exam Constitutional:      Comments: Elderly male, cooperative, no distress.   HENT:     Head: Normocephalic and atraumatic.  Cardiovascular:     Rate and Rhythm: Normal rate and regular rhythm.     Pulses: Normal pulses.     Heart sounds: Normal heart sounds. No murmur. No friction rub. No gallop.   Pulmonary:     Effort: Pulmonary effort is normal.     Breath sounds: Normal breath sounds. No wheezing, rhonchi or rales.  Abdominal:     General: There is distension.     Tenderness: There is abdominal tenderness.     Comments: Bowel sounds diminished to auscultation.   Musculoskeletal:     Right lower leg: No edema.     Left lower leg: No edema.  Neurological:     Mental Status: He is alert.     Assessment/Plan:  Active Problems:   SBO (small bowel obstruction) (HCC)   Pressure injury of skin  SBO:  - Surgery: Repeat abdominal filming and continue to keep NPO/NGT and await return in bowel function. We appreciate their assistance with Luis Lyons.  - GI: have clamped the NGT. No plans for endoscopy in absence of significant destabilizing GI bleed. If no nausea/vomiting and patient has bowel function they would consider sips of clear liquid today. We appreciate their assistance with Luis Lyons.  - Follow up HxH  this afternoon.   - NG placed for decompression.  - Continue IV Protonix. - Metronidazole IVPB 500 mg at 100 mL/hr. - Cefepime 2 g, IV 200 mL/hr. - Vancomycin IV 40 mg.  - 0.9% NaCl infusion 18ml/hr ordered. - Diet: NPO. - CMP: Electrolytes are stable.   - Continue pulse oximetry and monitor vitals.  - Urinalysis: Pending. - Blood cultures: NGTD (Day 1) - Prothrombin Time/INR: 15.9/1.3  AKI:  - Admitted with a creatinine of 2.26 likely 2/2 to hypovolemia due to multiple bouts of bloody emesis.  - Creatinine: 1.34   Dispo: Anticipated discharge pending medical course.   Maudie Mercury, MD 07/01/2019, 1:15 PM Pager: [phone]

## 2019-07-01 NOTE — Progress Notes (Addendum)
Pharmacy Antibiotic Note  Luis Lyons is a 78 y.o. male admitted on 06/30/2019 with SBO WBC 16.3, afebrile  Plan: Cefepime 2 grams iv Q 12 hours Consider stopping Vancomycin   Height: 6\' 1"  (185.4 cm) Weight: 185 lb (83.9 kg) IBW/kg (Calculated) : 79.9  Temp (24hrs), Avg:98.5 F (36.9 C), Min:98 F (36.7 C), Max:99.1 F (37.3 C)  Recent Labs  Lab 06/30/19 0223 06/30/19 0227 06/30/19 0819 07/01/19 0638  WBC 40.5*  --   --  16.3*  CREATININE 2.26*  --   --  1.34*  LATICACIDVEN  --  2.2* 1.6  --     Estimated Creatinine Clearance: 52.2 mL/min (A) (by C-G formula based on SCr of 1.34 mg/dL (H)).    Allergies  Allergen Reactions  . Bactrim [Sulfamethoxazole-Trimethoprim] Other (See Comments)    Listed on [facility]     Thank you for allowing pharmacy to be a part of this patient's care. Anette Guarneri, PharmD [phone] 07/01/2019 7:56 AM

## 2019-07-01 NOTE — Progress Notes (Signed)
MD Advised me to keep NGT clamped as long as patient does not show signs of N&V.  Do not give patient clears or anything by mouth.  If patients complains of nausea or starts Vomiting please put NGT back on low intermittent suction.

## 2019-07-01 NOTE — Progress Notes (Signed)
[facility] Progress Note     Subjective: CC-  Patient comfortable this morning. Abdominal pain improving. Denies n/v. No flatus. BM recorded but patient does not remember and RN cannot tell me if this is accurate. No accurate NG tube output recorded. Xray last night showed contrast in right colon but persistently dilated loops of small bowel.  Objective: Vital signs in last 24 hours: Temp:  [98 F (36.7 C)-99.1 F (37.3 C)] 98.6 F (37 C) (09/28 0727) Pulse Rate:  [66-90] 72 (09/28 0528) Resp:  [18-28] 22 (09/28 0528) BP: (105-151)/(43-101) 126/73 (09/28 0527) SpO2:  [93 %-100 %] 98 % (09/28 0528) Last BM Date: 06/30/19  Intake/Output from previous day: 09/27 0701 - 09/28 0700 In: 1600 [I.V.:900; IV Piggyback:700] Out: 350 [Urine:350] Intake/Output this shift: No intake/output data recorded.  PE: Gen:  Alert, NAD, pleasant HEENT: EOM's intact, pupils equal and round. Drainage in NG tubing appears more green now Pulm:  Rate and effort normal Abd: Soft, ND, minimal lower abdominal TTP without rebound or guarding, +BS Skin: warm and dry  Lab Results:  Recent Labs    06/30/19 0223 07/01/19 0638  WBC 40.5* 16.3*  HGB 12.6* 8.9*  HCT 38.4* 27.5*  PLT 631* 397   BMET Recent Labs    06/30/19 0223 07/01/19 0638  NA 133* 141  K 5.2* 4.3  CL 97* 108  CO2 21* 23  GLUCOSE 183* 110*  BUN 30* 30*  CREATININE 2.26* 1.34*  CALCIUM 9.8 9.2   PT/INR Recent Labs    06/30/19 0223  LABPROT 33.7*  INR 3.4*   CMP     Component Value Date/Time   NA 141 07/01/2019 0638   K 4.3 07/01/2019 0638   CL 108 07/01/2019 0638   CO2 23 07/01/2019 0638   GLUCOSE 110 (H) 07/01/2019 0638   BUN 30 (H) 07/01/2019 0638   CREATININE 1.34 (H) 07/01/2019 0638   CALCIUM 9.2 07/01/2019 0638   PROT 6.9 07/01/2019 0638   ALBUMIN 2.2 (L) 07/01/2019 0638   AST 13 (L) 07/01/2019 0638   ALT 11 07/01/2019 0638   ALKPHOS 60 07/01/2019 0638   BILITOT 0.8 07/01/2019 0638    GFRNONAA 51 (L) 07/01/2019 0638   GFRAA 59 (L) 07/01/2019 0638   Lipase     Component Value Date/Time   LIPASE 25 08/28/2018 2142       Studies/Results: Ct Abdomen Pelvis Wo Contrast  Result Date: 06/30/2019 CLINICAL DATA:  Coffee ground emesis.  Black stools. EXAM: CT ABDOMEN AND PELVIS WITHOUT CONTRAST TECHNIQUE: Multidetector CT imaging of the abdomen and pelvis was performed following the standard protocol without IV contrast. COMPARISON:  08/28/2018 FINDINGS: Lower chest: 6.1 x 4.4 cm soft tissue mass is identified in the infrahilar medial right lower lobe. There is some minimal dependent airspace opacity in the lower lobes bilaterally suggesting pneumonia. Imaging of the lung bases is motion degraded, but underlying emphysema suspected. Hepatobiliary: No focal abnormality in the liver on this study without intravenous contrast. Calcified gallstones evident. No intrahepatic or extrahepatic biliary dilation. Pancreas: No focal mass lesion. No dilatation of the main duct. No intraparenchymal cyst. No peripancreatic edema. Spleen: No splenomegaly. No focal mass lesion. Adrenals/Urinary Tract: Left adrenal gland unremarkable. 2.3 cm right adrenal nodule is new in the interval. Marked atrophy noted upper pole right kidney, similar to prior with nonobstructive stone disease evident. 9 mm nonobstructing stone identified in the lower pole left kidney with nonobstructing smaller stone noted in the upper pole left kidney. No  evidence for hydroureter. Bladder is nondistended which may account for the circumferential bladder wall thickening. Stomach/Bowel: Stomach is unremarkable. No gastric wall thickening. No evidence of outlet obstruction. Duodenum is normally positioned as is the ligament of Treitz. Proximal small bowel loops are dilated up to 4.7 cm diameter. A transition zone is identified just distal to the ligament of Treitz (axial image 38/3 and coronal image 46/6). Proximal jejunal loops represent  the dilated small bowel. These loops remain dilated and become fluid-filled more distally where a second transition zone is identified in the central pelvis (axial 63/3 and coronal 49/6). Small bowel loops distal to this second transition zone are largely decompressed. Terminal ileum unremarkable. The appendix is not visualized, but there is no edema or inflammation in the region of the cecum. No gross colonic mass. No colonic wall thickening. Vascular/Lymphatic: There is abdominal aortic atherosclerosis without aneurysm. There is no gastrohepatic or hepatoduodenal ligament lymphadenopathy. No intraperitoneal or retroperitoneal lymphadenopathy. No pelvic sidewall lymphadenopathy. Reproductive: Prostate gland not clearly visualized suggesting prostatectomy. Other: No substantial intraperitoneal free fluid. Musculoskeletal: Status post ORIF for left femoral neck fracture. Bones are diffusely demineralized. IMPRESSION: 1. Small bowel obstruction with jejunal loops dilated up to 4.7 cm diameter. 2 separate transition zones are identified, 1 in the proximal jejunum just distal to the ligament of Treitz. The second is in the mid to distal small bowel and located in the central pelvis. Given the presence of 2 transition zones, closed loop obstruction is a distinct concern. No substantial bowel wall thickening or evidence of pneumatosis. No substantial mesenteric edema, interloop mesenteric fluid, or free fluid in the peritoneal cavity. 2. 6.1 x 4.4 cm soft tissue mass in the medial right lower lobe highly concerning for neoplasm. 3. 2.3 cm right adrenal nodule.  Metastatic disease a concern. 4.  Aortic Atherosclerois (ICD10-170.0) Electronically Signed   By: Misty Stanley M.D.   On: 06/30/2019 06:33   Dg Chest Port 1 View  Result Date: 06/30/2019 CLINICAL DATA:  Initial evaluation for acute shortness of breath. EXAM: PORTABLE CHEST 1 VIEW COMPARISON:  Prior radiograph from 04/28/2019. FINDINGS: Cardiac and mediastinal  silhouettes are stable in size and contour, and remain within normal limits. Lungs mildly hypoinflated. Mild chronic bronchitic changes. No consolidative airspace disease. No edema or effusion. No pneumothorax. No acute osseous finding. IMPRESSION: 1. Mild chronic bronchitic changes, most notable at the lung bases. 2. No other superimposed active cardiopulmonary disease. Electronically Signed   By: Jeannine Boga M.D.   On: 06/30/2019 04:36   Dg Abd Portable 1v-small Bowel Obstruction Protocol-initial, 8 Hr Delay  Result Date: 06/30/2019 CLINICAL DATA:  8 hour delay film.  Small bowel obstruction. EXAM: PORTABLE ABDOMEN - 1 VIEW COMPARISON:  June 30, 2019 FINDINGS: An NG tube terminates with the side port just below the GE junction in the distal tip in the fundus. Dilated loops of small bowel are again identified, out of proportion to colonic caliber. There is contrast in the right side of the colon. There also appears to be contrast within pelvic loops of small bowel which are nondilated. IMPRESSION: 1. Persistent small bowel obstruction. The oral contrast is now seen in more distal nondilated loops of small bowel and the right side of the colon consistent with partial obstruction. 2. The side port of the NG tube is just below the GE junction with the distal tip in the fundus. Electronically Signed   By: Dorise Bullion III M.D   On: 06/30/2019 18:23   Dg Abd  Portable 1v  Result Date: 06/30/2019 CLINICAL DATA:  NG tube placement. Verification of placement of NG Tube EXAM: PORTABLE ABDOMEN - 1 VIEW COMPARISON:  None. FINDINGS: NG tube in stomach. Side port GE junction. Dilated loops of small bowel. IMPRESSION: 1. NG tube in stomach.  Side port GE junction 2. Dilated loops small bowel. Electronically Signed   By: Suzy Bouchard M.D.   On: 06/30/2019 09:12    Anti-infectives: Anti-infectives (From admission, onward)   Start     Dose/Rate Route Frequency Ordered Stop   07/01/19 1900  ceFEPIme  (MAXIPIME) 2 g in sodium chloride 0.9 % 100 mL IVPB     2 g 200 mL/hr over 30 Minutes Intravenous Every 12 hours 06/30/19 1854     06/30/19 2200  vancomycin (VANCOCIN) IVPB 750 mg/150 ml premix     750 mg 150 mL/hr over 60 Minutes Intravenous Every 24 hours 06/30/19 0554     06/30/19 1800  ceFEPIme (MAXIPIME) 2 g in sodium chloride 0.9 % 100 mL IVPB  Status:  Discontinued     2 g 200 mL/hr over 30 Minutes Intravenous Every 12 hours 06/30/19 0554 06/30/19 1854   06/30/19 0345  vancomycin (VANCOCIN) 1,500 mg in sodium chloride 0.9 % 500 mL IVPB     1,500 mg 250 mL/hr over 120 Minutes Intravenous  Once 06/30/19 0338 06/30/19 0743   06/30/19 0330  ceFEPIme (MAXIPIME) 2 g in sodium chloride 0.9 % 100 mL IVPB     2 g 200 mL/hr over 30 Minutes Intravenous  Once 06/30/19 0324 06/30/19 0642   06/30/19 0330  metroNIDAZOLE (FLAGYL) IVPB 500 mg     500 mg 100 mL/hr over 60 Minutes Intravenous  Once 06/30/19 0324 06/30/19 0643   06/30/19 0330  vancomycin (VANCOCIN) IVPB 1000 mg/200 mL premix  Status:  Discontinued     1,000 mg 200 mL/hr over 60 Minutes Intravenous  Once 06/30/19 0324 06/30/19 [id number]       Assessment/Plan 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 GI was called and may consider endoscopy. Continue protonix, repeat H&H pending this afternoon  Sepsis - WBC down trending and lactic acidosis normalized; VSS  SBO - Patient with multiple prior abdominal surgeries including SBO requiring exploratory laparotomy lysis of adhesions 09/2018, prostatectomy, ex lap for ruptured appendicitis - CT scan 9/27 showed SBO with 2 possible transition zones, no bowel wall  thickening or evidence of pneumatosis; reviewed with MD and not concerned for closed loop obstruction  ID - maxipime/vancomycin 9/27>> VTE - SCDs FEN - IVF, NPO/NGT Foley - none Follow up - TBD  Plan: Repeat abdominal film this morning. Continue NPO/NGT for now and await return in bowel function. Continue to hold plavix and coumadin.   LOS: 1 day    Wellington Hampshire , [facility] 07/01/2019, 8:46 AM Pager: [phone] Mon-Thurs 7:00 am-4:30 pm Fri 7:00 am -11:30 AM Sat-Sun 7:00 am-11:30 am

## 2019-07-01 NOTE — Progress Notes (Signed)
Subjective: NGT in place. Denies nausea. Had non-bloody bowel movement yesterday, and passing flatus.  Objective: Vital signs in last 24 hours: Temp:  [98 F (36.7 C)-99.1 F (37.3 C)] 98.6 F (37 C) (09/28 0727) Pulse Rate:  [66-90] 72 (09/28 0528) Resp:  [18-28] 22 (09/28 0528) BP: (105-151)/(43-101) 126/73 (09/28 0527) SpO2:  [93 %-100 %] 98 % (09/28 0528) Weight change:  Last BM Date: 06/30/19  PE: GEN:  NGT in place, scant coffee ground output ABD:  Soft, non-tender, very minimal distention  Lab Results: CBC    Component Value Date/Time   WBC 16.3 (H) 07/01/2019 0638   RBC 3.10 (L) 07/01/2019 0638   HGB 8.9 (L) 07/01/2019 0638   HCT 27.5 (L) 07/01/2019 0638   PLT 397 07/01/2019 0638   MCV 88.7 07/01/2019 0638   MCH 28.7 07/01/2019 0638   MCHC 32.4 07/01/2019 0638   RDW 16.1 (H) 07/01/2019 0638   LYMPHSABS 1.5 04/25/2019 0520   MONOABS 0.6 04/25/2019 0520   EOSABS 0.3 04/25/2019 0520   BASOSABS 0.0 04/25/2019 0520   CMP     Component Value Date/Time   NA 141 07/01/2019 0638   K 4.3 07/01/2019 0638   CL 108 07/01/2019 0638   CO2 23 07/01/2019 0638   GLUCOSE 110 (H) 07/01/2019 0638   BUN 30 (H) 07/01/2019 0638   CREATININE 1.34 (H) 07/01/2019 0638   CALCIUM 9.2 07/01/2019 0638   PROT 6.9 07/01/2019 0638   ALBUMIN 2.2 (L) 07/01/2019 0638   AST 13 (L) 07/01/2019 0638   ALT 11 07/01/2019 0638   ALKPHOS 60 07/01/2019 0638   BILITOT 0.8 07/01/2019 0638   GFRNONAA 51 (L) 07/01/2019 0638   GFRAA 59 (L) 07/01/2019 [id number]    Assessment:  1.  Small bowel obstruction, likely adhesions, improving. 2.  Coffee grounds in NGT, scant, likely either from esophagitis or trauma from tube insertion in setting of anticoagulation. 3.  Stroke, on anticoagulation (currently on hold). 4.  History mouth and prostate cancer. 5.  Myocardial infarction July 2020. 6.  Right lung mass.  Plan:  1.  Clamp NGT. 2.  Sips clear liquids. 3.  PPI. 4.  No plans for endoscopy in  absence of significant destabilizing GI bleeding, in light of multiple comorbidities including recent myocardial infarction. 5.  [facility] will follow.   Luis Lyons 07/01/2019, 9:17 AM   Cell [phone] If no answer or after 5 PM call [phone]

## 2019-07-01 NOTE — Progress Notes (Signed)
Clamped NG Tube at 0945 as per Dr. [facility].  Up until now patient has not vomited or complained of nausea.

## 2019-07-01 NOTE — Plan of Care (Signed)
Read surgery note after my note.  I have clamped NGT and will see how patient does; if no nausea/vomiting and patient has some bowel function, would consider sips clears later today.

## 2019-07-02 LAB — BASIC METABOLIC PANEL
Anion gap: 9 (ref 5–15)
BUN: 19 mg/dL (ref 8–23)
CO2: 25 mmol/L (ref 22–32)
Calcium: 9.3 mg/dL (ref 8.9–10.3)
Chloride: 110 mmol/L (ref 98–111)
Creatinine, Ser: 1.03 mg/dL (ref 0.61–1.24)
GFR calc Af Amer: >60 mL/min (ref >60)
GFR calc non Af Amer: >60 mL/min (ref >60)
Glucose, Bld: 93 mg/dL (ref 70–99)
Potassium: 4.7 mmol/L (ref 3.5–5.1)
Sodium: 144 mmol/L (ref 135–145)

## 2019-07-02 LAB — CBC
HCT: 28.6 % — ABNORMAL LOW (ref 39.0–52.0)
Hemoglobin: 8.7 g/dL — ABNORMAL LOW (ref 13.0–17.0)
MCH: 28 pg (ref 26.0–34.0)
MCHC: 30.4 g/dL (ref 30.0–36.0)
MCV: 92 fL (ref 80.0–100.0)
Platelets: 415 10*3/uL — ABNORMAL HIGH (ref 150–400)
RBC: 3.11 MIL/uL — ABNORMAL LOW (ref 4.22–5.81)
RDW: 15.9 % — ABNORMAL HIGH (ref 11.5–15.5)
WBC: 15.5 10*3/uL — ABNORMAL HIGH (ref 4.0–10.5)
nRBC: 0 % (ref 0.0–0.2)

## 2019-07-02 LAB — URINALYSIS, ROUTINE W REFLEX MICROSCOPIC
Bilirubin Urine: NEGATIVE
Glucose, UA: NEGATIVE mg/dL
Ketones, ur: 5 mg/dL — AB
Nitrite: NEGATIVE
Protein, ur: 30 mg/dL — AB
Specific Gravity, Urine: 1.018 (ref 1.005–1.030)
WBC, UA: >50 WBC/hpf — ABNORMAL HIGH (ref 0–5)
pH: 6 (ref 5.0–8.0)

## 2019-07-02 MED ORDER — ORAL CARE MOUTH RINSE
15.0000 mL | Freq: Two times a day (BID) | OROMUCOSAL | Status: DC
  Administered 2019-07-03 – 2019-07-09 (×12): 15 mL via OROMUCOSAL

## 2019-07-02 MED ORDER — SODIUM CHLORIDE 0.9 % IV SOLN
INTRAVENOUS | Status: DC
  Administered 2019-07-02 – 2019-07-04 (×4): via INTRAVENOUS

## 2019-07-02 MED ORDER — CHLORHEXIDINE GLUCONATE 0.12 % MT SOLN
15.0000 mL | Freq: Two times a day (BID) | OROMUCOSAL | Status: DC
  Administered 2019-07-02 – 2019-07-09 (×14): 15 mL via OROMUCOSAL
  Filled 2019-07-02 (×14): qty 15

## 2019-07-02 MED ORDER — RESOURCE THICKENUP CLEAR PO POWD
ORAL | Status: DC | PRN
  Filled 2019-07-02: qty 125

## 2019-07-02 NOTE — Progress Notes (Signed)
  Date: 07/02/2019  Patient name: Luis Lyons  Medical record number: [id number]  Date of birth: 17-Sep-1941   I have seen and evaluated this patient and I have discussed the plan of care with the house staff. Please see their note for complete details. I concur with their findings with the following additions/corrections:   Doing better, NG tube clamped with no vomiting. Appreciate GI and surgery following, plan for trying clears today (nectar thick per feeding plan prior to admission).  Leukocytosis of unclear origin has improved, continue on broad antibiotics pending culture results.   Lenice Pressman, M.D., Ph.D. 07/02/2019, 2:37 PM

## 2019-07-02 NOTE — Progress Notes (Signed)
Subjective: No nausea with NGT clamped. No hematemesis or blood in stool. Minimal abdominal pain. Is passing flatus; no bowel movement yesterday.  Objective: Vital signs in last 24 hours: Temp:  [98.4 F (36.9 C)-98.9 F (37.2 C)] 98.8 F (37.1 C) (09/29 0301) Pulse Rate:  [67-88] 72 (09/29 0500) Resp:  [14-26] 20 (09/29 0500) BP: (123-151)/(53-93) 151/74 (09/29 0301) SpO2:  [96 %-100 %] 99 % (09/29 0500) Weight:  [76.4 kg] 76.4 kg (09/29 0301) Weight change:  Last BM Date: 06/30/19  PE: GEN:  NGT clamped, NAD ABD:  Soft, mild distention + mild tympany, non tender  Lab Results: CBC    Component Value Date/Time   WBC 15.5 (H) 07/02/2019 0757   RBC 3.11 (L) 07/02/2019 0757   HGB 8.7 (L) 07/02/2019 0757   HCT 28.6 (L) 07/02/2019 0757   PLT 415 (H) 07/02/2019 0757   MCV 92.0 07/02/2019 0757   MCH 28.0 07/02/2019 0757   MCHC 30.4 07/02/2019 0757   RDW 15.9 (H) 07/02/2019 0757   LYMPHSABS 1.5 04/25/2019 0520   MONOABS 0.6 04/25/2019 0520   EOSABS 0.3 04/25/2019 0520   BASOSABS 0.0 04/25/2019 0520   CMP     Component Value Date/Time   NA 144 07/02/2019 0757   K 4.7 07/02/2019 0757   CL 110 07/02/2019 0757   CO2 25 07/02/2019 0757   GLUCOSE 93 07/02/2019 0757   BUN 19 07/02/2019 0757   CREATININE 1.03 07/02/2019 0757   CALCIUM 9.3 07/02/2019 0757   PROT 6.9 07/01/2019 0638   ALBUMIN 2.2 (L) 07/01/2019 0638   AST 13 (L) 07/01/2019 0638   ALT 11 07/01/2019 0638   ALKPHOS 60 07/01/2019 0638   BILITOT 0.8 07/01/2019 0638   GFRNONAA >60 07/02/2019 0757   GFRAA >60 07/02/2019 0757   Assessment:  1.  Small bowel obstruction, likely adhesions, improving. 2.  Coffee grounds in NGT, scant, likely either from esophagitis or trauma from tube insertion in setting of anticoagulation. 3.  Stroke, on anticoagulation (currently on hold). 4.  History mouth and prostate cancer. 5.  Myocardial infarction July 2020. 6.  Right lung mass.  Plan:  1.  Sips clears today. 2.   PPI. 3.  Supportive care with IVF, antiemetics as needed. 4.  [facility] will follow.   Landry Dyke 07/02/2019, 10:01 AM   Cell [phone] If no answer or after 5 PM call [phone]

## 2019-07-02 NOTE — Progress Notes (Signed)
Pulled NGT at 4pm.  Pt tolerated well.  Brooke from Surgery asked me to pull tube and order a full liquid diet.  I let her know patient is an aspiration risk and looked like he was having a hard time with the thick liquids.  She was in agreement that pt should have a speech swallow evaluation.  She told me to go ahead and order one.

## 2019-07-02 NOTE — Progress Notes (Signed)
[facility] Progress Note     Subjective: CC-  Feeling better each day. Abdomen a little sore but he denies any pain. Denies n/v. NG tube has been clamped since yesterday morning. He has had nothing to drink. Passing flatus, no BM yesterday.  Objective: Vital signs in last 24 hours: Temp:  [98.4 F (36.9 C)-98.9 F (37.2 C)] 98.6 F (37 C) (09/29 0827) Pulse Rate:  [67-88] 72 (09/29 0500) Resp:  [14-26] 20 (09/29 0500) BP: (123-151)/(53-93) 151/74 (09/29 0301) SpO2:  [96 %-100 %] 99 % (09/29 0500) Weight:  [76.4 kg] 76.4 kg (09/29 0301) Last BM Date: 06/30/19  Intake/Output from previous day: 09/28 0701 - 09/29 0700 In: 250.1 [IV Piggyback:250.1] Out: 20 [Emesis/NG output:20] Intake/Output this shift: No intake/output data recorded.  PE: Gen:  Alert, NAD, pleasant HEENT: EOM's intact, pupils equal and round Pulm:  Rate and effort normal Abd: Soft, ND, mild subjective lower abdominal TTP without rebound or guarding, +BS Skin: warm and dry   Lab Results:  Recent Labs    07/01/19 0638 07/01/19 1245 07/02/19 0757  WBC 16.3*  --  15.5*  HGB 8.9* 8.9* 8.7*  HCT 27.5* 28.4* 28.6*  PLT 397  --  415*   BMET Recent Labs    07/01/19 0638 07/02/19 0757  NA 141 144  K 4.3 4.7  CL 108 110  CO2 23 25  GLUCOSE 110* 93  BUN 30* 19  CREATININE 1.34* 1.03  CALCIUM 9.2 9.3   PT/INR Recent Labs    06/30/19 0223 07/01/19 0936  LABPROT 33.7* 15.9*  INR 3.4* 1.3*   CMP     Component Value Date/Time   NA 144 07/02/2019 0757   K 4.7 07/02/2019 0757   CL 110 07/02/2019 0757   CO2 25 07/02/2019 0757   GLUCOSE 93 07/02/2019 0757   BUN 19 07/02/2019 0757   CREATININE 1.03 07/02/2019 0757   CALCIUM 9.3 07/02/2019 0757   PROT 6.9 07/01/2019 0638   ALBUMIN 2.2 (L) 07/01/2019 0638   AST 13 (L) 07/01/2019 0638   ALT 11 07/01/2019 0638   ALKPHOS 60 07/01/2019 0638   BILITOT 0.8 07/01/2019 0638   GFRNONAA >60 07/02/2019 0757   GFRAA >60 07/02/2019 0757    Lipase     Component Value Date/Time   LIPASE 25 08/28/2018 2142       Studies/Results: Dg Abd Portable 1v  Result Date: 07/01/2019 CLINICAL DATA:  Follow up small bowel obstruction EXAM: PORTABLE ABDOMEN - 1 VIEW COMPARISON:  06/30/2019 FINDINGS: Previously administered contrast now lies throughout the colon consistent with a partial small bowel obstruction. Persistent proximal small bowel dilatation remains. No free air is noted. IMPRESSION: Stable changes of partial small bowel obstruction. Electronically Signed   By: Inez Catalina M.D.   On: 07/01/2019 10:10   Dg Abd Portable 1v-small Bowel Obstruction Protocol-initial, 8 Hr Delay  Result Date: 06/30/2019 CLINICAL DATA:  8 hour delay film.  Small bowel obstruction. EXAM: PORTABLE ABDOMEN - 1 VIEW COMPARISON:  June 30, 2019 FINDINGS: An NG tube terminates with the side port just below the GE junction in the distal tip in the fundus. Dilated loops of small bowel are again identified, out of proportion to colonic caliber. There is contrast in the right side of the colon. There also appears to be contrast within pelvic loops of small bowel which are nondilated. IMPRESSION: 1. Persistent small bowel obstruction. The oral contrast is now seen in more distal nondilated loops of small bowel and the  right side of the colon consistent with partial obstruction. 2. The side port of the NG tube is just below the GE junction with the distal tip in the fundus. Electronically Signed   By: Dorise Bullion III M.D   On: 06/30/2019 18:23    Anti-infectives: Anti-infectives (From admission, onward)   Start     Dose/Rate Route Frequency Ordered Stop   07/01/19 1900  ceFEPIme (MAXIPIME) 2 g in sodium chloride 0.9 % 100 mL IVPB     2 g 200 mL/hr over 30 Minutes Intravenous Every 12 hours 06/30/19 1854     06/30/19 2200  vancomycin (VANCOCIN) IVPB 750 mg/150 ml premix     750 mg 150 mL/hr over 60 Minutes Intravenous Every 24 hours 06/30/19 0554      06/30/19 1800  ceFEPIme (MAXIPIME) 2 g in sodium chloride 0.9 % 100 mL IVPB  Status:  Discontinued     2 g 200 mL/hr over 30 Minutes Intravenous Every 12 hours 06/30/19 0554 06/30/19 1854   06/30/19 0345  vancomycin (VANCOCIN) 1,500 mg in sodium chloride 0.9 % 500 mL IVPB     1,500 mg 250 mL/hr over 120 Minutes Intravenous  Once 06/30/19 0338 06/30/19 0743   06/30/19 0330  ceFEPIme (MAXIPIME) 2 g in sodium chloride 0.9 % 100 mL IVPB     2 g 200 mL/hr over 30 Minutes Intravenous  Once 06/30/19 0324 06/30/19 0642   06/30/19 0330  metroNIDAZOLE (FLAGYL) IVPB 500 mg     500 mg 100 mL/hr over 60 Minutes Intravenous  Once 06/30/19 0324 06/30/19 0643   06/30/19 0330  vancomycin (VANCOCIN) IVPB 1000 mg/200 mL premix  Status:  Discontinued     1,000 mg 200 mL/hr over 60 Minutes Intravenous  Once 06/30/19 0324 06/30/19 [id number]       Assessment/Plan 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 on protonix, GI following  Sepsis - WBC down trending and lactic acidosis normalized; VSS  SBO - Patient with multiple prior abdominal surgeries including SBO requiring exploratory laparotomy lysis of adhesions 09/2018, prostatectomy, ex lap for ruptured appendicitis - CT scan 9/27 showed SBO with 2 possible transition zones, no bowel wall thickening or evidence of pneumatosis; reviewed with MD and not concerned for closed loop obstruction  ID -maxipime/vancomycin 9/27>> VTE -SCDs FEN -IVF, NG tube clamped, CLD Foley -none Follow up -TBD  Plan: Attempt sips of clear liquids today. If he is tolerating this may be able to d/c NG tube later today.   LOS: 2 days     Wellington Hampshire , [facility] 07/02/2019, 11:00 AM Pager: [phone] Mon-Thurs 7:00 am-4:30 pm Fri 7:00 am -11:30 AM Sat-Sun 7:00 am-11:30 am

## 2019-07-02 NOTE — Progress Notes (Signed)
Got report from night nurse.  Pt did not have a bowel movement on night shift 9/28-9/29, 1900-0700.  Also no complaints of Nausea no vomiting on that shift as well.  Pt has been asking for ice chips or water.

## 2019-07-02 NOTE — Progress Notes (Signed)
   Subjective:  [name] was seen at bedside. He states that he slept well overnight. He has had flatus, but does not endorse bowel movements. He is on a nectar thickened fluids diet, and he is agreeable to moving forward with his diet. All questions and concerns were addressed.    Objective:  Vital signs in last 24 hours: Vitals:   07/02/19 0323 07/02/19 0500 07/02/19 0827 07/02/19 1216  BP:      Pulse: 73 72    Resp: (!) 24 20    Temp:   98.6 F (37 C) 98.9 F (37.2 C)  TempSrc:   Oral Oral  SpO2: 97% 99%    Weight:      Height:       Physical Exam Vitals signs and nursing note reviewed.  Constitutional:      Appearance: Normal appearance.     Comments: Elderly male, sitting comfortably in bed. No acute distress.  HENT:     Head: Normocephalic and atraumatic.  Cardiovascular:     Rate and Rhythm: Normal rate and regular rhythm.     Pulses: Normal pulses.     Heart sounds: Normal heart sounds. No murmur. No friction rub. No gallop.   Pulmonary:     Effort: Pulmonary effort is normal.     Breath sounds: Normal breath sounds. No wheezing, rhonchi or rales.  Abdominal:     Comments: Bowel sounds diminished to auscultation. Mild tenderness on palpation   Neurological:     Mental Status: He is alert.    Assessment/Plan:  Principal Problem:   SBO (small bowel obstruction) (HCC) Active Problems:   Pressure injury of skin   AKI (acute kidney injury) ([place])   Acute blood loss anemia   Upper GI bleed  SBO:  - Surgery: Repeat abdominal filming and continue to keep NPO/NGT and await return in bowel function. We appreciate their assistance with [name].  - GI: have clamped the NGT. No plans for endoscopy in absence of significant destabilizing GI bleed. If no nausea/vomiting and patient has bowel function they would consider sips of clear liquid today. We appreciate their assistance with [name].  - NGT clamped  - Continue IV Protonix. - Cefepime 2 g, IV 200 mL/hr. -  Vancomycin IV 40 mg.  - 0.9% NaCl infusion 75ml/hr ordered. - Diet: NPO. - CMP: Electrolytes are stable.   - Continue pulse oximetry and monitor vitals.  - Urinalysis: Pending. - Blood cultures: NGTD (Day 1)   AKI: Resolved - Creatinine: 1.03 Dispo: Anticipated discharge pending medical course.   Maudie Mercury, MD 07/02/2019, 12:38 PM Pager: [phone]

## 2019-07-03 DIAGNOSIS — R8281 Pyuria: Secondary | ICD-10-CM

## 2019-07-03 LAB — URINE CULTURE: Culture: 30000 — AB

## 2019-07-03 NOTE — Progress Notes (Signed)
Subjective: No abdominal pain. No nausea/vomiting; tolerating full liquids No GI bleeding. Had large bowel movement yesterday.  Objective: Vital signs in last 24 hours: Temp:  [98.4 F (36.9 C)-98.9 F (37.2 C)] 98.9 F (37.2 C) (09/30 0341) Pulse Rate:  [55-86] 72 (09/30 0341) Resp:  [21-26] 23 (09/30 0341) BP: (136-157)/(53-86) 154/60 (09/30 0341) SpO2:  [91 %-100 %] 96 % (09/30 0341) Weight:  [78.4 kg] 78.4 kg (09/30 0341) Weight change: 2 kg Last BM Date: 06/30/19  PE: GEN:  NAD, NGT removed ABD:  Soft, small reducible periumbilical hernia, mild distention, non-tender  Lab Results: CBC    Component Value Date/Time   WBC 15.5 (H) 07/02/2019 0757   RBC 3.11 (L) 07/02/2019 0757   HGB 8.7 (L) 07/02/2019 0757   HCT 28.6 (L) 07/02/2019 0757   PLT 415 (H) 07/02/2019 0757   MCV 92.0 07/02/2019 0757   MCH 28.0 07/02/2019 0757   MCHC 30.4 07/02/2019 0757   RDW 15.9 (H) 07/02/2019 0757   LYMPHSABS 1.5 04/25/2019 0520   MONOABS 0.6 04/25/2019 0520   EOSABS 0.3 04/25/2019 0520   BASOSABS 0.0 04/25/2019 0520   CMP     Component Value Date/Time   NA 144 07/02/2019 0757   K 4.7 07/02/2019 0757   CL 110 07/02/2019 0757   CO2 25 07/02/2019 0757   GLUCOSE 93 07/02/2019 0757   BUN 19 07/02/2019 0757   CREATININE 1.03 07/02/2019 0757   CALCIUM 9.3 07/02/2019 0757   PROT 6.9 07/01/2019 0638   ALBUMIN 2.2 (L) 07/01/2019 0638   AST 13 (L) 07/01/2019 0638   ALT 11 07/01/2019 0638   ALKPHOS 60 07/01/2019 0638   BILITOT 0.8 07/01/2019 0638   GFRNONAA >60 07/02/2019 0757   GFRAA >60 07/02/2019 0757   Assessment:  1. Small bowel obstruction, likely adhesions, improving. 2. Coffee grounds in NGT, scant, likely either from esophagitis or trauma from tube insertion in setting of anticoagulation. 3. Stroke, on anticoagulation (currently on hold). 4. History mouth and prostate cancer. 5. Myocardial infarction July 2020. 6. Right lung mass.  Plan:  1.  Speech Therapy  evaluation to help guide advancement of diet. 2.  IV PPI for now, if does well with speech therapy evaluation, could change to po PPI.  Would continue PPI indefinitely. 3.  No plans for endoscopy. 4.  Would hold off on anticoagulation ideally for another couple weeks, if clinically feasible. 5.  [facility] will follow.  Landry Dyke 07/03/2019, 10:17 AM   Cell [phone] If no answer or after 5 PM call [phone]

## 2019-07-03 NOTE — Evaluation (Addendum)
Clinical/Bedside Swallow Evaluation Patient Details  Name: Luis Lyons MRN: [id number] Date of Birth: [birth year]  Today's Date: 07/03/2019 Time: SLP Start Time (ACUTE ONLY): 1023 SLP Stop Time (ACUTE ONLY): [id number] SLP Time Calculation (min) (ACUTE ONLY): 10 min  Past Medical History:  Past Medical History:  Diagnosis Date  . Abdominal hernia   . Cancer ([place])   . Constipation   . Cough   . Depression   . Dry mouth   . Hard of hearing   . Hypertension   . Muscle weakness   . Stroke ([place])   . Urinary incontinence   . Vitamin D deficiency   . Vomiting   . Wheezing    Past Surgical History:  Past Surgical History:  Procedure Laterality Date  . ABDOMINAL SURGERY    . LAPAROTOMY N/A 09/05/2018   Procedure: EXPLORATORY LAPAROTOMY;  Surgeon: Georganna Skeans, MD;  Location: [facility];  Service: General;  Laterality: N/A;   HPI:  Luis Lyons yo man with a history of SCC of the mouth, CVA, and multiple abdominal surgeries admitted for SBO with coffee ground emesis and AKI. CT shows SBO, but delayed abd XR shows contrast progression, suggesting partial SBO. Upper GI bleed possibly due to Mallory-Weiss tear, Hgb down to 8.9 from baseline 11, but stable on repeat this afternoon at 8.9. Will recheck in am, GI recommends no EGD for now unless destabilizing bleed. Tolerating full liquid diet, and medically appropriate to advance diet as tolerated. 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 CXR 9/27 not concerning for consolidated airspace disease.  Assessment / Plan / Recommendation Clinical Impression  Pt presents with mild oral dysphagia and clinical s/s of pharyngeal dysphagia.  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  Pt reports drinking water when he can get it.  Pt did not endorse whether all other liquids are still thickened.  Pt could not  verbalize baseline diet texture.  Pt tolerated nectar thick liquid with no overt s/s of puree. Brief oral hold noted prior to swallow.  With thin liquid, suspect premature spillage and swallow reflex triggered more quickly, but was followed by immediate, prolonged, wet cough.  Suction used to assist in clearance.  Pt tolerated puree solid and simulated ground consistency and exhibited good oral clearance.  With regular solid there was prolonged oral phase with oral residue and cough after swallow.  Recommend ground/chopped diet (dysphagia 2) with nectar thick liquid. SLP Visit Diagnosis: Dysphagia, oropharyngeal phase (R13.12)    Aspiration Risk  Mild aspiration risk;Moderate aspiration risk    Diet Recommendation Dysphagia 2 (Fine chop);Nectar-thick liquid   Liquid Administration via: Cup;Straw Medication Administration: Whole meds with liquid(as tolerated) Supervision: Patient able to self feed Compensations: Slow rate;Small sips/bites Postural Changes: Seated upright at 90 degrees    Other  Recommendations Oral Care Recommendations: Oral care BID Other Recommendations: Order thickener from pharmacy   Follow up Recommendations        Frequency and Duration min 2x/week  2 weeks       Prognosis Prognosis for Safe Diet Advancement: Fair Barriers to Reach Goals: Severity of deficits;Time post onset      Swallow Study   General HPI: Luis Lyons yo man with a history of SCC of the mouth, CVA, and multiple abdominal surgeries admitted for SBO with coffee ground emesis and AKI. CT shows SBO, but  delayed abd XR shows contrast progression, suggesting partial SBO. Upper GI bleed possibly due to Mallory-Weiss tear, Hgb down to 8.9 from baseline 11, but stable on repeat this afternoon at 8.9. Will recheck in am, GI recommends no EGD for now unless destabilizing bleed. Tolerating full liquid diet, and medically appropriate to advance diet as tolerated. Pt has hx of dysphagia s/p oral CA with XRT and  portion of mandible removed, and was seen by this service during prior admission in July for myocardial infarction. Type of Study: Bedside Swallow Evaluation Previous Swallow Assessment: MBS 04/22/19 with recommendations for dysphagia 2 (ground/chopped) and nectar thick liquids. Diet Prior to this Study: Nectar-thick liquids Temperature Spikes Noted: No History of Recent Intubation: No Behavior/Cognition: Alert;Cooperative;Pleasant mood Oral Cavity Assessment: Within Functional Limits Oral Care Completed by SLP: No Oral Cavity - Dentition: Missing dentition;Edentulous Self-Feeding Abilities: Able to feed self Patient Positioning: Upright in bed Baseline Vocal Quality: Normal Volitional Cough: Strong Volitional Swallow: Able to elicit    Oral/Motor/Sensory Function Overall Oral Motor/Sensory Function: Mild impairment Facial Symmetry: Abnormal symmetry right(2/2 mandibulectomy) Lingual ROM: Within Functional Limits Lingual Symmetry: (slight R protrusion) Lingual Strength: Reduced Mandible: Within Functional Limits   Ice Chips Ice chips: Not tested   Thin Liquid Thin Liquid: Impaired Pharyngeal  Phase Impairments: Wet Vocal Quality;Cough - Immediate    Nectar Thick Nectar Thick Liquid: Impaired Presentation: Cup;Straw Oral phase functional implications: Oral holding   Honey Thick Honey Thick Liquid: Not tested   Puree Puree: Within functional limits Presentation: Spoon   Solid     Solid: Impaired Oral Phase Impairments: Impaired mastication Oral Phase Functional Implications: Oral residue;Impaired mastication;Prolonged oral transit Pharyngeal Phase Impairments: Cough - Immediate      Celedonio Savage, MA, [place] Office: [phone] 07/03/2019,10:58 AM

## 2019-07-03 NOTE — Progress Notes (Addendum)
  Date: 07/03/2019  Patient name: Luis Lyons  Medical record number: [id number]  Date of birth: 10-06-1940   I have seen and evaluated this patient and I have discussed the plan of care with the house staff. Please see their note for complete details. I concur with their findings with the following additions/corrections:   Progressing well, will continue to advance diet as able. Still unclear source of leukocytosis, no fevers. Had pyuria, but urine culture only grew yeast. Candiduria is usually colonization, not inclined to treat in absence of signs of systemic candida infection. Today is day 5 of antibiotics, will stop and trend fever curve and WBC.   Lenice Pressman, M.D., Ph.D. 07/03/2019, 5:45 PM  Addendum: For documentation purposes, please note I agree with nursing documentation of his pressure injury on his left buttock which is stage and was present prior to admission.

## 2019-07-03 NOTE — Progress Notes (Addendum)
   Subjective:  Mr. Blackmore was seen at bedside this morning. He states that he slept well last night. He endorses a large bowel movement yesterday, and having flatus. He states that he is swallowing his food well. He denies vomiting, choking, fevers, or abdominal pain. All questions and concerns were addressed.   Objective:  Vital signs in last 24 hours: Vitals:   07/03/19 0341 07/03/19 0737 07/03/19 1000 07/03/19 1137  BP: (!) 154/60     Pulse: 72  (!) 58   Resp: (!) 23  (!) 22   Temp: 98.9 F (37.2 C)   98.8 F (37.1 C)  TempSrc: Oral Oral  Oral  SpO2: 96%  92% 95%  Weight: 78.4 kg     Height:       Physical Exam Constitutional:      General: He is not in acute distress.    Appearance: He is not ill-appearing or toxic-appearing.  HENT:     Head: Normocephalic and atraumatic.  Cardiovascular:     Rate and Rhythm: Normal rate and regular rhythm.     Pulses: Normal pulses.     Heart sounds: Normal heart sounds. No murmur. No gallop.   Pulmonary:     Effort: Pulmonary effort is normal.     Breath sounds: Normal breath sounds. No wheezing, rhonchi or rales.  Abdominal:     General: Abdomen is flat. Bowel sounds are normal.     Palpations: Abdomen is soft.  Neurological:     Mental Status: He is alert.     Assessment/Plan:  Principal Problem:   SBO (small bowel obstruction) (HCC) Active Problems:   Pressure injury of skin   AKI (acute kidney injury) ([place])   Acute blood loss anemia   Upper GI bleed  SBO:  - Patient improving. He endorses flatus, bowel movements and appetite. We will continue to monitor.  - Surgery recommends getting a speech therapy swallow evaluation and advancing his diet per their recommendations. We appreciate their assistance.  - GI: Continue IV Protonix if diet advances, consider PO per gastroenterology. We appreciate their assistance. We appreciate their assistance.  - NGT removed - Cefepime 2 g, IV 200 mL/hr. - Vancomycin IV 40 mg.  - 0.9%  NaCl infusion 41ml/hr ordered. - Diet: Dysphagia 2 per speech and language pathology. We appreciate their assistance.    - Continue pulse oximetry and monitor vitals.  - Urinalysis: Large amount of Leukocytes with few bacteria. 5 ketones and 30 protein.  - Blood cultures: NGTD (Day 3)   AKI: Resolved - Creatinine: 1.03 Dispo: Anticipated discharge pending medical course.   Maudie Mercury, MD 07/03/2019, 4:55 PM Pager: [phone]

## 2019-07-03 NOTE — Progress Notes (Signed)
[facility] Progress Note     Subjective: CC-  No abdominal complaints this morning. States that he had a large BM earlier today. He is passing flatus. Denies abdominal pain, nausea, vomiting. He was tolerating liquids yesterday. Awaiting speech consult for swallow eval due to concerns for aspiration.  Objective: Vital signs in last 24 hours: Temp:  [98.4 F (36.9 C)-98.9 F (37.2 C)] 98.9 F (37.2 C) (09/30 0341) Pulse Rate:  [55-86] 72 (09/30 0341) Resp:  [21-27] 23 (09/30 0341) BP: (133-157)/(52-86) 154/60 (09/30 0341) SpO2:  [91 %-100 %] 96 % (09/30 0341) Weight:  [78.4 kg] 78.4 kg (09/30 0341) Last BM Date: 06/30/19  Intake/Output from previous day: No intake/output data recorded. Intake/Output this shift: No intake/output data recorded.  PE: Gen: Alert, NAD, pleasant HEENT: EOM's intact, pupils equal and round Pulm:Rate andeffort normal Abd: Soft,ND, nontender, +BS Skin:warm and dry    Lab Results:  Recent Labs    07/01/19 0638 07/01/19 1245 07/02/19 0757  WBC 16.3*  --  15.5*  HGB 8.9* 8.9* 8.7*  HCT 27.5* 28.4* 28.6*  PLT 397  --  415*   BMET Recent Labs    07/01/19 0638 07/02/19 0757  NA 141 144  K 4.3 4.7  CL 108 110  CO2 23 25  GLUCOSE 110* 93  BUN 30* 19  CREATININE 1.34* 1.03  CALCIUM 9.2 9.3   PT/INR Recent Labs    07/01/19 0936  LABPROT 15.9*  INR 1.3*   CMP     Component Value Date/Time   NA 144 07/02/2019 0757   K 4.7 07/02/2019 0757   CL 110 07/02/2019 0757   CO2 25 07/02/2019 0757   GLUCOSE 93 07/02/2019 0757   BUN 19 07/02/2019 0757   CREATININE 1.03 07/02/2019 0757   CALCIUM 9.3 07/02/2019 0757   PROT 6.9 07/01/2019 0638   ALBUMIN 2.2 (L) 07/01/2019 0638   AST 13 (L) 07/01/2019 0638   ALT 11 07/01/2019 0638   ALKPHOS 60 07/01/2019 0638   BILITOT 0.8 07/01/2019 0638   GFRNONAA >60 07/02/2019 0757   GFRAA >60 07/02/2019 0757   Lipase     Component Value Date/Time   LIPASE 25 08/28/2018 2142        Studies/Results: Dg Abd Portable 1v  Result Date: 07/01/2019 CLINICAL DATA:  Follow up small bowel obstruction EXAM: PORTABLE ABDOMEN - 1 VIEW COMPARISON:  06/30/2019 FINDINGS: Previously administered contrast now lies throughout the colon consistent with a partial small bowel obstruction. Persistent proximal small bowel dilatation remains. No free air is noted. IMPRESSION: Stable changes of partial small bowel obstruction. Electronically Signed   By: Inez Catalina M.D.   On: 07/01/2019 10:10    Anti-infectives: Anti-infectives (From admission, onward)   Start     Dose/Rate Route Frequency Ordered Stop   07/01/19 1900  ceFEPIme (MAXIPIME) 2 g in sodium chloride 0.9 % 100 mL IVPB     2 g 200 mL/hr over 30 Minutes Intravenous Every 12 hours 06/30/19 1854     06/30/19 2200  vancomycin (VANCOCIN) IVPB 750 mg/150 ml premix     750 mg 150 mL/hr over 60 Minutes Intravenous Every 24 hours 06/30/19 0554     06/30/19 1800  ceFEPIme (MAXIPIME) 2 g in sodium chloride 0.9 % 100 mL IVPB  Status:  Discontinued     2 g 200 mL/hr over 30 Minutes Intravenous Every 12 hours 06/30/19 0554 06/30/19 1854   06/30/19 0345  vancomycin (VANCOCIN) 1,500 mg in sodium chloride 0.9 %  500 mL IVPB     1,500 mg 250 mL/hr over 120 Minutes Intravenous  Once 06/30/19 0338 06/30/19 0743   06/30/19 0330  ceFEPIme (MAXIPIME) 2 g in sodium chloride 0.9 % 100 mL IVPB     2 g 200 mL/hr over 30 Minutes Intravenous  Once 06/30/19 0324 06/30/19 0642   06/30/19 0330  metroNIDAZOLE (FLAGYL) IVPB 500 mg     500 mg 100 mL/hr over 60 Minutes Intravenous  Once 06/30/19 0324 06/30/19 0643   06/30/19 0330  vancomycin (VANCOCIN) IVPB 1000 mg/200 mL premix  Status:  Discontinued     1,000 mg 200 mL/hr over 60 Minutes Intravenous  Once 06/30/19 0324 06/30/19 [id number]       Assessment/Plan 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 h/o mouth cancer s/p surgery/radiation 2014 prostate cancer s/p prostatectomy CHF  (EF25-30%) H/o STEMI 7/2020deemed not a candidate for left heart catheterizationand was treated medically A fib on coumadin (INR 3.49/27) -INR 1.3 (9/28) HTN Code status DNR RLL mass concerning for neoplasm ?Upper GI bleed - on protonix, GI following  Sepsis- WBC down trending and lactic acidosis normalized; VSS  SBO - Patient with multiple prior abdominal surgeries including SBO requiring exploratory laparotomy lysis of adhesions 09/2018, prostatectomy, ex lap for ruptured appendicitis - CT scan9/27showedSBO with 2 possible transition zones, no bowel wall thickening or evidence of pneumatosis; reviewed with MD and not concerned for closed loop obstruction - xray with contrast in colon  ID -maxipime/vancomycin 9/27>> VTE -SCDs FEN -IVF, FLD Foley -none Follow up -TBD  Plan: Patient having bowel function and was tolerating liquids, obstruction resolving. Ok to advance diet as tolerated after speech therapy swallow evaluation. General surgery will sign off, please call with concerns. He does not need any surgical follow up.    LOS: 3 days    Wellington Hampshire , [facility] 07/03/2019, 7:44 AM Pager: [phone] Mon-Thurs 7:00 am-4:30 pm Fri 7:00 am -11:30 AM Sat-Sun 7:00 am-11:30 am

## 2019-07-03 NOTE — Progress Notes (Signed)
Pharmacy Antibiotic Note  Luis Lyons is a 78 y.o. male admitted on 06/30/2019 with SBO WBC 15.5, afebrile Culture negative to date  Day # 4 of broad spectrum antibiotics   Plan: Continue Cefepime 2 grams iv Q 12 hours Continue Vancomycin 750 mg iv Q 24 - > consider stopping?   Height: 6\' 1"  (185.4 cm) Weight: 172 lb 13.5 oz (78.4 kg) IBW/kg (Calculated) : 79.9  Temp (24hrs), Avg:98.7 F (37.1 C), Min:98.4 F (36.9 C), Max:98.9 F (37.2 C)  Recent Labs  Lab 06/30/19 0223 06/30/19 0227 06/30/19 0819 07/01/19 0638 07/02/19 0757  WBC 40.5*  --   --  16.3* 15.5*  CREATININE 2.26*  --   --  1.34* 1.03  LATICACIDVEN  --  2.2* 1.6  --   --     Estimated Creatinine Clearance: 66.6 mL/min (by C-G formula based on SCr of 1.03 mg/dL).    Allergies  Allergen Reactions  . Bactrim [Sulfamethoxazole-Trimethoprim] Other (See Comments)    Listed on [facility]     Thank you for allowing pharmacy to be a part of this patient's care. Anette Guarneri, PharmD [phone] 07/03/2019 8:34 AM

## 2019-07-04 DIAGNOSIS — K565 Intestinal adhesions [bands], unspecified as to partial versus complete obstruction: Secondary | ICD-10-CM

## 2019-07-04 DIAGNOSIS — Z7901 Long term (current) use of anticoagulants: Secondary | ICD-10-CM

## 2019-07-04 DIAGNOSIS — D72829 Elevated white blood cell count, unspecified: Secondary | ICD-10-CM

## 2019-07-04 DIAGNOSIS — R1310 Dysphagia, unspecified: Secondary | ICD-10-CM

## 2019-07-04 LAB — BASIC METABOLIC PANEL
Anion gap: 7 (ref 5–15)
BUN: 9 mg/dL (ref 8–23)
CO2: 24 mmol/L (ref 22–32)
Calcium: 8.8 mg/dL — ABNORMAL LOW (ref 8.9–10.3)
Chloride: 106 mmol/L (ref 98–111)
Creatinine, Ser: 0.78 mg/dL (ref 0.61–1.24)
GFR calc Af Amer: >60 mL/min (ref >60)
GFR calc non Af Amer: >60 mL/min (ref >60)
Glucose, Bld: 105 mg/dL — ABNORMAL HIGH (ref 70–99)
Potassium: 3.7 mmol/L (ref 3.5–5.1)
Sodium: 137 mmol/L (ref 135–145)

## 2019-07-04 NOTE — NC FL2 (Signed)
[place] MEDICAID FL2 LEVEL OF CARE SCREENING TOOL     IDENTIFICATION  Patient Name: Luis Lyons Birthdate: [birth year] Sex: male Admission Date (Current Location): 06/30/2019  [facility] and [place] Number:  Herbalist and Address:  [facility], [place] [address] [place],  [address]      Provider Number: [id number]  Attending Physician Name and Address:  Oda Kilts, MD  Relative Name and Phone Number:  Jocelyn Lamer, daughter, [phone]    Current Level of Care: Hospital Recommended Level of Care: [place] Prior Approval Number:    Date Approved/Denied:   PASRR Number: [id number]  Discharge Plan: SNF    Current Diagnoses: Patient Active Problem List   Diagnosis Date Noted  . AKI (acute kidney injury) ([place]) 07/01/2019  . Acute blood loss anemia 07/01/2019  . Upper GI bleed 07/01/2019  . Pressure injury of skin 06/30/2019  . Sepsis due to pneumonia ([place]) 04/20/2019  . Acute respiratory failure with hypoxia ([place]) 04/20/2019  . History of CVA (cerebrovascular accident) 04/20/2019  . Hyperglycemia 04/20/2019  . Hypokalemia 04/20/2019  . Renal insufficiency 04/20/2019  . Prolonged QT interval 04/20/2019  . HCAP (healthcare-associated pneumonia)   . CAP (community acquired pneumonia) 08/29/2018  . Essential hypertension 08/29/2018  . Acute bronchitis 08/29/2018  . SBO (small bowel obstruction) ([place]) 08/28/2018    Orientation RESPIRATION BLADDER Height & Weight     Self  O2(Nasal cannula 2L) External catheter, Incontinent Weight: 172 lb 13.5 oz (78.4 kg) Height:  6\' 1"  (185.4 cm)  BEHAVIORAL SYMPTOMS/MOOD NEUROLOGICAL BOWEL NUTRITION STATUS      Continent Diet(Please see DC Summary)  AMBULATORY STATUS COMMUNICATION OF NEEDS Skin   Extensive Assist Verbally PU Stage and Appropriate Care(Stage II on buttocks)                       Personal Care Assistance Level of Assistance  Bathing, Feeding,  Dressing Bathing Assistance: Maximum assistance Feeding assistance: Independent Dressing Assistance: Limited assistance     Functional Limitations Info  Sight, Hearing, Speech Sight Info: Adequate Hearing Info: Adequate Speech Info: Adequate    SPECIAL CARE FACTORS FREQUENCY                       Contractures Contractures Info: Not present    Additional Factors Info  Code Status, Allergies, Isolation Precautions Code Status Info: DNR Allergies Info: Bactrim (Sulfamethoxazole-trimethoprim)     Isolation Precautions Info: ESBL     Current Medications (07/04/2019):  This is the current hospital active medication list Current Facility-Administered Medications  Medication Dose Route Frequency Provider Last Rate Last Dose  . 0.9 %  sodium chloride infusion   Intravenous Continuous Marianna Payment, MD 75 mL/hr at 07/03/19 1029    . acetaminophen (TYLENOL) tablet 650 mg  650 mg Oral Q6H PRN Asencion Noble, MD       Or  . acetaminophen (TYLENOL) suppository 650 mg  650 mg Rectal Q6H PRN Asencion Noble, MD      . chlorhexidine (PERIDEX) 0.12 % solution 15 mL  15 mL Mouth Rinse BID Oda Kilts, MD   15 mL at 07/04/19 0835  . MEDLINE mouth rinse  15 mL Mouth Rinse q12n4p Oda Kilts, MD   15 mL at 07/03/19 1540  . pantoprazole (PROTONIX) injection 40 mg  40 mg Intravenous Q12H Ronnette Juniper, MD   40 mg at 07/04/19 0829  . polyvinyl alcohol (LIQUIFILM  TEARS) 1.4 % ophthalmic solution 1 drop  1 drop Both Eyes BID Asencion Noble, MD   1 drop at 07/04/19 [id number]  . Resource ThickenUp Clear   Oral PRN Oda Kilts, MD         Discharge Medications: Please see discharge summary for a list of discharge medications.  Relevant Imaging Results:  Relevant Lab Results:   Additional Information [id number]     COVID negative on 9/27  Lissa Morales Monserrath Junio, LCSW

## 2019-07-04 NOTE — Progress Notes (Signed)
Pt's MEWS score 2/yellow r/t RR. Pt appears to have had elevated RR on prior shifts. Pt appears stable and no complaints of SOB. Will alert MD anyways and continue to monitor.     07/04/19 0835  MEWS Score  MEWS RR 2  MEWS Pulse 0  MEWS Systolic 0  MEWS LOC 0  MEWS Temp 0  MEWS Score 2  MEWS Score Color Yellow  MEWS Assessment  Is this an acute change? No

## 2019-07-04 NOTE — TOC Initial Note (Signed)
Transition of Care [facility]) - Initial/Assessment Note    Patient Details  Name: Luis Lyons MRN: [id number] Date of Birth: 08-01-1941  Transition of Care [facility]) CM/SW Contact:    Benard Halsted, LCSW Phone Number: 07/04/2019, 3:04 PM  Clinical Narrative:                 CSW spoke with patient's daughter regarding discharge plan. She reported that patient has resided at [facility]) under long term care and will return at discharge. No other needs expressed at this time. COVID test pending.   Expected Discharge Plan: Skilled Nursing Facility Barriers to Discharge: Continued Medical Work up   Patient Goals and CMS Choice Patient states their goals for this hospitalization and ongoing recovery are:: Return to SNF CMS Medicare.gov Compare Post Acute Care list provided to:: Patient Represenative (must comment)(Daughter) Choice offered to / list presented to : Adult Children(Daughter)  Expected Discharge Plan and Services Expected Discharge Plan: [place] In-house Referral: Clinical Social Work Discharge Planning Services: NA Post Acute Care Choice: [place] Living arrangements for the past 2 months: [place]                 DME Arranged: N/A DME Agency: NA       HH Arranged: NA          Prior Living Arrangements/Services Living arrangements for the past 2 months: [place] Lives with:: Facility Resident Patient language and need for interpreter reviewed:: No Do you feel safe going back to the place where you live?: Yes      Need for Family Participation in Patient Care: Yes (Comment) Care giver support system in place?: Yes (comment)   Criminal Activity/Legal Involvement Pertinent to Current Situation/Hospitalization: No - Comment as needed  Activities of Daily Living      Permission Sought/Granted Permission sought to share information with : Facility Sport and exercise psychologist, Family  Supports Permission granted to share information with : Yes, Verbal Permission Granted  Share Information with NAME: [name]  Permission granted to share info w AGENCY: [facility]  Permission granted to share info w Relationship: Daughter  Permission granted to share info w Contact Information: [phone]  Emotional Assessment Appearance:: Appears stated age Attitude/Demeanor/Rapport: Unable to Assess Affect (typically observed): Unable to Assess Orientation: : Oriented to Self, Oriented to  Time, Oriented to Situation Alcohol / Substance Use: Not Applicable Psych Involvement: No (comment)  Admission diagnosis:  Lung mass [R91.8] Small bowel obstruction (HCC) [K56.609] SBO (small bowel obstruction) ([place]) [K56.609] Adrenal nodule (HCC) [E27.8] Encounter for imaging study to confirm nasogastric (NG) tube placement [Z01.89] Hematemesis with nausea [K92.0] Patient Active Problem List   Diagnosis Date Noted  . AKI (acute kidney injury) ([place]) 07/01/2019  . Acute blood loss anemia 07/01/2019  . Upper GI bleed 07/01/2019  . Pressure injury of skin 06/30/2019  . Sepsis due to pneumonia ([place]) 04/20/2019  . Acute respiratory failure with hypoxia ([place]) 04/20/2019  . History of CVA (cerebrovascular accident) 04/20/2019  . Hyperglycemia 04/20/2019  . Hypokalemia 04/20/2019  . Renal insufficiency 04/20/2019  . Prolonged QT interval 04/20/2019  . HCAP (healthcare-associated pneumonia)   . CAP (community acquired pneumonia) 08/29/2018  . Essential hypertension 08/29/2018  . Acute bronchitis 08/29/2018  . SBO (small bowel obstruction) ([place]) 08/28/2018   PCP:  System, Pcp Not In Pharmacy:   [facility] - [address] [place], [place] [address] Phone: [phone] Fax: [phone]  Social Determinants of Health (SDOH) Interventions    Readmission Risk Interventions Readmission Risk Prevention Plan 07/04/2019  Transportation  Screening Complete  PCP or Specialist Appt within 5-7 Days Complete  Home Care Screening Complete  Medication Review (RN CM) Complete

## 2019-07-04 NOTE — Progress Notes (Signed)
   Subjective:  Luis Lyons was seen at bedside. He states that he is doing well and that the food is "excellent." He states that he passed gas and had a bowel movement yesterday, but has not had any today. He states that he is not having issues vomiting, aspirating, abdominal pain, or fevers. All questions and concerns were addressed.   Objective:  Vital signs in last 24 hours: Vitals:   07/03/19 1137 07/03/19 2026 07/04/19 0017 07/04/19 0430  BP:      Pulse:      Resp:      Temp: 98.8 F (37.1 C) 98.2 F (36.8 C) 98.3 F (36.8 C) 98 F (36.7 C)  TempSrc: Oral Oral Oral Oral  SpO2: 95%     Weight:      Height:       Physical Exam Constitutional:      General: He is not in acute distress.    Appearance: Normal appearance. He is obese. He is not ill-appearing, toxic-appearing or diaphoretic.  HENT:     Head: Normocephalic and atraumatic.  Cardiovascular:     Rate and Rhythm: Normal rate and regular rhythm.     Pulses: Normal pulses.     Heart sounds: Normal heart sounds.  Pulmonary:     Effort: Pulmonary effort is normal. No respiratory distress.     Breath sounds: Normal breath sounds. No wheezing or rhonchi.  Abdominal:     General: Abdomen is flat. Bowel sounds are normal.     Palpations: Abdomen is soft.     Tenderness: There is no abdominal tenderness. There is no guarding or rebound.  Neurological:     Mental Status: He is alert and oriented to person, place, and time.    Assessment/Plan:  Principal Problem:   SBO (small bowel obstruction) (HCC) Active Problems:   Pressure injury of skin   AKI (acute kidney injury) ([place])   Acute blood loss anemia   Upper GI bleed  SBO:  - Patient passed gas and had a bowel movement yesterday, but has not had either today. His bowel sounds are active. He is eating food without vomiting or nausea. A COVID test has been ordered for him to return to SNF.   - Surgery recommends getting a speech therapy swallow evaluation and  advancing his diet per their recommendations. We appreciate their assistance.  - GI: Continue IV Protonix if diet advances, consider PO per gastroenterology. We appreciate their assistance. We appreciate their assistance.   - 0.9% NaCl infusion 110ml/hr ordered. - Diet: Dysphagia 2 per speech and language pathology. We appreciate their assistance.    - Continue pulse oximetry and monitor vitals.  - Blood cultures: NGTD - Urine culture: growth for yeast, as he is having no symptoms we are hesitant to treat at this time.   AKI: Resolved Dispo: Anticipated discharge pending medical course.   Maudie Mercury, MD 07/04/2019, 6:32 AM Pager: [phone]

## 2019-07-04 NOTE — Progress Notes (Signed)
Subjective: No nausea, vomiting. No hematemesis or blood in stool. No abdominal pain. Tolerating soft diet. Having bowel movements, passing flatus.  Objective: Vital signs in last 24 hours: Temp:  [98 F (36.7 C)-98.8 F (37.1 C)] 98 F (36.7 C) (10/01 0834) Pulse Rate:  [58-97] 71 (10/01 0834) Resp:  [22-29] 29 (10/01 0834) BP: (122-159)/(54-66) 123/59 (10/01 0834) SpO2:  [92 %-100 %] 100 % (10/01 0834) Weight change:  Last BM Date: 06/30/19  PE: GEN:  NAD ABD:  Soft, non-tender, mild protuberant  Lab Results: CBC    Component Value Date/Time   WBC 15.5 (H) 07/02/2019 0757   RBC 3.11 (L) 07/02/2019 0757   HGB 8.7 (L) 07/02/2019 0757   HCT 28.6 (L) 07/02/2019 0757   PLT 415 (H) 07/02/2019 0757   MCV 92.0 07/02/2019 0757   MCH 28.0 07/02/2019 0757   MCHC 30.4 07/02/2019 0757   RDW 15.9 (H) 07/02/2019 0757   LYMPHSABS 1.5 04/25/2019 0520   MONOABS 0.6 04/25/2019 0520   EOSABS 0.3 04/25/2019 0520   BASOSABS 0.0 04/25/2019 0520   CMP     Component Value Date/Time   NA 137 07/04/2019 0718   K 3.7 07/04/2019 0718   CL 106 07/04/2019 0718   CO2 24 07/04/2019 0718   GLUCOSE 105 (H) 07/04/2019 0718   BUN 9 07/04/2019 0718   CREATININE 0.78 07/04/2019 0718   CALCIUM 8.8 (L) 07/04/2019 0718   PROT 6.9 07/01/2019 0638   ALBUMIN 2.2 (L) 07/01/2019 0638   AST 13 (L) 07/01/2019 0638   ALT 11 07/01/2019 0638   ALKPHOS 60 07/01/2019 0638   BILITOT 0.8 07/01/2019 0638   GFRNONAA >60 07/04/2019 0718   GFRAA >60 07/04/2019 0718   Assessment:  1. Small bowel obstruction, likely adhesions, seemingly resolved. 2. Coffee grounds in NGT, scant, likely either from esophagitis or trauma from tube insertion in setting of anticoagulation. No overt GI bleeding for days. 3. Stroke, on anticoagulation (currently on hold). 4. History mouth and prostate cancer. 5. Myocardial infarction July 2020. 6. Right lung mass.  Plan:  1.  Pantoprazole 40 mg po bid x 6 weeks, then 40  mg po qd thereafter indefinitely. 2.  No plans for endoscopy at this time. 3.  Hold anticoagulation, if clinically feasible, for another 10-14 days before resuming. 4.  [facility] will sign-off; patient can follow-up with [place] as outpatient in 6-8 weeks [facility] [phone]).  Thank you for the consultation; please call with any questions.   Landry Dyke 07/04/2019, 9:57 AM   Cell [phone] If no answer or after 5 PM call [phone]

## 2019-07-04 NOTE — Progress Notes (Signed)
  Speech Language Pathology Treatment: Dysphagia  Patient Details Name: Luis Lyons MRN: [id number] DOB: [birth year] Today's Date: 07/04/2019 Time: 1352-1410 SLP Time Calculation (min) (ACUTE ONLY): 18 min  Assessment / Plan / Recommendation Clinical Impression  Skilled treatment session focused on dysphagia goals. SLP received upright in bed. He was feeding himself dysphagia 2 lunch with nectar thick liquids via straw. Pt with mildly prolonged bolus formation and mastication but no overt s/s of aspiration. Pt also consumed nectar thick liquids via straw with clear vocal quality and no observable s/s of aspiration. Education provided on possibility of instrumental study and trials of thin liquids. Pt stated that he prefers to remain on nectar thick liquids. Pt left upright with lunch tray and all needs within reach. Continue per current plan of care.    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 CT shows SBO, but delayed abd XR shows contrast progression, suggesting partial SBO. Upper GI bleed possibly due to Mallory-Weiss tear, Hgb down to 8.9 from baseline 11, but stable on repeat this afternoon at 8.9. Will recheck in am, GI recommends no EGD for now unless destabilizing bleed. Tolerating full liquid diet, and medically appropriate to advance diet as tolerated. 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      SLP Plan  Continue with current plan of care       Recommendations  Diet recommendations: Dysphagia 2 (fine chop);Nectar-thick liquid Liquids provided via: Cup;Straw Medication Administration: Whole meds with liquid Supervision: Patient able to self feed Compensations: Slow rate;Small sips/bites Postural Changes and/or Swallow Maneuvers: Seated upright 90 degrees                Oral Care Recommendations: Oral care BID Follow up Recommendations: Skilled Nursing facility SLP Visit Diagnosis: Dysphagia, oropharyngeal phase (R13.12) Plan: Continue with current plan of care       GO                Jaena Brocato 07/04/2019, 2:11 PM

## 2019-07-04 NOTE — Progress Notes (Signed)
  Date: 07/04/2019  Patient name: Luis Lyons  Medical record number: [id number]  Date of birth: 1940-11-02        I have seen and evaluated this patient and I have discussed the plan of care with the house staff. Please see their note for complete details. I concur with their findings with the following additions/corrections: Mr Delbene was seen this morning on team rounds.  He had no complaints today.   1.  SBO secondary to adhesions from prior GI surgery -he has regained bowel function evidenced by ability to take p.o., positive bowel sounds, and presence of gas and bowel movements.  2.  Leukocytosis, etiology unknown - he has been afebrile for about 72 hours.  His white blood cell count is trending down off antibiotics.  Urine culture showed yeast..  Blood cultures are no growth to date.  Will follow clinically.  3.  GI bleed, presumed upper due to coffee-ground emesis and NG tube -anticoagulation on hold, hemoglobin stable for the past 3 days although it is lower than baseline.  He is on a PPI in case this is gastritis.  4.  History of stroke, mechanism unknown -warfarin is being held due to his GI bleed.  Will need to know why he is on warfarin but I can find no records regarding this nor any head imaging to see if this was likely embolic.  This will determine resumption of his warfarin or whether all he needs is an antiplatelet agent.  He is also on Plavix at home.  Bartholomew Crews, MD 07/04/2019, 2:32 PM

## 2019-07-04 NOTE — Progress Notes (Signed)
Patients RR rate 27. Luis Najjar, NP notified. Will continue to monitor.

## 2019-07-04 NOTE — Discharge Summary (Addendum)
Name: Luis Lyons MRN: [id number] DOB: [birth year] 78 y.o. PCP: System, Pcp Not In  Date of Admission: 06/30/2019  2:16 AM Date of Discharge: 07/09/2019 Attending Physician: Luis Dresser, MD Discharge Diagnosis: 1. Upper GI Bleed 2. Small Bowel Obstruction 3. Atrial Fibulation 4. Heart Failure  5. AKI 6. Leukocytosis  7. Abnormal Chest xray    Discharge Medications: Allergies as of 07/09/2019      Reactions   Bactrim [sulfamethoxazole-trimethoprim] Other (See Comments)   Listed on MAR      Medication List    STOP taking these medications   ketoconazole 2 % cream Commonly known as: NIZORAL     TAKE these medications   acetaminophen 325 MG tablet Commonly known as: TYLENOL Take 650 mg by mouth 3 (three) times daily.   albuterol (2.5 MG/3ML) 0.083% nebulizer solution Commonly known as: PROVENTIL Take 2.5 mg by nebulization 2 (two) times daily as needed for wheezing or shortness of breath.   atorvastatin 80 MG tablet Commonly known as: LIPITOR Take 1 tablet (80 mg total) by mouth daily at 6 PM.   carvedilol 6.25 MG tablet Commonly known as: COREG Take 1 tablet (6.25 mg total) by mouth 2 (two) times daily with a meal.   chlorhexidine 0.12 % solution Commonly known as: PERIDEX 15 mLs by Mouth Rinse route 2 (two) times a day.   clopidogrel 75 MG tablet Commonly known as: PLAVIX Take 1 tablet (75 mg total) by mouth daily.   coal tar 0.5 % shampoo Commonly known as: NEUTROGENA T-GEL Apply 1 application topically 2 (two) times a week. Tuesday and Friday   Delsym 30 MG/5ML liquid Generic drug: dextromethorphan Take 60 mg by mouth 2 (two) times daily as needed for cough.   DentaGel 1.1 % Gel dental gel Generic drug: sodium fluoride Place 1 application onto teeth 2 (two) times daily.   diphenhydrAMINE 25 mg capsule Commonly known as: BENADRYL Take 25 mg by mouth every 8 (eight) hours as needed for itching.   docusate sodium 100 MG capsule Commonly  known as: COLACE Take 200 mg by mouth daily.   fluticasone 50 MCG/ACT nasal spray Commonly known as: FLONASE Place 1 spray into both nostrils daily.   furosemide 40 MG tablet Commonly known as: LASIX Take 1 tablet (40 mg total) by mouth every other day.   guaifenesin 400 MG Tabs tablet Commonly known as: HUMIBID E Take 400 mg by mouth every 6 (six) hours as needed (cough/congestion). What changed: Another medication with the same name was removed. Continue taking this medication, and follow the directions you see here.   ibuprofen 200 MG tablet Commonly known as: ADVIL Take 400 mg by mouth every 6 (six) hours as needed for moderate pain.   ipratropium-albuterol 0.5-2.5 (3) MG/3ML Soln Commonly known as: DUONEB Take 3 mLs by nebulization 4 (four) times daily.   losartan 25 MG tablet Commonly known as: COZAAR Take 1 tablet (25 mg total) by mouth daily.   magnesium hydroxide 400 MG/5ML suspension Commonly known as: MILK OF MAGNESIA Take 30 mLs by mouth daily as needed for mild constipation.   nystatin powder Generic drug: nystatin Apply 1 Bottle topically every 6 (six) hours as needed (diaper changes).   ondansetron 4 MG tablet Commonly known as: ZOFRAN Take 4 mg by mouth every 6 (six) hours as needed for nausea.   oxybutynin 5 MG 24 hr tablet Commonly known as: DITROPAN-XL Take 5 mg by mouth at bedtime.   phenazopyridine 200 MG tablet Commonly known as:  PYRIDIUM Take 200 mg by mouth 3 (three) times daily as needed for pain.   polyethylene glycol 17 g packet Commonly known as: MIRALAX / GLYCOLAX Take 17 g by mouth daily.   polyvinyl alcohol 1.4 % ophthalmic solution Commonly known as: LIQUIFILM TEARS Place 1 drop into both eyes 2 (two) times daily.   RISA-BID PROBIOTIC PO Take 2 tablets by mouth 2 (two) times a day.   senna 8.6 MG tablet Commonly known as: SENOKOT Take 2 tablets by mouth at bedtime.   Simethicone 180 MG Caps Take 180 mg by mouth 2 (two)  times daily as needed (gas).   spironolactone 25 MG tablet Commonly known as: ALDACTONE Take 1 tablet (25 mg total) by mouth daily.   vitamin B-12 1000 MCG tablet Commonly known as: CYANOCOBALAMIN Take 1,000 mcg by mouth daily.   warfarin 5 MG tablet Commonly known as: COUMADIN Take 1 tablet (5 mg total) by mouth one time only at 6 PM. What changed:   medication strength  how much to take  when to take this  Another medication with the same name was removed. Continue taking this medication, and follow the directions you see here.       Disposition and follow-up:   Luis Lyons was discharged from [facility] in Stable condition.  At the hospital follow up visit please address:  1.  Swallowing precautions, follow up on leukocytosis,    2.  Labs / imaging needed at time of follow-up: Follow CBC, Follow up xray in 6 weeks for pulmonary mass that could be likely 2/2 to infectious etiology or neoplasm.  Continue following INR and optimize warfarin.     3.  Pending labs/ test needing follow-up: N/A  Follow-up Appointments: Contact information for after-discharge care    Destination    [facility] .   Service: Skilled Nursing Contact information: [address] [place] Hospital Course by problem list: 1. Upper GI Bleed: Luis Lyons presented to [facility] after having several vomiting episodes concerning for "coffee grounds." Luis Lyons states that he had several hematemesis episodes during his ED stay. Gastroenterology stated that in the presence of a concurrent SBO that endoscopy would be contraindicated at this time. They recommended keeping him on PPI twice and monitoring for instability. His Hbg dropped on admission from 12.6 to 8.9. He was placed on a NG tube for decompression. His warfarin was held in the presence of a GI Bleed. His warfarin was  restarted on 07/06/2019 and his hemoglobin has been stable.   2. Small Bowel Obstruction:  Upon admission, Luis Lyons was found to have a small bowel obstruction via CT Abdomen Pelvis WO contrast. During his admission it was noted that he had not passed flatus or had a bowel movement, with diminished bowel sounds. Surgery and gastroenterology were consulted. Surgery stated that he would be a poor surgical candidate at this time, and to place an NG tube for decompression, give gastrografin with delayed abdominal filming. His follow up xrays were shown that the gastrografin had moved to the R colon. His diet was moved from NPO to small sips of clears and advanced to Dysphagia diet 2 as tolerated. He began to have bowel movements, pass flatus, and tolerate his diet.   3. Atrial Fibulation:  Mr. Malonzo also has chronic A fib, which is treated with warfarin  and carvedilol. His warfarin was held due to his upper GI bleed. While he had been in our service, it was found that he had increased tachypnea and tachycardia. In the history of tachycardia and tachypnea, and being off his warfarin, with factors for embolism a  CT Angio Chest PE, and EKG was ordered. The imaging came back negative for embolism. His EKG showed no evidence of Right heart strain. He was started back on his warfarin and will be discharged with his home dose.   4. Heart Failure Reduced Ejection Fraction: In the presence of his AKI, Mr. Maue's home Lasix and ACE inhibitor was held initially to reduce further kidney damage. During his hospitalization he was found to have crackles on physical exam on 10/3-01/2019. He was given 40 mg IV of Lasix with resolution of his crackles. He was started back on his home medications of lasix with the resolution of his AKI.   5. Acute Kidney Injury:  Mr. Strube was admitted with an acute kidney injury with an increase in his creatinine to 2.26. his elevated creatinine was most likely secondary to volume depletion  related to his multiple bouts of vomitus. He was administered multiple boluses of LR and maintenance fluids. His AKI resolved and his creatinine was 0.90.  6. Leukocytosis: Mr. Knotts was admitted with a leukocytosis of 40.5. He was started on Cefepime and vancomycin. His leukocytosis continued to trend down throughout his stay, and he was discharged with a WBC 10.6. During his stay, it was found that he also had a infrahilar pulmonary mass with adjacent streaking airspace opacity, which could be due to infectious or neoplastic etiology. It is recommended to receive an xray in 6 weeks time to follow up.    7. Abnormal Imaging:  During his hospitalization, Mr. Hopson was found to have a posterior infrahilar pulmonary mass measuring 5.8 x 5.2 x6.7 cm with adjacent streaky/patchy airspace opacity. Which was explained as possible infection or pulmonary neoplasm. Please follow up in 6 weeks.   Discharge Vitals:   BP (!) 121/54   Pulse 64   Temp 98 F (36.7 C) (Axillary)   Resp 17   Ht 6\' 1"  (1.854 m)   Wt 79.7 kg   SpO2 94%   BMI 23.18 kg/m   Pertinent Labs, Studies, and Procedures:  CT Angio Chest PE W or WO Contrast:  IMPRESSION: 1. No central, segmental, or subsegmental pulmonary embolism. 2. Right posterior infrahilar pulmonary mass measuring 5.8 x 5.2 x 6.7 cm with adjacent streaky/patchy airspace opacity. This could be due to pulmonary neoplasm or infectious etiology. 3. Small right and trace left pleural effusion. 4. Tree-in-bud opacities in the posterior left lower lung which could be due to inflammatory or infectious etiology.  06/30/2019 Chest Xray:  IMPRESSION: 1. Mild chronic bronchitic changes, most notable at the lung bases. 2. No other superimposed active cardiopulmonary disease.  CT Abdomen Pelvis WO Contrast:  IMPRESSION: 1. Small bowel obstruction with jejunal loops dilated up to 4.7 cm diameter. 2 separate transition zones are identified, 1 in the proximal jejunum  just distal to the ligament of Treitz. The second is in the mid to distal small bowel and located in the central pelvis. Given the presence of 2 transition zones, closed loop obstruction is a distinct concern. No substantial bowel wall thickening or evidence of pneumatosis. No substantial mesenteric edema, interloop mesenteric fluid, or free fluid in the peritoneal cavity. 2. 6.1 x 4.4 cm soft tissue mass in the medial right lower lobe  highly concerning for neoplasm. 3. 2.3 cm right adrenal nodule.  Metastatic disease a concern. 4.  Aortic Atherosclerois (ICD10-170.0)  DG Abd Portable: 06/30/2019 IMPRESSION: 1. Persistent small bowel obstruction. The oral contrast is now seen in more distal nondilated loops of small bowel and the right side of the colon consistent with partial obstruction. 2. The side port of the NG tube is just below the GE junction with the distal tip in the fundus.  DG Abd Portable: 07/01/2019 FINDINGS: Previously administered contrast now lies throughout the colon consistent with a partial small bowel obstruction. Persistent proximal small bowel dilatation remains. No free air is noted.  DG Chest Port 1 view: 07/05/2019 IMPRESSION: Redemonstration of right lower lobe mass with increasing basilar opacities on the right potentially related to developing postobstructive pneumonia.  Hazy opacities on the left may also represent developing infection.  Discharge Instructions: Discharge Instructions    Diet - low sodium heart healthy   Complete by: As directed    Discharge instructions   Complete by: As directed    Thank you for coming to [facility] for your health care management. During your time with [place], you were found to have a small bowel obstruction, bloody vomitus, and treatment for your Atrial fibulation. We held your warfarin during your initial stay due to a possible upper GI bleed, and restarted it once your bleeding was under control and your hemoglobin  was stable. You were placed on the NG tube for decompression, and your diet was advanced until you began to pass gas and stool. You were also admitted with a white blood cell count of 40 and you were treated with IV antibiotics. Please come back for reevaluation if you experience increasing temperatures, chest pain, shortness of breath, bloody vomitus, or any acute changes of your health.   Increase activity slowly   Complete by: As directed       Signed: Maudie Mercury, MD 07/09/2019, 2:00 PM   Pager: [phone]

## 2019-07-05 ENCOUNTER — Inpatient Hospital Stay (HOSPITAL_COMMUNITY): Payer: Medicare Other

## 2019-07-05 DIAGNOSIS — I4891 Unspecified atrial fibrillation: Secondary | ICD-10-CM

## 2019-07-05 DIAGNOSIS — Z7401 Bed confinement status: Secondary | ICD-10-CM

## 2019-07-05 LAB — CBC
HCT: 26.8 % — ABNORMAL LOW (ref 39.0–52.0)
Hemoglobin: 8.5 g/dL — ABNORMAL LOW (ref 13.0–17.0)
MCH: 28.3 pg (ref 26.0–34.0)
MCHC: 31.7 g/dL (ref 30.0–36.0)
MCV: 89.3 fL (ref 80.0–100.0)
Platelets: 400 10*3/uL (ref 150–400)
RBC: 3 MIL/uL — ABNORMAL LOW (ref 4.22–5.81)
RDW: 15.3 % (ref 11.5–15.5)
WBC: 13.8 10*3/uL — ABNORMAL HIGH (ref 4.0–10.5)
nRBC: 0 % (ref 0.0–0.2)

## 2019-07-05 LAB — BASIC METABOLIC PANEL
Anion gap: 6 (ref 5–15)
BUN: 7 mg/dL — ABNORMAL LOW (ref 8–23)
CO2: 23 mmol/L (ref 22–32)
Calcium: 8.6 mg/dL — ABNORMAL LOW (ref 8.9–10.3)
Chloride: 106 mmol/L (ref 98–111)
Creatinine, Ser: 0.74 mg/dL (ref 0.61–1.24)
GFR calc Af Amer: >60 mL/min (ref >60)
GFR calc non Af Amer: >60 mL/min (ref >60)
Glucose, Bld: 113 mg/dL — ABNORMAL HIGH (ref 70–99)
Potassium: 3.4 mmol/L — ABNORMAL LOW (ref 3.5–5.1)
Sodium: 135 mmol/L (ref 135–145)

## 2019-07-05 LAB — CULTURE, BLOOD (ROUTINE X 2)
Culture: NO GROWTH
Culture: NO GROWTH
Special Requests: ADEQUATE
Special Requests: ADEQUATE

## 2019-07-05 MED ORDER — POTASSIUM CHLORIDE CRYS ER 20 MEQ PO TBCR
40.0000 meq | EXTENDED_RELEASE_TABLET | Freq: Once | ORAL | Status: AC
  Administered 2019-07-05: 40 meq via ORAL
  Filled 2019-07-05: qty 2

## 2019-07-05 MED ORDER — METOPROLOL TARTRATE 5 MG/5ML IV SOLN
5.0000 mg | Freq: Once | INTRAVENOUS | Status: AC | PRN
  Administered 2019-07-05: 5 mg via INTRAVENOUS

## 2019-07-05 MED ORDER — PANTOPRAZOLE SODIUM 40 MG PO TBEC: 40.0000 mg | DELAYED_RELEASE_TABLET | Freq: Every day | ORAL | Status: DC

## 2019-07-05 MED ORDER — PANTOPRAZOLE SODIUM 40 MG PO TBEC
40.0000 mg | DELAYED_RELEASE_TABLET | Freq: Two times a day (BID) | ORAL | Status: DC
  Administered 2019-07-05 – 2019-07-09 (×10): 40 mg via ORAL
  Filled 2019-07-05 (×10): qty 1

## 2019-07-05 MED ORDER — METOPROLOL TARTRATE 5 MG/5ML IV SOLN
INTRAVENOUS | Status: AC
  Filled 2019-07-05: qty 5

## 2019-07-05 MED ORDER — HEPARIN (PORCINE) 25000 UT/250ML-% IV SOLN
1500.0000 [IU]/h | INTRAVENOUS | Status: DC
  Administered 2019-07-06: 1100 [IU]/h via INTRAVENOUS
  Administered 2019-07-06: 1500 [IU]/h via INTRAVENOUS
  Filled 2019-07-05 (×2): qty 250

## 2019-07-05 MED ORDER — CARVEDILOL 6.25 MG PO TABS
6.2500 mg | ORAL_TABLET | Freq: Two times a day (BID) | ORAL | Status: DC
  Administered 2019-07-05 – 2019-07-09 (×9): 6.25 mg via ORAL
  Filled 2019-07-05 (×9): qty 1

## 2019-07-05 NOTE — Progress Notes (Signed)
Pt's MEWS score RED r/t HR in Afib. MD paged. Pt does not appear to be in distress. Will continue to monitor.     07/05/19 0921  Vitals  Pulse Rate (!) 152  ECG Heart Rate (!) 133  Resp (!) 27  Oxygen Therapy  SpO2 93 %  MEWS Score  MEWS RR 2  MEWS Pulse 3  MEWS Systolic 0  MEWS LOC 0  MEWS Temp 0  MEWS Score 5  MEWS Score Color Red  MEWS Assessment  Is this an acute change? Yes  MEWS guidelines implemented *See Row Information* Red

## 2019-07-05 NOTE — Progress Notes (Signed)
Alerted MD of Pt's ongoing Afib/tachycardia and tachypnea. Orders placed for EKG, CXR and then CT angio. Will continue to monitor.     07/05/19 1557  Vitals  BP (!) 145/64  MAP (mmHg) 85  BP Location Left Arm  BP Method Automatic  Pulse Rate (!) 112  ECG Heart Rate (!) 112  Resp (!) 34  Oxygen Therapy  SpO2 95 %  MEWS Score  MEWS RR 2  MEWS Pulse 2  MEWS Systolic 0  MEWS LOC 0  MEWS Temp 0  MEWS Score 4  MEWS Score Color Red  MEWS Assessment  Is this an acute change? No  MEWS guidelines implemented *See Row Information* Red

## 2019-07-05 NOTE — Progress Notes (Addendum)
ANTICOAGULATION CONSULT NOTE - Initial Consult  Pharmacy Consult for Warfarin Indication: atrial fibrillation  Allergies  Allergen Reactions  . Bactrim [Sulfamethoxazole-Trimethoprim] Other (See Comments)    Listed on [facility]    Patient Measurements: Height: 6\' 1"  (185.4 cm) Weight: 172 lb 13.5 oz (78.4 kg) IBW/kg (Calculated) : 79.9  Vital Signs: Temp: 98 F (36.7 C) (10/02 1151) Temp Source: Oral (10/02 1151) BP: 145/64 (10/02 1557) Pulse Rate: 112 (10/02 1557)  Labs: Recent Labs    07/04/19 0718 07/05/19 0900  HGB  --  8.5*  HCT  --  26.8*  PLT  --  400  CREATININE 0.78 0.74    Estimated Creatinine Clearance: 85.8 mL/min (by C-G formula based on SCr of 0.74 mg/dL).   Medical History: Past Medical History:  Diagnosis Date  . Abdominal hernia   . Cancer ([place])   . Constipation   . Cough   . Depression   . Dry mouth   . Hard of hearing   . Hypertension   . Muscle weakness   . Stroke ([place])   . Urinary incontinence   . Vitamin D deficiency   . Vomiting   . 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 Pt was on warfarin 2.5 mg PO daily at home for a fib; warfarin has been held this admission, due to GI bleed; last INR was 1.3 on 9/28. H/H 8.5/26.8 (stable). Pharmacy is consulted to restart pt's warfarin, with goal of discharge back to SNF.   Other medical problems include: pressure injury of skin, AKI (resolved), acute blood loss anemia.  After pharmacy consult was ordered, we were contacted by Dr. Myrtie Hawk. Pt has been tachypneic and tachycardic; a CT has been ordered to rule out PE. If PE is ruled out, Dr. Myrtie Hawk stated that team may elect to wait until tomorrow to restart warfarin. Since pt has been off warfarin for ~ 1 week, plan would be to bridge with heparin when restarting  warfarin.   Goal of Therapy:  INR 2-3 Monitor platelets by anticoagulation protocol: Yes   Plan:  Medical team will review results of CT to rule out PE and write further anticoagulation orders for pharmacy consult  Gillermina Hu, PharmD, BCPS, [facility] Clinical Pharmacist 07/05/2019,5:05 PM   Addendum: CT negative for PE, will start heparin bridge to warfarin for atrial fibrillation.  Plan: Start heparin drip at 1100 units/hr Check HL in 6 - 8 hours Daily heparin levels and CBC Monitor for signs and symptoms of bleeding  Alanda Slim, PharmD, [facility] Clinical Pharmacist Please see AMION for all Pharmacists' Contact Phone Numbers 07/05/2019, 10:17 PM

## 2019-07-05 NOTE — Progress Notes (Signed)
  Date: 07/05/2019  Patient name: Luis Lyons  Medical record number: [id number]  Date of birth: [birth year]        I have seen and evaluated this patient and I have discussed the plan of care with the house staff. Please see their note for complete details. I concur with their findings with the following additions/corrections: [name] was seen this morning on team rounds.  He has developed tachypnea up to the 30s over the past 72 hours.  His heart rate has also trended up over the past day or 2.  He has A. fib and his Coreg and warfarin have been held.  His Coreg has been started back for rate control but due to the tachypnea and his daughter stating that he does not look at baseline, we are going to further investigate.  He has had a portable chest x-ray which I independently reviewed which is a 1 view AP, rotated, shows a little bit of streakiness in the bases bilaterally which appears to be old.  We will proceed with a CT scan with contrast to assess for PE.  He is bedbound, has been off his warfarin, and his last INR was 1.3 on September 28.  Bartholomew Crews, MD 07/05/2019, 4:38 PM

## 2019-07-05 NOTE — Progress Notes (Signed)
Pt's MEWS score 2/yellow r/t RR. No acute changes noted, RR has been elevated during previous shifts. Pt denies SOB or dyspnea. Will continue to monitor.     07/05/19 0828  Vitals  Temp 98.5 F (36.9 C)  Temp Source Oral  BP (!) 154/69  MAP (mmHg) 91  BP Location Left Arm  BP Method Automatic  Patient Position (if appropriate) Lying  Pulse Rate 87  ECG Heart Rate 87  Resp (!) 33  Oxygen Therapy  SpO2 94 %  O2 Device Room Air  MEWS Score  MEWS RR 2  MEWS Pulse 0  MEWS Systolic 0  MEWS LOC 0  MEWS Temp 0  MEWS Score 2  MEWS Score Color Yellow  MEWS Assessment  Is this an acute change? No

## 2019-07-05 NOTE — Progress Notes (Addendum)
   Subjective:  Mr. Winey was seen in bed this morning. He states that he is making stool and urinating. He is wondering when he can go back to his SNF. We stated that we need to add back his warfarin to make sure that he does not have any bleeding events since he appeared with a GI bleed. He agreed with the plan. All questions and concerns were addressed.   Objective:  Vital signs in last 24 hours: Vitals:   07/05/19 1051 07/05/19 1121 07/05/19 1151 07/05/19 1321  BP: 136/69 127/68 130/62 (!) 149/80  Pulse: 97 93 100   Resp: (!) 28 (!) 27 (!) 30   Temp:   98 F (36.7 C)   TempSrc:   Oral   SpO2: 94% 92% 97%   Weight:      Height:       Assessment/Plan:  Principal Problem:   SBO (small bowel obstruction) (HCC) Active Problems:   Pressure injury of skin   AKI (acute kidney injury) ([place])   Acute blood loss anemia   Upper GI bleed  SBO:  - Patient passed gas and had a bowel movement last night, but has not had either today. His bowel sounds are active. He is eating food without vomiting or nausea. A COVID test has been ordered for him to return to SNF.    - GI: Continue IV Protonix if diet advances, consider PO per gastroenterology. We appreciate their assistance. We appreciate their assistance.   - 0.9% NaCl infusion 38ml/hr ordered. - Diet: Dysphagia 2 per speech and language pathology. We appreciate their assistance.    - Continue pulse oximetry and monitor vitals.  - Blood cultures: NGTD  A Fib:  - Check CBC - Start Warfarin  - Continue to trend CBC - Continue Coreg  AKI: Resolved Dispo: Anticipated discharge pending medical course.   Maudie Mercury, MD 07/05/2019, 2:17 PM Pager: [phone]

## 2019-07-05 NOTE — Progress Notes (Signed)
Daughter called and stated that her father was "not his self" and he was "talking out of his head". When assessed he didn't exhibit any signs of altered mental status.  Daughter stated that she had to explain some things that has been told to him before. She stated that she spoke to 7a-7p nurse and she was going to put a note in and/or page the doctor. Would like a follow call from someone concerning the issue if possible.

## 2019-07-05 NOTE — Progress Notes (Signed)
Patient' s MEWS was yellow for respiration rate above 20 since previous shift. MD aware as per day shift nurse. No distress noted this time. Patient is alert and oriented. Will continue to monitor.

## 2019-07-05 NOTE — Progress Notes (Signed)
[facility] called inquiring when the CT Angio was going to take place. CT was called RN was told that they are a little back up and will get to it as soon as possible tonight. Heparin is schedule to be hung will hang once the pt has return for CT. Will be passed to the RN that will be taking over that pt care. [facility] contacted and updated.  [facility] stated that it was ok to hang Heparin once pt is back.

## 2019-07-05 NOTE — TOC Progression Note (Signed)
Transition of Care [facility]) - Progression Note    Patient Details  Name: Luis Lyons MRN: [id number] Date of Birth: March 22, 1941  Transition of Care [facility]) CM/SW [place], LCSW Phone Number: 07/05/2019, 10:22 AM  Clinical Narrative:    CSW notes patient's COVID still needs to be collected. Will continue to follow.    Expected Discharge Plan: [place] Barriers to Discharge: Continued Medical Work up  Expected Discharge Plan and Services Expected Discharge Plan: [place] In-house Referral: Clinical Social Work Discharge Planning Services: NA Post Acute Care Choice: [place] Living arrangements for the past 2 months: [place]                 DME Arranged: N/A DME Agency: NA       HH Arranged: NA           Social Determinants of Health (SDOH) Interventions    Readmission Risk Interventions Readmission Risk Prevention Plan 07/04/2019  Transportation Screening Complete  PCP or Specialist Appt within 5-7 Days Complete  Home Care Screening Complete  Medication Review (RN CM) Complete

## 2019-07-05 NOTE — Progress Notes (Signed)
Tele called to alert Pt was in AFib, HR 140s. Pt in bed eating breakfast. Asked Pt how he felt, stated he felt fine. No acute distress noted. First Resident paged at [phone] and 2nd Resident paged at [phone]. Called returned at 0949. Informed MD Pt in Afib with HR 120-140s. Will await any new orders and continue to monitor.

## 2019-07-05 NOTE — Progress Notes (Signed)
Chaplain responded to request for grief support due to Cranford's brother now being in Hospice. Jaaden did not seem to fully comprehend the idea that his brother may be dying. Yuki's reply to the chaplain was, "I will talk to him when I get home." Lydell was eating breakfast at the time. Elba was also having trouble coughing up secretions. Chaplain asked nursing staff to help Manveer slide up in bed so that he would have an easier time with eating. Raghav said he had not further spiritual needs at this time. Chaplain remains available per request.    Chaplain Resident, Evelene Croon, M Div Pager # [phone] personal Pager # [phone] on-call

## 2019-07-06 LAB — CBC
HCT: 24.9 % — ABNORMAL LOW (ref 39.0–52.0)
Hemoglobin: 8.2 g/dL — ABNORMAL LOW (ref 13.0–17.0)
MCH: 29 pg (ref 26.0–34.0)
MCHC: 32.9 g/dL (ref 30.0–36.0)
MCV: 88 fL (ref 80.0–100.0)
Platelets: 353 10*3/uL (ref 150–400)
RBC: 2.83 MIL/uL — ABNORMAL LOW (ref 4.22–5.81)
RDW: 15.6 % — ABNORMAL HIGH (ref 11.5–15.5)
WBC: 14 10*3/uL — ABNORMAL HIGH (ref 4.0–10.5)
nRBC: 0 % (ref 0.0–0.2)

## 2019-07-06 LAB — HEPARIN LEVEL (UNFRACTIONATED)
Heparin Unfractionated: <0.1 IU/mL — ABNORMAL LOW (ref 0.30–0.70)
Heparin Unfractionated: <0.1 IU/mL — ABNORMAL LOW (ref 0.30–0.70)
Heparin Unfractionated: 0.13 IU/mL — ABNORMAL LOW (ref 0.30–0.70)

## 2019-07-06 LAB — PROTIME-INR
INR: 1.3 — ABNORMAL HIGH (ref 0.8–1.2)
Prothrombin Time: 15.8 seconds — ABNORMAL HIGH (ref 11.4–15.2)

## 2019-07-06 LAB — NOVEL CORONAVIRUS, NAA (HOSP ORDER, SEND-OUT TO REF LAB; TAT 18-24 HRS): SARS-CoV-2, NAA: NOT DETECTED

## 2019-07-06 MED ORDER — DM-GUAIFENESIN ER 30-600 MG PO TB12
1.0000 | ORAL_TABLET | Freq: Two times a day (BID) | ORAL | Status: DC | PRN
  Administered 2019-07-06: 1 via ORAL
  Filled 2019-07-06 (×2): qty 1

## 2019-07-06 MED ORDER — IOHEXOL 350 MG/ML SOLN
75.0000 mL | Freq: Once | INTRAVENOUS | Status: AC | PRN
  Administered 2019-07-06: 75 mL via INTRAVENOUS

## 2019-07-06 MED ORDER — WARFARIN - PHARMACIST DOSING INPATIENT
Freq: Every day | Status: DC
  Administered 2019-07-06 – 2019-07-07 (×2)

## 2019-07-06 MED ORDER — WARFARIN SODIUM 2.5 MG PO TABS
2.5000 mg | ORAL_TABLET | Freq: Once | ORAL | Status: AC
  Administered 2019-07-06: 2.5 mg via ORAL
  Filled 2019-07-06: qty 1

## 2019-07-06 NOTE — Progress Notes (Addendum)
ANTICOAGULATION CONSULT NOTE - Initial Consult  Pharmacy Consult for Warfarin Indication: atrial fibrillation  Allergies  Allergen Reactions  . Bactrim [Sulfamethoxazole-Trimethoprim] Other (See Comments)    Listed on [facility]    Patient Measurements: Height: 6\' 1"  (185.4 cm) Weight: 172 lb 13.5 oz (78.4 kg) IBW/kg (Calculated) : 79.9  Vital Signs: Temp: 98.5 F (36.9 C) (10/03 0758) Temp Source: Axillary (10/03 0758) BP: 140/87 (10/03 0820) Pulse Rate: 90 (10/03 0820)  Labs: Recent Labs    07/04/19 0718 07/05/19 0900 07/06/19 0221 07/06/19 0758  HGB  --  8.5* 8.2*  --   HCT  --  26.8* 24.9*  --   PLT  --  400 353  --   LABPROT  --   --   --  15.8*  INR  --   --   --  1.3*  HEPARINUNFRC  --   --  <0.10* <0.10*  CREATININE 0.78 0.74  --   --     Estimated Creatinine Clearance: 85.8 mL/min (by C-G formula based on SCr of 0.74 mg/dL).   Medical History: Past Medical History:  Diagnosis Date  . Abdominal hernia   . Cancer ([place])   . Constipation   . Cough   . Depression   . Dry mouth   . Hard of hearing   . Hypertension   . Muscle weakness   . Stroke ([place])   . Urinary incontinence   . Vitamin D deficiency   . Vomiting   . 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 Pt was on warfarin 2.5 mg PO daily at home for a fib; warfarin has been held (~1 week) this admission, due to GI bleed. Today's INR was 1.3 . H/H 8.2/24.9 (stable), plts wnl. CT negative for PE. Pharmacy is consulted for bridge with heaprin to restart pt's warfarin, with goal of discharge back to SNF.   Heparin level was sub-therapeutic at < 0.10; however, heparin was not started until 10/3 at [facility] and lab was drawn 10/3 at 0334. Heparin level was redrawn at 0800 but remains sub-therapeutic at < 0.10. RN reports no line issue  or concern for bleeding.   Goal of Therapy:  INR 2-3 Heparin level 0.3-0.7 units/ml Monitor platelets by anticoagulation protocol: Yes   Plan:  Increase heparin drip to 1300 units/hr Check HL in 6, closer monitoring due to recent GI bleed Restart home Warfarin dose at 2.5 mg Daily INR, heparin levels and CBC Monitor for signs and symptoms of bleeding  Acey Lav, PharmD  PGY1 [place] Resident [phone] 07/06/2019,9:12 AM

## 2019-07-06 NOTE — Progress Notes (Signed)
1130 Transporter here to take pt for CT/ Angio scan.  0007 Pt returned to floor at this time, telemetry resumed, will cont to monitor.  0025 Heparin started at this time@ 11 ml/hr, noted IV site infusing NS is leaking, IV team was consulted to start a new line.  [id number] IV team here to start new IV in R wrist.

## 2019-07-06 NOTE — Progress Notes (Addendum)
   Subjective:  Patient was seen and evaluated at bedside on morning rounds. He is more alert today denies shortness of breath or chest pain. No N/V, no bleeding.  Objective:  Vital signs in last 24 hours: Vitals:   07/05/19 1852 07/05/19 1931 07/06/19 0023 07/06/19 0518  BP: 136/61 139/67 (!) 169/71 (!) 157/93  Pulse: 92 92 89 86  Resp: (!) 22 (!) 33 (!) 28 (!) 30  Temp:  98.5 F (36.9 C) 98.1 F (36.7 C) 97.8 F (36.6 C)  TempSrc:  Oral Oral Oral  SpO2: 94% 95% 98% 96%  Weight:      Height:       Physical Exam  Constitutional:Pleasant gentleman, No acute distress.  Head: Jaw deformity 2/2 surgery Cardiovascular: now regular, normal rate, no murmur Respiratory: Diffuse rhonchi GI: Soft. Bowel sounds are normal. No distension. There is mild non tenderness. (resolved on second exam)  Assessment/Plan:  Principal Problem:   SBO (small bowel obstruction) (HCC) Active Problems:   Pressure injury of skin   AKI (acute kidney injury) ([place])   Acute blood loss anemia   Upper GI bleed  SBO:  With coffee ground emesis on arrival that resolved  Improved. Has had BM and passed gas. Tolerates PO intake well. A COVID test has been ordered for him to return to SNF.    - Continue IV Protonix if diet advances, consider PO per gastroenterology. We appreciate their assistance.    - 0.9% NaCl infusion 71ml/hr ordered. - Diet: Dysphagia 2 per speech and language pathology. We appreciate their assistance.    - Continue pulse oximetry and monitor vitals.  - Blood cultures: NGTD -Hb stable. No further GIB -Givving PRN Mucinex  A Fib:  Had RVR yesterday 10/2. Also had worsening of tachypnea. CT angio chest without PE. CXR with old mass/infiltration.   Rate is now controlled and has regular rhythm now on tele  -Starting bridge to Warfarin (Started IV heparin last night and resuming warfarin) - Continue to trend CBC - Continue Coreg - Check CBC  AKI: Resolved Leukocytosis: unknown  source. Afebrile. Trending down off Ab Dispo: Anticipated discharge pending medical course.   Dewayne Hatch, MD 07/06/2019, 6:48 AM Pager: [phone]

## 2019-07-06 NOTE — Progress Notes (Signed)
[place] for Warfarin, heparin  Indication: atrial fibrillation  Allergies  Allergen Reactions  . Bactrim [Sulfamethoxazole-Trimethoprim] Other (See Comments)    Listed on [facility]    Patient Measurements: Height: 6\' 1"  (185.4 cm) Weight: 172 lb 13.5 oz (78.4 kg) IBW/kg (Calculated) : 79.9  Vital Signs: Temp: 99.2 F (37.3 C) (10/03 1659) Temp Source: Oral (10/03 1615) BP: 130/65 (10/03 1615) Pulse Rate: 78 (10/03 1659)  Labs: Recent Labs    07/04/19 0718 07/05/19 0900 07/06/19 0221 07/06/19 0758 07/06/19 1820  HGB  --  8.5* 8.2*  --   --   HCT  --  26.8* 24.9*  --   --   PLT  --  400 353  --   --   LABPROT  --   --   --  15.8*  --   INR  --   --   --  1.3*  --   HEPARINUNFRC  --   --  <0.10* <0.10* 0.13*  CREATININE 0.78 0.74  --   --   --     Estimated Creatinine Clearance: 85.8 mL/min (by C-G formula based on SCr of 0.74 mg/dL).   Medical History: Past Medical History:  Diagnosis Date  . Abdominal hernia   . Cancer ([place])   . Constipation   . Cough   . Depression   . Dry mouth   . Hard of hearing   . Hypertension   . Muscle weakness   . Stroke ([place])   . Urinary incontinence   . Vitamin D deficiency   . Vomiting   . 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 Pt was on warfarin 2.5 mg PO daily at home for a fib; warfarin has been held (~1 week) this admission, due to GI bleed. Pharmacy is consulted for bridge with heaprin to restart pt's warfarin, with goal of discharge back to SNF.  -heparin level below goal after increase to 1300 units/hr   Goal of Therapy:  INR 2-3 Heparin level 0.3-0.7 units/ml Monitor platelets by anticoagulation protocol: Yes   Plan: = No bolus due to recent GIB Increase heparin drip to 1500 units/hr Daily INR,  heparin levels and CBC  Hildred Laser, PharmD Clinical Pharmacist **Pharmacist phone directory can now be found on [email or web address] ([email or web address]).  Listed under [place].

## 2019-07-07 DIAGNOSIS — R918 Other nonspecific abnormal finding of lung field: Secondary | ICD-10-CM

## 2019-07-07 DIAGNOSIS — I5020 Unspecified systolic (congestive) heart failure: Secondary | ICD-10-CM

## 2019-07-07 LAB — BASIC METABOLIC PANEL
Anion gap: 9 (ref 5–15)
BUN: 9 mg/dL (ref 8–23)
CO2: 23 mmol/L (ref 22–32)
Calcium: 8.8 mg/dL — ABNORMAL LOW (ref 8.9–10.3)
Chloride: 105 mmol/L (ref 98–111)
Creatinine, Ser: 0.72 mg/dL (ref 0.61–1.24)
GFR calc Af Amer: >60 mL/min (ref >60)
GFR calc non Af Amer: >60 mL/min (ref >60)
Glucose, Bld: 107 mg/dL — ABNORMAL HIGH (ref 70–99)
Potassium: 3.7 mmol/L (ref 3.5–5.1)
Sodium: 137 mmol/L (ref 135–145)

## 2019-07-07 LAB — CBC
HCT: 25.6 % — ABNORMAL LOW (ref 39.0–52.0)
Hemoglobin: 8 g/dL — ABNORMAL LOW (ref 13.0–17.0)
MCH: 28 pg (ref 26.0–34.0)
MCHC: 31.3 g/dL (ref 30.0–36.0)
MCV: 89.5 fL (ref 80.0–100.0)
Platelets: 364 10*3/uL (ref 150–400)
RBC: 2.86 MIL/uL — ABNORMAL LOW (ref 4.22–5.81)
RDW: 15.6 % — ABNORMAL HIGH (ref 11.5–15.5)
WBC: 12 10*3/uL — ABNORMAL HIGH (ref 4.0–10.5)
nRBC: 0 % (ref 0.0–0.2)

## 2019-07-07 LAB — PROTIME-INR
INR: 1.2 (ref 0.8–1.2)
Prothrombin Time: 15 seconds (ref 11.4–15.2)

## 2019-07-07 LAB — HEPARIN LEVEL (UNFRACTIONATED): Heparin Unfractionated: 0.4 IU/mL (ref 0.30–0.70)

## 2019-07-07 MED ORDER — WARFARIN SODIUM 5 MG PO TABS
5.0000 mg | ORAL_TABLET | Freq: Once | ORAL | Status: AC
  Administered 2019-07-07: 5 mg via ORAL
  Filled 2019-07-07: qty 1

## 2019-07-07 MED ORDER — LOSARTAN POTASSIUM 50 MG PO TABS
25.0000 mg | ORAL_TABLET | Freq: Every day | ORAL | Status: DC
  Administered 2019-07-07 – 2019-07-09 (×3): 25 mg via ORAL
  Filled 2019-07-07 (×3): qty 1

## 2019-07-07 MED ORDER — POTASSIUM CHLORIDE CRYS ER 20 MEQ PO TBCR
40.0000 meq | EXTENDED_RELEASE_TABLET | Freq: Once | ORAL | Status: AC
  Administered 2019-07-07: 40 meq via ORAL
  Filled 2019-07-07: qty 2

## 2019-07-07 MED ORDER — FUROSEMIDE 40 MG PO TABS: 40.0000 mg | ORAL_TABLET | ORAL | Status: DC

## 2019-07-07 MED ORDER — FUROSEMIDE 10 MG/ML IJ SOLN
40.0000 mg | Freq: Once | INTRAMUSCULAR | Status: AC
  Administered 2019-07-07: 40 mg via INTRAVENOUS
  Filled 2019-07-07: qty 4

## 2019-07-07 MED ORDER — LOSARTAN POTASSIUM 50 MG PO TABS: 25.0000 mg | ORAL_TABLET | Freq: Every day | ORAL | Status: DC

## 2019-07-07 NOTE — Progress Notes (Addendum)
[place] for Warfarin Indication: atrial fibrillation  Allergies  Allergen Reactions  . Bactrim [Sulfamethoxazole-Trimethoprim] Other (See Comments)    Listed on [facility]    Patient Measurements: Height: 6\' 1"  (185.4 cm) Weight: 172 lb 13.5 oz (78.4 kg) IBW/kg (Calculated) : 79.9  Vital Signs: Temp: 98.1 F (36.7 C) (10/04 0816) Temp Source: Axillary (10/04 0816) BP: 163/75 (10/04 0816) Pulse Rate: 88 (10/04 0816)  Labs: Recent Labs    07/05/19 0900  07/06/19 0221 07/06/19 0758 07/06/19 1820 07/07/19 0346  HGB 8.5*  --  8.2*  --   --  8.0*  HCT 26.8*  --  24.9*  --   --  25.6*  PLT 400  --  353  --   --  364  LABPROT  --   --   --  15.8*  --  15.0  INR  --   --   --  1.3*  --  1.2  HEPARINUNFRC  --    < > <0.10* <0.10* 0.13* 0.40  CREATININE 0.74  --   --   --   --  0.72   < > = values in this interval not displayed.    Estimated Creatinine Clearance: 85.8 mL/min (by C-G formula based on SCr of 0.72 mg/dL).   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 Pt was on warfarin 2.5 mg PO daily at home for a fib; warfarin has been held (~1 week) this admission, due to GI bleed. Pharmacy is consulted for to restart pt's warfarin, with goal of discharge back to SNF. Hbg 8.0/Hct 25.6 (Low but stable), plt 364 (wnl).  Goal of Therapy:  INR 2-3 Monitor platelets by anticoagulation protocol: Yes   Plan:  Discontinue heparin drip at 1500 units/hr Give Warfarin dose at 5 mg PO x1 (Home dose 2.5 mg daily) Daily INR, CBC, and sx/s of bleeding  Thanks for allowing pharmacy to be a part of this patient's care.  Acey Lav, PharmD  PGY1 Acute Care Pharmacy Resident [phone] Clinical Pharmacist

## 2019-07-07 NOTE — Progress Notes (Addendum)
   Subjective:  Patient was seen and evaluated at bedside on morning rounds. He is doing well, denies shortness of breath or chest pain. No N/V, no abdominal pain, no bleeding.  Objective:  Vital signs in last 24 hours: Vitals:   07/06/19 1659 07/06/19 2015 07/07/19 0012 07/07/19 0359  BP:      Pulse: 78     Resp: (!) 31     Temp: 99.2 F (37.3 C) 98 F (36.7 C) 97.8 F (36.6 C) 98 F (36.7 C)  TempSrc:  Oral Oral Oral  SpO2: 96%     Weight:      Height:       Physical Exam  Constitutional:Pleasant gentleman, No acute distress.      Head: Jaw deformity post surgery Cardiovascular: now regular, normal rate, no murmur Respiratory: Course breath sound today, no respiratory distress, diffuse rhonchi, mild bibasilar crackle GI: Soft. Bowel sounds are normal. No distension. No tenderness  Assessment/Plan:  Principal Problem:   SBO (small bowel obstruction) (HCC) Active Problems:   Pressure injury of skin   AKI (acute kidney injury) ([place])   Acute blood loss anemia   Upper GI bleed  SBO:  With coffee ground emesis on arrival that resolved  Improved. Has had BM and passed gas. Tolerates PO intake well. A COVID test has been ordered for him to return to SNF-> COVID negative. Appreciate SW follow up     - Continue IV Protonix if diet advances, consider PO per gastroenterology. We appreciate their assistance.    - 0.9% NaCl infusion 7ml/hr ordered. - Diet: Dysphagia 2 per speech and language pathology. We appreciate their assistance.    - Continue pulse oximetry and monitor vitals.  - Blood cultures: NGTD -Hb stable. No further GIB -Giving PRN Mucinex  A Fib:  Had RVR yesterday 10/2. Also had worsening of tachypnea. CT angio chest without PE. CXR with old? mass/infiltration.  ate is now controlled and has regular rhythm now on tele On Warfarin at home that held due to gi bleeding. Resumed Ac 10/2 No bleeding on anticoagulation. Hb stable. Plt stable.   - Stopping  Heparin. (Does not need bridge for ppx) - Continue Warfarin  - Continue to trend CBC - Continue Coreg - Check CBC  HFrEF: Home Lasix and ACE inh was held initialy for AKI, Some crackle on exam today. Last CXR and CT 2 days ago with trace edema. Cr has been normal now. -IV Lasix 40 mg once -No IV fluid -Strict I and Os -Weight daily -BMP daily  AKI: Resolved -Resume home ACE inhibtor -stopped IV fluid  Leukocytosis with unknown source. Afebrile. Trending down off Ab  Dispo: Anticipated discharge pending medical course.   Dewayne Hatch, MD 07/07/2019, 6:43 AM Pager: [phone]

## 2019-07-07 NOTE — Progress Notes (Signed)
  Date: 07/07/2019  Patient name: Luis Lyons  Medical record number: [id number]  Date of birth: Feb 24, 1941        I have seen and evaluated this patient and I have discussed the plan of care with the house staff. Please see their note for complete details. I concur with their findings with the following additions/corrections: Mr. Mccredie was seen this morning on team rounds.  He denies any respiratory symptoms but I agree that he had bibasilar crackles.  Wts and I's and O's are not accurate.  He has been on IV fluids due to AKI which is now resolved.  His EF recorded in July of this year was 35 to 40% so he likely has volume overload and I agree with the plans to stop IV fluids, give 1 dose of IV Lasix 40 mg, follow weights and I/O.   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  He did have a leukocytosis with a max WBC of 40.5 on September 27.  He was on cefepime and vancomycin, not for a pulmonary source but covering GI concerns for a total of 4 days.  He has been afebrile and his leukocytosis has continued to trend downwards even off antibiotics.  All culture data has been negative.  The only current signs or symptoms of infection he has is tachycardia which could be due to pulmonary edema.  We will treat his pulmonary edema with IV Lasix.  Will need to know more about who is making his medical decisions from Dr. Gilford Rile who has cared for him since admission to determine further to wait and with repeat chest x-ray in 6 weeks or attempt biopsy.  Bartholomew Crews, MD 07/07/2019, 3:24 PM

## 2019-07-07 NOTE — Progress Notes (Signed)
[place] for Warfarin, heparin  Indication: atrial fibrillation  Allergies  Allergen Reactions  . Bactrim [Sulfamethoxazole-Trimethoprim] Other (See Comments)    Listed on [facility]    Patient Measurements: Height: 6\' 1"  (185.4 cm) Weight: 172 lb 13.5 oz (78.4 kg) IBW/kg (Calculated) : 79.9  Vital Signs: Temp: 98 F (36.7 C) (10/04 0359) Temp Source: Oral (10/04 0359) Pulse Rate: 78 (10/03 1659)  Labs: Recent Labs    07/04/19 0718  07/05/19 0900  07/06/19 0221 07/06/19 0758 07/06/19 1820 07/07/19 0346  HGB  --    < > 8.5*  --  8.2*  --   --  8.0*  HCT  --   --  26.8*  --  24.9*  --   --  25.6*  PLT  --   --  400  --  353  --   --  364  LABPROT  --   --   --   --   --  15.8*  --  15.0  INR  --   --   --   --   --  1.3*  --  1.2  HEPARINUNFRC  --   --   --    < > <0.10* <0.10* 0.13* 0.40  CREATININE 0.78  --  0.74  --   --   --   --  0.72   < > = values in this interval not displayed.    Estimated Creatinine Clearance: 85.8 mL/min (by C-G formula based on SCr of 0.72 mg/dL).   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 Pt was on warfarin 2.5 mg PO daily at home for a fib; warfarin has been held (~1 week) this admission, due to GI bleed. Pharmacy is consulted for bridge with heparin to restart pt's warfarin, with goal of discharge back to SNF.   Heparin level 0.40 units/ml this am  Goal of Therapy:  INR 2-3 Heparin level 0.3-0.7 units/ml Monitor platelets by anticoagulation protocol: Yes   Plan:  Continue heparin drip at 1500 units/hr Daily INR, heparin levels and CBC  Thanks for allowing pharmacy to be a part of this patient's care.  Excell Seltzer, PharmD Clinical Pharmacist

## 2019-07-08 LAB — BASIC METABOLIC PANEL
Anion gap: 9 (ref 5–15)
BUN: 9 mg/dL (ref 8–23)
CO2: 24 mmol/L (ref 22–32)
Calcium: 9.2 mg/dL (ref 8.9–10.3)
Chloride: 105 mmol/L (ref 98–111)
Creatinine, Ser: 0.9 mg/dL (ref 0.61–1.24)
GFR calc Af Amer: >60 mL/min (ref >60)
GFR calc non Af Amer: >60 mL/min (ref >60)
Glucose, Bld: 130 mg/dL — ABNORMAL HIGH (ref 70–99)
Potassium: 3.6 mmol/L (ref 3.5–5.1)
Sodium: 138 mmol/L (ref 135–145)

## 2019-07-08 LAB — CBC
HCT: 25.3 % — ABNORMAL LOW (ref 39.0–52.0)
Hemoglobin: 8.1 g/dL — ABNORMAL LOW (ref 13.0–17.0)
MCH: 28.4 pg (ref 26.0–34.0)
MCHC: 32 g/dL (ref 30.0–36.0)
MCV: 88.8 fL (ref 80.0–100.0)
Platelets: 390 10*3/uL (ref 150–400)
RBC: 2.85 MIL/uL — ABNORMAL LOW (ref 4.22–5.81)
RDW: 15.4 % (ref 11.5–15.5)
WBC: 11 10*3/uL — ABNORMAL HIGH (ref 4.0–10.5)
nRBC: 0 % (ref 0.0–0.2)

## 2019-07-08 LAB — PROTIME-INR
INR: 1.2 (ref 0.8–1.2)
Prothrombin Time: 15.2 seconds (ref 11.4–15.2)

## 2019-07-08 MED ORDER — FUROSEMIDE 40 MG PO TABS
40.0000 mg | ORAL_TABLET | ORAL | Status: DC
  Administered 2019-07-08: 40 mg via ORAL
  Filled 2019-07-08: qty 1

## 2019-07-08 MED ORDER — WARFARIN SODIUM 5 MG PO TABS
5.0000 mg | ORAL_TABLET | Freq: Once | ORAL | Status: AC
  Administered 2019-07-08: 17:00:00 5 mg via ORAL
  Filled 2019-07-08: qty 1

## 2019-07-08 NOTE — Progress Notes (Signed)
  Speech Language Pathology Treatment: Dysphagia  Patient Details Name: Luis Lyons MRN: [id number] DOB: 09-23-1941 Today's Date: 07/08/2019 Time: [id number] SLP Time Calculation (min) (ACUTE ONLY): 12 min  Assessment / Plan / Recommendation Clinical Impression  Pt was encountered asleep in bed and he roused to moderate verbal and tactile stimulation.  Pt requested thin water upon SLP arrival and he consumed it via straw sip.  Pt initially required pipette, and then he was able to draw liquid through straw independently.  Pt exhibited prolonged, congested coughing following serial straw sips of thin liquid.  Pt was then seen with trials of nectar-thick liquid via straw sip and no clinical s/sx of aspiration were observed.  Pt declined all solid trials, stating the he was "not hungry".  Recommend continuation of Dysphagia 2 (fine chop) solids and nectar-thick liquid with the following precautions: 1) Small bite/sip 2) Slow rate of intake 3) Sit upright 90 degrees.  ST will continue to monitor for diet tolerance and to evaluate appropriateness for diet upgrade.     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 CT shows SBO, but delayed abd XR shows contrast progression, suggesting partial SBO. Upper GI bleed possibly due to Mallory-Weiss tear, Hgb down to 8.9 from baseline 11, but stable on repeat this afternoon at 8.9. Will recheck in am, GI recommends no EGD for now unless destabilizing bleed. Tolerating full liquid diet, and medically appropriate to advance diet as tolerated. 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      SLP Plan  Continue with current plan of care       Recommendations  Diet recommendations: Dysphagia 2 (fine chop);Nectar-thick liquid Liquids provided via:  Cup;Straw Medication Administration: Whole meds with liquid Supervision: Intermittent supervision to cue for compensatory strategies Compensations: Slow rate;Small sips/bites Postural Changes and/or Swallow Maneuvers: Seated upright 90 degrees                Oral Care Recommendations: Oral care BID Follow up Recommendations: Skilled Nursing facility SLP Visit Diagnosis: Dysphagia, oropharyngeal phase (R13.12) Plan: Continue with current plan of care       Bretta Bang, M.S., [place] Office: [phone]               [place] 07/08/2019, 11:17 AM

## 2019-07-08 NOTE — Progress Notes (Signed)
  Date: 07/08/2019  Patient name: Luis Lyons  Medical record number: [id number]  Date of birth: May 29, 1941        I have seen and evaluated this patient and I have discussed the plan of care with the house staff. Please see their note for complete details. I concur with their findings with the following additions/corrections: Luis Lyons was seen this morning on team rounds.  He appeared much less respiratory distress with less tachypnea and increased work of breathing and accessory muscle usage.  We will resume his home Lasix dose orally.  Bartholomew Crews, MD 07/08/2019, 5:30 PM

## 2019-07-08 NOTE — Progress Notes (Signed)
MEWS/VS Documentation      07/08/2019 0757 07/08/2019 0800 07/08/2019 1217 07/08/2019 1554   MEWS Score:  2  1  1  2    MEWS Score Color:  Yellow  Green  Green  Yellow   Resp:  (!) 32  (!) 24  (!) 21  (!) 27   Pulse:  68  83  70  65   BP:  (!) 130/48  (!) 134/43  (!) 121/39  (!) 121/48   Temp:  98.2 F (36.8 C)  98.1 F (36.7 C)  98.6 F (37 C)  98.5 F (36.9 C)   O2 Device:  -  Room Air  Room Air  Room Air   Level of Consciousness:  -  Alert  Alert  -    Patient denies pain.  No distress noted.  Patient was asleep upon entry to the room.

## 2019-07-08 NOTE — Progress Notes (Signed)
Pt on yellow mews d/t elevated respirations which is not a change in pt condition. Provider is aware.

## 2019-07-08 NOTE — Progress Notes (Signed)
   Subjective:  Luis Lyons was seen at bedside this morning. He states that he is doing "okay." He had no complaints overnight. He denies fevers, headache, or SHOB. All questions and concerns were addressed.   Objective:  Vital signs in last 24 hours: Vitals:   07/07/19 2038 07/08/19 0432 07/08/19 0433 07/08/19 0757  BP:    (!) 130/48  Pulse:    68  Resp:    (!) 32  Temp: 97.6 F (36.4 C) 98 F (36.7 C)  98.2 F (36.8 C)  TempSrc: Oral Oral  Oral  SpO2:    93%  Weight:   79.7 kg   Height:       Physical Exam Constitutional:      General: He is not in acute distress.    Appearance: He is not toxic-appearing or diaphoretic.  HENT:     Head: Normocephalic and atraumatic.  Cardiovascular:     Rate and Rhythm: Normal rate and regular rhythm.     Pulses: Normal pulses.     Heart sounds: No murmur. No friction rub. No gallop.      Comments: History of A-Fib Pulmonary:     Effort: Pulmonary effort is normal.     Breath sounds: Normal breath sounds. No wheezing, rhonchi or rales.  Abdominal:     General: Abdomen is flat. Bowel sounds are normal.     Palpations: Abdomen is soft.     Tenderness: There is no abdominal tenderness. There is no guarding.  Neurological:     Mental Status: He is alert.  Psychiatric:        Mood and Affect: Mood normal.        Behavior: Behavior normal.     Assessment/Plan:  Principal Problem:   SBO (small bowel obstruction) (HCC) Active Problems:   Pressure injury of skin   AKI (acute kidney injury) ([place])   Acute blood loss anemia   Upper GI bleed  SBO:  With coffee ground emesis on arrival that resolved Improved. Has had BM and passed gas. Tolerates PO intake well. A COVID test has been ordered for him to return to SNF-> COVID negative.  - Continue IV Protonix if diet advances, consider PO per gastroenterology. We appreciate their assistance. - Diet: Dysphagia 2 per speech and language pathology. We appreciate their assistance.    -  Continue pulse oximetry and monitor vitals.  - Blood cultures: NGTD - Hb stable. No further GIB - Giving PRN Mucinex  A Fib:  Had RVR yesterday 10/2. Also had worsening of tachypnea. CT angio chest without PE. CXR with old? mass/infiltration.  - Rate is now controlled and has regular rhythm now on tele - On Warfarin at home that held due to gi bleeding. Resumed Ac 10/2 - No bleeding on anticoagulation. Hb stable. Plt stable.   - Continue Warfarin  - Continue to trend CBC - Continue Coreg - Check CBC  HFrEF: Home Lasix and ACE inh was held initialy for AKI, Some crackle on exam today. Last CXR and CT 2 days ago with trace edema. Cr has been normal now. - Resume home lasix -Strict I and Os -Weight daily: 79.7 -BMP daily  AKI: Resolved -Resume home ACE inhibtor  Leukocytosis with unknown source. Afebrile. Trending down off Ab  Dispo: Anticipated discharge pending medical course.   Maudie Mercury, MD 07/08/2019, 8:53 AM Pager: [phone]

## 2019-07-08 NOTE — Progress Notes (Signed)
ANTICOAGULATION CONSULT NOTE - Follow Up Consult  Pharmacy Consult for Coumadin Indication: Afib  Allergies  Allergen Reactions  . Bactrim [Sulfamethoxazole-Trimethoprim] Other (See Comments)    Listed on [facility]    Patient Measurements: Height: 6\' 1"  (185.4 cm) Weight: 175 lb 11.3 oz (79.7 kg) IBW/kg (Calculated) : 79.9  Vital Signs: Temp: 98.2 F (36.8 C) (10/05 0757) Temp Source: Oral (10/05 0757) BP: 130/48 (10/05 0757) Pulse Rate: 68 (10/05 0757)  Labs: Recent Labs    07/06/19 0221 07/06/19 0758 07/06/19 1820 07/07/19 0346 07/08/19 0258  HGB 8.2*  --   --  8.0* 8.1*  HCT 24.9*  --   --  25.6* 25.3*  PLT 353  --   --  364 390  LABPROT  --  15.8*  --  15.0 15.2  INR  --  1.3*  --  1.2 1.2  HEPARINUNFRC <0.10* <0.10* 0.13* 0.40  --   CREATININE  --   --   --  0.72  --     Estimated Creatinine Clearance: 87.2 mL/min (by C-G formula based on SCr of 0.72 mg/dL).   Assessment: Warfarin PTA (4 mg qday), LD 9/26 for Afib and CVA hx, INR 1.2  Goal of Therapy:  INR 2-3  Monitor platelets by anticoagulation protocol: Yes   Plan:  Continue Warfarin dose at 5 mg today Daily INR, s/sx of bleeding and CBC  Gaylan Gerold 07/08/2019,9:47 AM

## 2019-07-09 DIAGNOSIS — Z79899 Other long term (current) drug therapy: Secondary | ICD-10-CM

## 2019-07-09 DIAGNOSIS — Z881 Allergy status to other antibiotic agents status: Secondary | ICD-10-CM

## 2019-07-09 LAB — PROTIME-INR
INR: 1.5 — ABNORMAL HIGH (ref 0.8–1.2)
Prothrombin Time: 17.9 seconds — ABNORMAL HIGH (ref 11.4–15.2)

## 2019-07-09 LAB — CBC
HCT: 25.9 % — ABNORMAL LOW (ref 39.0–52.0)
Hemoglobin: 8.4 g/dL — ABNORMAL LOW (ref 13.0–17.0)
MCH: 28.4 pg (ref 26.0–34.0)
MCHC: 32.4 g/dL (ref 30.0–36.0)
MCV: 87.5 fL (ref 80.0–100.0)
Platelets: 385 10*3/uL (ref 150–400)
RBC: 2.96 MIL/uL — ABNORMAL LOW (ref 4.22–5.81)
RDW: 15.6 % — ABNORMAL HIGH (ref 11.5–15.5)
WBC: 10.6 10*3/uL — ABNORMAL HIGH (ref 4.0–10.5)
nRBC: 0 % (ref 0.0–0.2)

## 2019-07-09 MED ORDER — WARFARIN SODIUM 5 MG PO TABS: 5.0000 mg | ORAL_TABLET | Freq: Once | ORAL | 0 refills | Status: DC

## 2019-07-09 MED ORDER — WARFARIN SODIUM 5 MG PO TABS
5.0000 mg | ORAL_TABLET | Freq: Once | ORAL | Status: AC
  Administered 2019-07-09: 5 mg via ORAL
  Filled 2019-07-09: qty 1

## 2019-07-09 NOTE — Progress Notes (Signed)
ANTICOAGULATION CONSULT NOTE - Follow Up Consult  Pharmacy Consult for Coumadin Indication: Afib  Allergies  Allergen Reactions  . Bactrim [Sulfamethoxazole-Trimethoprim] Other (See Comments)    Listed on [facility]    Patient Measurements: Height: 6\' 1"  (185.4 cm) Weight: 175 lb 11.3 oz (79.7 kg) IBW/kg (Calculated) : 79.9  Vital Signs: Temp: 98.7 F (37.1 C) (10/06 0500) Temp Source: Oral (10/06 0500) BP: 131/50 (10/06 0305) Pulse Rate: 62 (10/06 0500)  Labs: Recent Labs    07/06/19 1820  07/07/19 0346 07/08/19 0258 07/08/19 1121 07/09/19 0415  HGB  --    < > 8.0* 8.1*  --  8.4*  HCT  --   --  25.6* 25.3*  --  25.9*  PLT  --   --  364 390  --  385  LABPROT  --   --  15.0 15.2  --  17.9*  INR  --   --  1.2 1.2  --  1.5*  HEPARINUNFRC 0.13*  --  0.40  --   --   --   CREATININE  --   --  0.72  --  0.90  --    < > = values in this interval not displayed.    Estimated Creatinine Clearance: 77.5 mL/min (by C-G formula based on SCr of 0.9 mg/dL).   Assessment: Warfarin PTA (4 mg qday), LD 9/26 for Afib and CVA hx, INR 1.5  Goal of Therapy:  INR 2-3  Monitor platelets by anticoagulation protocol: Yes   Plan:  Continue Warfarin dose at 5 mg today Daily INR, s/sx of bleeding and CBC  Gaylan Gerold 07/09/2019,8:23 AM

## 2019-07-09 NOTE — Progress Notes (Signed)
Nsg Discharge Note  Patient to be discharged back to [facility] facility. Report given to [facility]. Patient awaiting transportation from [facility] at this time. VSS.  Admit Date:  06/30/2019 Discharge date: 07/09/2019   Otho Darner to be D/C'd Nursing Home per MD order.  AVS completed.  Copy for chart, and copy for patient signed, and dated. Patient/caregiver able to verbalize understanding.  Discharge Medication: Allergies as of 07/09/2019      Reactions   Bactrim [sulfamethoxazole-trimethoprim] Other (See Comments)   Listed on MAR      Medication List    STOP taking these medications   ketoconazole 2 % cream Commonly known as: NIZORAL     TAKE these medications   acetaminophen 325 MG tablet Commonly known as: TYLENOL Take 650 mg by mouth 3 (three) times daily.   albuterol (2.5 MG/3ML) 0.083% nebulizer solution Commonly known as: PROVENTIL Take 2.5 mg by nebulization 2 (two) times daily as needed for wheezing or shortness of breath.   atorvastatin 80 MG tablet Commonly known as: LIPITOR Take 1 tablet (80 mg total) by mouth daily at 6 PM.   carvedilol 6.25 MG tablet Commonly known as: COREG Take 1 tablet (6.25 mg total) by mouth 2 (two) times daily with a meal.   chlorhexidine 0.12 % solution Commonly known as: PERIDEX 15 mLs by Mouth Rinse route 2 (two) times a day.   clopidogrel 75 MG tablet Commonly known as: PLAVIX Take 1 tablet (75 mg total) by mouth daily.   coal tar 0.5 % shampoo Commonly known as: NEUTROGENA T-GEL Apply 1 application topically 2 (two) times a week. Tuesday and Friday   Delsym 30 MG/5ML liquid Generic drug: dextromethorphan Take 60 mg by mouth 2 (two) times daily as needed for cough.   DentaGel 1.1 % Gel dental gel Generic drug: sodium fluoride Place 1 application onto teeth 2 (two) times daily.   diphenhydrAMINE 25 mg capsule Commonly known as: BENADRYL Take 25 mg by mouth every 8 (eight) hours as needed for itching.   docusate sodium  100 MG capsule Commonly known as: COLACE Take 200 mg by mouth daily.   fluticasone 50 MCG/ACT nasal spray Commonly known as: FLONASE Place 1 spray into both nostrils daily.   furosemide 40 MG tablet Commonly known as: LASIX Take 1 tablet (40 mg total) by mouth every other day.   guaifenesin 400 MG Tabs tablet Commonly known as: HUMIBID E Take 400 mg by mouth every 6 (six) hours as needed (cough/congestion). What changed: Another medication with the same name was removed. Continue taking this medication, and follow the directions you see here.   ibuprofen 200 MG tablet Commonly known as: ADVIL Take 400 mg by mouth every 6 (six) hours as needed for moderate pain.   ipratropium-albuterol 0.5-2.5 (3) MG/3ML Soln Commonly known as: DUONEB Take 3 mLs by nebulization 4 (four) times daily.   losartan 25 MG tablet Commonly known as: COZAAR Take 1 tablet (25 mg total) by mouth daily.   magnesium hydroxide 400 MG/5ML suspension Commonly known as: MILK OF MAGNESIA Take 30 mLs by mouth daily as needed for mild constipation.   nystatin powder Generic drug: nystatin Apply 1 Bottle topically every 6 (six) hours as needed (diaper changes).   ondansetron 4 MG tablet Commonly known as: ZOFRAN Take 4 mg by mouth every 6 (six) hours as needed for nausea.   oxybutynin 5 MG 24 hr tablet Commonly known as: DITROPAN-XL Take 5 mg by mouth at bedtime.   phenazopyridine 200 MG  tablet Commonly known as: PYRIDIUM Take 200 mg by mouth 3 (three) times daily as needed for pain.   polyethylene glycol 17 g packet Commonly known as: MIRALAX / GLYCOLAX Take 17 g by mouth daily.   polyvinyl alcohol 1.4 % ophthalmic solution Commonly known as: LIQUIFILM TEARS Place 1 drop into both eyes 2 (two) times daily.   RISA-BID PROBIOTIC PO Take 2 tablets by mouth 2 (two) times a day.   senna 8.6 MG tablet Commonly known as: SENOKOT Take 2 tablets by mouth at bedtime.   Simethicone 180 MG Caps Take  180 mg by mouth 2 (two) times daily as needed (gas).   spironolactone 25 MG tablet Commonly known as: ALDACTONE Take 1 tablet (25 mg total) by mouth daily.   vitamin B-12 1000 MCG tablet Commonly known as: CYANOCOBALAMIN Take 1,000 mcg by mouth daily.   warfarin 5 MG tablet Commonly known as: COUMADIN Take 1 tablet (5 mg total) by mouth one time only at 6 PM. What changed:   medication strength  how much to take  when to take this  Another medication with the same name was removed. Continue taking this medication, and follow the directions you see here.       Discharge Assessment: Vitals:   07/09/19 1207 07/09/19 1533  BP: (!) 121/54 (!) 122/49  Pulse: 64 73  Resp: 17 (!) 30  Temp: 98 F (36.7 C)   SpO2: 94% 96%   Skin clean, dry and intact without evidence of skin break down, no evidence of skin tears noted. IV catheter discontinued intact. Site without signs and symptoms of complications - no redness or edema noted at insertion site, patient denies c/o pain - only slight tenderness at site.  Dressing with slight pressure applied.  D/c Instructions-Education: Discharge instructions given to patient/family with verbalized understanding. D/c education completed with patient/family including follow up instructions, medication list, d/c activities limitations if indicated, with other d/c instructions as indicated by MD - patient able to verbalize understanding, all questions fully answered. Patient instructed to return to ED, call 911, or call MD for any changes in condition.  Patient escorted via [place], and D/C home via private auto.  Erasmo Leventhal, RN 07/09/2019 3:50 PM

## 2019-07-09 NOTE — Progress Notes (Signed)
Update provided to daughter [name].

## 2019-07-09 NOTE — Progress Notes (Signed)
   Subjective:  Luis Lyons was seen in bed this morning. He states that he is doing well. He has no complaints from overnight. He is passing flatus, having bowel movements, and having an appetite without nausea or vomiting. We spoke about the possibility of being discharged. All questions and concerns were addressed.   Objective:  Vital signs in last 24 hours: Vitals:   07/09/19 0500 07/09/19 0827 07/09/19 0900 07/09/19 1207  BP:  (!) 127/49  (!) 121/54  Pulse: 62 66 64 64  Resp: 19 (!) 32 (!) 24 17  Temp: 98.7 F (37.1 C) 97.8 F (36.6 C)  98 F (36.7 C)  TempSrc: Oral Axillary  Axillary  SpO2: 97% 96% 99% 94%  Weight:      Height:      Physical Exam Vitals signs and nursing note reviewed.  Constitutional:      General: He is not in acute distress.    Appearance: Normal appearance. He is not ill-appearing or toxic-appearing.     Comments: Elderly gentleman lying in bed comfortably. No acute distress.   HENT:     Head: Normocephalic and atraumatic.  Cardiovascular:     Rate and Rhythm: Normal rate and regular rhythm.     Pulses: Normal pulses.     Heart sounds: Normal heart sounds. No murmur. No friction rub. No gallop.   Pulmonary:     Effort: Pulmonary effort is normal.     Breath sounds: Normal breath sounds. No wheezing, rhonchi or rales.  Abdominal:     General: Abdomen is flat. Bowel sounds are normal.     Palpations: Abdomen is soft.     Tenderness: There is no abdominal tenderness.  Neurological:     Mental Status: He is alert and oriented to person, place, and time.  Psychiatric:        Mood and Affect: Mood normal.        Behavior: Behavior normal.     Assessment/Plan:  Principal Problem:   SBO (small bowel obstruction) (HCC) Active Problems:   Pressure injury of skin   AKI (acute kidney injury) ([place])   Acute blood loss anemia   Upper GI bleed  SBO:  - Continues to tolerate meals, passes flatus and feces.  - Continue Protonix. - Diet: Dysphagia 2  per speech and language pathology. We appreciate their assistance.    - Continue pulse oximetry and monitor vitals.  - Blood cultures: NGTD - Hb stable. No further GIB - Giving PRN Mucinex  A Fib:   - Rate is controlled and has regular rhythm now on tele - No bleeding on anticoagulation. Hb stable. Plt stable.  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 Last CXR and CT 2 days ago with trace edema. Cr has been normal now. - Continued furosemide.  -Strict I and Os -Weight daily: 79.7 -BMP daily  AKI: Resolved -Resumed Losartan  Leukocytosis with unknown source. Afebrile. Trending down off Ab  Dispo: Medically clear for discharge pending SNF placement.   Maudie Mercury, MD 07/09/2019, 12:59 PM Pager: [phone]

## 2019-07-09 NOTE — TOC Transition Note (Signed)
Transition of Care [facility]) - CM/SW Discharge Note   Patient Details  Name: Luis Lyons MRN: [id number] Date of Birth: 07-31-1941  Transition of Care [facility]) CM/SW Contact:  Benard Halsted, LCSW Phone Number: 07/09/2019, 1:41 PM   Clinical Narrative:    Patient will DC to: [facility] Anticipated DC date: 07/09/19 Family notified: Daughter, [name] Transport by: Corey Harold   Per MD patient ready for DC to [facility]. RN, patient, patient's family, and facility notified of DC. Discharge Summary and FL2 sent to facility. RN to call report prior to discharge [phone] Room 54). DC packet on chart. Ambulance transport requested for patient.   CSW will sign off for now as social work intervention is no longer needed. Please consult [place] again if new needs arise.  Cedric Fishman, LCSW Clinical Social Worker [phone]    Final next level of care: Skilled Nursing Facility Barriers to Discharge: No Barriers Identified   Patient Goals and CMS Choice Patient states their goals for this hospitalization and ongoing recovery are:: Return to SNF CMS Medicare.gov Compare Post Acute Care list provided to:: Patient Represenative (must comment)(Daughter) Choice offered to / list presented to : Adult Children(Daughter)  Discharge Placement   Existing PASRR number confirmed : 07/09/19          Patient chooses bed at: [facility] Patient to be transferred to facility by: [place] Name of family member notified: [name], daughter Patient and family notified of of transfer: 07/09/19  Discharge Plan and Services In-house Referral: Clinical Social Work Discharge Planning Services: NA Post Acute Care Choice: [place]          DME Arranged: N/A DME Agency: NA       HH Arranged: NA          Social Determinants of Health (SDOH) Interventions     Readmission Risk Interventions Readmission Risk Prevention Plan 07/04/2019  Transportation Screening Complete  PCP or  Specialist Appt within 5-7 Days Complete  Home Care Screening Complete  Medication Review (RN CM) Complete

## 2019-07-09 NOTE — Discharge Instructions (Signed)
Bowel Obstruction A bowel obstruction means that something is blocking the small or large bowel. The bowel is also called the intestine. It is the long tube that connects the stomach to the opening of the butt (anus). When something blocks the bowel, food and fluids cannot pass through like normal. This condition needs to be treated. Treatment depends on the cause of the problem and how bad the problem is. What are the causes? Common causes of this condition include:  Scar tissue (adhesions) from past surgery or from high-energy X-rays (radiation).  Recent surgery in the belly. This affects how food moves in the bowel.  Some diseases, such as: ? Irritation of the lining of the digestive tract (Crohn's disease). ? Irritation of small pouches in the bowel (diverticulitis).  Growths or tumors.  A bulging organ (hernia).  Twisting of the bowel (volvulus).  A foreign body.  Slipping of a part of the bowel into another part (intussusception). What are the signs or symptoms? Symptoms of this condition include:  Pain in the belly.  Feeling sick to your stomach (nauseous).  Throwing up (vomiting).  Bloating in the belly.  Being unable to pass gas.  Trouble pooping (constipation).  Watery poop (diarrhea).  A lot of belching. How is this diagnosed? This condition may be diagnosed based on:  A physical exam.  Medical history.  Imaging tests, such as X-ray or CT scan.  Blood tests.  Urine tests. How is this treated? Treatment for this condition may include:  Fluids and pain medicines that are given through an IV tube. Your doctor may tell you not to eat or drink if you feel sick to your stomach and are throwing up.  Eating a clear liquid diet for a few days.  Putting a small tube (nasogastric tube) into the stomach. This will help with pain, discomfort, and nausea by removing blocked air and fluids from the stomach.  Surgery. This may be needed if other treatments  do not work. Follow these instructions at home: Medicines  Take over-the-counter and prescription medicines only as told by your doctor.  If you were prescribed an antibiotic medicine, take it as told by your doctor. Do not stop taking the antibiotic even if you start to feel better. General instructions  Follow your diet as told by your doctor. You may need to: ? Only drink clear liquids until you start to get better. ? Avoid solid foods.  Return to your normal activities as told by your doctor. Ask your doctor what activities are safe for you.  Do not sit for a long time without moving. Get up to take short walks every 1-2 hours. This is important. Ask for help if you feel weak or unsteady.  Keep all follow-up visits as told by your doctor. This is important. How is this prevented? After having a bowel obstruction, you may be more likely to have another. You can do some things to stop it from happening again.  If you have a long-term (chronic) disease, contact your doctor if you see changes or problems.  Take steps to prevent or treat trouble pooping. Your doctor may ask that you: ? Drink enough fluid to keep your pee (urine) pale yellow. ? Take over-the-counter or prescription medicines. ? Eat foods that are high in fiber. These include beans, whole grains, and fresh fruits and vegetables. ? Limit foods that are high in fat and sugar. These include fried or sweet foods.  Stay active. Ask your doctor which exercises  are safe for you.  Avoid stress.  Eat three small meals and three small snacks each day.  Work with a Publishing rights manager (dietitian) to make a meal plan that works for you.  Do not use any products that contain nicotine or tobacco, such as cigarettes and e-cigarettes. If you need help quitting, ask your doctor. Contact a doctor if:  You have a fever.  You have chills. Get help right away if:  You have pain or cramps that get worse.  You throw up blood.  You are  sick to your stomach.  You cannot stop throwing up.  You cannot drink fluids.  You feel mixed up (confused).  You feel very thirsty (dehydrated).  Your belly gets more bloated.  You feel weak or you pass out (faint). Summary  A bowel obstruction means that something is blocking the small or large bowel.  Treatment may include IV fluids and pain medicine. You may also have a clear liquid diet, a small tube in your stomach, or surgery.  Drink clear liquids and avoid solid foods until you get better. This information is not intended to replace advice given to you by your health care provider. Make sure you discuss any questions you have with your health care provider. Document Released: 10/27/2004 Document Revised: 01/31/2018 Document Reviewed: 01/31/2018 Elsevier Patient Education  2020 [place]. Atrial Fibrillation  Atrial fibrillation is a type of heartbeat that is irregular or fast (rapid). If you have this condition, your heart beats without any order. This makes it hard for your heart to pump blood in a normal way. Having this condition gives you more risk for stroke, heart failure, and other heart problems. Atrial fibrillation may start all of a sudden and then stop on its own, or it may become a long-lasting problem. What are the causes? This condition may be caused by heart conditions, such as:  High blood pressure.  Heart failure.  Heart valve disease.  Heart surgery. Other causes include:  Pneumonia.  Obstructive sleep apnea.  Lung cancer.  Thyroid disease.  Drinking too much alcohol. Sometimes the cause is not known. What increases the risk? You are more likely to develop this condition if:  You smoke.  You are older.  You have diabetes.  You are overweight.  You have a family history of this condition.  You exercise often and hard. What are the signs or symptoms? Common symptoms of this condition include:  A feeling like your heart is  beating very fast.  Chest pain.  Feeling short of breath.  Feeling light-headed or weak.  Getting tired easily. Follow these instructions at home: Medicines  Take over-the-counter and prescription medicines only as told by your doctor.  If your doctor gives you a blood-thinning medicine, take it exactly as told. Taking too much of it can cause bleeding. Taking too little of it does not protect you against clots. Clots can cause a stroke. Lifestyle      Do not use any tobacco products. These include cigarettes, chewing tobacco, and e-cigarettes. If you need help quitting, ask your doctor.  Do not drink alcohol.  Do not drink beverages that have caffeine. These include coffee, soda, and tea.  Follow diet instructions as told by your doctor.  Exercise regularly as told by your doctor. General instructions  If you have a condition that causes breathing to stop for a short period of time (apnea), treat it as told by your doctor.  Keep a healthy weight. Do not  use diet pills unless your doctor says they are safe for you. Diet pills may make heart problems worse.  Keep all follow-up visits as told by your doctor. This is important. Contact a doctor if:  You notice a change in the speed, rhythm, or strength of your heartbeat.  You are taking a blood-thinning medicine and you see more bruising.  You get tired more easily when you move or exercise.  You have a sudden change in weight. Get help right away if:   You have pain in your chest or your belly (abdomen).  You have trouble breathing.  You have blood in your vomit, poop, or pee (urine).  You have any signs of a stroke. "BE FAST" is an easy way to remember the main warning signs: ? B - Balance. Signs are dizziness, sudden trouble walking, or loss of balance. ? E - Eyes. Signs are trouble seeing or a change in how you see. ? F - Face. Signs are sudden weakness or loss of feeling in the face, or the face or eyelid  drooping on one side. ? A - Arms. Signs are weakness or loss of feeling in an arm. This happens suddenly and usually on one side of the body. ? S - Speech. Signs are sudden trouble speaking, slurred speech, or trouble understanding what people say. ? T - Time. Time to call emergency services. Write down what time symptoms started.  You have other signs of a stroke, such as: ? A sudden, very bad headache with no known cause. ? Feeling sick to your stomach (nausea). ? Throwing up (vomiting). ? Jerky movements you cannot control (seizure). These symptoms may be an emergency. Do not wait to see if the symptoms will go away. Get medical help right away. Call your local emergency services (911 in the [place] Do not drive yourself to the hospital. Summary  Atrial fibrillation is a type of heartbeat that is irregular or fast (rapid).  You are at higher risk of this condition if you smoke, are older, have diabetes, or are overweight.  Follow your doctor's instructions about medicines, diet, exercise, and follow-up visits.  Get help right away if you think that you have signs of a stroke. This information is not intended to replace advice given to you by your health care provider. Make sure you discuss any questions you have with your health care provider. Document Released: 06/28/2008 Document Revised: 11/23/2017 Document Reviewed: 11/10/2017 Elsevier Patient Education  2020 [employer].

## 2019-07-09 NOTE — Progress Notes (Signed)
  Date: 07/09/2019  Patient name: Luis Lyons  Medical record number: [id number]  Date of birth: Jun 27, 1941        I have seen and evaluated this patient and I have discussed the plan of care with the house staff. Please see their note for complete details. I concur with their findings with the following additions/corrections: Stable for D/C SNF. 6 week CXR to F/U abnl.  Bartholomew Crews, MD 07/09/2019, 6:30 PM

## 2019-07-11 ENCOUNTER — Other Ambulatory Visit: Payer: Self-pay | Admitting: *Deleted

## 2019-07-11 ENCOUNTER — Telehealth: Payer: Self-pay | Admitting: *Deleted

## 2019-07-11 MED ORDER — WARFARIN SODIUM 5 MG PO TABS: 5.0000 mg | ORAL_TABLET | Freq: Every day | ORAL | 0 refills | Status: AC

## 2019-07-11 NOTE — Telephone Encounter (Signed)
[facility] called to verify script for coumadin 5mg  daily at 6 pm #30

## 2019-07-11 NOTE — Telephone Encounter (Signed)
Original script stated 1 at 6pm 1 day only, triage gave VO 1 tablet daily at 6pm #30 no refills also changed script on medlist please review

## 2019-07-11 NOTE — Telephone Encounter (Signed)
Thank you for reaching out, I agree with this order set.

## 2019-07-25 ENCOUNTER — Non-Acute Institutional Stay: Payer: Medicare Other | Admitting: Internal Medicine

## 2019-07-26 ENCOUNTER — Other Ambulatory Visit: Payer: Self-pay

## 2019-09-10 ENCOUNTER — Encounter: Payer: Self-pay | Admitting: *Deleted

## 2019-09-10 NOTE — Telephone Encounter (Signed)
This encounter was created in error - please disregard.

## 2019-11-04 DEATH — deceased

## 2020-08-08 IMAGING — RF DG NASO G TUBE PLC W/FL W/RAD
2 series · 2 of 2 positions shown · IV contrast (agent unspecified)
Comparison: 09/02/2018

CLINICAL DATA: NG tube placement.

EXAM:
NASO G TUBE PLACEMENT WITH FL AND WITH RAD
CONTRAST:  Water-soluble contrast
FLUOROSCOPY TIME:  Fluoroscopy Time:  5 minutes 48 seconds
Radiation Exposure Index (if provided by the fluoroscopic device):
Number of Acquired Spot Images: 2

[Series 2: fluoro_iodine_singleshot_bw · 0.17mm/px · 1 of 1 slices shown (1 of 2)]
[im 1/1]
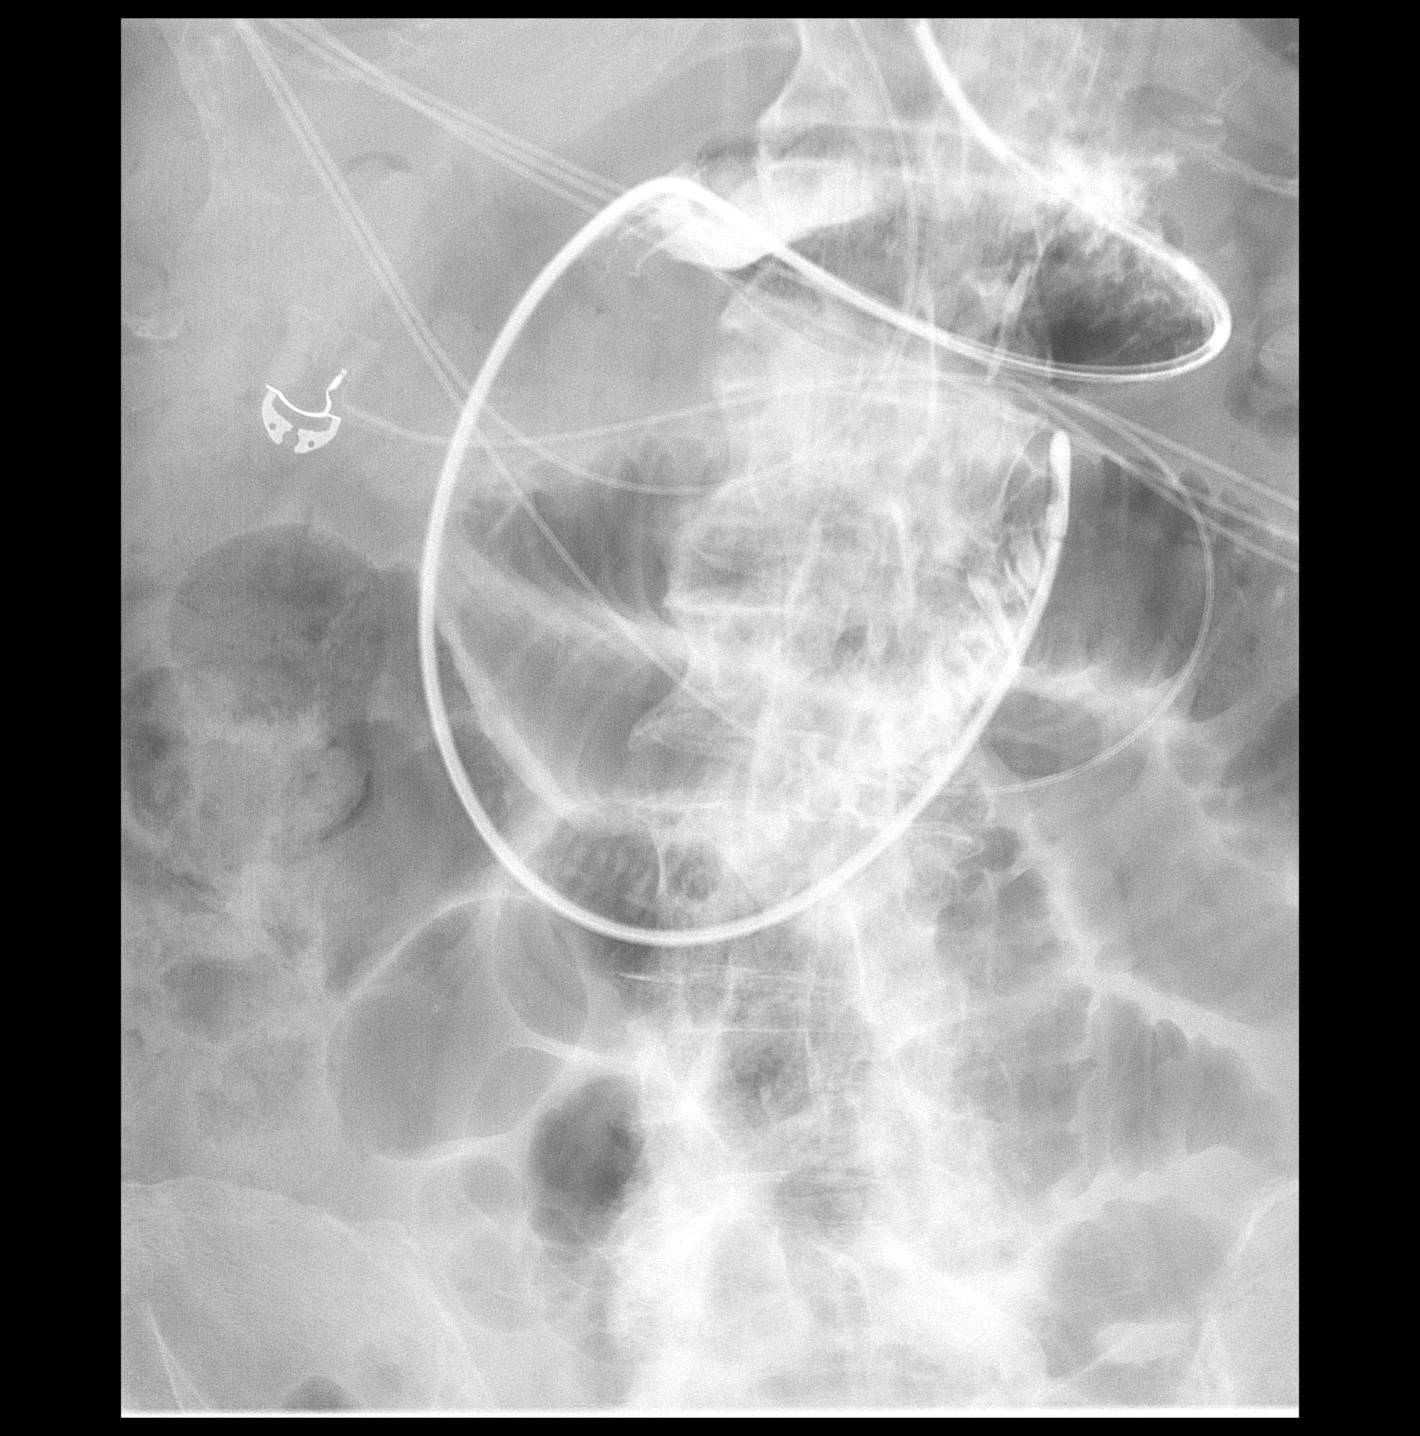

[Series 3: fluoro_iodine_singleshot_bw · 0.17mm/px · 1 of 1 slices shown (2 of 2)]
[im 1/1]
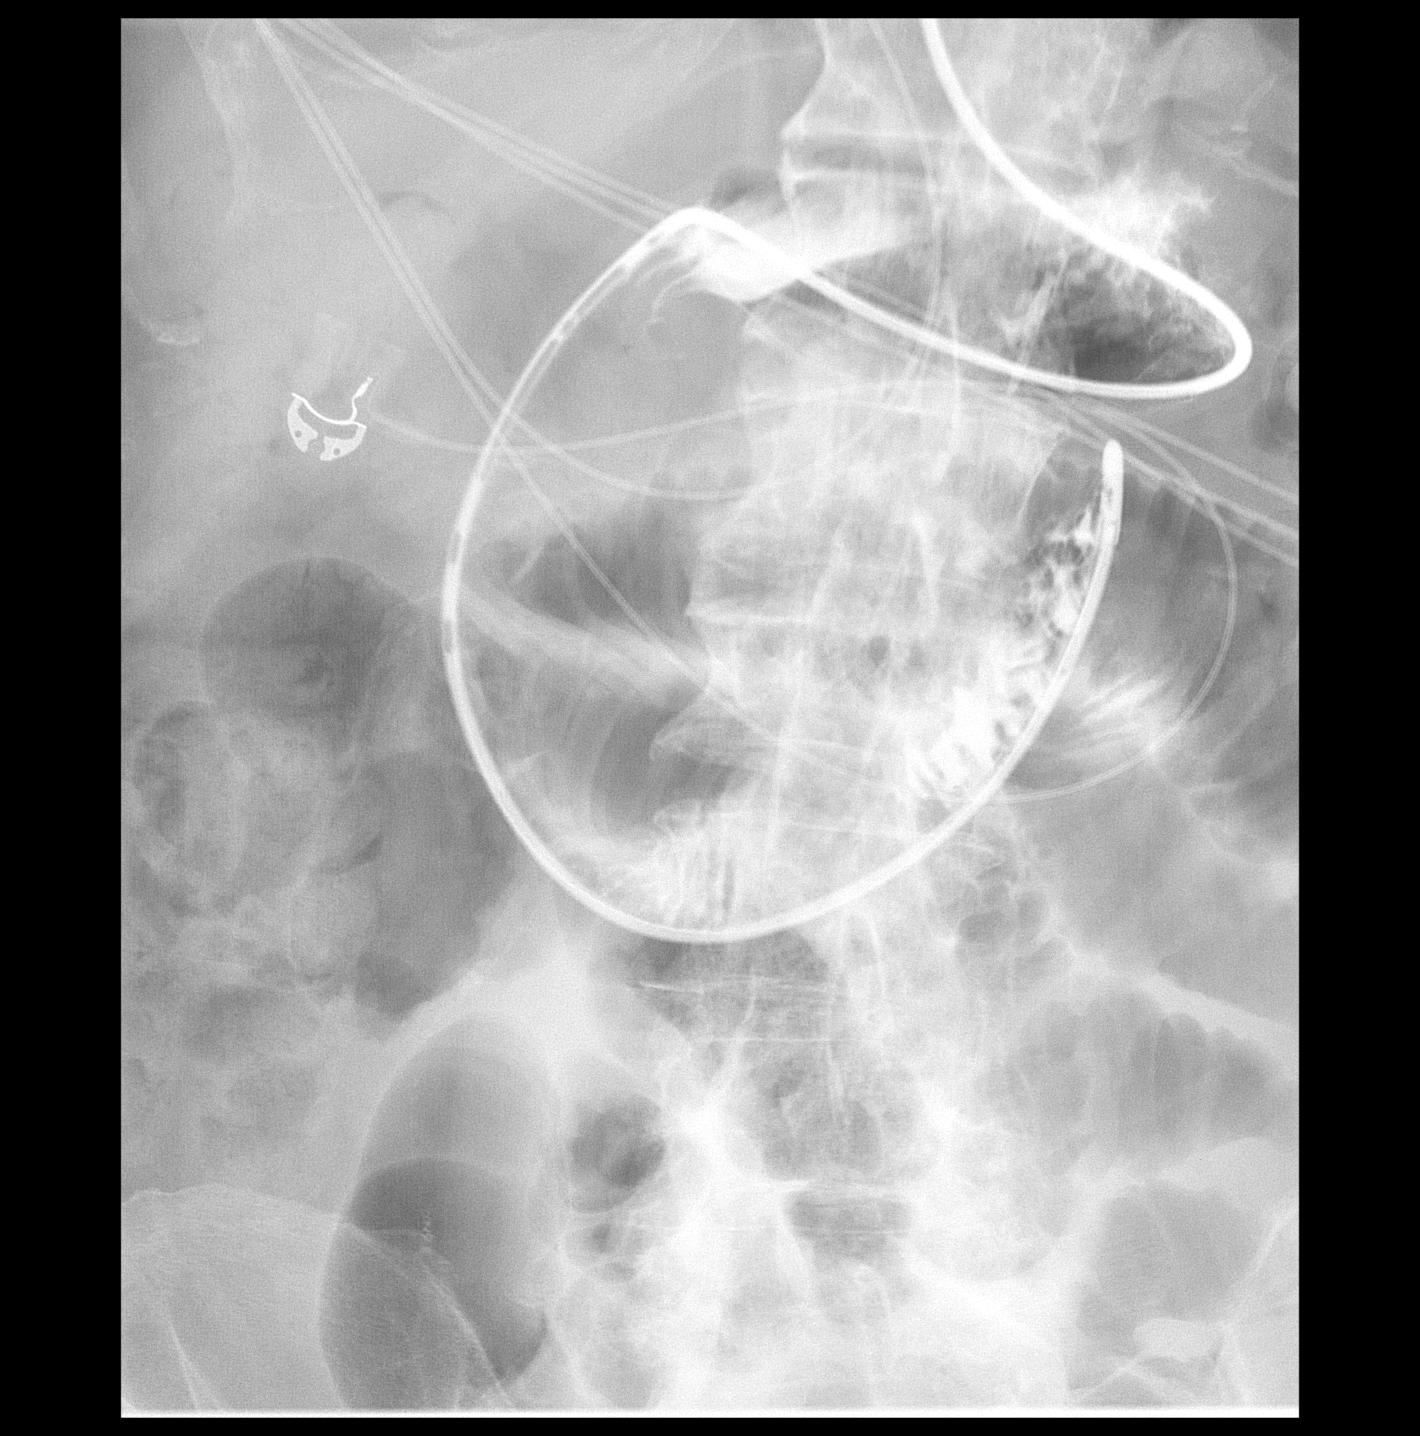

[2 of 2 positions shown; findings below may reference images not displayed]

FINDINGS: Under direct fluoroscopic guidance a weighted nasogastric tube was
placed into the right nares. The tube was then guided into the
esophagus, past the GE junction and into the gastric lumen. The tube
was then manipulated into the third portion of the duodenum. A small
volume of water-soluble contrast material was injected into the tube
confirming placement of the tube at the level of the duodenal
jejunal junction.
IMPRESSION: 1. Successful fluoroscopic guided weighted nasogastric tube
placement. The tip is at the level of the duodenal jejunal junction
and is ready for use.

## 2020-08-08 IMAGING — RF DG NASO G TUBE PLC W/FL W/RAD
2 series · 2 of 2 positions shown · IV contrast (agent unspecified)
Comparison: Earlier today

CLINICAL DATA: Were acquires nasogastric tube placement.

EXAM:
NASO G TUBE PLACEMENT WITH FL AND WITH RAD
CONTRAST:  Water-soluble contrast
FLUOROSCOPY TIME:  Fluoroscopy Time:  1 minutes 12 seconds
Radiation Exposure Index (if provided by the fluoroscopic device):
Number of Acquired Spot Images: 2

[Series 6: fluoro_iodine_singleshot_bw · 0.17mm/px · 1 of 1 slices shown (1 of 2)]
[im 1/1]
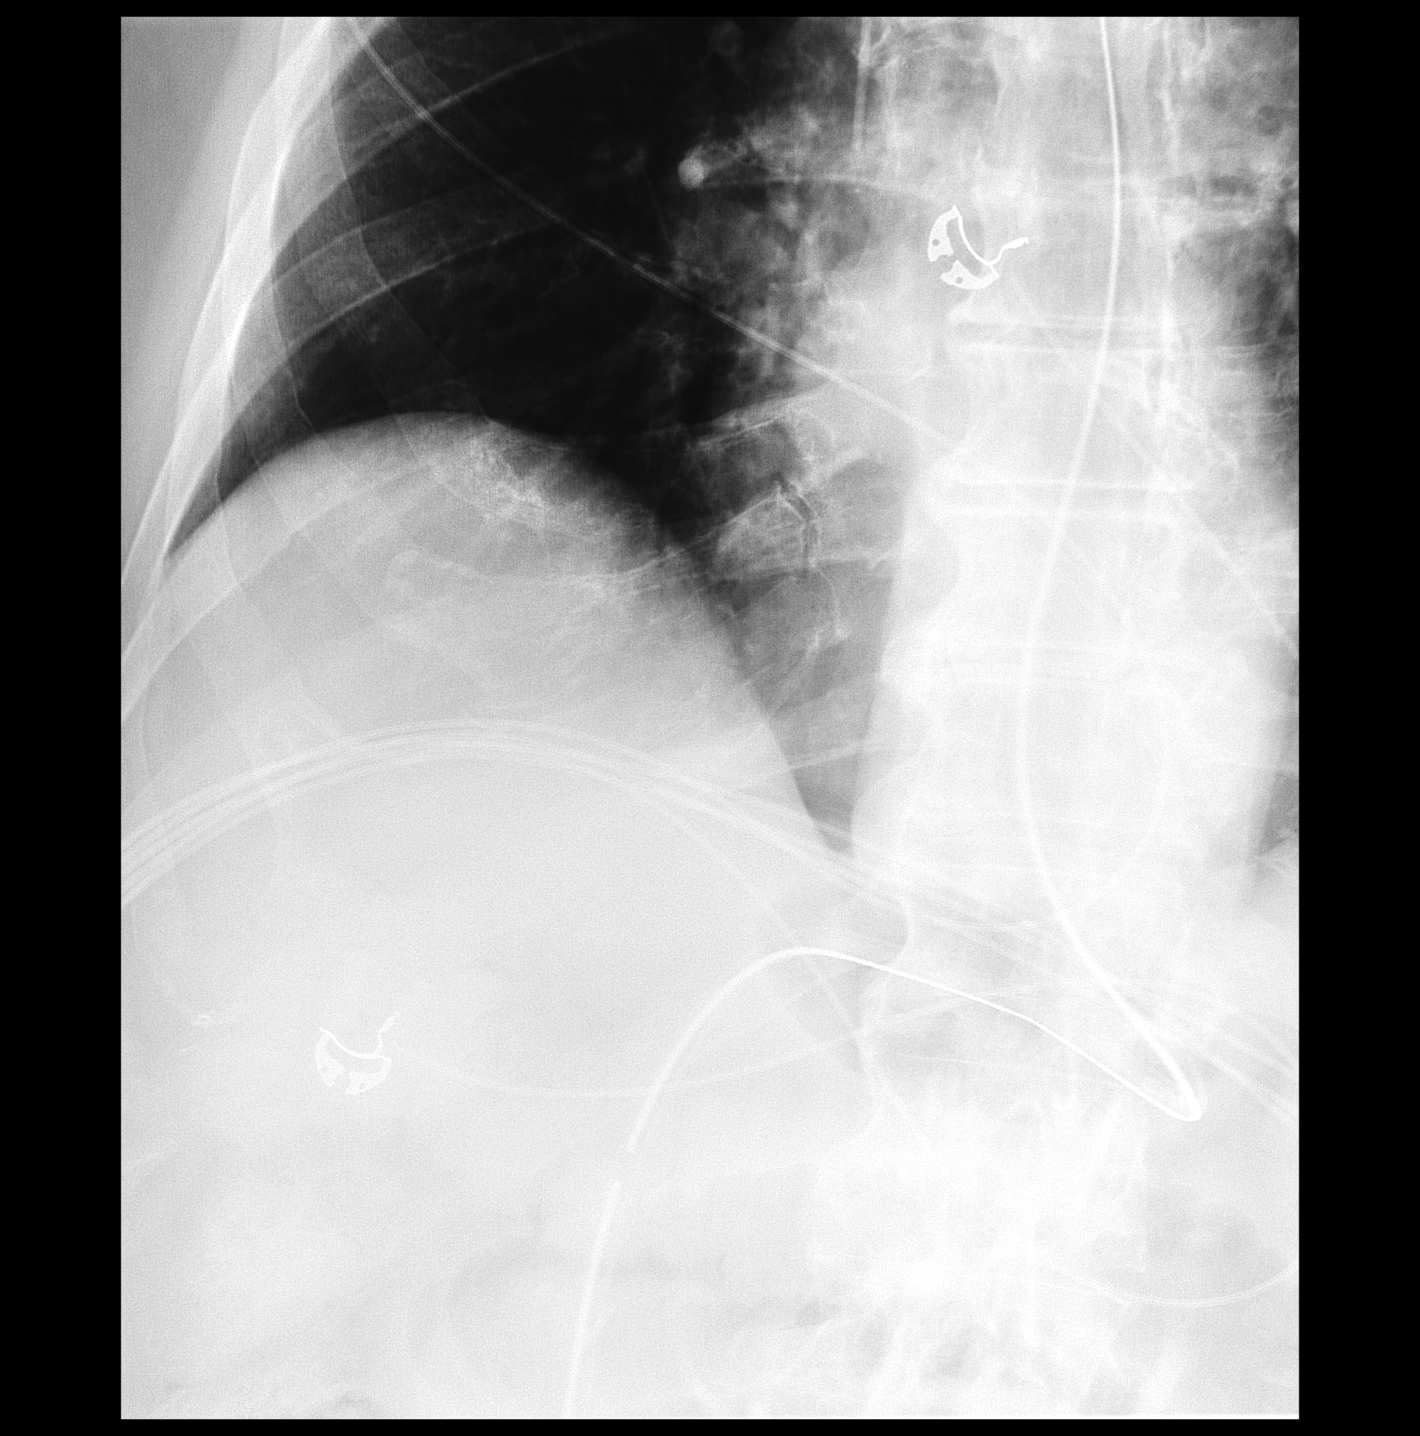

[Series 7: fluoro_iodine_singleshot_bw · 0.17mm/px · 1 of 1 slices shown (2 of 2)]
[im 1/1]
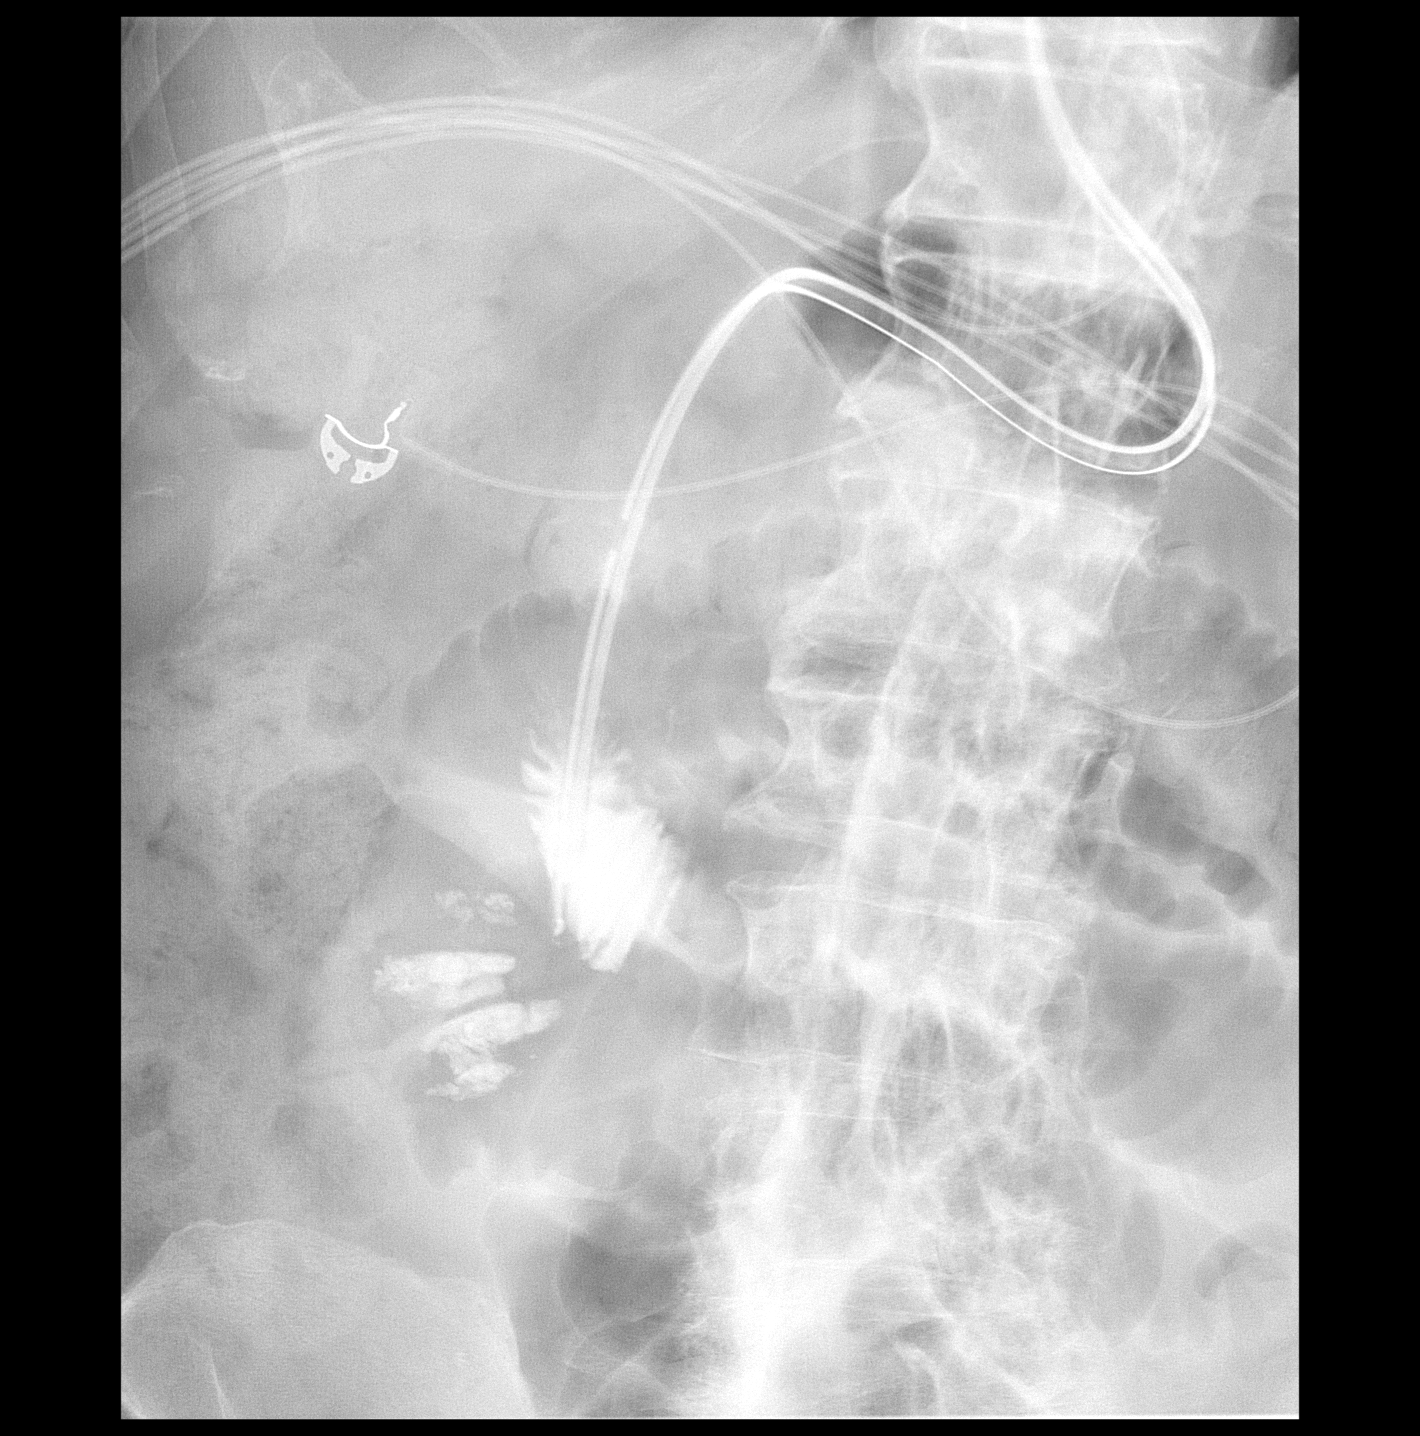

[2 of 2 positions shown; findings below may reference images not displayed]

FINDINGS: Patient's indwelling weighted feeding tube was removed. A
nasogastric tube was then placed through the patient's right nares
and under direct fluoroscopic guidance was manipulated into the
stomach. The tube was in extended into the duodenum. A small volume
of water-soluble contrast material was injected into the nasogastric
tube in the tip terminates at the distal portion of the descending
duodenum.
IMPRESSION: Removal of indwelling weighted feeding tube with subsequent
fluoroscopic guided placement of nasogastric tube into the proximal
duodenum. Contrast injected into the nasogastric tube confirms tip
location at the distal portion of the descending duodenum.

## 2020-08-09 IMAGING — DX DG ABDOMEN 1V
2 series · 2 of 2 positions shown · non-contrast
Comparison: Abdominal radiographic series-09/02/2018;

CLINICAL DATA: Small-bowel obstruction.  NG tube.

EXAM:
ABDOMEN - 1 VIEW

[abdomen kub (1 of 2)]
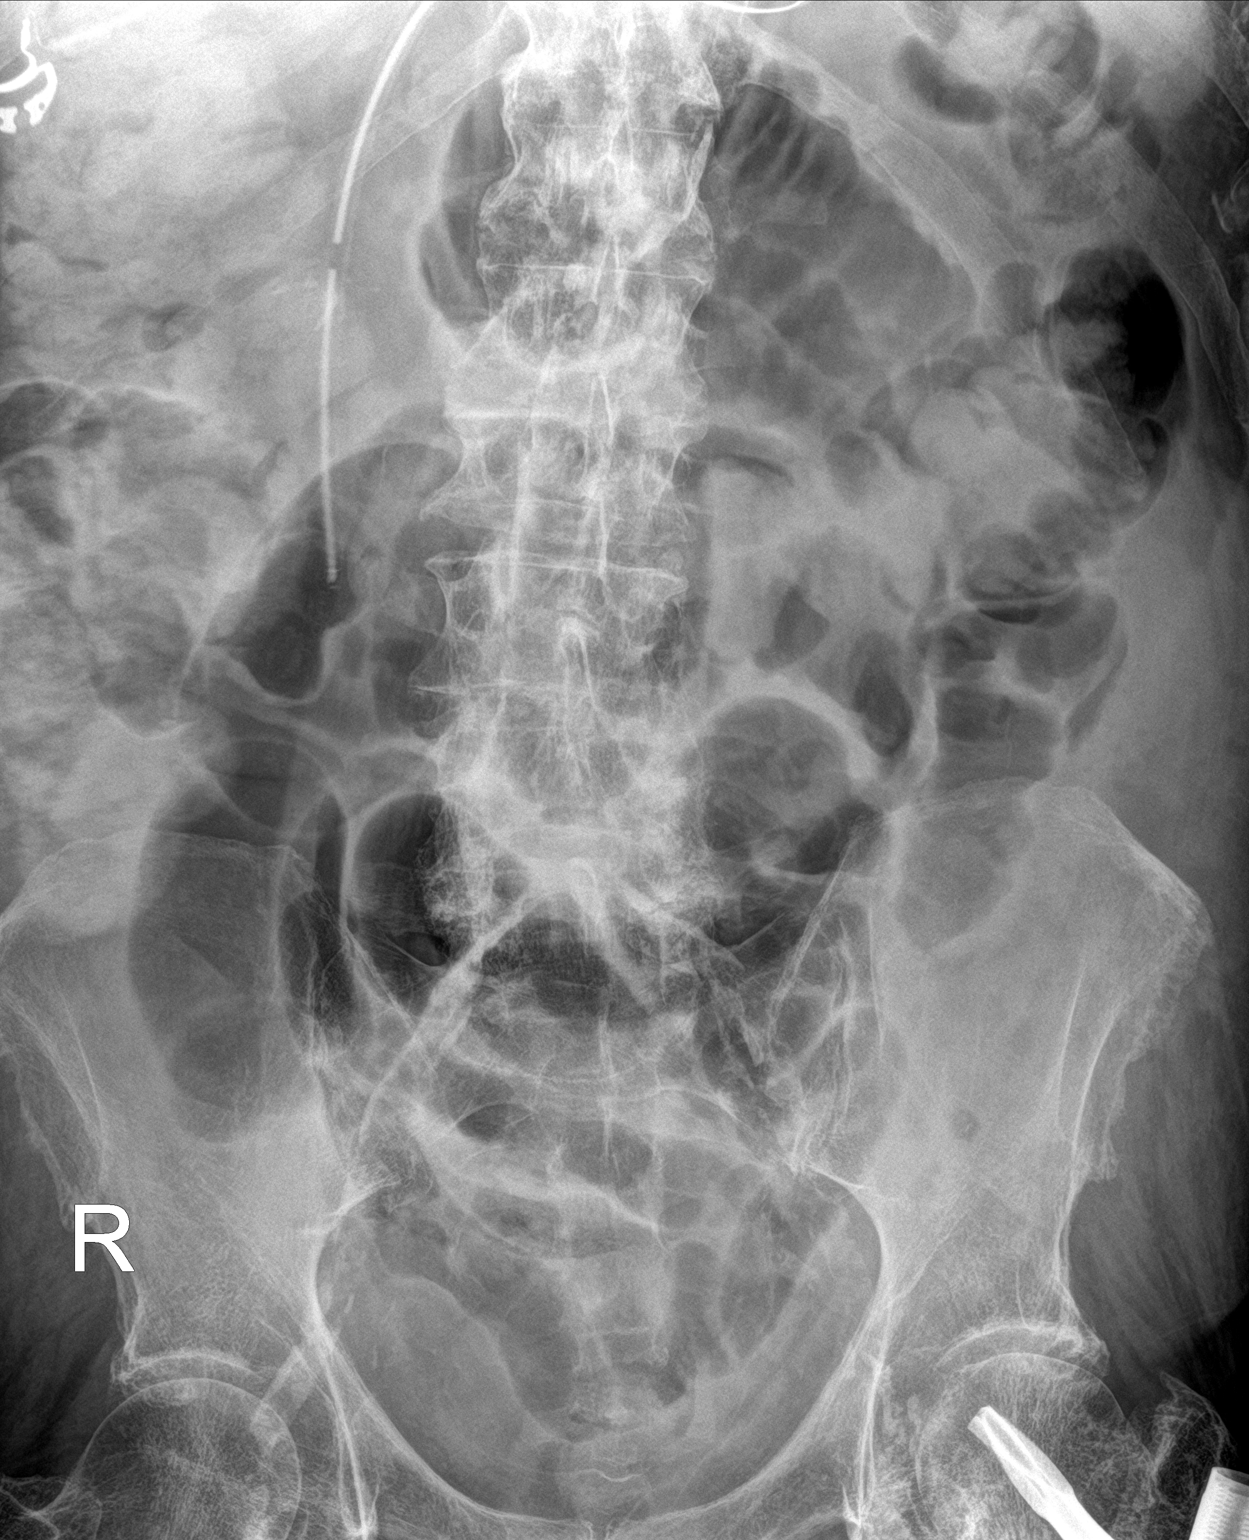

[abdomen kub (2 of 2)]
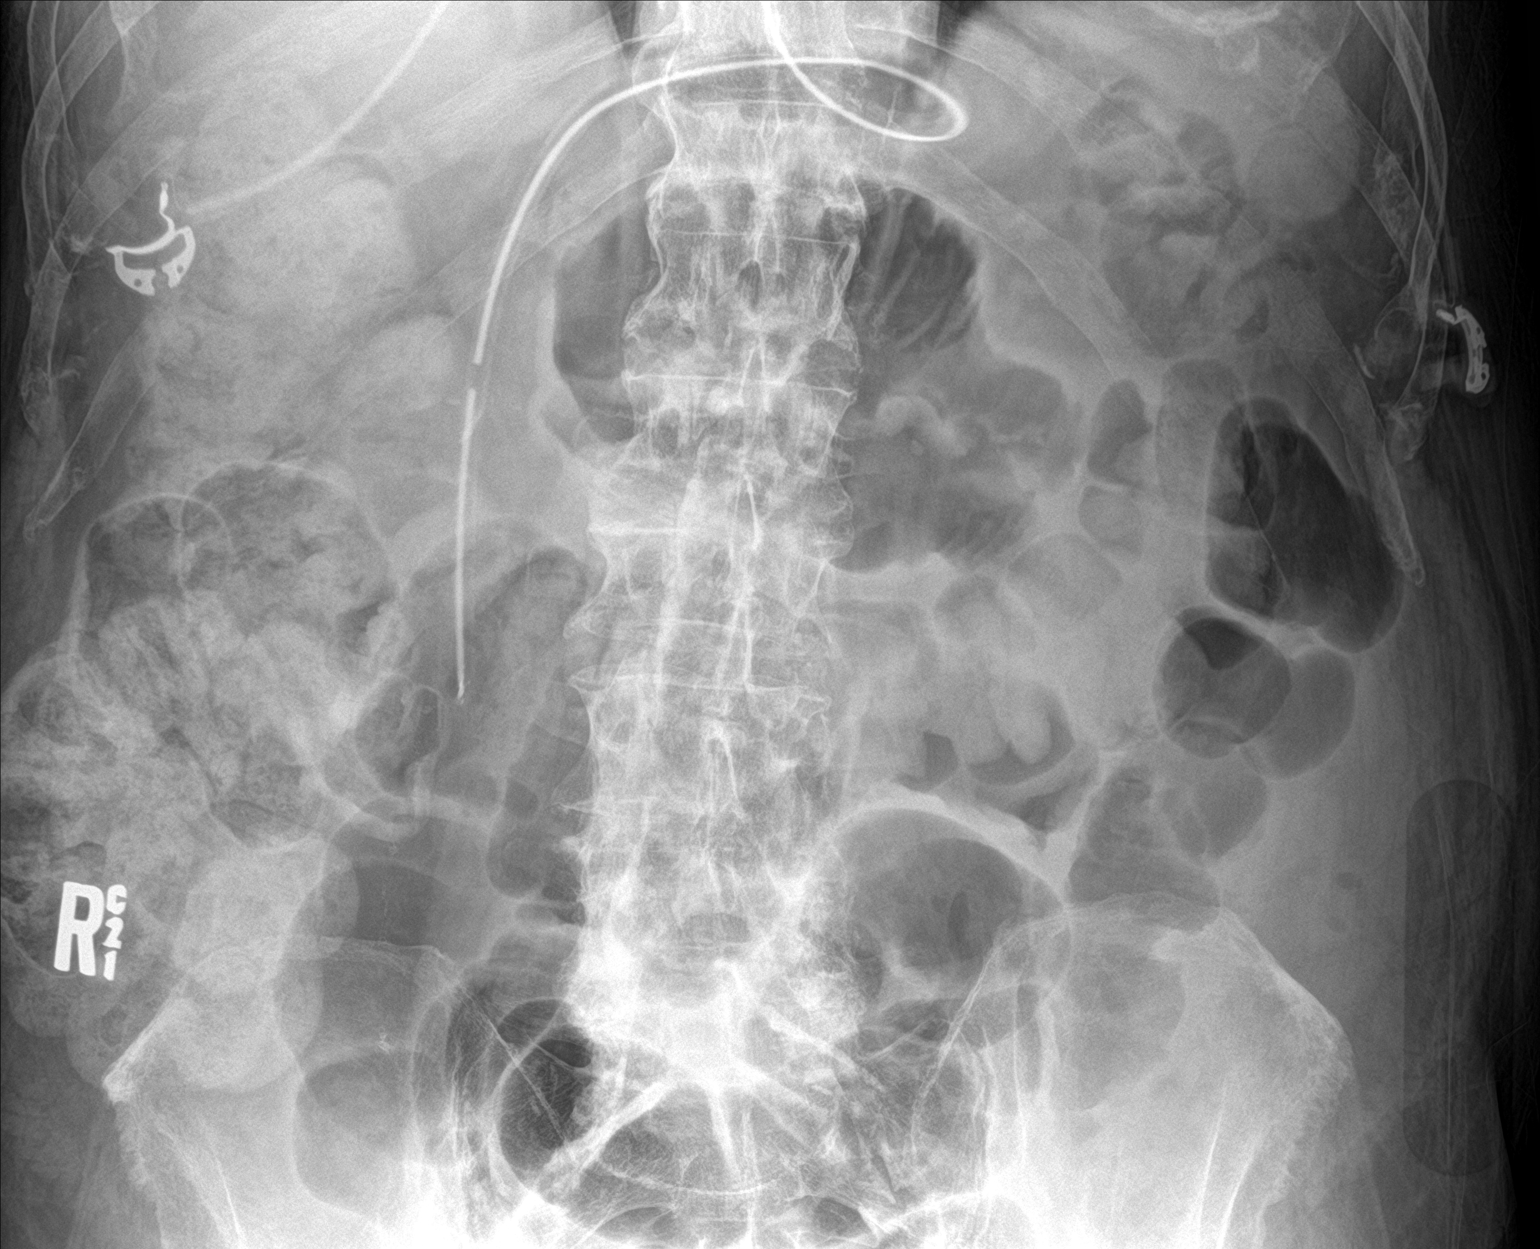

[2 of 2 positions shown; findings below may reference images not displayed]

09/01/2018;
08/29/2018; CT abdomen pelvis-[DATE]/fluoroscopic guided nasogastric
tube placement-09/02/2018
FINDINGS: There is been apparent exchange of existing weighted tip feeding
tube with new enteric tube tip overlying the expected location of
the descending portion of the duodenum.

Re demonstrated moderate gaseous distension of several rather
patulous loops of small bowel, unchanged to slightly improved
compared to the 09/02/2018 examination with index loop of small
bowel in the right mid/lower abdomen measuring 5.6 cm in diameter.

Nondiagnostic evaluation for pneumoperitoneum secondary to supine
positioning exclusion of the lower thorax.

Stigmata of DISH throughout the thoracic spine. Post ORIF of the
left femur and femoral neck.
IMPRESSION: 1. No change to slight improvement in findings again concerning for
small bowel obstruction.
2. Apparent change of enteric tube with new enteric tube tip
terminating over the expected location of the descending portion
duodenum.

## 2020-08-10 IMAGING — DX DG ABD PORTABLE 1V
1 series · 1 of 1 positions shown · non-contrast
Comparison: 09/03/2018

CLINICAL DATA: Follow up small bowel obstruction

EXAM:
PORTABLE ABDOMEN - 1 VIEW

[abdomen]
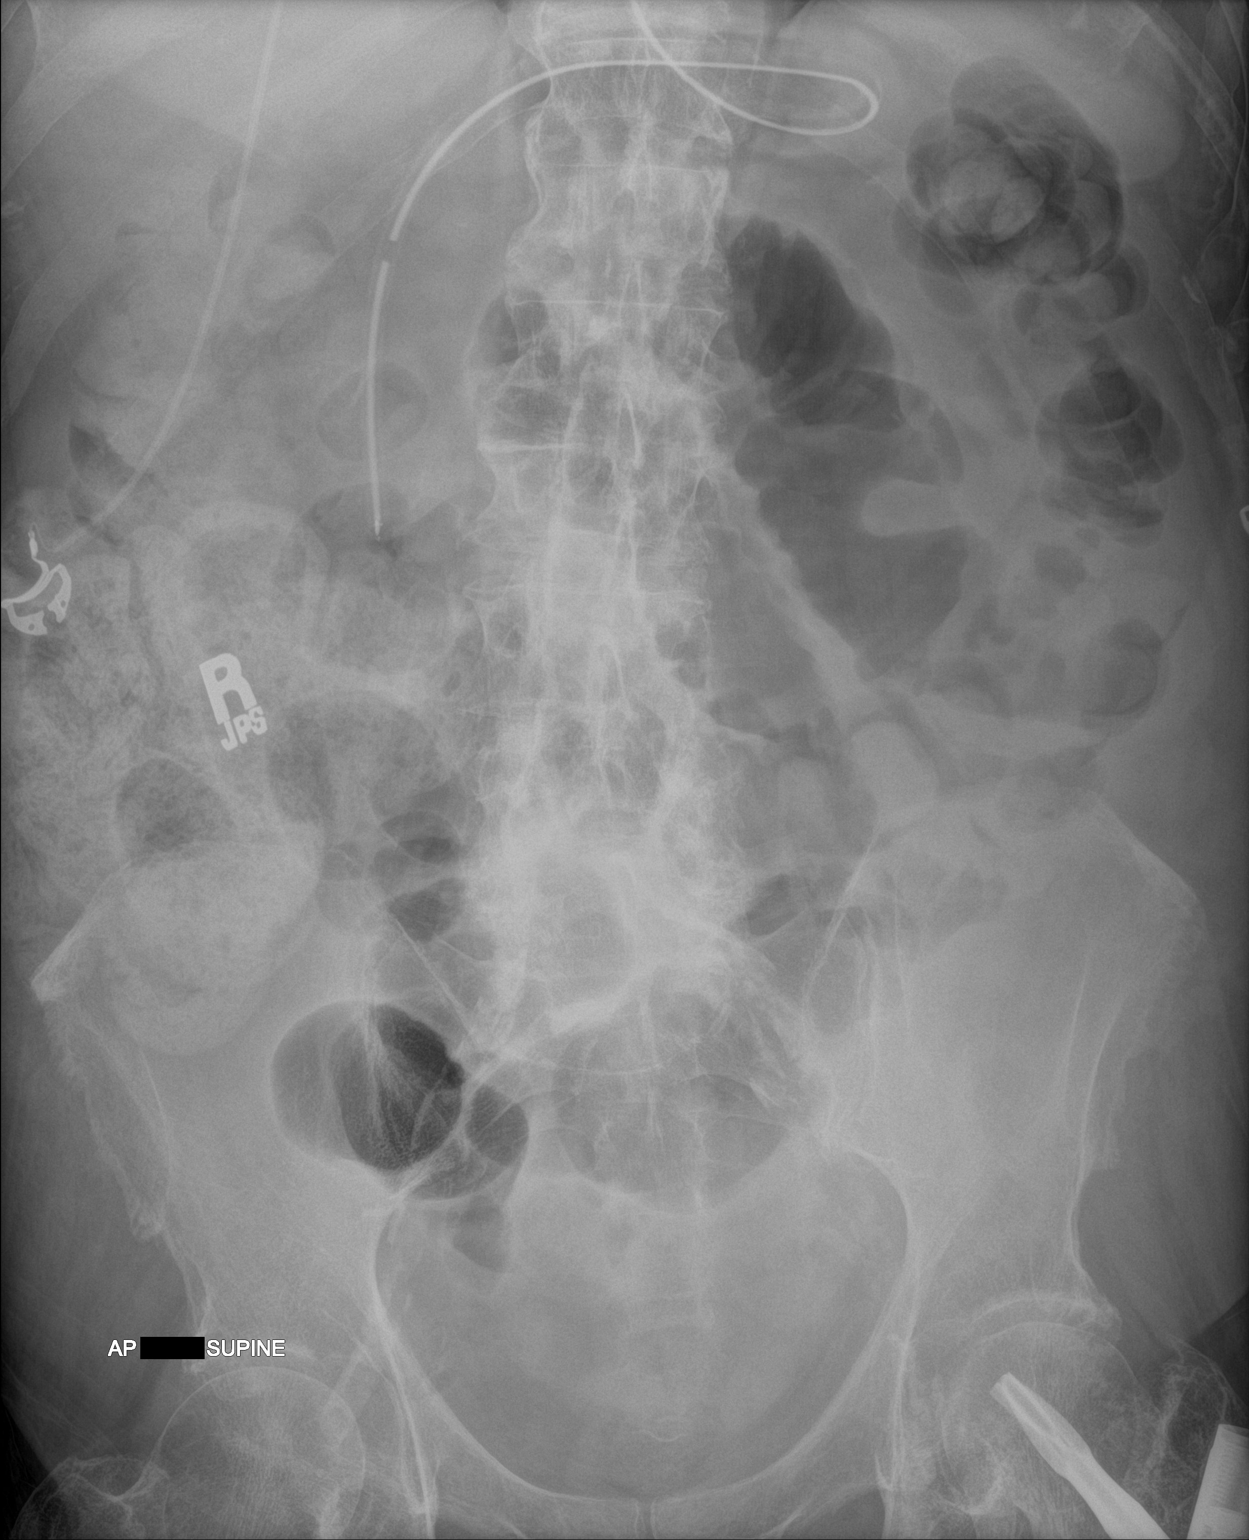

[1 of 1 positions shown; findings below may reference images not displayed]

FINDINGS: Nasogastric catheter is noted in the second portion of the duodenum
stable from the prior exam. Scattered large and small bowel gas is
noted. There is been a slight decrease in the degree of small bowel
dilatation when compared with the previous exam. No free air is
noted. Postsurgical changes in the left hip are again seen.
IMPRESSION: Improvement in the degree of small bowel dilatation when compared
with the previous day. Continued follow-up is recommended.
